# Patient Record
Sex: Female | Born: 1975 | Race: Black or African American | Hispanic: No | Marital: Married | Smoking: Current every day smoker
Health system: Southern US, Community
[De-identification: ages and names within clinical notes are randomized; demographics above are authoritative.]

## PROBLEM LIST (undated history)

## (undated) DIAGNOSIS — M549 Dorsalgia, unspecified: Secondary | ICD-10-CM

## (undated) DIAGNOSIS — N200 Calculus of kidney: Secondary | ICD-10-CM

## (undated) DIAGNOSIS — N189 Chronic kidney disease, unspecified: Secondary | ICD-10-CM

## (undated) DIAGNOSIS — D573 Sickle-cell trait: Secondary | ICD-10-CM

## (undated) DIAGNOSIS — I10 Essential (primary) hypertension: Secondary | ICD-10-CM

## (undated) DIAGNOSIS — F32A Depression, unspecified: Secondary | ICD-10-CM

## (undated) DIAGNOSIS — R7303 Prediabetes: Secondary | ICD-10-CM

## (undated) DIAGNOSIS — S73006A Unspecified dislocation of unspecified hip, initial encounter: Secondary | ICD-10-CM

## (undated) DIAGNOSIS — J45909 Unspecified asthma, uncomplicated: Secondary | ICD-10-CM

## (undated) DIAGNOSIS — K219 Gastro-esophageal reflux disease without esophagitis: Secondary | ICD-10-CM

## (undated) DIAGNOSIS — G43909 Migraine, unspecified, not intractable, without status migrainosus: Secondary | ICD-10-CM

## (undated) HISTORY — PX: OTHER SURGICAL HISTORY: SHX169

## (undated) HISTORY — DX: Calculus of kidney: N20.0

## (undated) HISTORY — PX: TUBAL LIGATION: SHX77

## (undated) HISTORY — DX: Gastro-esophageal reflux disease without esophagitis: K21.9

## (undated) HISTORY — DX: Migraine, unspecified, not intractable, without status migrainosus: G43.909

## (undated) HISTORY — DX: Sickle-cell trait: D57.3

---

## 2004-12-28 ENCOUNTER — Emergency Department: Payer: Self-pay | Admitting: Emergency Medicine

## 2005-01-25 ENCOUNTER — Emergency Department: Payer: Self-pay | Admitting: Emergency Medicine

## 2005-02-09 ENCOUNTER — Emergency Department: Payer: Self-pay | Admitting: Emergency Medicine

## 2005-03-09 ENCOUNTER — Emergency Department: Payer: Self-pay | Admitting: Emergency Medicine

## 2005-05-22 ENCOUNTER — Emergency Department: Payer: Self-pay | Admitting: Emergency Medicine

## 2005-07-27 ENCOUNTER — Emergency Department: Payer: Self-pay | Admitting: Emergency Medicine

## 2005-09-10 ENCOUNTER — Emergency Department: Payer: Self-pay | Admitting: Emergency Medicine

## 2005-09-25 ENCOUNTER — Emergency Department: Payer: Self-pay | Admitting: Emergency Medicine

## 2005-10-25 ENCOUNTER — Emergency Department: Payer: Self-pay | Admitting: Emergency Medicine

## 2005-12-05 ENCOUNTER — Other Ambulatory Visit: Payer: Self-pay

## 2005-12-05 ENCOUNTER — Emergency Department: Payer: Self-pay | Admitting: General Practice

## 2006-04-28 ENCOUNTER — Emergency Department: Payer: Self-pay | Admitting: Emergency Medicine

## 2006-08-16 ENCOUNTER — Emergency Department: Payer: Self-pay | Admitting: Emergency Medicine

## 2006-08-17 ENCOUNTER — Emergency Department: Payer: Self-pay | Admitting: Emergency Medicine

## 2007-05-03 ENCOUNTER — Emergency Department: Payer: Self-pay | Admitting: Emergency Medicine

## 2007-09-20 ENCOUNTER — Emergency Department: Payer: Self-pay | Admitting: Emergency Medicine

## 2008-01-28 ENCOUNTER — Emergency Department: Payer: Self-pay | Admitting: Emergency Medicine

## 2008-10-15 ENCOUNTER — Emergency Department: Payer: Self-pay | Admitting: Emergency Medicine

## 2008-10-28 ENCOUNTER — Observation Stay: Payer: Self-pay | Admitting: Internal Medicine

## 2009-07-29 ENCOUNTER — Ambulatory Visit: Payer: Self-pay | Admitting: Oncology

## 2009-12-01 ENCOUNTER — Emergency Department: Payer: Self-pay | Admitting: Unknown Physician Specialty

## 2010-07-24 ENCOUNTER — Emergency Department: Payer: Self-pay | Admitting: Internal Medicine

## 2010-09-11 ENCOUNTER — Emergency Department: Payer: Self-pay | Admitting: Emergency Medicine

## 2012-04-20 ENCOUNTER — Emergency Department: Payer: Self-pay | Admitting: Emergency Medicine

## 2012-04-20 LAB — URINALYSIS, COMPLETE
Bacteria: NONE SEEN
Blood: NEGATIVE
Glucose,UR: NEGATIVE mg/dL (ref 0–75)
Leukocyte Esterase: NEGATIVE
Nitrite: NEGATIVE
Ph: 5 (ref 4.5–8.0)
Specific Gravity: 1.02 (ref 1.003–1.030)
Squamous Epithelial: 5

## 2013-06-16 ENCOUNTER — Emergency Department: Payer: Self-pay | Admitting: Emergency Medicine

## 2014-06-27 ENCOUNTER — Emergency Department (HOSPITAL_COMMUNITY): Payer: Self-pay

## 2014-06-27 ENCOUNTER — Emergency Department (HOSPITAL_COMMUNITY)
Admission: EM | Admit: 2014-06-27 | Discharge: 2014-06-27 | Disposition: A | Discharge status: Home / Self Care | Payer: Self-pay | Attending: Emergency Medicine | Admitting: Emergency Medicine

## 2014-06-27 ENCOUNTER — Encounter (HOSPITAL_COMMUNITY): Payer: Self-pay | Admitting: *Deleted

## 2014-06-27 DIAGNOSIS — R0602 Shortness of breath: Secondary | ICD-10-CM

## 2014-06-27 DIAGNOSIS — I10 Essential (primary) hypertension: Secondary | ICD-10-CM | POA: Insufficient documentation

## 2014-06-27 DIAGNOSIS — M791 Myalgia: Secondary | ICD-10-CM | POA: Insufficient documentation

## 2014-06-27 DIAGNOSIS — Z720 Tobacco use: Secondary | ICD-10-CM | POA: Insufficient documentation

## 2014-06-27 DIAGNOSIS — Z7951 Long term (current) use of inhaled steroids: Secondary | ICD-10-CM | POA: Insufficient documentation

## 2014-06-27 DIAGNOSIS — Z3202 Encounter for pregnancy test, result negative: Secondary | ICD-10-CM | POA: Insufficient documentation

## 2014-06-27 DIAGNOSIS — M7918 Myalgia, other site: Secondary | ICD-10-CM

## 2014-06-27 DIAGNOSIS — Z91148 Patient's other noncompliance with medication regimen for other reason: Secondary | ICD-10-CM

## 2014-06-27 DIAGNOSIS — Z9114 Patient's other noncompliance with medication regimen: Secondary | ICD-10-CM | POA: Insufficient documentation

## 2014-06-27 DIAGNOSIS — J45901 Unspecified asthma with (acute) exacerbation: Secondary | ICD-10-CM | POA: Insufficient documentation

## 2014-06-27 DIAGNOSIS — Z79899 Other long term (current) drug therapy: Secondary | ICD-10-CM | POA: Insufficient documentation

## 2014-06-27 DIAGNOSIS — Z791 Long term (current) use of non-steroidal anti-inflammatories (NSAID): Secondary | ICD-10-CM | POA: Insufficient documentation

## 2014-06-27 HISTORY — DX: Unspecified asthma, uncomplicated: J45.909

## 2014-06-27 HISTORY — DX: Essential (primary) hypertension: I10

## 2014-06-27 LAB — BRAIN NATRIURETIC PEPTIDE: B Natriuretic Peptide: 40.3 pg/mL (ref 0.0–100.0)

## 2014-06-27 LAB — CBC
HEMATOCRIT: 29.9 % — AB (ref 36.0–46.0)
Hemoglobin: 8.3 g/dL — ABNORMAL LOW (ref 12.0–15.0)
MCH: 16.8 pg — ABNORMAL LOW (ref 26.0–34.0)
MCHC: 27.8 g/dL — ABNORMAL LOW (ref 30.0–36.0)
MCV: 60.6 fL — AB (ref 78.0–100.0)
PLATELETS: 487 10*3/uL — AB (ref 150–400)
RBC: 4.93 MIL/uL (ref 3.87–5.11)
RDW: 20.2 % — AB (ref 11.5–15.5)
WBC: 10.8 10*3/uL — AB (ref 4.0–10.5)

## 2014-06-27 LAB — BASIC METABOLIC PANEL
Anion gap: 7 (ref 5–15)
BUN: 13 mg/dL (ref 6–23)
CO2: 24 mmol/L (ref 19–32)
Calcium: 8.9 mg/dL (ref 8.4–10.5)
Chloride: 109 mmol/L (ref 96–112)
Creatinine, Ser: 0.8 mg/dL (ref 0.50–1.10)
GFR calc Af Amer: >90 mL/min (ref >90)
GFR calc non Af Amer: >90 mL/min (ref >90)
GLUCOSE: 112 mg/dL — AB (ref 70–99)
Potassium: 3.8 mmol/L (ref 3.5–5.1)
Sodium: 140 mmol/L (ref 135–145)

## 2014-06-27 LAB — POC URINE PREG, ED: PREG TEST UR: NEGATIVE

## 2014-06-27 LAB — I-STAT TROPONIN, ED: Troponin i, poc: 0 ng/mL (ref 0.00–0.08)

## 2014-06-27 MED ORDER — HYDROCODONE-ACETAMINOPHEN 5-325 MG PO TABS: 1.0000 | ORAL_TABLET | ORAL | Status: DC | PRN

## 2014-06-27 MED ORDER — ALBUTEROL SULFATE (2.5 MG/3ML) 0.083% IN NEBU: 2.5000 mg | INHALATION_SOLUTION | Freq: Four times a day (QID) | RESPIRATORY_TRACT | Status: DC | PRN

## 2014-06-27 MED ORDER — AMLODIPINE BESYLATE 10 MG PO TABS: 10.0000 mg | ORAL_TABLET | Freq: Every day | ORAL | Status: DC

## 2014-06-27 MED ORDER — MONTELUKAST SODIUM 10 MG PO TABS: 10.0000 mg | ORAL_TABLET | Freq: Every day | ORAL | Status: DC

## 2014-06-27 MED ORDER — HYDROCODONE-ACETAMINOPHEN 5-325 MG PO TABS
1.0000 | ORAL_TABLET | Freq: Once | ORAL | Status: AC
  Administered 2014-06-27: 1 via ORAL
  Filled 2014-06-27: qty 1

## 2014-06-27 MED ORDER — LISINOPRIL 10 MG PO TABS: 10.0000 mg | ORAL_TABLET | Freq: Every day | ORAL | Status: DC

## 2014-06-27 NOTE — Discharge Instructions (Signed)
There is no clear cause, for your pain. You may have a form of arthritis. This type of pain usually gets better with things like ibuprofen or Naprosyn. You may need further testing by a primary care doctor to determine the cause of your pain. You can also use our resource guide, to help you find a doctor.   Musculoskeletal Pain Musculoskeletal pain is muscle and boney aches and pains. These pains can occur in any part of the body. Your caregiver may treat you without knowing the cause of the pain. They may treat you if blood or urine tests, X-rays, and other tests were normal.  CAUSES There is often not a definite cause or reason for these pains. These pains may be caused by a type of germ (virus). The discomfort may also come from overuse. Overuse includes working out too hard when your body is not fit. Boney aches also come from weather changes. Bone is sensitive to atmospheric pressure changes. HOME CARE INSTRUCTIONS   Ask when your test results will be ready. Make sure you get your test results.  Only take over-the-counter or prescription medicines for pain, discomfort, or fever as directed by your caregiver. If you were given medications for your condition, do not drive, operate machinery or power tools, or sign legal documents for 24 hours. Do not drink alcohol. Do not take sleeping pills or other medications that may interfere with treatment.  Continue all activities unless the activities cause more pain. When the pain lessens, slowly resume normal activities. Gradually increase the intensity and duration of the activities or exercise.  During periods of severe pain, bed rest may be helpful. Lay or sit in any position that is comfortable.  Putting ice on the injured area.  Put ice in a bag.  Place a towel between your skin and the bag.  Leave the ice on for 15 to 20 minutes, 3 to 4 times a day.  Follow up with your caregiver for continued problems and no reason can be found for the  pain. If the pain becomes worse or does not go away, it may be necessary to repeat tests or do additional testing. Your caregiver may need to look further for a possible cause. SEEK IMMEDIATE MEDICAL CARE IF:  You have pain that is getting worse and is not relieved by medications.  You develop chest pain that is associated with shortness or breath, sweating, feeling sick to your stomach (nauseous), or throw up (vomit).  Your pain becomes localized to the abdomen.  You develop any new symptoms that seem different or that concern you. MAKE SURE YOU:   Understand these instructions.  Will watch your condition.  Will get help right away if you are not doing well or get worse. Document Released: 03/15/2005 Document Revised: 06/07/2011 Document Reviewed: 11/17/2012 [facility] Patient Information 2015 [place], [place]. This information is not intended to replace advice given to you by your health care provider. Make sure you discuss any questions you have with your health care provider.    Emergency Department Resource Guide 1) Find a Doctor and Pay Out of Pocket Although you won't have to find out who is covered by your insurance plan, it is a good idea to ask around and get recommendations. You will then need to call the office and see if the doctor you have chosen will accept you as a new patient and what types of options they offer for patients who are self-pay. Some doctors offer discounts or will set  up payment plans for their patients who do not have insurance, but you will need to ask so you aren't surprised when you get to your appointment.  2) Contact Your Local Health Department Not all health departments have doctors that can see patients for sick visits, but many do, so it is worth a call to see if yours does. If you don't know where your local health department is, you can check in your phone book. The CDC also has a tool to help you locate your state's health department, and many  state websites also have listings of all of their local health departments.  3) Find a [facility] If your illness is not likely to be very severe or complicated, you may want to try a walk in clinic. These are popping up all over the country in pharmacies, drugstores, and shopping centers. They're usually staffed by nurse practitioners or physician assistants that have been trained to treat common illnesses and complaints. They're usually fairly quick and inexpensive. However, if you have serious medical issues or chronic medical problems, these are probably not your best option.  No Primary Care Doctor: - Call Health Connect at  [phone] - they can help you locate a primary care doctor that  accepts your insurance, provides certain services, etc. - Physician Referral Service- [phone]  Chronic Pain Problems: Organization         Address  Phone   Notes  [facility]  [phone] Patients need to be referred by their primary care doctor.   Medication Assistance: Organization         Address  Phone   Notes  [facility] Medication [facility] [address] [place], [place] [address] [phone] --Must be a resident of [facility] -- Must have NO insurance coverage whatsoever (no Medicaid/ Medicare, etc.) -- The pt. MUST have a primary care doctor that directs their care regularly and follows them in the community   MedAssist  [phone]   [employer]  [phone]    Agencies that provide inexpensive medical care: Organization         Address  Phone   Notes  [place]  [phone]   [facility]    [phone]   [facility] [place], [place] [address] [phone]   [place] [address], [place] [phone]   Planned Parenthood    [phone]   [facility]    [phone]   [place] and  [address], [place] Phone:  [phone], Fax:  [phone] Hours of Operation:  9 am - 6 pm, M-F.  Also accepts Medicaid/Medicare and self-pay.  [facility] [place] for [place] [place], [address], [place] Phone: [phone], Fax: [phone]. Hours of Operation:  8:30 am - 5:30 pm, M-F.  Also accepts Medicaid and self-pay.  [facility] [address], [place] Phone: [phone]   [place], [place], [place] [phone], Ext. [phone] Mondays & Thursdays: 7-9 AM.  First 15 patients are seen on a first come, first serve basis.    [place] Providers:  Organization         Address  Phone   Notes  [facility] [address], [place] [phone] Also  accepts self-pay patients.  [facility] [address], [place]  [phone]   [place], [address], [place] [phone]   [facility] [address], [place] [phone]   Lucianne Lei [address], [place]   [phone] Only accepts [place] Access [place] patients after they have their name applied to their card.   Self-Pay (no insurance) in [facility]:  Organization         Address  Phone   Notes  Sickle Cell Patients, [facility] [place] [phone]   [facility] [place] [phone]   [facility] [place]  [place], [place], [place] [phone]   [facility]/Dr. Osei-Bonsu  [address] [place] or [address], [place] [phone] Phone number for both [place] and [place] locations is the same.  Urgent Medical and [facility] [address], [place] [phone]   [facility] [address] [place] or [address] [phone]   [facility] [address], [place] [phone], phone; [phone], fax Sees patients 1st and 3rd Saturday of every month.  Must not qualify for public or private insurance (i.e. Medicaid, Medicare, [place] Health Choice, Veterans' Benefits)  Household income should be no more than 200% of the poverty level The clinic cannot treat you if you are pregnant or think you are pregnant  Sexually transmitted diseases are not treated at the clinic.    Dental Care: Organization         Address  Phone  Notes  [facility] Department of [facility] [phone] Accepts children up to age 56 who are enrolled in [place] or [place]; pregnant women with a Medicaid card; and children who have applied for Medicaid or [place] Health Choice, but were declined, whose parents can pay a reduced fee at time of service.  [facility] Department of [facility]  [address], [place] [phone] Accepts children up to age 46 who are enrolled in [place] or [place]; pregnant women with a Medicaid card; and children who have applied for Medicaid or [place] Health Choice, but were declined, whose parents can pay a reduced fee at time of service.  [facility] Access PRO[facility]  [place] [phone] Patients are seen by appointment only. Walk-ins are not accepted. [place] will see patients 30 years of age and older. Monday - Tuesday (8am-5pm) Most Wednesdays (8:30-5pm) $30 per visit, cash only  [facility] Adult Dental Access PROGRAM  [address], [facility] [phone] Patients are seen by appointment only. Walk-ins are not accepted. [place] will see patients 46 years of age and older. One Wednesday Evening (Monthly: Volunteer Based).  $30 per visit, cash only  [place]  [phone] for adults; Children under age 37, call Graduate Pediatric Dentistry at [phone]. Children aged 59-14, please call [phone] to request a pediatric application.  Dental services are provided in all areas of dental care including fillings, crowns and bridges, complete and partial dentures, implants, gum treatment, root canals, and extractions. Preventive care is also provided. Treatment is  provided to both adults and children. Patients are selected via a lottery and there is often a waiting list.   [facility] [address], [place]  [phone] [email or web address]   [facility] [address] [employer], [place] [phone], Ext. [phone] Second and Fourth Thursday of each month, opens at 6:30 AM; Clinic ends at 9 AM.  Patients are seen on a first-come first-served basis, and a limited number are seen during each clinic.   [facility]  [address] Hillard Danker [place], [place] [phone]   Eligibility Requirements You must have lived in [place], [place], or [place] counties for at least the last three months.   You cannot be eligible for state or federal sponsored [employer], including [employer], [place], or [employer].   You generally cannot be eligible for healthcare insurance through your employer.    How to apply: Eligibility screenings are held every Tuesday and Wednesday afternoon from 1:00 pm until 4:00 pm. You do not need an appointment for the interview!  [facility] [address] [place], [place]   [place]  [place] Department  [address]  [phone]    Behavioral Health Resources in the Community: Intensive Outpatient Programs Organization         Address  Phone  Notes  [place]. [address], [place], [place]  [phone]   [facility] Outpatient [address] [place], [place]   ADS: Alcohol & Drug Svcs [address] [place], [place]   [place] [address]  [place], [place] or [phone]   Substance Abuse Resources Organization         Address  Phone  Notes  Alcohol and Drug Services  [phone]   [place]  [phone]   [facility] [place]   Chinita Pester  [phone]   Residential & Outpatient Substance Abuse Program  [phone]   Psychological Services Organization         Address  Phone  Notes  [facility]  [place]  [phone]   [place] [address] [place] or [phone]    Mobile Crisis Teams Organization         Address  Phone  Notes  [facility], Mobile Crisis Care Unit  [phone]   [facility]  [address] [place], [place]   [place] [address], [place] [phone]    Self-Help/Support Groups Organization         Address  Phone             Notes  [place]. of [place] - variety of support groups  [place] Call for more information  Narcotics Anonymous (NA), [facility] [address], [employer] [place]  2 meetings at this location   Special educational needs teacher         Address  Phone  Notes  ASAP Residential Treatment [place],    [place]  [phone]   [facility]  [address], [place] [phone], [place], [place]   [place], [place] [phone] Admissions: 8am-3pm M-F  Incentives Substance [place] [address],    [place], [place] [phone]  [facility] [address] Jadene Pierini [place], [place]   The [place].,    [place], [place]   [facility] - Intensive Outpatient [address], [place], [place]   [facility] ([place] [address]  [place], [place] [phone] or [phone]   [facility]idential Treatment Services (RTS) [address], [place], [place] Accepts Medicaid  [facility] [place] [address]  [place] [phone] Substance Abuse/Addiction Treatment   [facility] Organization         Address  Phone  Notes  [facility]  [phone]   Domenic Schwab, PhD [address] Arlis Porta [place], [place]   [phone] or [phone]   [place], [place] [phone]   [facility] [address] [place], [place] [phone] Insurance/Medicaid/sponsorship through [facility] and Families [address], [address] [place]                                    [place], [place] [phone] [place] [address], [place] [phone]    Dr. Adele Schilder  [phone]   [facility] [facility] Dept. 1) [address] [place] 2) [place] 3)  [place] [address], [place] [phone]  [phone]   [place] [phone] or [phone] (After Hours)

## 2014-06-27 NOTE — ED Notes (Signed)
Pt is aware a urine sample is needed. Pt will notify when she is able to void

## 2014-06-27 NOTE — ED Provider Notes (Addendum)
CSN: [id number]     Arrival date & time 06/27/14  1225 History   First MD Initiated Contact with Patient 06/27/14 1505     Chief Complaint  Patient presents with  . Chest Pain  . Shortness of Breath     (Consider location/radiation/quality/duration/timing/severity/associated sxs/prior Treatment) HPI   Kelly Byrd is a 39 y.o. female who is here for "several problems". She is out of multiple medications. She also has chronic ongoing pain in right knee, back, and headache. She denies recent fever, chills, nausea, vomiting, weakness or dizziness. She recently moved to [place], from [place], [place]. She does not have a primary care doctor currently. She is out of the following medications- albuterol nebulizer, amlodipine, Flonase, lisinopril, Singulair, naproxen.    Past Medical History  Diagnosis Date  . Asthma   . Hypertension    Past Surgical History  Procedure Laterality Date  . Cesarean section     No family history on file. History  Substance Use Topics  . Smoking status: Current Every Day Smoker    Types: Cigarettes  . Smokeless tobacco: Not on file  . Alcohol Use: No   OB History    No data available     Review of Systems  All other systems reviewed and are negative.     Allergies  Review of patient's allergies indicates no known allergies.  Home Medications   Prior to Admission medications   Medication Sig Start Date End Date Taking? Authorizing Provider  acetaminophen (TYLENOL) 650 MG CR tablet Take 650 mg by mouth every 8 (eight) hours as needed for pain (pain).   Yes Historical Provider, MD  albuterol (PROVENTIL HFA;VENTOLIN HFA) 108 (90 BASE) MCG/ACT inhaler Inhale 2 puffs into the lungs every 6 (six) hours as needed for wheezing or shortness of breath (wheezing).   Yes Historical Provider, MD  albuterol (PROVENTIL) (2.5 MG/3ML) 0.083% nebulizer solution Take 2.5 mg by nebulization every 6 (six) hours as needed for wheezing or  shortness of breath (wheezing).   Yes Historical Provider, MD  amLODipine (NORVASC) 10 MG tablet Take 10 mg by mouth daily.   Yes Historical Provider, MD  guaifenesin (ROBITUSSIN) 100 MG/5ML syrup Take 200 mg by mouth 3 (three) times daily as needed for cough (cough).   Yes Historical Provider, MD  ibuprofen (ADVIL,MOTRIN) 800 MG tablet Take 800 mg by mouth every 8 (eight) hours as needed for moderate pain (pain).   Yes Historical Provider, MD  lisinopril (PRINIVIL,ZESTRIL) 10 MG tablet Take 10 mg by mouth daily.   Yes Historical Provider, MD  mometasone-formoterol (DULERA) 200-5 MCG/ACT AERO Inhale 2 puffs into the lungs 2 (two) times daily.   Yes Historical Provider, MD  montelukast (SINGULAIR) 10 MG tablet Take 10 mg by mouth at bedtime.   Yes Historical Provider, MD  naproxen (NAPROSYN) 500 MG tablet Take 1,000 mg by mouth 4 (four) times daily as needed for moderate pain (pain).   Yes Historical Provider, MD   BP 128/81 mmHg  Pulse 93  Temp(Src) 98.2 F (36.8 C) (Oral)  Resp 20  Ht 5\' 4"  (1.626 m)  Wt 220 lb (99.791 kg)  BMI 37.74 kg/m2  SpO2 100%  LMP 06/24/2014 Physical Exam  Constitutional: She is oriented to person, place, and time. She appears well-developed and well-nourished.  HENT:  Head: Normocephalic and atraumatic.  Right Ear: External ear normal.  Left Ear: External ear normal.  Eyes: Conjunctivae and EOM are normal. Pupils are equal, round, and reactive to light.  Neck: Normal range of motion and phonation normal. Neck supple.  Cardiovascular: Normal rate, regular rhythm and normal heart sounds.   Pulmonary/Chest: Effort normal and breath sounds normal. She exhibits no bony tenderness.  Abdominal: Soft. There is no tenderness.  Musculoskeletal: Normal range of motion.  Neurological: She is alert and oriented to person, place, and time. No cranial nerve deficit or sensory deficit. She exhibits normal muscle tone. Coordination normal.  Skin: Skin is warm, dry and  intact.  Psychiatric: She has a normal mood and affect. Her behavior is normal. Judgment and thought content normal.  Nursing note and vitals reviewed.   ED Course  Procedures (including critical care time) Labs Review Labs Reviewed  BASIC METABOLIC PANEL - Abnormal; Notable for the following:    Glucose, Bld 112 (*)    All other components within normal limits  CBC - Abnormal; Notable for the following:    WBC 10.8 (*)    Hemoglobin 8.3 (*)    HCT 29.9 (*)    MCV 60.6 (*)    MCH 16.8 (*)    MCHC 27.8 (*)    RDW 20.2 (*)    Platelets 487 (*)    All other components within normal limits  BRAIN NATRIURETIC PEPTIDE  POC URINE PREG, ED  I-STAT TROPOININ, ED    Imaging Review Dg Chest 2 View  06/27/2014   CLINICAL DATA:  Left-sided chest, neck, and back pain for 3-4 days. Shortness of breath with activity. Productive cough.  EXAM: CHEST  2 VIEW  COMPARISON:  None.  FINDINGS: The heart size and mediastinal contours are within normal limits. Both lungs are clear. The visualized skeletal structures are unremarkable.  IMPRESSION: Normal chest.   Electronically Signed   By: Lorriane Shire M.D.   On: 06/27/2014 13:31     EKG Interpretation   Date/Time:  Thursday June 27 2014 12:41:33 EDT Ventricular Rate:  91 PR Interval:  157 QRS Duration: 79 QT Interval:  371 QTC Calculation: 456 R Axis:   52 Text Interpretation:  Sinus rhythm Consider left ventricular hypertrophy  ST elevation, consider inferior injury No old tracing to compare Confirmed  by [facility]  MD, Tyress Loden [phone]) on 06/27/2014 3:09:20 PM      MDM   Final diagnoses:  SOB (shortness of breath)    Nonspecific musculoskeletal pain. Doubt gout, fibromyalgia, serious bacterial infection, metabolic instability, or impending vascular collapse.  Nursing Notes Reviewed/ Care Coordinated Applicable Imaging Reviewed Interpretation of Laboratory Data incorporated into ED treatment  The patient appears reasonably screened  and/or stabilized for discharge and I doubt any other medical condition or other [facility] requiring further screening, evaluation, or treatment in the ED at this time prior to discharge.  Plan: Home Medications- see AVS; Home Treatments- rest; return here if the recommended treatment, does not improve the symptoms; Recommended follow up- PCP of choice asap      Daleen Bo, MD 06/27/14 [place], MD 07/04/14 2152

## 2014-06-27 NOTE — ED Notes (Signed)
Patient states that she has been having pain in the left side of her chest, neck, and back for the past 3-4 days. She states she has history of asthma and sometimes has pains in her chest but this pain is different in location. She states she has been having shortness of breath with activity and a productive cough this week. She denies radiation to the jaw or arms.

## 2014-09-28 ENCOUNTER — Emergency Department (HOSPITAL_COMMUNITY): Payer: Self-pay

## 2014-09-28 ENCOUNTER — Emergency Department (HOSPITAL_COMMUNITY)
Admission: EM | Admit: 2014-09-28 | Discharge: 2014-09-28 | Disposition: A | Discharge status: Home / Self Care | Payer: Self-pay | Attending: Emergency Medicine | Admitting: Emergency Medicine

## 2014-09-28 ENCOUNTER — Encounter (HOSPITAL_COMMUNITY): Payer: Self-pay | Admitting: Emergency Medicine

## 2014-09-28 DIAGNOSIS — Z720 Tobacco use: Secondary | ICD-10-CM | POA: Insufficient documentation

## 2014-09-28 DIAGNOSIS — I10 Essential (primary) hypertension: Secondary | ICD-10-CM | POA: Insufficient documentation

## 2014-09-28 DIAGNOSIS — R079 Chest pain, unspecified: Secondary | ICD-10-CM | POA: Insufficient documentation

## 2014-09-28 DIAGNOSIS — Z79899 Other long term (current) drug therapy: Secondary | ICD-10-CM | POA: Insufficient documentation

## 2014-09-28 DIAGNOSIS — J45909 Unspecified asthma, uncomplicated: Secondary | ICD-10-CM | POA: Insufficient documentation

## 2014-09-28 DIAGNOSIS — Z7951 Long term (current) use of inhaled steroids: Secondary | ICD-10-CM | POA: Insufficient documentation

## 2014-09-28 LAB — CBC WITH DIFFERENTIAL/PLATELET
Basophils Absolute: 0 10*3/uL (ref 0.0–0.1)
Basophils Relative: 0 % (ref 0–1)
EOS ABS: 0.6 10*3/uL (ref 0.0–0.7)
EOS PCT: 5 % (ref 0–5)
HCT: 28.8 % — ABNORMAL LOW (ref 36.0–46.0)
HEMOGLOBIN: 8 g/dL — AB (ref 12.0–15.0)
LYMPHS PCT: 23 % (ref 12–46)
Lymphs Abs: 2.6 10*3/uL (ref 0.7–4.0)
MCH: 16.8 pg — ABNORMAL LOW (ref 26.0–34.0)
MCHC: 27.8 g/dL — AB (ref 30.0–36.0)
MCV: 60.4 fL — ABNORMAL LOW (ref 78.0–100.0)
MONO ABS: 0.6 10*3/uL (ref 0.1–1.0)
MONOS PCT: 5 % (ref 3–12)
NEUTROS ABS: 7.5 10*3/uL (ref 1.7–7.7)
Neutrophils Relative %: 67 % (ref 43–77)
PLATELETS: 463 10*3/uL — AB (ref 150–400)
RBC: 4.77 MIL/uL (ref 3.87–5.11)
RDW: 19.3 % — AB (ref 11.5–15.5)
WBC: 11.3 10*3/uL — AB (ref 4.0–10.5)

## 2014-09-28 LAB — BASIC METABOLIC PANEL
ANION GAP: 7 (ref 5–15)
BUN: 8 mg/dL (ref 6–20)
CO2: 24 mmol/L (ref 22–32)
Calcium: 8.7 mg/dL — ABNORMAL LOW (ref 8.9–10.3)
Chloride: 106 mmol/L (ref 101–111)
Creatinine, Ser: 0.6 mg/dL (ref 0.44–1.00)
GFR calc non Af Amer: >60 mL/min (ref >60)
GLUCOSE: 87 mg/dL (ref 65–99)
POTASSIUM: 3.5 mmol/L (ref 3.5–5.1)
Sodium: 137 mmol/L (ref 135–145)

## 2014-09-28 LAB — TROPONIN I: Troponin I: <0.03 ng/mL (ref <0.031)

## 2014-09-28 MED ORDER — AZITHROMYCIN 250 MG PO TABS: ORAL_TABLET | ORAL | Status: DC

## 2014-09-28 MED ORDER — PREDNISONE 50 MG PO TABS: ORAL_TABLET | ORAL | Status: DC

## 2014-09-28 MED ORDER — IPRATROPIUM-ALBUTEROL 0.5-2.5 (3) MG/3ML IN SOLN
3.0000 mL | Freq: Once | RESPIRATORY_TRACT | Status: AC
  Administered 2014-09-28: 3 mL via RESPIRATORY_TRACT
  Filled 2014-09-28: qty 3

## 2014-09-28 MED ORDER — OXYCODONE-ACETAMINOPHEN 5-325 MG PO TABS: 1.0000 | ORAL_TABLET | Freq: Four times a day (QID) | ORAL | Status: DC | PRN

## 2014-09-28 MED ORDER — ALBUTEROL SULFATE (2.5 MG/3ML) 0.083% IN NEBU
2.5000 mg | INHALATION_SOLUTION | Freq: Once | RESPIRATORY_TRACT | Status: AC
  Administered 2014-09-28: 2.5 mg via RESPIRATORY_TRACT
  Filled 2014-09-28: qty 3

## 2014-09-28 MED ORDER — KETOROLAC TROMETHAMINE 30 MG/ML IJ SOLN
30.0000 mg | Freq: Once | INTRAMUSCULAR | Status: AC
  Administered 2014-09-28: 30 mg via INTRAVENOUS
  Filled 2014-09-28: qty 1

## 2014-09-28 MED ORDER — ONDANSETRON HCL 4 MG/2ML IJ SOLN
4.0000 mg | Freq: Once | INTRAMUSCULAR | Status: AC
  Administered 2014-09-28: 4 mg via INTRAVENOUS
  Filled 2014-09-28: qty 2

## 2014-09-28 MED ORDER — FENTANYL CITRATE (PF) 100 MCG/2ML IJ SOLN
100.0000 ug | Freq: Once | INTRAMUSCULAR | Status: AC
Start: 2014-09-28 — End: 2014-09-28
  Administered 2014-09-28: 100 ug via INTRAVENOUS
  Filled 2014-09-28: qty 2

## 2014-09-28 MED ORDER — SODIUM CHLORIDE 0.9 % IV BOLUS (SEPSIS)
500.0000 mL | Freq: Once | INTRAVENOUS | Status: AC
  Administered 2014-09-28: 500 mL via INTRAVENOUS

## 2014-09-28 NOTE — ED Provider Notes (Signed)
CSN: [id number]     Arrival date & time 09/28/14  1159 History   First MD Initiated Contact with Patient 09/28/14 1232     Chief Complaint  Patient presents with  . Chest Pain     (Consider location/radiation/quality/duration/timing/severity/associated sxs/prior Treatment) HPI..... Left-sided chest pain with radiation to the back for 3 days, worse with coughing. Patient has asthma and has tried a home nebulizer treatment  with minimal success. She has hypertension but no diabetes. She quit smoking 2 weeks ago. No family history of coronary disease. No dyspnea, diaphoresis, nausea.  Past Medical History  Diagnosis Date  . Asthma   . Hypertension    Past Surgical History  Procedure Laterality Date  . Cesarean section     History reviewed. No pertinent family history. History  Substance Use Topics  . Smoking status: Current Every Day Smoker    Types: Cigarettes  . Smokeless tobacco: Not on file  . Alcohol Use: No   OB History    No data available     Review of Systems  All other systems reviewed and are negative.     Allergies  Review of patient's allergies indicates no known allergies.  Home Medications   Prior to Admission medications   Medication Sig Start Date End Date Taking? Authorizing Provider  albuterol (PROVENTIL HFA;VENTOLIN HFA) 108 (90 BASE) MCG/ACT inhaler Inhale 2 puffs into the lungs every 6 (six) hours as needed for wheezing or shortness of breath (wheezing).   Yes Historical Provider, MD  albuterol (PROVENTIL) (2.5 MG/3ML) 0.083% nebulizer solution Take 3 mLs (2.5 mg total) by nebulization every 6 (six) hours as needed for wheezing or shortness of breath (wheezing). 06/27/14  Yes Daleen Bo, MD  amLODipine (NORVASC) 10 MG tablet Take 1 tablet (10 mg total) by mouth daily. 06/27/14  Yes Daleen Bo, MD  ibuprofen (ADVIL,MOTRIN) 800 MG tablet Take 800 mg by mouth every 8 (eight) hours as needed for moderate pain (pain).   Yes Historical Provider, MD   mometasone-formoterol (DULERA) 200-5 MCG/ACT AERO Inhale 2 puffs into the lungs 2 (two) times daily.   Yes Historical Provider, MD  montelukast (SINGULAIR) 10 MG tablet Take 1 tablet (10 mg total) by mouth at bedtime. 06/27/14  Yes Daleen Bo, MD  azithromycin (ZITHROMAX Z-PAK) 250 MG tablet 2 tablets day 1;    1 tablet day 2 through 5 09/28/14   Nat Christen, MD  HYDROcodone-acetaminophen [facility]) 5-325 MG per tablet Take 1 tablet by mouth every 4 (four) hours as needed. 06/27/14   Daleen Bo, MD  lisinopril (PRINIVIL,ZESTRIL) 10 MG tablet Take 1 tablet (10 mg total) by mouth daily. Patient not taking: Reported on 09/28/2014 06/27/14   Daleen Bo, MD  oxyCODONE-acetaminophen (PERCOCET/ROXICET) 5-325 MG per tablet Take 1-2 tablets by mouth every 6 (six) hours as needed. 09/28/14   Nat Christen, MD  predniSONE (DELTASONE) 50 MG tablet 1 tablet daily for 5 days, one half tablet daily for 5 days 09/28/14   Nat Christen, MD   BP 137/76 mmHg  Pulse 75  Temp(Src) 98.1 F (36.7 C) (Oral)  Resp 18  SpO2 99%  LMP 09/21/2014 (Exact Date) Physical Exam  Constitutional: She is oriented to person, place, and time. She appears well-developed and well-nourished.  HENT:  Head: Normocephalic and atraumatic.  Eyes: Conjunctivae and EOM are normal. Pupils are equal, round, and reactive to light.  Neck: Normal range of motion. Neck supple.  Cardiovascular: Normal rate and regular rhythm.   Pulmonary/Chest: Effort normal and breath  sounds normal.  Patient is tender under the left breast.  Abdominal: Soft. Bowel sounds are normal.  Musculoskeletal: Normal range of motion.  Neurological: She is alert and oriented to person, place, and time.  Skin: Skin is warm and dry.  Psychiatric: She has a normal mood and affect. Her behavior is normal.  Nursing note and vitals reviewed.   ED Course  Procedures (including critical care time) Labs Review Labs Reviewed  BASIC METABOLIC PANEL - Abnormal; Notable for the  following:    Calcium 8.7 (*)    All other components within normal limits  CBC WITH DIFFERENTIAL/PLATELET - Abnormal; Notable for the following:    WBC 11.3 (*)    Hemoglobin 8.0 (*)    HCT 28.8 (*)    MCV 60.4 (*)    MCH 16.8 (*)    MCHC 27.8 (*)    RDW 19.3 (*)    Platelets 463 (*)    All other components within normal limits  TROPONIN I    Imaging Review No results found.   EKG Interpretation   Date/Time:  Saturday September 28 2014 12:08:12 EDT Ventricular Rate:  93 PR Interval:  170 QRS Duration: 82 QT Interval:  391 QTC Calculation: 486 R Axis:   73 Text Interpretation:  Sinus rhythm Borderline prolonged QT interval  Confirmed by Lacinda Axon  MD, Aydenn Gervin ([id number]) on 09/28/2014 4:07:22 PM      MDM   Final diagnoses:  Chest pain, unspecified chest pain type    Patient is relatively low risk for ACS or PE. Screening Troponin, EKG, chest x-ray negative for acute findings. Patient is noted to be anemic with a hemoglobin of 8.0. She will get primary care follow-up or return if worse.   Nat Christen, MD 10/01/14 [phone]

## 2014-09-28 NOTE — ED Notes (Signed)
Pt escorted to discharge window. Verbalized understanding discharge instructions. In no acute distress.   

## 2014-09-28 NOTE — ED Notes (Signed)
Nurse currently starting IV 

## 2014-09-28 NOTE — Discharge Instructions (Signed)
Prescription for antibiotic, pain medicine, prednisone. Return if worse.

## 2014-09-28 NOTE — ED Notes (Addendum)
Pt reported lt chest pain (under breast) and back pain. Occ productive cough of greenish yellow sputum . Pt with SHOB and lightheadedness but no diaphoresis and n/v.Pt reported having an asthma attack Thursday and has been having pain since. Pt reported no relief from INH and neb tx.

## 2014-10-01 ENCOUNTER — Emergency Department: Payer: No Typology Code available for payment source

## 2014-10-01 ENCOUNTER — Emergency Department
Admission: EM | Admit: 2014-10-01 | Discharge: 2014-10-02 | Disposition: A | Discharge status: Home / Self Care | Payer: No Typology Code available for payment source | Attending: Emergency Medicine | Admitting: Emergency Medicine

## 2014-10-01 DIAGNOSIS — S39012A Strain of muscle, fascia and tendon of lower back, initial encounter: Secondary | ICD-10-CM | POA: Diagnosis not present

## 2014-10-01 DIAGNOSIS — Z720 Tobacco use: Secondary | ICD-10-CM | POA: Diagnosis not present

## 2014-10-01 DIAGNOSIS — Z3202 Encounter for pregnancy test, result negative: Secondary | ICD-10-CM | POA: Diagnosis not present

## 2014-10-01 DIAGNOSIS — S199XXA Unspecified injury of neck, initial encounter: Secondary | ICD-10-CM | POA: Diagnosis present

## 2014-10-01 DIAGNOSIS — S161XXA Strain of muscle, fascia and tendon at neck level, initial encounter: Secondary | ICD-10-CM | POA: Diagnosis not present

## 2014-10-01 DIAGNOSIS — Y998 Other external cause status: Secondary | ICD-10-CM | POA: Insufficient documentation

## 2014-10-01 DIAGNOSIS — Z79899 Other long term (current) drug therapy: Secondary | ICD-10-CM | POA: Insufficient documentation

## 2014-10-01 DIAGNOSIS — S29012A Strain of muscle and tendon of back wall of thorax, initial encounter: Secondary | ICD-10-CM | POA: Insufficient documentation

## 2014-10-01 DIAGNOSIS — Y9289 Other specified places as the place of occurrence of the external cause: Secondary | ICD-10-CM | POA: Diagnosis not present

## 2014-10-01 DIAGNOSIS — I10 Essential (primary) hypertension: Secondary | ICD-10-CM | POA: Insufficient documentation

## 2014-10-01 DIAGNOSIS — Y9389 Activity, other specified: Secondary | ICD-10-CM | POA: Diagnosis not present

## 2014-10-01 DIAGNOSIS — S8001XA Contusion of right knee, initial encounter: Secondary | ICD-10-CM | POA: Insufficient documentation

## 2014-10-01 DIAGNOSIS — S29019A Strain of muscle and tendon of unspecified wall of thorax, initial encounter: Secondary | ICD-10-CM
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
## 2014-10-02 ENCOUNTER — Emergency Department: Payer: No Typology Code available for payment source

## 2014-10-02 LAB — POCT PREGNANCY, URINE: Preg Test, Ur: NEGATIVE

## 2014-10-02 MED ORDER — CYCLOBENZAPRINE HCL 10 MG PO TABS
ORAL_TABLET | ORAL | Status: AC
  Filled 2014-10-02: qty 1

## 2014-10-02 MED ORDER — IBUPROFEN 800 MG PO TABS
800.0000 mg | ORAL_TABLET | Freq: Once | ORAL | Status: AC
  Administered 2014-10-02: 800 mg via ORAL

## 2014-10-02 MED ORDER — IBUPROFEN 800 MG PO TABS: 800.0000 mg | ORAL_TABLET | Freq: Three times a day (TID) | ORAL | Status: DC | PRN

## 2014-10-02 MED ORDER — IBUPROFEN 800 MG PO TABS
ORAL_TABLET | ORAL | Status: AC
  Administered 2014-10-02: 800 mg via ORAL
  Filled 2014-10-02: qty 1

## 2014-10-02 MED ORDER — CYCLOBENZAPRINE HCL 10 MG PO TABS: 10.0000 mg | ORAL_TABLET | Freq: Three times a day (TID) | ORAL | Status: DC | PRN

## 2014-10-02 MED ORDER — CYCLOBENZAPRINE HCL 10 MG PO TABS
10.0000 mg | ORAL_TABLET | Freq: Once | ORAL | Status: AC
  Administered 2014-10-02: 10 mg via ORAL
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
## 2014-10-02 NOTE — ED Notes (Signed)
pts BP is elevated at time of d/c.  Marylene Land, NP aware -  No additional orders.  Per Mendel Ryder pt was insructed to take her daily meds including BP meds when she gets home

## 2014-10-26 ENCOUNTER — Emergency Department: Payer: Self-pay

## 2014-10-26 ENCOUNTER — Encounter: Payer: Self-pay | Admitting: Emergency Medicine

## 2014-10-26 ENCOUNTER — Observation Stay
Admission: EM | Admit: 2014-10-26 | Discharge: 2014-10-29 | Disposition: A | Discharge status: Home / Self Care | Payer: Self-pay | Attending: Internal Medicine | Admitting: Internal Medicine

## 2014-10-26 DIAGNOSIS — R200 Anesthesia of skin: Secondary | ICD-10-CM | POA: Insufficient documentation

## 2014-10-26 DIAGNOSIS — W109XXA Fall (on) (from) unspecified stairs and steps, initial encounter: Secondary | ICD-10-CM | POA: Insufficient documentation

## 2014-10-26 DIAGNOSIS — R51 Headache: Principal | ICD-10-CM | POA: Insufficient documentation

## 2014-10-26 DIAGNOSIS — J45909 Unspecified asthma, uncomplicated: Secondary | ICD-10-CM | POA: Insufficient documentation

## 2014-10-26 DIAGNOSIS — M25559 Pain in unspecified hip: Secondary | ICD-10-CM

## 2014-10-26 DIAGNOSIS — N921 Excessive and frequent menstruation with irregular cycle: Secondary | ICD-10-CM | POA: Insufficient documentation

## 2014-10-26 DIAGNOSIS — F17210 Nicotine dependence, cigarettes, uncomplicated: Secondary | ICD-10-CM | POA: Insufficient documentation

## 2014-10-26 DIAGNOSIS — D259 Leiomyoma of uterus, unspecified: Secondary | ICD-10-CM | POA: Insufficient documentation

## 2014-10-26 DIAGNOSIS — R079 Chest pain, unspecified: Secondary | ICD-10-CM | POA: Insufficient documentation

## 2014-10-26 DIAGNOSIS — G43909 Migraine, unspecified, not intractable, without status migrainosus: Secondary | ICD-10-CM | POA: Diagnosis present

## 2014-10-26 DIAGNOSIS — Z91010 Allergy to peanuts: Secondary | ICD-10-CM | POA: Insufficient documentation

## 2014-10-26 DIAGNOSIS — Z832 Family history of diseases of the blood and blood-forming organs and certain disorders involving the immune mechanism: Secondary | ICD-10-CM | POA: Insufficient documentation

## 2014-10-26 DIAGNOSIS — I639 Cerebral infarction, unspecified: Secondary | ICD-10-CM

## 2014-10-26 DIAGNOSIS — R52 Pain, unspecified: Secondary | ICD-10-CM

## 2014-10-26 DIAGNOSIS — R112 Nausea with vomiting, unspecified: Secondary | ICD-10-CM | POA: Insufficient documentation

## 2014-10-26 DIAGNOSIS — Z7952 Long term (current) use of systemic steroids: Secondary | ICD-10-CM | POA: Insufficient documentation

## 2014-10-26 DIAGNOSIS — M1611 Unilateral primary osteoarthritis, right hip: Secondary | ICD-10-CM | POA: Insufficient documentation

## 2014-10-26 DIAGNOSIS — R202 Paresthesia of skin: Secondary | ICD-10-CM | POA: Insufficient documentation

## 2014-10-26 DIAGNOSIS — D509 Iron deficiency anemia, unspecified: Secondary | ICD-10-CM | POA: Insufficient documentation

## 2014-10-26 DIAGNOSIS — R531 Weakness: Secondary | ICD-10-CM | POA: Insufficient documentation

## 2014-10-26 DIAGNOSIS — Z791 Long term (current) use of non-steroidal anti-inflammatories (NSAID): Secondary | ICD-10-CM | POA: Insufficient documentation

## 2014-10-26 DIAGNOSIS — I10 Essential (primary) hypertension: Secondary | ICD-10-CM | POA: Insufficient documentation

## 2014-10-26 DIAGNOSIS — Z79899 Other long term (current) drug therapy: Secondary | ICD-10-CM | POA: Insufficient documentation

## 2014-10-26 DIAGNOSIS — J4540 Moderate persistent asthma, uncomplicated: Secondary | ICD-10-CM | POA: Insufficient documentation

## 2014-10-26 DIAGNOSIS — M93003 Unspecified slipped upper femoral epiphysis (nontraumatic), unspecified hip: Secondary | ICD-10-CM | POA: Diagnosis present

## 2014-10-26 DIAGNOSIS — Z8249 Family history of ischemic heart disease and other diseases of the circulatory system: Secondary | ICD-10-CM | POA: Insufficient documentation

## 2014-10-26 DIAGNOSIS — D473 Essential (hemorrhagic) thrombocythemia: Secondary | ICD-10-CM | POA: Insufficient documentation

## 2014-10-26 DIAGNOSIS — R519 Headache, unspecified: Secondary | ICD-10-CM

## 2014-10-26 DIAGNOSIS — M25551 Pain in right hip: Secondary | ICD-10-CM | POA: Insufficient documentation

## 2014-10-26 DIAGNOSIS — Z833 Family history of diabetes mellitus: Secondary | ICD-10-CM | POA: Insufficient documentation

## 2014-10-26 LAB — URINALYSIS COMPLETE WITH MICROSCOPIC (ARMC ONLY)
BILIRUBIN URINE: NEGATIVE
Bacteria, UA: NONE SEEN
Glucose, UA: NEGATIVE mg/dL
Hgb urine dipstick: NEGATIVE
KETONES UR: NEGATIVE mg/dL
Leukocytes, UA: NEGATIVE
Nitrite: NEGATIVE
Protein, ur: NEGATIVE mg/dL
RBC / HPF: NONE SEEN RBC/hpf (ref 0–5)
Specific Gravity, Urine: 1.015 (ref 1.005–1.030)
pH: 6 (ref 5.0–8.0)

## 2014-10-26 LAB — BASIC METABOLIC PANEL
Anion gap: 8 (ref 5–15)
BUN: 10 mg/dL (ref 6–20)
CALCIUM: 9.5 mg/dL (ref 8.9–10.3)
CO2: 25 mmol/L (ref 22–32)
Chloride: 105 mmol/L (ref 101–111)
Creatinine, Ser: 0.65 mg/dL (ref 0.44–1.00)
GFR calc Af Amer: >60 mL/min (ref >60)
GLUCOSE: 104 mg/dL — AB (ref 65–99)
Potassium: 3.4 mmol/L — ABNORMAL LOW (ref 3.5–5.1)
Sodium: 138 mmol/L (ref 135–145)

## 2014-10-26 LAB — CBC
HCT: 26.6 % — ABNORMAL LOW (ref 35.0–47.0)
Hemoglobin: 7.9 g/dL — ABNORMAL LOW (ref 12.0–16.0)
MCH: 16.8 pg — AB (ref 26.0–34.0)
MCHC: 29.7 g/dL — ABNORMAL LOW (ref 32.0–36.0)
MCV: 56.7 fL — ABNORMAL LOW (ref 80.0–100.0)
PLATELETS: 583 10*3/uL — AB (ref 150–440)
RBC: 4.69 MIL/uL (ref 3.80–5.20)
RDW: 19.5 % — ABNORMAL HIGH (ref 11.5–14.5)
WBC: 11.4 10*3/uL — ABNORMAL HIGH (ref 3.6–11.0)

## 2014-10-26 LAB — TROPONIN I: Troponin I: 0.04 ng/mL — ABNORMAL HIGH (ref <0.031)

## 2014-10-26 MED ORDER — ASPIRIN 81 MG PO CHEW
324.0000 mg | CHEWABLE_TABLET | Freq: Once | ORAL | Status: AC
  Administered 2014-10-27: 324 mg via ORAL
  Filled 2014-10-26: qty 4

## 2014-10-26 NOTE — ED Notes (Signed)
Dr Joni Fears notified of pt's elevated Troponin 0.04

## 2014-10-26 NOTE — ED Notes (Signed)
Pt presents to ER alert and in NAD. Pt reports she is having weakness and tingling on her left side. Pt states hx of HTN, some non compliance. Pt ambulatory. Pt states she fell yesterday and is having back pain. Clear speech. Moving all extremities normally.

## 2014-10-26 NOTE — ED Notes (Signed)
Patient reports that she had a syncopal episode yesterday. Patient also states that today she has had a headache and just not feeling well. Patient reports some left side arm pain that started around 18:00.

## 2014-10-26 NOTE — ED Notes (Signed)
Pt's primary nurse notified of pt's elevated Troponin at 0.04

## 2014-10-27 ENCOUNTER — Encounter: Payer: Self-pay | Admitting: *Deleted

## 2014-10-27 ENCOUNTER — Observation Stay: Payer: Self-pay

## 2014-10-27 DIAGNOSIS — G43909 Migraine, unspecified, not intractable, without status migrainosus: Secondary | ICD-10-CM | POA: Diagnosis present

## 2014-10-27 LAB — HEMOGLOBIN A1C: Hgb A1c MFr Bld: 6.5 % — ABNORMAL HIGH (ref 4.0–6.0)

## 2014-10-27 LAB — VITAMIN B12: Vitamin B-12: 242 pg/mL (ref 180–914)

## 2014-10-27 LAB — IRON AND TIBC
IRON: 10 ug/dL — AB (ref 28–170)
Saturation Ratios: 3 % — ABNORMAL LOW (ref 10.4–31.8)
TIBC: 391 ug/dL (ref 250–450)
UIBC: 381 ug/dL

## 2014-10-27 LAB — FERRITIN: Ferritin: 5 ng/mL — ABNORMAL LOW (ref 11–307)

## 2014-10-27 LAB — RETICULOCYTES
RBC.: 4.28 MIL/uL (ref 3.80–5.20)
Retic Count, Absolute: 70.1 10*3/uL (ref 19.0–183.0)
Retic Count, Absolute: 77 10*3/uL (ref 19.0–183.0)
Retic Ct Pct: 1.8 % (ref 0.4–3.1)

## 2014-10-27 LAB — SEDIMENTATION RATE: Sed Rate: 37 mm/hr — ABNORMAL HIGH (ref 0–20)

## 2014-10-27 LAB — FOLATE: FOLATE: 11.2 ng/mL (ref >5.9)

## 2014-10-27 LAB — PREGNANCY, URINE: PREG TEST UR: NEGATIVE

## 2014-10-27 LAB — TSH: TSH: 1.698 u[IU]/mL (ref 0.350–4.500)

## 2014-10-27 MED ORDER — MORPHINE SULFATE 2 MG/ML IJ SOLN
2.0000 mg | INTRAMUSCULAR | Status: DC | PRN
  Administered 2014-10-27 – 2014-10-28 (×7): 2 mg via INTRAVENOUS
  Filled 2014-10-27 (×7): qty 1

## 2014-10-27 MED ORDER — HEPARIN SODIUM (PORCINE) 5000 UNIT/ML IJ SOLN
5000.0000 [IU] | Freq: Three times a day (TID) | INTRAMUSCULAR | Status: DC
Start: 2014-10-27 — End: 2014-10-29
  Administered 2014-10-27 – 2014-10-29 (×6): 5000 [IU] via SUBCUTANEOUS
  Filled 2014-10-27 (×7): qty 1

## 2014-10-27 MED ORDER — AMLODIPINE BESYLATE 10 MG PO TABS
10.0000 mg | ORAL_TABLET | Freq: Every day | ORAL | Status: DC
  Administered 2014-10-27 – 2014-10-29 (×3): 10 mg via ORAL
  Filled 2014-10-27 (×3): qty 1

## 2014-10-27 MED ORDER — ACETAMINOPHEN 325 MG PO TABS
650.0000 mg | ORAL_TABLET | Freq: Once | ORAL | Status: AC
  Administered 2014-10-27: 650 mg via ORAL
  Filled 2014-10-27: qty 2

## 2014-10-27 MED ORDER — ACETAMINOPHEN 325 MG PO TABS
650.0000 mg | ORAL_TABLET | Freq: Four times a day (QID) | ORAL | Status: DC | PRN
  Administered 2014-10-27: 16:00:00 650 mg via ORAL
  Filled 2014-10-27: qty 2

## 2014-10-27 MED ORDER — TOPIRAMATE 25 MG PO TABS
25.0000 mg | ORAL_TABLET | Freq: Two times a day (BID) | ORAL | Status: DC
  Administered 2014-10-27 – 2014-10-28 (×3): 25 mg via ORAL
  Filled 2014-10-27 (×4): qty 1

## 2014-10-27 MED ORDER — POTASSIUM CHLORIDE IN NACL 40-0.9 MEQ/L-% IV SOLN
INTRAVENOUS | Status: DC
  Administered 2014-10-27 (×3): 100 mL/h via INTRAVENOUS
  Filled 2014-10-27 (×7): qty 1000

## 2014-10-27 MED ORDER — ACETAMINOPHEN 650 MG RE SUPP: 650.0000 mg | Freq: Four times a day (QID) | RECTAL | Status: DC | PRN

## 2014-10-27 MED ORDER — DOCUSATE SODIUM 100 MG PO CAPS
100.0000 mg | ORAL_CAPSULE | Freq: Two times a day (BID) | ORAL | Status: DC
Start: 2014-10-27 — End: 2014-10-29
  Administered 2014-10-27 – 2014-10-29 (×6): 100 mg via ORAL
  Filled 2014-10-27 (×6): qty 1

## 2014-10-27 MED ORDER — MONTELUKAST SODIUM 10 MG PO TABS
10.0000 mg | ORAL_TABLET | Freq: Every day | ORAL | Status: DC
  Administered 2014-10-27 – 2014-10-28 (×2): 10 mg via ORAL
  Filled 2014-10-27 (×2): qty 1

## 2014-10-27 MED ORDER — ALBUTEROL SULFATE (2.5 MG/3ML) 0.083% IN NEBU: 3.0000 mL | INHALATION_SOLUTION | Freq: Four times a day (QID) | RESPIRATORY_TRACT | Status: DC | PRN

## 2014-10-27 MED ORDER — BUTALBITAL-APAP-CAFFEINE 50-325-40 MG PO TABS
1.0000 | ORAL_TABLET | Freq: Four times a day (QID) | ORAL | Status: DC | PRN
  Administered 2014-10-27 – 2014-10-28 (×2): 1 via ORAL
  Filled 2014-10-27 (×2): qty 1

## 2014-10-27 MED ORDER — SODIUM CHLORIDE 0.9 % IJ SOLN
3.0000 mL | Freq: Two times a day (BID) | INTRAMUSCULAR | Status: DC
  Administered 2014-10-27 – 2014-10-29 (×4): 3 mL via INTRAVENOUS

## 2014-10-27 MED ORDER — CYCLOBENZAPRINE HCL 10 MG PO TABS
10.0000 mg | ORAL_TABLET | Freq: Three times a day (TID) | ORAL | Status: DC | PRN
  Administered 2014-10-27 – 2014-10-28 (×2): 10 mg via ORAL
  Filled 2014-10-27 (×2): qty 1

## 2014-10-27 MED ORDER — LISINOPRIL 10 MG PO TABS
10.0000 mg | ORAL_TABLET | Freq: Every day | ORAL | Status: DC
Start: 2014-10-27 — End: 2014-10-28
  Administered 2014-10-27 – 2014-10-28 (×2): 10 mg via ORAL
  Filled 2014-10-27 (×2): qty 1

## 2014-10-27 MED ORDER — MOMETASONE FURO-FORMOTEROL FUM 200-5 MCG/ACT IN AERO
2.0000 | INHALATION_SPRAY | Freq: Two times a day (BID) | RESPIRATORY_TRACT | Status: DC
  Administered 2014-10-27 – 2014-10-29 (×5): 2 via RESPIRATORY_TRACT
  Filled 2014-10-27: qty 8.8

## 2014-10-27 MED ORDER — ONDANSETRON HCL 4 MG PO TABS: 4.0000 mg | ORAL_TABLET | Freq: Four times a day (QID) | ORAL | Status: DC | PRN

## 2014-10-27 MED ORDER — ONDANSETRON HCL 4 MG/2ML IJ SOLN
4.0000 mg | Freq: Four times a day (QID) | INTRAMUSCULAR | Status: DC | PRN
  Administered 2014-10-28: 4 mg via INTRAVENOUS
  Filled 2014-10-27: qty 2

## 2014-10-27 MED ORDER — KETOROLAC TROMETHAMINE 30 MG/ML IJ SOLN
30.0000 mg | Freq: Once | INTRAMUSCULAR | Status: AC
  Administered 2014-10-27: 30 mg via INTRAVENOUS
  Filled 2014-10-27: qty 1

## 2014-10-27 NOTE — H&P (Signed)
Kelly Byrd is an 39 y.o. female.   Chief Complaint: Headache HPI: Patient presents to the emergency department complaining of persistent headache. She states that her head has been throbbing and pain intermittently 1 week. It hurts so frequently that she's had difficulty sleeping. She has no history of headaches or migraines. She states that her head began to hurt approximately one month ago when she developed sharp brief chest pains over her left breast that did not radiate and were not associated with nausea, vomiting or diaphoresis. The patient admits that the pain would sometimes take her breath away but that rubbing her chest would alleviate the pain sometimes. Symptom that was consistently associated with her chest pain however was left parietal and right occipital headache. Now she complains of left sided tingling in her upper and lower extremities. She has tried to take Tylenol PM, Aleve, Percocet, and ibuprofen with little relief. Due to her intractable headache and persistent symptoms emergency department called for admission.  Past Medical History  Diagnosis Date  . Asthma   . Hypertension     Past Surgical History  Procedure Laterality Date  . Cesarean section      Family History  Problem Relation Age of Onset  . Hypertension Mother   . Hypertension Father   . Diabetes Mellitus II Maternal Grandmother   . Lupus     Social History:  reports that she has been smoking Cigarettes.  She does not have any smokeless tobacco history on file. She reports that she does not drink alcohol or use illicit drugs.  Allergies:  Allergies  Allergen Reactions  . Peanuts [Peanut Oil]     Medications Prior to Admission  Medication Sig Dispense Refill  . albuterol (PROVENTIL HFA;VENTOLIN HFA) 108 (90 BASE) MCG/ACT inhaler Inhale 2 puffs into the lungs every 6 (six) hours as needed for wheezing or shortness of breath (wheezing).    Marland Kitchen albuterol (PROVENTIL) (2.5 MG/3ML) 0.083%  nebulizer solution Take 3 mLs (2.5 mg total) by nebulization every 6 (six) hours as needed for wheezing or shortness of breath (wheezing). 150 mL 1  . amLODipine (NORVASC) 10 MG tablet Take 1 tablet (10 mg total) by mouth daily. 30 tablet 1  . cyclobenzaprine (FLEXERIL) 10 MG tablet Take 1 tablet (10 mg total) by mouth every 8 (eight) hours as needed for muscle spasms (PRN pain. Do not drive or operate heavy machinery while taking as can cause drowsiness.). 12 tablet 0  . ibuprofen (ADVIL,MOTRIN) 800 MG tablet Take 1 tablet (800 mg total) by mouth every 8 (eight) hours as needed for mild pain or moderate pain. 15 tablet 0  . lisinopril (PRINIVIL,ZESTRIL) 10 MG tablet Take 1 tablet (10 mg total) by mouth daily. 30 tablet 1  . mometasone-formoterol (DULERA) 200-5 MCG/ACT AERO Inhale 2 puffs into the lungs 2 (two) times daily.    . montelukast (SINGULAIR) 10 MG tablet Take 1 tablet (10 mg total) by mouth at bedtime. 30 tablet 1  . azithromycin (ZITHROMAX Z-PAK) 250 MG tablet 2 tablets day 1;    1 tablet day 2 through 5 6 each 0  . HYDROcodone-acetaminophen (NORCO) 5-325 MG per tablet Take 1 tablet by mouth every 4 (four) hours as needed. 20 tablet 0  . oxyCODONE-acetaminophen (PERCOCET/ROXICET) 5-325 MG per tablet Take 1-2 tablets by mouth every 6 (six) hours as needed. 20 tablet 0  . predniSONE (DELTASONE) 50 MG tablet 1 tablet daily for 5 days, one half tablet daily for 5 days 8 tablet 0  Results for orders placed or performed during the hospital encounter of 10/26/14 (from the past 48 hour(s))  Urinalysis complete, with microscopic ([facility] only)     Status: Abnormal   Collection Time: 10/26/14 10:24 PM  Result Value Ref Range   Color, Urine YELLOW (A) YELLOW   APPearance CLEAR (A) CLEAR   Glucose, UA NEGATIVE NEGATIVE mg/dL   Bilirubin Urine NEGATIVE NEGATIVE   Ketones, ur NEGATIVE NEGATIVE mg/dL   Specific Gravity, Urine 1.015 1.005 - 1.030   Hgb urine dipstick NEGATIVE NEGATIVE   pH 6.0 5.0  - 8.0   Protein, ur NEGATIVE NEGATIVE mg/dL   Nitrite NEGATIVE NEGATIVE   Leukocytes, UA NEGATIVE NEGATIVE   RBC / HPF NONE SEEN 0 - 5 RBC/hpf   WBC, UA 0-5 0 - 5 WBC/hpf   Bacteria, UA NONE SEEN NONE SEEN   Squamous Epithelial / LPF 6-30 (A) NONE SEEN   Mucous PRESENT   Pregnancy, urine     Status: None   Collection Time: 10/26/14 10:24 PM  Result Value Ref Range   Preg Test, Ur NEGATIVE NEGATIVE  Basic metabolic panel     Status: Abnormal   Collection Time: 10/26/14 10:35 PM  Result Value Ref Range   Sodium 138 135 - 145 mmol/L   Potassium 3.4 (L) 3.5 - 5.1 mmol/L   Chloride 105 101 - 111 mmol/L   CO2 25 22 - 32 mmol/L   Glucose, Bld 104 (H) 65 - 99 mg/dL   BUN 10 6 - 20 mg/dL   Creatinine, Ser 0.65 0.44 - 1.00 mg/dL   Calcium 9.5 8.9 - 10.3 mg/dL   GFR calc non Af Amer >60 >60 mL/min   GFR calc Af Amer >60 >60 mL/min    Comment: (NOTE) The eGFR has been calculated using the CKD EPI equation. This calculation has not been validated in all clinical situations. eGFR's persistently <60 mL/min signify possible Chronic Kidney Disease.    Anion gap 8 5 - 15  CBC     Status: Abnormal   Collection Time: 10/26/14 10:35 PM  Result Value Ref Range   WBC 11.4 (H) 3.6 - 11.0 K/uL   RBC 4.69 3.80 - 5.20 MIL/uL   Hemoglobin 7.9 (L) 12.0 - 16.0 g/dL   HCT 26.6 (L) 35.0 - 47.0 %   MCV 56.7 (L) 80.0 - 100.0 fL   MCH 16.8 (L) 26.0 - 34.0 pg   MCHC 29.7 (L) 32.0 - 36.0 g/dL   RDW 19.5 (H) 11.5 - 14.5 %   Platelets 583 (H) 150 - 440 K/uL  Troponin I     Status: Abnormal   Collection Time: 10/26/14 10:35 PM  Result Value Ref Range   Troponin I 0.04 (H) <0.031 ng/mL    Comment: READ BACK AND VERIFIED WITH LAURIE ALLEN AT 2338 10/26/14.PMH        PERSISTENTLY INCREASED TROPONIN VALUES IN THE RANGE OF 0.04-0.49 ng/mL CAN BE SEEN IN:       -UNSTABLE ANGINA       -CONGESTIVE HEART FAILURE       -MYOCARDITIS       -CHEST TRAUMA       -ARRYHTHMIAS       -LATE PRESENTING MYOCARDIAL  INFARCTION       -COPD   CLINICAL FOLLOW-UP RECOMMENDED.   TSH     Status: None   Collection Time: 10/26/14 10:35 PM  Result Value Ref Range   TSH 1.698 0.350 - 4.500 uIU/mL   Ct Head Wo  Contrast  10/26/2014   CLINICAL DATA:  Weakness and tingling left side. Started this morning. Fall yesterday with loss of consciousness.  EXAM: CT HEAD WITHOUT CONTRAST  TECHNIQUE: Contiguous axial images were obtained from the base of the skull through the vertex without intravenous contrast.  COMPARISON:  Head CT 09/11/2010  FINDINGS: No intracranial hemorrhage, mass effect, or midline shift. No hydrocephalus. The basilar cisterns are patent. No evidence of territorial infarct. No intracranial fluid collection. Calvarium is intact. Included paranasal sinuses and mastoid air cells are well aerated.  IMPRESSION: No acute intracranial abnormality.   Electronically Signed   By: Jeb Levering M.D.   On: 10/26/2014 22:39    Review of Systems  Constitutional: Negative for fever and chills.  HENT: Negative for nosebleeds, sore throat and tinnitus.   Eyes: Negative for blurred vision, photophobia and redness.  Respiratory: Negative for cough and shortness of breath.   Cardiovascular: Positive for leg swelling. Negative for chest pain, palpitations, orthopnea and PND.  Gastrointestinal: Negative for nausea, vomiting, abdominal pain and diarrhea.  Genitourinary: Negative for dysuria, urgency and frequency.  Musculoskeletal: Negative for myalgias and joint pain.  Skin: Negative for rash.       No lesions  Neurological: Positive for headaches. Negative for speech change, focal weakness and weakness.  Endo/Heme/Allergies: Does not bruise/bleed easily.       No temperature intolerance  Psychiatric/Behavioral: Negative for depression and suicidal ideas.    Blood pressure 147/79, pulse 91, temperature 97.6 F (36.4 C), temperature source Oral, resp. rate 20, height '5\' 4"'  (1.626 m), weight 113.49 kg (250 lb 3.2  oz), last menstrual period 10/15/2014, SpO2 100 %. Physical Exam  Nursing note and vitals reviewed. Constitutional: She is oriented to person, place, and time. Vital signs are normal. She appears well-developed and well-nourished. No distress.  HENT:  Head: Normocephalic and atraumatic.  Mouth/Throat: Oropharynx is clear and moist.  Eyes: Conjunctivae and EOM are normal. Pupils are equal, round, and reactive to light. No scleral icterus.  Fundoscopic exam:      The right eye shows arteriolar narrowing. The right eye shows no papilledema.       The left eye shows arteriolar narrowing. The left eye shows no papilledema.  Neck: Normal range of motion. Neck supple. No JVD present. No tracheal deviation present. No thyromegaly present.  Cardiovascular: Normal rate, regular rhythm and normal heart sounds.  Exam reveals no gallop and no friction rub.   No murmur heard. Respiratory: Effort normal and breath sounds normal.  GI: Soft. Bowel sounds are normal. She exhibits no distension. There is no tenderness.  Genitourinary:  Deferred  Musculoskeletal: Normal range of motion. She exhibits edema (1+ lower extremities bilaterally).  Lymphadenopathy:    She has no cervical adenopathy.  Neurological: She is alert and oriented to person, place, and time. She has normal strength. A sensory deficit is present. No cranial nerve deficit. She exhibits normal muscle tone. She displays a negative Romberg sign.  Skin: Skin is warm and dry.  Psychiatric: She has a normal mood and affect. Her behavior is normal. Judgment and thought content normal.     Assessment/Plan This is a 39 year old African-American female admitted for cluster headaches. 1. Cluster headache: Episodic, non-intractable. No papilledema, photophobia or nuchal rigidity on physical exam.  Differential diagnosis includes pseudotumor cerebri, normal pressure hydrocephalus, lupus cerebritis (unlikely). I have started the patient on a trial of  Topamax. She also has morphine available for severe intractable pain. I've ordered a neurology consult  due to her left-sided paresthesias which will hopefully resolve with her headache symptoms. I do not believe that she needs a lumbar puncture at this time.  The complicating aspect of the patient's headache syndrome is the new onset of lower extremity edema. Kidney function at this time is normal. 2. Chest pain: Not present at this time. Likely musculoskeletal given its sharp and brief nature. Monitor telemetry. EKG is normal 3. Hypertension: Controlled. Continue amlodipine and lisinopril 4. Asthma: moderate persistent. Continue albuterol as needed. Due to the frequency of her symptoms the patient needs to be on an inhaled corticosteroid. Case management consult ordered for assistance with prescription medications. 5. DVT prophylaxis: Heparin 6. GI prophylaxis: None The patient is a full code. Time spent on admission orders and patient care approximately 35 minutes  Harrie Foreman 10/27/2014, 6:07 AM

## 2014-10-27 NOTE — Progress Notes (Signed)
Telemetry strip sent from CCU stating possible non-sustain VTach. On call MD made aware.  Pt asymptomatic. HR remains 80-84. Dr. Margaretmary Eddy stated to order EKG. Will continue to assess.

## 2014-10-27 NOTE — Progress Notes (Signed)
[facility] [place] NAME: Kelly Byrd    MR#:  [id number]  DATE OF BIRTH:  02/11/1976  SUBJECTIVE:  CHIEF COMPLAINT:   Chief Complaint  Patient presents with  . Weakness    Pt presents to ER alert and in NAD. Pt reports she is having weakness and tingling on her left side. Pt states hx of HTN, some non compliance. Pt ambulatory. Pt states she fell yesterday and is having back pain. Clear speech. Moving all extremities normally.  Marland Kitchen Headache   Continued headache- right side and back of head, photophobia, nausea Continued left chest/back pain- worse with movement of the arm Continued left sided numbness over left face, arm and leg   REVIEW OF SYSTEMS:   Review of Systems  Constitutional: Positive for malaise/fatigue. Negative for fever.  Eyes: Positive for photophobia.  Respiratory: Negative for shortness of breath.   Cardiovascular: Positive for chest pain and leg swelling. Negative for palpitations.  Gastrointestinal: Positive for nausea. Negative for vomiting and abdominal pain.  Genitourinary: Negative for dysuria.  Musculoskeletal: Positive for neck pain.  Neurological: Positive for sensory change, weakness and headaches.    DRUG ALLERGIES:   Allergies  Allergen Reactions  . Peanuts [Peanut Oil]     VITALS:  Blood pressure 147/79, pulse 91, temperature 97.6 F (36.4 C), temperature source Oral, resp. rate 20, height _0  (1.626 m), weight 113.49 kg (250 lb 3.2 oz), last menstrual period 10/15/2014, SpO2 100 %.  PHYSICAL EXAMINATION:  GENERAL:  39 y.o.-year-old patient lying in the bed, comfortable with eyes closed EYES: Pupils equal, round, reactive to light and accommodation. No scleral icterus. Right eye occasionally deviates to the right HEENT: Head atraumatic, normocephalic. Oropharynx and nasopharynx clear. Mucous membranes are moist NECK:  Supple, no jugular venous distention. No thyroid enlargement,  no tenderness.  LUNGS: Normal breath sounds bilaterally, no wheezing, rales,rhonchi or crepitation. No use of accessory muscles of respiration.  CARDIOVASCULAR: S1, S2 normal. No murmurs, rubs, or gallops.  ABDOMEN: Soft, nontender, nondistended. Bowel sounds present. No organomegaly or mass.  EXTREMITIES: +1 pedal edema, no cyanosis, or clubbing.  NEUROLOGIC: Right eye muscles seem weak, does not track with the left, otherwise cranial nerves are intact. Muscle strength 5/5 in all extremities. Sensation intact, though she has sensation of paresthesias over the left side of her body. Gait not checked.  PSYCHIATRIC: The patient is alert and oriented x 3. Anxious SKIN: No obvious rash, lesion, or ulcer.    LABORATORY PANEL:   CBC  Recent Labs Lab 10/26/14 2235  WBC 11.4*  HGB 7.9*  HCT 26.6*  PLT 583*   ------------------------------------------------------------------------------------------------------------------  Chemistries   Recent Labs Lab 10/26/14 2235  NA 138  K 3.4*  CL 105  CO2 25  GLUCOSE 104*  BUN 10  CREATININE 0.65  CALCIUM 9.5   ------------------------------------------------------------------------------------------------------------------  Cardiac Enzymes  Recent Labs Lab 10/26/14 2235  TROPONINI 0.04*   ------------------------------------------------------------------------------------------------------------------  RADIOLOGY:  Ct Head Wo Contrast  10/26/2014   CLINICAL DATA:  Weakness and tingling left side. Started this morning. Fall yesterday with loss of consciousness.  EXAM: CT HEAD WITHOUT CONTRAST  TECHNIQUE: Contiguous axial images were obtained from the base of the skull through the vertex without intravenous contrast.  COMPARISON:  Head CT 09/11/2010  FINDINGS: No intracranial hemorrhage, mass effect, or midline shift. No hydrocephalus. The basilar cisterns are patent. No evidence of territorial infarct. No intracranial fluid collection.  Calvarium is intact. Included paranasal sinuses  and mastoid air cells are well aerated.  IMPRESSION: No acute intracranial abnormality.   Electronically Signed   By: Jeb Levering M.D.   On: 10/26/2014 22:39   Dg Hip Unilat With Pelvis 2-3 Views Right  10/27/2014   CLINICAL DATA:  Fall down steps 2 days ago.  Right hip pain.  EXAM: DG HIP (WITH OR WITHOUT PELVIS) 2-3V RIGHT  COMPARISON:  CT abdomen and pelvis 07/24/2010  FINDINGS: There is flattening of the right femoral head and coxa magna. 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 This is unchanged since CT from 2012. No acute bony abnormality. No fracture, subluxation or dislocation. Left hip is unremarkable. SI joints are symmetric.  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 No acute findings.   Electronically Signed   By: Rolm Baptise M.D.   On: 10/27/2014 10:04    EKG:   Orders placed or performed during the hospital encounter of 10/26/14  . ED EKG  . ED EKG  . EKG 12-Lead  . EKG 12-Lead    ASSESSMENT AND PLAN:   #1 headache: She does have photophobia, nausea and paresthesias. Possibly atypical migraine. Agree with differential as outlined by Dr. Marcille Blanco as well. Agree with neurology consultation. Will also order an ESR and a NA with reflex serologies if positive.  #2 chest pain: Seems to be musculoskeletal given its sharp nature and worsening with movement of the left arm. There is also involvement of left upper back muscles. We'll ask for physical therapy evaluation  #3 hypertension: Continue amlodipine and lisinopril. Amlodipine could cause her lower extremity edema. Blood pressures currently controlled.  #4 anemia: Check anemia panel. Thrombocytosis is also noted, this can be associated with iron deficiency anemia  #5 right hip pain: We'll get x-rays of the right hip  #6 prophylaxis heparin for DVT prophylaxis, no GI prophylaxis  CODE STATUS: Full  TOTAL TIME TAKING  CARE OF THIS PATIENT: 35 minutes.  Greater than 50% of time spent in care coordination and counseling. Spoke with the patient and her fianc at the bedside. POSSIBLE D/C IN 1-2 DAYS, DEPENDING ON CLINICAL CONDITION.  Myrtis Ser M.D on 10/27/2014 at 12:40 PM  Between 7am to 6pm - Pager - [phone]  After 6pm go to [email or web address] - password [email or web address] [facility]  [facility]  Office  [phone]  CC: Primary care physician; No PCP Per Patient

## 2014-10-27 NOTE — Plan of Care (Signed)
Problem: Discharge Progression Outcomes Goal: Other Discharge Outcomes/Goals Outcome: Progressing Plan of care progress to goals: Discharge plan- pt will discharge to home when stable Hemodynamically stable- VSS, Hgb 7.9, continue to monitor Complications- Off unit cardiac monitor reports NSR, heart rate 82. Diet- no c/o nausea or vomiting. Activity- history of falls, high fall risk and exit alarm activated.

## 2014-10-27 NOTE — Plan of Care (Signed)
Problem: Discharge Progression Outcomes Goal: Other Discharge Outcomes/Goals Outcome: Progressing 1. Headache pain relieved by PRN Morphine & Tylenol throughout the day  2. Hemodynamically:             -VSS, BP high but new BP medications started this AM to get under control              -IVF w/ Potassium infusing as ordered             -Iron 10, Hgb 7.9, Platelets 583 today. MD Aware. 3. Tolerating diet well.  4. +1 assist to bathroom. Worked w/ PT today.

## 2014-10-27 NOTE — Progress Notes (Signed)
Physical Therapy Evaluation Patient Details Name: Kelly Byrd MRN: [id number] DOB: Jun 15, 1975 Today's Date: 10/27/2014   History of Present Illness  Patient is a 39 y.o. female admitted for intractable HA.   Clinical Impression  Patient is a previously independent woman who works at [employer] and lives on the second floor of an apartment building. Patient complains of month-long history of chest pain/HAs that she feels when she is bending forward or breathing heavy. Patient verbalizes that HAs are felt in occipital and temporal regions. Denies clenching teeth/bruxism. Admits to neck pain. Admits that she has been limited in working and walking by pain in R LE. Upon evaluation, patient demonstrated ability to perform visual tracking/saccades without difficulty. VOR deferred. Patient demonstrated significant limitations in ambulation, requiring standing rest breaks because of B hand tingling, increasing HA, and increasing R LE pain. Patient denies being assessed by OP neuromuscular PT for cervicogenic HAs/hypomobility in thoracic spine that may lead to referred tingling in hands. At this point, patient is not functioning at baseline level and will benefit from further rehabilitation to improve gait and activity tolerance. She may also benefit from f/u with OP PT to differentiate cause of HAs after discharge from SNF.    Follow Up Recommendations SNF    Equipment Recommendations  Rolling walker with 5" wheels    Recommendations for Other Services Rehab consult     Precautions / Restrictions Precautions Precautions: Fall Restrictions Weight Bearing Restrictions: No      Mobility  Bed Mobility Overal bed mobility: Independent                Transfers Overall transfer level: Modified independent Equipment used: Rolling walker (2 wheeled)             General transfer comment: Patient required verbal cues for proper use of AD.  Ambulation/Gait Ambulation/Gait  assistance: Min assist Ambulation Distance (Feet): 50 Feet Assistive device: Rolling walker (2 wheeled) Gait Pattern/deviations: Step-to pattern;Decreased weight shift to right;Shuffle     General Gait Details: Patient demonstrates decreased cadence during gait. Limiting weight shifting to R LE, complaining of increasing HA and tingling in B hands with ambulation. Also required standing rest break due to pain in R LE.  Stairs            Wheelchair Mobility    Modified Rankin (Stroke Patients Only)       Balance Overall balance assessment: Needs assistance Sitting-balance support: Feet supported Sitting balance-Leahy Scale: Good   Postural control: Other (comment) (FHP, rounded shoulders) Standing balance support: Bilateral upper extremity supported Standing balance-Leahy Scale: Good Standing balance comment: Patient demonstrates ability to weight shift in standing with UE support                             Pertinent Vitals/Pain Pain Assessment: 0-10 Pain Score: 8  Pain Location: R LE Pain Descriptors / Indicators: Aching Pain Intervention(s): Limited activity within patient's tolerance;Monitored during session    Home Living Family/patient expects to be discharged to:: Private residence Living Arrangements: Spouse/significant other (Fiance (supposed to be married next Saturday)) Available Help at Discharge: Family;Available PRN/intermittently Type of Home: Apartment Home Access: Stairs to enter Entrance Stairs-Rails: Right (Bilateral but can only reach one side) Entrance Stairs-Number of Steps: 12 Home Layout: One level Home Equipment: None      Prior Function Level of Independence: Independent with assistive device(s)         Comments: Utilizes power chairs  at stores for community shopping     Hand Dominance        Extremity/Trunk Assessment   Upper Extremity Assessment: Overall WFL for tasks assessed (Complained of tingling in hands  with ambulation, denies DM)           Lower Extremity Assessment: Generalized weakness;RLE deficits/detail RLE Deficits / Details: Pain R hip/knee       Communication   Communication: No difficulties  Cognition Arousal/Alertness: Lethargic Behavior During Therapy: Flat affect Overall Cognitive Status: Within Functional Limits for tasks assessed                      General Comments      Exercises        Assessment/Plan    PT Assessment Patient needs continued PT services  PT Diagnosis Difficulty walking;Acute pain   PT Problem List Decreased activity tolerance;Decreased mobility;Decreased knowledge of use of DME;Pain  PT Treatment Interventions DME instruction;Gait training;Stair training;Functional mobility training;Therapeutic activities;Therapeutic exercise;Patient/family education   PT Goals (Current goals can be found in the Care Plan section) Acute Rehab PT Goals Patient Stated Goal: "To decrease pain/HA" PT Goal Formulation: With patient Time For Goal Achievement: 11/10/14 Potential to Achieve Goals: Good    Frequency Min 2X/week   Barriers to discharge Inaccessible home environment;Decreased caregiver support      Co-evaluation               End of Session Equipment Utilized During Treatment: Gait belt Activity Tolerance: Patient limited by fatigue;Patient limited by pain Patient left: in bed;with call bell/phone within reach;with bed alarm set;with SCD's reapplied;with family/visitor present      Functional Limitation: Mobility: Walking and moving around Mobility: Walking and Moving Around Current Status (T7017): At least 20 percent but less than 40 percent impaired, limited or restricted Mobility: Walking and Moving Around Discharge Status [id number]): At least 1 percent but less than 20 percent impaired, limited or restricted    Time: 1210-1239 PT Time Calculation (min) (ACUTE ONLY): 29 min   Charges:   PT Evaluation $Initial PT  Evaluation Tier I: 1 Procedure     PT G Codes:   PT G-Codes **NOT FOR INPATIENT CLASS** Functional Limitation: Mobility: Walking and moving around Mobility: Walking and Moving Around Current Status (Z0092): At least 20 percent but less than 40 percent impaired, limited or restricted Mobility: Walking and Moving Around Discharge Status [phone]): At least 1 percent but less than 20 percent impaired, limited or restricted    Dorice Lamas, PT, DPT 10/27/2014, 12:55 PM

## 2014-10-27 NOTE — Progress Notes (Signed)
Pt very upset about being on heart healthy/carb modified diet stating she does not eat that way at home. Demanding to notify MD. Dr. Volanda Napoleon paged. Telephone order for regular diet.

## 2014-10-27 NOTE — ED Provider Notes (Signed)
[facility] Emergency Department Provider Note  ____________________________________________  Time seen: 11:15 PM  I have reviewed the triage vital signs and the nursing notes.   HISTORY  Chief Complaint Weakness and Headache    HPI Kelly Byrd is a 39 y.o. female who complains of bilateral headache and left-sided tingling that started about 4 PM today. The tingling is been constant since then. He denies weakness or falls today, but did have a fall yesterday resulting in some left flank pain. She also complains of some intermittent blurry vision and photopsia, no vomiting or diarrhea. No chest pain or shortness of breath     Past Medical History  Diagnosis Date  . Asthma   . Hypertension     There are no active problems to display for this patient.   Past Surgical History  Procedure Laterality Date  . Cesarean section      Current Outpatient Rx  Name  Route  Sig  Dispense  Refill  . albuterol (PROVENTIL HFA;VENTOLIN HFA) 108 (90 BASE) MCG/ACT inhaler   Inhalation   Inhale 2 puffs into the lungs every 6 (six) hours as needed for wheezing or shortness of breath (wheezing).         Marland Kitchen albuterol (PROVENTIL) (2.5 MG/3ML) 0.083% nebulizer solution   Nebulization   Take 3 mLs (2.5 mg total) by nebulization every 6 (six) hours as needed for wheezing or shortness of breath (wheezing).   150 mL   1   . amLODipine (NORVASC) 10 MG tablet   Oral   Take 1 tablet (10 mg total) by mouth daily.   30 tablet   1   . cyclobenzaprine (FLEXERIL) 10 MG tablet   Oral   Take 1 tablet (10 mg total) by mouth every 8 (eight) hours as needed for muscle spasms (PRN pain. Do not drive or operate heavy machinery while taking as can cause drowsiness.).   12 tablet   0   . ibuprofen (ADVIL,MOTRIN) 800 MG tablet   Oral   Take 1 tablet (800 mg total) by mouth every 8 (eight) hours as needed for mild pain or moderate pain.   15 tablet   0   . lisinopril  (PRINIVIL,ZESTRIL) 10 MG tablet   Oral   Take 1 tablet (10 mg total) by mouth daily.   30 tablet   1   . mometasone-formoterol (DULERA) 200-5 MCG/ACT AERO   Inhalation   Inhale 2 puffs into the lungs 2 (two) times daily.         . montelukast (SINGULAIR) 10 MG tablet   Oral   Take 1 tablet (10 mg total) by mouth at bedtime.   30 tablet   1   . azithromycin (ZITHROMAX Z-PAK) 250 MG tablet      2 tablets day 1;    1 tablet day 2 through 5   6 each   0   . HYDROcodone-acetaminophen (NORCO) 5-325 MG per tablet   Oral   Take 1 tablet by mouth every 4 (four) hours as needed.   20 tablet   0   . oxyCODONE-acetaminophen (PERCOCET/ROXICET) 5-325 MG per tablet   Oral   Take 1-2 tablets by mouth every 6 (six) hours as needed.   20 tablet   0   . predniSONE (DELTASONE) 50 MG tablet      1 tablet daily for 5 days, one half tablet daily for 5 days   8 tablet   0     Allergies  Peanuts  History reviewed. No pertinent family history.  Social History History  Substance Use Topics  . Smoking status: Current Every Day Smoker    Types: Cigarettes  . Smokeless tobacco: Not on file  . Alcohol Use: No    Review of Systems  Constitutional: No fever or chills. No weight changes Eyes:No blurry vision or double vision.  ENT: No sore throat. Cardiovascular: No chest pain. Respiratory: No dyspnea or cough. Gastrointestinal: Negative for abdominal pain, vomiting and diarrhea.  No BRBPR or melena. Genitourinary: Negative for dysuria, urinary retention, bloody urine, or difficulty urinating. Musculoskeletal: Positive left flank pain, chronic right knee pain, chronic right hip pain. Skin: Negative for rash. Neurological: Bilateral headache, paresthesia of left arm and left leg. Psychiatric:No anxiety or depression.   Endocrine:No hot/cold intolerance, changes in energy, or sleep difficulty.  10-point ROS otherwise  negative.  ____________________________________________   PHYSICAL EXAM:  VITAL SIGNS: ED Triage Vitals  Enc Vitals Group     BP 10/26/14 2213 168/95 mmHg     Pulse Rate 10/26/14 2213 90     Resp 10/26/14 2213 20     Temp 10/26/14 2213 98.2 F (36.8 C)     Temp Source 10/26/14 2213 Oral     SpO2 10/26/14 2213 100 %     Weight 10/26/14 2213 245 lb (111.131 kg)     Height 10/26/14 2213 5\' 4"  (1.626 m)     Head Cir --      Peak Flow --      Pain Score 10/26/14 2214 10     Pain Loc --      Pain Edu? --      Excl. in [place]? --      Constitutional: Alert and oriented. Well appearing and in no distress. Eyes: No scleral icterus. No conjunctival pallor. PERRL. EOMI. Funduscopy normal with sharp optic discs ENT   Head: Normocephalic and atraumatic.   Nose: No congestion/rhinnorhea. No septal hematoma   Mouth/Throat: MMM, no pharyngeal erythema. No peritonsillar mass. No uvula shift.   Neck: No stridor. No SubQ emphysema. No meningismus. Hematological/Lymphatic/Immunilogical: No cervical lymphadenopathy. Cardiovascular: RRR. Normal and symmetric distal pulses are present in all extremities. No murmurs, rubs, or gallops. Respiratory: Normal respiratory effort without tachypnea nor retractions. Breath sounds are clear and equal bilaterally. No wheezes/rales/rhonchi. Gastrointestinal: Soft and nontender. No distention. There is no CVA tenderness.  No rebound, rigidity, or guarding. Genitourinary: deferred Musculoskeletal: Nontender with normal range of motion in all extremities. No joint effusions.  No lower extremity tenderness.  No edema. Neurologic:   Normal speech and language.  CN 2-10 normal. Motor grossly intact. No pronator drift.  Normal gait. Diminished sensation to sharp stimulus in left arm and left leg diffusely compared to right NIH stroke scale equals 2 No gross focal neurologic deficits are appreciated.  Skin:  Skin is warm, dry and intact. No rash noted.   No petechiae, purpura, or bullae. Psychiatric: Mood and affect are normal. Speech and behavior are normal. Patient exhibits appropriate insight and judgment.  ____________________________________________    LABS (pertinent positives/negatives) (all labs ordered are listed, but only abnormal results are displayed) Labs Reviewed  BASIC METABOLIC PANEL - Abnormal; Notable for the following:    Potassium 3.4 (*)    Glucose, Bld 104 (*)    All other components within normal limits  CBC - Abnormal; Notable for the following:    WBC 11.4 (*)    Hemoglobin 7.9 (*)    HCT 26.6 (*)  MCV 56.7 (*)    MCH 16.8 (*)    MCHC 29.7 (*)    RDW 19.5 (*)    Platelets 583 (*)    All other components within normal limits  URINALYSIS COMPLETEWITH MICROSCOPIC ([facility] ONLY) - Abnormal; Notable for the following:    Color, Urine YELLOW (*)    APPearance CLEAR (*)    Squamous Epithelial / LPF 6-30 (*)    All other components within normal limits  TROPONIN I - Abnormal; Notable for the following:    Troponin I 0.04 (*)    All other components within normal limits  PREGNANCY, URINE   ____________________________________________   EKG  Interpreted by me Normal sinus rhythm rate of 87, normal axis, prolonged QT with a QTC of 510, poor R-wave progression in anterior precordial leads, normal ST segments and T waves.  ____________________________________________    RADIOLOGY  CT head unremarkable  ____________________________________________   PROCEDURES  ____________________________________________   INITIAL IMPRESSION / ASSESSMENT AND PLAN / ED COURSE  Pertinent labs & imaging results that were available during my care of the patient were reviewed by me and considered in my medical decision making (see chart for details).  Patient presents with signs and symptoms concerning for sensory cortex CVA. Stroke scale is very low, deficit is minor, not a candidate for TPA, especially out of a 3  hour window. Labs also reveal a troponin of slight elevation of 0.04 where it was previously negative. Aspirin given. Patient is her primary care doctor, will plan to hospitalize for further workup.  ____________________________________________   FINAL CLINICAL IMPRESSION(S) / ED DIAGNOSES  Final diagnoses:  Cerebral infarction due to unspecified mechanism      Carrie Mew, MD 10/27/14 [id number]

## 2014-10-28 ENCOUNTER — Observation Stay: Payer: Self-pay

## 2014-10-28 DIAGNOSIS — G4441 Drug-induced headache, not elsewhere classified, intractable: Secondary | ICD-10-CM

## 2014-10-28 LAB — BASIC METABOLIC PANEL
ANION GAP: 5 (ref 5–15)
BUN: 7 mg/dL (ref 6–20)
CO2: 24 mmol/L (ref 22–32)
CREATININE: 0.52 mg/dL (ref 0.44–1.00)
Calcium: 9.1 mg/dL (ref 8.9–10.3)
Chloride: 111 mmol/L (ref 101–111)
Glucose, Bld: 104 mg/dL — ABNORMAL HIGH (ref 65–99)
Potassium: 4 mmol/L (ref 3.5–5.1)
Sodium: 140 mmol/L (ref 135–145)

## 2014-10-28 LAB — CBC
HCT: 25.8 % — ABNORMAL LOW (ref 35.0–47.0)
HEMOGLOBIN: 7.5 g/dL — AB (ref 12.0–16.0)
MCH: 16.5 pg — AB (ref 26.0–34.0)
MCHC: 29.1 g/dL — AB (ref 32.0–36.0)
MCV: 56.8 fL — ABNORMAL LOW (ref 80.0–100.0)
Platelets: 501 10*3/uL — ABNORMAL HIGH (ref 150–440)
RBC: 4.55 MIL/uL (ref 3.80–5.20)
RDW: 19.5 % — AB (ref 11.5–14.5)
WBC: 9.2 10*3/uL (ref 3.6–11.0)

## 2014-10-28 LAB — TROPONIN I
Troponin I: <0.03 ng/mL (ref <0.031)
Troponin I: <0.03 ng/mL (ref <0.031)

## 2014-10-28 LAB — PREGNANCY, URINE: Preg Test, Ur: NEGATIVE

## 2014-10-28 LAB — ANA W/REFLEX IF POSITIVE: ANA: NEGATIVE

## 2014-10-28 MED ORDER — FERROUS SULFATE 325 (65 FE) MG PO TABS
325.0000 mg | ORAL_TABLET | Freq: Three times a day (TID) | ORAL | Status: DC
  Administered 2014-10-28 – 2014-10-29 (×3): 325 mg via ORAL
  Filled 2014-10-28 (×3): qty 1

## 2014-10-28 MED ORDER — VALPROATE SODIUM 500 MG/5ML IV SOLN
500.0000 mg | Freq: Two times a day (BID) | INTRAVENOUS | Status: AC
  Administered 2014-10-28 (×2): 500 mg via INTRAVENOUS
  Filled 2014-10-28 (×2): qty 5

## 2014-10-28 MED ORDER — AMITRIPTYLINE HCL 25 MG PO TABS
25.0000 mg | ORAL_TABLET | Freq: Every day | ORAL | Status: DC
  Administered 2014-10-28: 25 mg via ORAL
  Filled 2014-10-28: qty 1

## 2014-10-28 MED ORDER — DEXAMETHASONE SODIUM PHOSPHATE 10 MG/ML IJ SOLN
10.0000 mg | Freq: Once | INTRAMUSCULAR | Status: AC
  Administered 2014-10-28: 10 mg via INTRAVENOUS
  Filled 2014-10-28: qty 1

## 2014-10-28 MED ORDER — SUMATRIPTAN SUCCINATE 50 MG PO TABS
50.0000 mg | ORAL_TABLET | ORAL | Status: DC | PRN
  Administered 2014-10-29: 10:00:00 50 mg via ORAL
  Filled 2014-10-28 (×2): qty 1

## 2014-10-28 MED ORDER — GADOBENATE DIMEGLUMINE 529 MG/ML IV SOLN
20.0000 mL | Freq: Once | INTRAVENOUS | Status: AC | PRN
  Administered 2014-10-28: 20 mL via INTRAVENOUS

## 2014-10-28 MED ORDER — LISINOPRIL 20 MG PO TABS
20.0000 mg | ORAL_TABLET | Freq: Every day | ORAL | Status: DC
  Administered 2014-10-29: 09:00:00 20 mg via ORAL
  Filled 2014-10-28: qty 1

## 2014-10-28 MED ORDER — MAGNESIUM SULFATE 2 GM/50ML IV SOLN
2.0000 g | Freq: Once | INTRAVENOUS | Status: AC
  Administered 2014-10-28: 13:00:00 2 g via INTRAVENOUS
  Filled 2014-10-28 (×2): qty 50

## 2014-10-28 MED ORDER — IBUPROFEN 600 MG PO TABS: 600.0000 mg | ORAL_TABLET | Freq: Four times a day (QID) | ORAL | Status: DC | PRN

## 2014-10-28 NOTE — Progress Notes (Signed)
Patient is sleeping at present time, has been medicated for headache x one, iv fluids infusing without difficulty, tel intact and she is nsr. No s/s of distress, boyfriend at bedside, she did complain of bump on back of right leg, states that it hurts, no drainage or discoloration noted, nurse encouraged her to show to MD, she states that she would in am. Patient up to bathroom with minimal assist. V/s stable.

## 2014-10-28 NOTE — Consult Note (Addendum)
CC: headache  HPI: Kelly Byrd is an 39 y.o. female with history HTN and  headaches for the past few months. Headaches described as frontal mostly on L and radiated to L side. They can last multiple days. Associated with N/V. Pt takes daily over the counter tylenol and tylenol PM with little to no relief.     Past Medical History  Diagnosis Date  . Asthma   . Hypertension     Past Surgical History  Procedure Laterality Date  . Cesarean section      2    Family History  Problem Relation Age of Onset  . Hypertension Mother   . Hypertension Father   . Diabetes Mellitus II Maternal Grandmother   . Lupus      Social History:  reports that she has been smoking Cigarettes.  She does not have any smokeless tobacco history on file. She reports that she does not drink alcohol or use illicit drugs.  Allergies  Allergen Reactions  . Peanuts [Peanut Oil]     Medications: I have reviewed the patient's current medications.  ROS: History obtained from the patient  General ROS: negative for - chills, fatigue, fever, night sweats, weight gain or weight loss Psychological ROS: negative for - behavioral disorder, hallucinations, memory difficulties, mood swings or suicidal ideation Ophthalmic ROS: negative for - blurry vision, double vision, eye pain or loss of vision ENT ROS: negative for - epistaxis, nasal discharge, oral lesions, sore throat, tinnitus or vertigo Allergy and Immunology ROS: negative for - hives or itchy/watery eyes Hematological and Lymphatic ROS: negative for - bleeding problems, bruising or swollen lymph nodes Endocrine ROS: negative for - galactorrhea, hair pattern changes, polydipsia/polyuria or temperature intolerance Respiratory ROS: negative for - cough, hemoptysis, shortness of breath or wheezing Cardiovascular ROS: negative for - chest pain, dyspnea on exertion, edema or irregular heartbeat Gastrointestinal ROS: negative for - abdominal pain, diarrhea,  hematemesis, nausea/vomiting or stool incontinence Genito-Urinary ROS: negative for - dysuria, hematuria, incontinence or urinary frequency/urgency Musculoskeletal ROS: negative for - joint swelling or muscular weakness Neurological ROS: as noted in HPI Dermatological ROS: negative for rash and skin lesion changes  Physical Examination: Blood pressure 156/81, pulse 79, temperature 98.1 F (36.7 C), temperature source Oral, resp. rate 18, height 5\' 4"  (1.626 m), weight 115.894 kg (255 lb 8 oz), last menstrual period 10/15/2014, SpO2 99 %.    Neurological Examination Mental Status: Alert, oriented, thought content appropriate.  Speech fluent without evidence of aphasia.  Able to follow 3 step commands without difficulty. Cranial Nerves: II: Discs flat bilaterally; Visual fields grossly normal, pupils equal, round, reactive to light and accommodation III,IV, VI: ptosis not present, extra-ocular motions intact bilaterally V,VII: smile symmetric, facial light touch sensation normal bilaterally VIII: hearing normal bilaterally IX,X: gag reflex present XI: bilateral shoulder shrug XII: midline tongue extension Motor: Right : Upper extremity   5/5    Left:     Upper extremity   5/5  Lower extremity   5/5     Lower extremity   5/5 Tone and bulk:normal tone throughout; no atrophy noted Sensory: Pinprick and light touch intact throughout, bilaterally Deep Tendon Reflexes: 2+ and symmetric throughout Plantars: Right: downgoing   Left: downgoing Cerebellar: normal finger-to-nose, normal rapid alternating movements and normal heel-to-shin test Gait: not tested      Laboratory Studies:   Basic Metabolic Panel:  Recent Labs Lab 10/26/14 2235 10/28/14 0435  NA 138 140  K 3.4* 4.0  CL 105 111  CO2 25 24  GLUCOSE 104* 104*  BUN 10 7  CREATININE 0.65 0.52  CALCIUM 9.5 9.1    Liver Function Tests: No results for input(s): AST, ALT, ALKPHOS, BILITOT, PROT, ALBUMIN in the last 168  hours. No results for input(s): LIPASE, AMYLASE in the last 168 hours. No results for input(s): AMMONIA in the last 168 hours.  CBC:  Recent Labs Lab 10/26/14 2235 10/28/14 0435  WBC 11.4* 9.2  HGB 7.9* 7.5*  HCT 26.6* 25.8*  MCV 56.7* 56.8*  PLT 583* 501*    Cardiac Enzymes:  Recent Labs Lab 10/26/14 2235  TROPONINI 0.04*    BNP: Invalid input(s): POCBNP  CBG: No results for input(s): GLUCAP in the last 168 hours.  Microbiology: No results found for this or any previous visit.  Coagulation Studies: No results for input(s): LABPROT, INR in the last 72 hours.  Urinalysis:  Recent Labs Lab 10/26/14 2224  COLORURINE YELLOW*  LABSPEC 1.015  PHURINE 6.0  GLUCOSEU NEGATIVE  HGBUR NEGATIVE  BILIRUBINUR NEGATIVE  KETONESUR NEGATIVE  PROTEINUR NEGATIVE  NITRITE NEGATIVE  LEUKOCYTESUR NEGATIVE    Lipid Panel:  No results found for: CHOL, TRIG, HDL, CHOLHDL, VLDL, LDLCALC  HgbA1C:  Lab Results  Component Value Date   HGBA1C 6.5* 10/26/2014    Urine Drug Screen:  No results found for: LABOPIA, COCAINSCRNUR, LABBENZ, AMPHETMU, THCU, LABBARB  Alcohol Level: No results for input(s): ETH in the last 168 hours.  Other results: EKG: normal EKG, normal sinus rhythm, unchanged from previous tracings.  Imaging: Ct Head Wo Contrast  10/26/2014   CLINICAL DATA:  Weakness and tingling left side. Started this morning. Fall yesterday with loss of consciousness.  EXAM: CT HEAD WITHOUT CONTRAST  TECHNIQUE: Contiguous axial images were obtained from the base of the skull through the vertex without intravenous contrast.  COMPARISON:  Head CT 09/11/2010  FINDINGS: No intracranial hemorrhage, mass effect, or midline shift. No hydrocephalus. The basilar cisterns are patent. No evidence of territorial infarct. No intracranial fluid collection. Calvarium is intact. Included paranasal sinuses and mastoid air cells are well aerated.  IMPRESSION: No acute intracranial abnormality.    Electronically Signed   By: Jeb Levering M.D.   On: 10/26/2014 22:39   Dg Hip Unilat With Pelvis 2-3 Views Right  10/27/2014   CLINICAL DATA:  Fall down steps 2 days ago.  Right hip pain.  EXAM: DG HIP (WITH OR WITHOUT PELVIS) 2-3V RIGHT  COMPARISON:  CT abdomen and pelvis 07/24/2010  FINDINGS: There is flattening of the right femoral head and coxa magna. 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 This is unchanged since CT from 2012. No acute bony abnormality. No fracture, subluxation or dislocation. Left hip is unremarkable. SI joints are symmetric.  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 No acute findings.   Electronically Signed   By: Rolm Baptise M.D.   On: 10/27/2014 10:04     Assessment/Plan:  39 y.o. female with history HTN and  headaches for the past few months. Headaches described as frontal mostly on L and radiated to L side. They can last multiple days. Associated with N/V. Pt takes daily over the counter tylenol and tylenol PM with little to no relief.     This could be due to uncontrolled HTN, less likely pseudotumor cerebri as it usually presents at much younger age. Very high possibility for medication overuse/oversensatization headache as pt is on daily overcouner tylenol and tylenol PM.    -  MRI brain w/ and w/out contrast due to long hx of HA - 2gm Magnesium now - d/c fioricet as it is not good medication in someone with likely combination of medication overuse headaches - 10 g of decadron now - urine drug screen  - urine pregnancy test now. If negative will Start VPA of 500 BID x 3 days - don't think topamax daily is good prophylactic medication, will start elavil 25 nightly due to poor sleep.    10/28/2014, 10:11 AM    Addendum: pregnancy test negative Started VPA 500 BID x 2 doses MRI empty sella which is incidental finding.  Leotis Pain

## 2014-10-28 NOTE — Plan of Care (Signed)
Problem: Discharge Progression Outcomes Goal: Other Discharge Outcomes/Goals Outcome: Progressing Plan of Care Progress to Goal:  Pt didn't c/o of headache but did c/o of hip pain - had ortho consult and neuro consult.  Gave morphine 2x for pain w/out much relief according to scores. Had MRI of brain and hip.  Also c/o nausea.  She also has bump on R inner thigh she wants the dr to see.  Pt's Hgb 7.5 - may be caused by heavy menstrual periods - pt has fibroids. Dr wants her to have Gyn consult out pt.  Gave iron today.  Fluids d/ced.  For headache, gave valproate, mg.  Pt has imatrex for migraine - d/ced fioricet. Pregnancy test negative. Pt is supposed to get married this weekend.

## 2014-10-28 NOTE — Plan of Care (Signed)
Problem: Discharge Progression Outcomes Goal: Other Discharge Outcomes/Goals Outcome: Progressing Plan of care progress to goal : Patient remains with headache, new medication ordered per MD, fioricet 50-325-40mg  po , also was treated with flexeril 10mg  , she has had some relief.                                                   Labs being monitored, ferritin decreased 5, and sed rate elevated to 37.                                                    Iv fluids infusing with potassium                                                    Up to bathroom with minimal assist.

## 2014-10-28 NOTE — Clinical Social Work Note (Signed)
Clinical Social Worker consulted for PCP and Meds. CSW spoke with [facility], who is following for PCP and medications. Per MD, pt does not need SNF. CSW is signing off as no further needs identified. Please reconsult if a need arises prior to discharge.  Darden Dates, MSW, LCSW Clinical Social Worker [phone]

## 2014-10-28 NOTE — Progress Notes (Signed)
[facility] [place] NAME: Kelly Byrd    MR#:  [id number]  DATE OF BIRTH:  09-28-1975  SUBJECTIVE:  CHIEF COMPLAINT:   Chief Complaint  Patient presents with  . Weakness    Pt presents to ER alert and in NAD. Pt reports she is having weakness and tingling on her left side. Pt states hx of HTN, some non compliance. Pt ambulatory. Pt states she fell yesterday and is having back pain. Clear speech. Moving all extremities normally.  Marland Kitchen Headache   Headache and chest pain improving.  Area of headache is smaller, still with some photophobia.  Left sided chest pain only in sharp bursts.  Right hip pain stable.   REVIEW OF SYSTEMS:   Review of Systems  Constitutional: Positive for malaise/fatigue. Negative for fever.  Eyes: Positive for photophobia.  Respiratory: Negative for shortness of breath.   Cardiovascular: Positive for chest pain and leg swelling. Negative for palpitations.  Gastrointestinal: Positive for nausea. Negative for vomiting and abdominal pain.  Genitourinary: Negative for dysuria.  Musculoskeletal: Positive for neck pain.  Neurological: Positive for sensory change, weakness and headaches.    DRUG ALLERGIES:   Allergies  Allergen Reactions  . Peanuts [Peanut Oil]     VITALS:  Blood pressure 156/81, pulse 79, temperature 98.1 F (36.7 C), temperature source Oral, resp. rate 18, height 5' 4" (1.626 m), weight 115.894 kg (255 lb 8 oz), last menstrual period 10/15/2014, SpO2 99 %.  PHYSICAL EXAMINATION:  GENERAL:  39 y.o.-year-old patient lying in the bed, NAD EYES: Pupils equal, round, reactive to light and accommodation. No scleral icterus. Right eye occasionally deviates to the right HEENT: Head atraumatic, normocephalic. Oropharynx and nasopharynx clear. Mucous membranes are moist NECK:  Supple, no jugular venous distention. No thyroid enlargement, no tenderness.  LUNGS: Normal breath sounds bilaterally,  no wheezing, rales,rhonchi or crepitation. No use of accessory muscles of respiration.  CARDIOVASCULAR: S1, S2 normal. No murmurs, rubs, or gallops.  ABDOMEN: Soft, nontender, nondistended. Bowel sounds present. No organomegaly or mass.  EXTREMITIES: +1 pedal edema, no cyanosis, or clubbing.  NEUROLOGIC: Right eye muscles seem weak, does not track with the left, otherwise cranial nerves are intact. Muscle strength 5/5 in all extremities. Sensation intact, though she has sensation of paresthesias over the left side of her body. Gait not checked.  PSYCHIATRIC: The patient is alert and oriented x 3. Anxious SKIN: No obvious rash, lesion, or ulcer.    LABORATORY PANEL:   CBC  Recent Labs Lab 10/28/14 0435  WBC 9.2  HGB 7.5*  HCT 25.8*  PLT 501*   ------------------------------------------------------------------------------------------------------------------  Chemistries   Recent Labs Lab 10/28/14 0435  NA 140  K 4.0  CL 111  CO2 24  GLUCOSE 104*  BUN 7  CREATININE 0.52  CALCIUM 9.1   ------------------------------------------------------------------------------------------------------------------  Cardiac Enzymes  Recent Labs Lab 10/26/14 2235  TROPONINI 0.04*   ------------------------------------------------------------------------------------------------------------------  RADIOLOGY:  Ct Head Wo Contrast  10/26/2014   CLINICAL DATA:  Weakness and tingling left side. Started this morning. Fall yesterday with loss of consciousness.  EXAM: CT HEAD WITHOUT CONTRAST  TECHNIQUE: Contiguous axial images were obtained from the base of the skull through the vertex without intravenous contrast.  COMPARISON:  Head CT 09/11/2010  FINDINGS: No intracranial hemorrhage, mass effect, or midline shift. No hydrocephalus. The basilar cisterns are patent. No evidence of territorial infarct. No intracranial fluid collection. Calvarium is intact. Included paranasal sinuses and mastoid  air cells  are well aerated.  IMPRESSION: No acute intracranial abnormality.   Electronically Signed   By: Jeb Levering M.D.   On: 10/26/2014 22:39   Dg Hip Unilat With Pelvis 2-3 Views Right  10/27/2014   CLINICAL DATA:  Fall down steps 2 days ago.  Right hip pain.  EXAM: DG HIP (WITH OR WITHOUT PELVIS) 2-3V RIGHT  COMPARISON:  CT abdomen and pelvis 07/24/2010  FINDINGS: There is flattening of the right femoral head and coxa magna. 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 This is unchanged since CT from 2012. No acute bony abnormality. No fracture, subluxation or dislocation. Left hip is unremarkable. SI joints are symmetric.  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 No acute findings.   Electronically Signed   By: Rolm Baptise M.D.   On: 10/27/2014 10:04    EKG:   Orders placed or performed during the hospital encounter of 10/26/14  . ED EKG  . ED EKG  . EKG 12-Lead  . EKG 12-Lead  . EKG 12-Lead  . EKG 12-Lead    ASSESSMENT AND PLAN:   #1 headache: Possibly atypical migraine with left sided paresthesias. Agree with differential as outlined by Dr. Marcille [facility] as well. Neurology consultation pending. ESR slightly elevated at 37, ANA with reflex pending.  Will add imitrex with ibuprofen today.  #2 chest pain: Seems to be musculoskeletal given its sharp nature and worsening with movement of the left arm. There is also involvement of left upper back muscles. PT has not commented on this. Concern given her anemia for cardiac strain, will cycle CE's again. No change on EKG  #3 hypertension: BP elevated and possibly contributing to headache. Continue amlodipine and increase lisinopril. Amlodipine could cause her lower extremity edema.   #4 anemia: severely iron deficient with ferritin of 5.  Reports heavy menses.  Start iron. Needs GYN referral on dc  #5 right hip pain: right hip xray suggestive of AVN. Will consult ortho.   #6  prophylaxis heparin for DVT prophylaxis, no GI prophylaxis  CODE STATUS: Full  TOTAL TIME TAKING CARE OF THIS PATIENT: 35 minutes.  Greater than 50% of time spent in care coordination and counseling. Spoke with the patient at the bedside. POSSIBLE D/C IN 1-2 DAYS, DEPENDING ON CLINICAL CONDITION.  Myrtis Ser M.D on 10/28/2014 at 10:11 AM  Between 7am to 6pm - Pager - [phone]  After 6pm go to [email or web address] - password [email or web address] [facility]  [facility]  Office  [phone]  CC: Primary care physician; No PCP Per Patient

## 2014-10-28 NOTE — Consult Note (Signed)
ORTHOPAEDIC CONSULTATION  REQUESTING PHYSICIAN: Aldean Jewett, MD  Chief Complaint: Right hip pain  HPI: Kelly Byrd is a 39 y.o. female who complains of  right hip pain. Patient states this has been chronic. There has been no acute injury. She has been evaluated by Drs. and Baldo Ash. She has relocated here to [place]. Patient states that she has pain in the right hip which causes low back and right knee pain as well. Patient states that she feels her right leg shorter than the left. She did have an injury as a child to her right hip. She does not know details about this injury.  Past Medical History  Diagnosis Date  . Asthma   . Hypertension    Past Surgical History  Procedure Laterality Date  . Cesarean section      2   History   Social History  . Marital Status: Single    Spouse Name: N/A  . Number of Children: N/A  . Years of Education: N/A   Social History Main Topics  . Smoking status: Current Every Day Smoker    Types: Cigarettes  . Smokeless tobacco: Not on file  . Alcohol Use: No  . Drug Use: No  . Sexual Activity: Yes   Other Topics Concern  . None   Social History Narrative   Family History  Problem Relation Age of Onset  . Hypertension Mother   . Hypertension Father   . Diabetes Mellitus II Maternal Grandmother   . Lupus     Allergies  Allergen Reactions  . Peanuts [Peanut Oil]    Prior to Admission medications   Medication Sig Start Date End Date Taking? Authorizing Provider  albuterol (PROVENTIL HFA;VENTOLIN HFA) 108 (90 BASE) MCG/ACT inhaler Inhale 2 puffs into the lungs every 6 (six) hours as needed for wheezing or shortness of breath (wheezing).   Yes Historical Provider, MD  albuterol (PROVENTIL) (2.5 MG/3ML) 0.083% nebulizer solution Take 3 mLs (2.5 mg total) by nebulization every 6 (six) hours as needed for wheezing or shortness of breath (wheezing). 06/27/14  Yes Daleen Bo, MD  amLODipine (NORVASC) 10 MG tablet Take 1  tablet (10 mg total) by mouth daily. 06/27/14  Yes Daleen Bo, MD  cyclobenzaprine (FLEXERIL) 10 MG tablet Take 1 tablet (10 mg total) by mouth every 8 (eight) hours as needed for muscle spasms (PRN pain. Do not drive or operate heavy machinery while taking as can cause drowsiness.). 10/02/14  Yes Marylene Land, NP  ibuprofen (ADVIL,MOTRIN) 800 MG tablet Take 1 tablet (800 mg total) by mouth every 8 (eight) hours as needed for mild pain or moderate pain. 10/02/14  Yes Marylene Land, NP  lisinopril (PRINIVIL,ZESTRIL) 10 MG tablet Take 1 tablet (10 mg total) by mouth daily. 06/27/14  Yes Daleen Bo, MD  mometasone-formoterol [facility]) 200-5 MCG/ACT AERO Inhale 2 puffs into the lungs 2 (two) times daily.   Yes Historical Provider, MD  montelukast (SINGULAIR) 10 MG tablet Take 1 tablet (10 mg total) by mouth at bedtime. 06/27/14  Yes Daleen Bo, MD  azithromycin (ZITHROMAX Z-PAK) 250 MG tablet 2 tablets day 1;    1 tablet day 2 through 5 09/28/14   Nat Christen, MD  HYDROcodone-acetaminophen [facility]) 5-325 MG per tablet Take 1 tablet by mouth every 4 (four) hours as needed. 06/27/14   Daleen Bo, MD  oxyCODONE-acetaminophen (PERCOCET/ROXICET) 5-325 MG per tablet Take 1-2 tablets by mouth every 6 (six) hours as needed. 09/28/14   Nat Christen, MD  predniSONE ([place])  50 MG tablet 1 tablet daily for 5 days, one half tablet daily for 5 days 09/28/14   Nat Christen, MD   Ct Head Wo Contrast  10/26/2014   CLINICAL DATA:  Weakness and tingling left side. Started this morning. Fall yesterday with loss of consciousness.  EXAM: CT HEAD WITHOUT CONTRAST  TECHNIQUE: Contiguous axial images were obtained from the base of the skull through the vertex without intravenous contrast.  COMPARISON:  Head CT 09/11/2010  FINDINGS: No intracranial hemorrhage, mass effect, or midline shift. No hydrocephalus. The basilar cisterns are patent. No evidence of territorial infarct. No intracranial fluid collection. Calvarium is intact.  Included paranasal sinuses and mastoid air cells are well aerated.  IMPRESSION: No acute intracranial abnormality.   Electronically Signed   By: Kelly Byrd M.D.   On: 10/26/2014 22:39   Mr [name] NK Contrast  10/28/2014   CLINICAL DATA:  39 year old female with headaches for several months, mostly on the left side frontal region. Associated left side tingling. Hypertension. Initial encounter.  EXAM: MRI HEAD WITHOUT AND WITH CONTRAST  TECHNIQUE: Multiplanar, multiecho pulse sequences of the brain and surrounding structures were obtained without and with intravenous contrast.  CONTRAST:  47mL MULTIHANCE GADOBENATE DIMEGLUMINE 529 MG/ML IV SOLN  COMPARISON:  Head CT without contrast 10/26/2014 and earlier.  FINDINGS: Normal cerebral volume. Major intracranial vascular flow voids are within normal limits. No restricted diffusion to suggest acute infarction. No midline shift, mass effect, evidence of mass lesion, ventriculomegaly, extra-axial collection or acute intracranial hemorrhage. Cervicomedullary junction within normal limits. Partially empty sella configuration.  Pearline Cables and white matter signal is within normal limits throughout the brain. No abnormal enhancement identified. No dural thickening.  Visible internal auditory structures appear normal. Mastoids are clear. Small left maxillary sinus mucous retention cyst. Minimal left sphenoid sinus mucosal thickening. Orbits soft tissues appear normal. Negative scalp soft tissues. Negative visualized cervical spine.  IMPRESSION: 1. Partially empty sella which can be a normal anatomic variant or be associated with idiopathic intracranial hypertension (pseudotumor cerebri). 2. Otherwise normal MRI appearance of the brain.   Electronically Signed   By: Kelly Byrd M.D.   On: 10/28/2014 12:21   Dg Hip Unilat With Pelvis 2-3 Views Right  10/27/2014   CLINICAL DATA:  Fall down steps 2 days ago.  Right hip pain.  EXAM: DG HIP (WITH OR WITHOUT PELVIS) 2-3V RIGHT   COMPARISON:  CT abdomen and pelvis 07/24/2010  FINDINGS: There is flattening of the right femoral head and coxa magna. 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 This is unchanged since CT from 2012. No acute bony abnormality. No fracture, subluxation or dislocation. Left hip is unremarkable. SI joints are symmetric.  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 No acute findings.   Electronically Signed   By: Rolm Baptise M.D.   On: 10/27/2014 10:04    Positive ROS: All other systems have been reviewed and were otherwise negative with the exception of those mentioned in the HPI and as above.  Physical Exam: General: Alert, no acute distress  MUSCULOSKELETAL: Right hip: Patient is examined sitting beside of her hospital bed. She has mild to moderate pain with internal and external rotation of right hip. She however hasn't no significant rotational loss on exam. Patient can flex her right hip to approximately 110 versus 130 on the left side. She has intact sensation to light touch, palpable pedal pulses and intact motor function in both lower extremity.  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 The radiologist questioned avascular necrosis. The findings on these plain films are not significantly different than a previous CT scan. I would recommend an MRI of the right hip to further evaluate for AVN. Depending on the stage of AVM the patient may be a candidate for fibular grafting and at [facility] as an outpatient. Patient may proceed with physical therapy as her pain allows. She may require a shoe lift to help even out her leg lengths. This can be arranged as an outpatient. She may follow up my office in 2 weeks following discharge.    [place], MD    10/28/2014 1:35 PM

## 2014-10-28 NOTE — Progress Notes (Signed)
Physical Therapy Treatment Patient Details Name: Kelly Byrd MRN: [id number] DOB: Jul 26, 1975 Today's Date: 10/28/2014    History of Present Illness Patient is a 39 y.o. female admitted for intractable HA.     PT Comments    Pt making very good progress towards goals this date. She ambulates with supervision and RW 220 ft, transfers with modified indep, and performs bed mobility independently. She states hx of falls and will benefit from further skilled PT in order address any balance deficits as well as her hip pain which is causing an antalgic gait deviation. She agreed with the changed recommendation to go home with outpatient PT.  Follow Up Recommendations  Outpatient PT     Equipment Recommendations  Rolling walker with 5" wheels    Recommendations for Other Services       Precautions / Restrictions Precautions Precautions: Fall Restrictions Weight Bearing Restrictions: No    Mobility  Bed Mobility Overal bed mobility: Independent             General bed mobility comments: Needs no assistance   Transfers Overall transfer level: Modified independent Equipment used: Rolling walker (2 wheeled)             General transfer comment: Pt needs cues for hand placement prior to transfer, however she demonstrates good functional strength with stand and lowering, with no noted LOB  Ambulation/Gait Ambulation/Gait assistance: Supervision Ambulation Distance (Feet): 220 Feet Assistive device: Rolling walker (2 wheeled) Gait Pattern/deviations: Antalgic (R hip) Gait velocity: decreased   General Gait Details: Pt favors R hip secondary to pain. She notes mild chest pain during ambulation that she says she's had for the past year non stop. She said it did not increase with ambulation, just that it's always there   Stairs            Wheelchair Mobility    Modified Rankin (Stroke Patients Only)       Balance Overall balance assessment: History of  Falls                                  Cognition Arousal/Alertness: Awake/alert Behavior During Therapy: WFL for tasks assessed/performed Overall Cognitive Status: Within Functional Limits for tasks assessed                      Exercises Other Exercises Other Exercises: Pt performed standing balance activities with CGA for safety x 10 minutes. Standing with eyes closed (feet together), semi tandem (eyes open and closed), SLS (min assist for balance correction), and dynamic reaching outside BOS.     General Comments        Pertinent Vitals/Pain Pain Assessment: 0-10 Pain Score: 8  Pain Location: head (occipital region) Pain Intervention(s): Limited activity within patient's tolerance;Monitored during session;Premedicated before session    Home Living                      Prior Function            PT Goals (current goals can now be found in the care plan section) Acute Rehab PT Goals Patient Stated Goal: to go home PT Goal Formulation: With patient Time For Goal Achievement: 11/10/14 Potential to Achieve Goals: Good Progress towards PT goals: Progressing toward goals    Frequency  Min 2X/week    PT Plan Discharge plan needs to be updated    Co-evaluation  End of Session Equipment Utilized During Treatment: Gait belt Activity Tolerance: Patient tolerated treatment well Patient left: in bed;with call bell/phone within reach;with family/visitor present;with chair alarm set     Time: 1420-1445 PT Time Calculation (min) (ACUTE ONLY): 25 min  Charges:                       G CodesJanyth Contes November 21, 2014, 4:15 PM  Janyth Contes, SPT. [phone]

## 2014-10-28 NOTE — Care Management (Signed)
Admitted to [facility] with the diagnosis of cluster headache. Lives with friend, Montine Circle [phone]). Mother, Janyth Contes, [phone]). Takes care of all activities of daily living herself. Seen  Dannial Monarch, FNP in [place] January 2016. Never been to the [facility] or [facility]. Will give applications to both clinic today. Discussed that this case manager would let Anell Barr at the [facility] know that she is a patient at [facility]. States her friend  will transport.  Physical therapy evaluation completed.  Ambulated 50 feet. Recommends skilled nursing facility. Ms. [name] indicated that she could do better with physical therapy and would not need skilled nursing. Shelbie Ammons RN MSN Care Management [phone]

## 2014-10-28 NOTE — Progress Notes (Signed)
PT Cancellation Note  Patient Details Name: Kelly Byrd MRN: [id number] DOB: 1976/02/24   Cancelled Treatment:    Reason Eval/Treat Not Completed: Medical issues which prohibited therapy (see PT cancellation note for further details). Per chart review, pt now with pending ortho consult for possible hip AVN. Will hold at this time until consult complete. Will re-attempt in PM.   [name] 10/28/2014, 11:09 AM   Greggory Stallion, PT, DPT [phone]

## 2014-10-29 DIAGNOSIS — D509 Iron deficiency anemia, unspecified: Secondary | ICD-10-CM | POA: Diagnosis present

## 2014-10-29 DIAGNOSIS — M93003 Unspecified slipped upper femoral epiphysis (nontraumatic), unspecified hip: Secondary | ICD-10-CM | POA: Diagnosis present

## 2014-10-29 DIAGNOSIS — G44021 Chronic cluster headache, intractable: Secondary | ICD-10-CM

## 2014-10-29 DIAGNOSIS — N921 Excessive and frequent menstruation with irregular cycle: Secondary | ICD-10-CM | POA: Diagnosis present

## 2014-10-29 LAB — BASIC METABOLIC PANEL
ANION GAP: 9 (ref 5–15)
BUN: 8 mg/dL (ref 6–20)
CO2: 17 mmol/L — ABNORMAL LOW (ref 22–32)
Calcium: 9.7 mg/dL (ref 8.9–10.3)
Chloride: 106 mmol/L (ref 101–111)
Creatinine, Ser: 0.77 mg/dL (ref 0.44–1.00)
GFR calc non Af Amer: >60 mL/min (ref >60)
GLUCOSE: 315 mg/dL — AB (ref 65–99)
Potassium: 4.7 mmol/L (ref 3.5–5.1)
Sodium: 132 mmol/L — ABNORMAL LOW (ref 135–145)

## 2014-10-29 LAB — CBC
HEMATOCRIT: 28.9 % — AB (ref 35.0–47.0)
Hemoglobin: 8.3 g/dL — ABNORMAL LOW (ref 12.0–16.0)
MCH: 16.5 pg — ABNORMAL LOW (ref 26.0–34.0)
MCHC: 28.6 g/dL — ABNORMAL LOW (ref 32.0–36.0)
MCV: 57.7 fL — ABNORMAL LOW (ref 80.0–100.0)
PLATELETS: 561 10*3/uL — AB (ref 150–440)
RBC: 5.01 MIL/uL (ref 3.80–5.20)
RDW: 19.7 % — AB (ref 11.5–14.5)
WBC: 25.1 10*3/uL — ABNORMAL HIGH (ref 3.6–11.0)

## 2014-10-29 LAB — TROPONIN I: Troponin I: <0.03 ng/mL (ref <0.031)

## 2014-10-29 MED ORDER — SUMATRIPTAN SUCCINATE 50 MG PO TABS: 50.0000 mg | ORAL_TABLET | ORAL | Status: DC | PRN

## 2014-10-29 MED ORDER — MUPIROCIN 2 % EX OINT: TOPICAL_OINTMENT | Freq: Two times a day (BID) | CUTANEOUS | Status: DC

## 2014-10-29 MED ORDER — HYDROCODONE-ACETAMINOPHEN 5-325 MG PO TABS: 1.0000 | ORAL_TABLET | Freq: Four times a day (QID) | ORAL | Status: DC | PRN

## 2014-10-29 MED ORDER — MOMETASONE FURO-FORMOTEROL FUM 200-5 MCG/ACT IN AERO: 2.0000 | INHALATION_SPRAY | Freq: Two times a day (BID) | RESPIRATORY_TRACT | Status: DC

## 2014-10-29 MED ORDER — AMLODIPINE BESYLATE 10 MG PO TABS
10.0000 mg | ORAL_TABLET | Freq: Every day | ORAL | Status: DC
Start: 2014-10-29 — End: 2015-06-17

## 2014-10-29 MED ORDER — MONTELUKAST SODIUM 10 MG PO TABS: 10.0000 mg | ORAL_TABLET | Freq: Every day | ORAL | Status: DC

## 2014-10-29 MED ORDER — LISINOPRIL 20 MG PO TABS: 20.0000 mg | ORAL_TABLET | Freq: Every day | ORAL | Status: DC

## 2014-10-29 MED ORDER — AMITRIPTYLINE HCL 25 MG PO TABS: 25.0000 mg | ORAL_TABLET | Freq: Every day | ORAL | Status: DC

## 2014-10-29 MED ORDER — CYCLOBENZAPRINE HCL 10 MG PO TABS: 10.0000 mg | ORAL_TABLET | Freq: Three times a day (TID) | ORAL | Status: DC | PRN

## 2014-10-29 MED ORDER — ALBUTEROL SULFATE HFA 108 (90 BASE) MCG/ACT IN AERS: 2.0000 | INHALATION_SPRAY | Freq: Four times a day (QID) | RESPIRATORY_TRACT | Status: DC | PRN

## 2014-10-29 MED ORDER — MUPIROCIN 2 % EX OINT
TOPICAL_OINTMENT | Freq: Two times a day (BID) | CUTANEOUS | Status: DC
  Administered 2014-10-29: 11:00:00 via TOPICAL
  Filled 2014-10-29: qty 22

## 2014-10-29 MED ORDER — FERROUS SULFATE 325 (65 FE) MG PO TABS: 325.0000 mg | ORAL_TABLET | Freq: Three times a day (TID) | ORAL | Status: DC

## 2014-10-29 MED ORDER — POLYETHYLENE GLYCOL 3350 17 G PO PACK
17.0000 g | PACK | Freq: Two times a day (BID) | ORAL | Status: DC
  Administered 2014-10-29: 17 g via ORAL
  Filled 2014-10-29: qty 1

## 2014-10-29 NOTE — Progress Notes (Signed)
Pt being discharged home, discharge instructions and prescriptions reviewed with pt, states understanding, rolling walker delivered to pt, pt to schedule a follow up appt in 2 weeks with orthopedic MD, no noted complaints at discharge

## 2014-10-29 NOTE — Plan of Care (Signed)
Problem: Discharge Progression Outcomes Goal: Other Discharge Outcomes/Goals Outcome: Progressing Plan of Care Progress to Goal:  Pt's Hgb came up to 8.3 from 7.5 yesterday.  Troponins all WNL .03.  Tele d/ced.  Gave imatrex for headache.  Pt needs to f/u outpt w/primary care physician, orthopedic and GYN.  Was given info on [facility] and [facility].  Pt ambulating steadily w/rolling walker.  Pt rec'd scripts on almost all home meds and rec'd new walker.  IV removed.  Pt d/ced home.

## 2014-10-29 NOTE — Discharge Instructions (Signed)
[facility]. They will need to refer you to a gynecologist within their network. They can also help you with medication access. Follow-up with physical therapy.   DIET:  Regular diet  DISCHARGE CONDITION:  Stable  ACTIVITY:  Activity as tolerated  OXYGEN:  Home Oxygen: No.   Oxygen Delivery: room air  DISCHARGE LOCATION:  home   If you experience worsening of your admission symptoms, develop shortness of breath, life threatening emergency, suicidal or homicidal thoughts you must seek medical attention immediately by calling 911 or calling your MD immediately  if symptoms less severe.  You Must read complete instructions/literature along with all the possible adverse reactions/side effects for all the Medicines you take and that have been prescribed to you. Take any new Medicines after you have completely understood and accpet all the possible adverse reactions/side effects.   Please note  You were cared for by a hospitalist during your hospital stay. If you have any questions about your discharge medications or the care you received while you were in the hospital after you are discharged, you can call the unit and asked to speak with the hospitalist on call if the hospitalist that took care of you is not available. Once you are discharged, your primary care physician will handle any further medical issues. Please note that NO REFILLS for any discharge medications will be authorized once you are discharged, as it is imperative that you return to your primary care physician (or establish a relationship with a primary care physician if you do not have one) for your aftercare needs so that they can reassess your need for medications and monitor your lab values.      Headaches, Frequently Asked Questions MIGRAINE HEADACHES Q: What is migraine? What causes it? How can I treat it? A: Generally, migraine headaches begin as a dull ache. Then they  develop into a constant, throbbing, and pulsating pain. You may experience pain at the temples. You may experience pain at the front or back of one or both sides of the head. The pain is usually accompanied by a combination of:  Nausea.  Vomiting.  Sensitivity to light and noise. Some people (about 15%) experience an aura (see below) before an attack. The cause of migraine is believed to be chemical reactions in the brain. Treatment for migraine may include over-the-counter or prescription medications. It may also include self-help techniques. These include relaxation training and biofeedback.  Q: What is an aura? A: About 15% of people with migraine get an "aura". This is a sign of neurological symptoms that occur before a migraine headache. You may see wavy or jagged lines, dots, or flashing lights. You might experience tunnel vision or blind spots in one or both eyes. The aura can include visual or auditory hallucinations (something imagined). It may include disruptions in smell (such as strange odors), taste or touch. Other symptoms include:  Numbness.  A "pins and needles" sensation.  Difficulty in recalling or speaking the correct word. These neurological events may last as long as 60 minutes. These symptoms will fade as the headache begins. Q: What is a trigger? A: Certain physical or environmental factors can lead to or "trigger" a migraine. These include:  Foods.  Hormonal changes.  Weather.  Stress. It is important to remember that triggers are different for everyone. To help prevent migraine attacks, you need to figure out which triggers affect you. Keep a headache diary. This is a good way  to track triggers. The diary will help you talk to your healthcare professional about your condition. Q: Does weather affect migraines? A: Bright sunshine, hot, humid conditions, and drastic changes in barometric pressure may lead to, or "trigger," a migraine attack in some people. But  studies have shown that weather does not act as a trigger for everyone with migraines. Q: What is the link between migraine and hormones? A: Hormones start and regulate many of your body's functions. Hormones keep your body in balance within a constantly changing environment. The levels of hormones in your body are unbalanced at times. Examples are during menstruation, pregnancy, or menopause. That can lead to a migraine attack. In fact, about three quarters of all women with migraine report that their attacks are related to the menstrual cycle.  Q: Is there an increased risk of stroke for migraine sufferers? A: The likelihood of a migraine attack causing a stroke is very remote. That is not to say that migraine sufferers cannot have a stroke associated with their migraines. In persons under age 71, the most common associated factor for stroke is migraine headache. But over the course of a person's normal life span, the occurrence of migraine headache may actually be associated with a reduced risk of dying from cerebrovascular disease due to stroke.  Q: What are acute medications for migraine? A: Acute medications are used to treat the pain of the headache after it has started. Examples over-the-counter medications, NSAIDs, ergots, and triptans.  Q: What are the triptans? A: Triptans are the newest class of abortive medications. They are specifically targeted to treat migraine. Triptans are vasoconstrictors. They moderate some chemical reactions in the brain. The triptans work on receptors in your brain. Triptans help to restore the balance of a neurotransmitter called serotonin. Fluctuations in levels of serotonin are thought to be a main cause of migraine.  Q: Are over-the-counter medications for migraine effective? A: Over-the-counter, or "OTC," medications may be effective in relieving mild to moderate pain and associated symptoms of migraine. But you should see your caregiver before beginning any  treatment regimen for migraine.  Q: What are preventive medications for migraine? A: Preventive medications for migraine are sometimes referred to as "prophylactic" treatments. They are used to reduce the frequency, severity, and length of migraine attacks. Examples of preventive medications include antiepileptic medications, antidepressants, beta-blockers, calcium channel blockers, and NSAIDs (nonsteroidal anti-inflammatory drugs). Q: Why are anticonvulsants used to treat migraine? A: During the past few years, there has been an increased interest in antiepileptic drugs for the prevention of migraine. They are sometimes referred to as "anticonvulsants". Both epilepsy and migraine may be caused by similar reactions in the brain.  Q: Why are antidepressants used to treat migraine? A: Antidepressants are typically used to treat people with depression. They may reduce migraine frequency by regulating chemical levels, such as serotonin, in the brain.  Q: What alternative therapies are used to treat migraine? A: The term "alternative therapies" is often used to describe treatments considered outside the scope of conventional Western medicine. Examples of alternative therapy include acupuncture, acupressure, and yoga. Another common alternative treatment is herbal therapy. Some herbs are believed to relieve headache pain. Always discuss alternative therapies with your caregiver before proceeding. Some herbal products contain arsenic and other toxins. TENSION HEADACHES Q: What is a tension-type headache? What causes it? How can I treat it? A: Tension-type headaches occur randomly. They are often the result of temporary stress, anxiety, fatigue, or anger. Symptoms include soreness in  your temples, a tightening band-like sensation around your head (a "vice-like" ache). Symptoms can also include a pulling feeling, pressure sensations, and contracting head and neck muscles. The headache begins in your forehead,  temples, or the back of your head and neck. Treatment for tension-type headache may include over-the-counter or prescription medications. Treatment may also include self-help techniques such as relaxation training and biofeedback. CLUSTER HEADACHES Q: What is a cluster headache? What causes it? How can I treat it? A: Cluster headache gets its name because the attacks come in groups. The pain arrives with little, if any, warning. It is usually on one side of the head. A tearing or bloodshot eye and a runny nose on the same side of the headache may also accompany the pain. Cluster headaches are believed to be caused by chemical reactions in the brain. They have been described as the most severe and intense of any headache type. Treatment for cluster headache includes prescription medication and oxygen. SINUS HEADACHES Q: What is a sinus headache? What causes it? How can I treat it? A: When a cavity in the bones of the face and skull (a sinus) becomes inflamed, the inflammation will cause localized pain. This condition is usually the result of an allergic reaction, a tumor, or an infection. If your headache is caused by a sinus blockage, such as an infection, you will probably have a fever. An x-ray will confirm a sinus blockage. Your caregiver's treatment might include antibiotics for the infection, as well as antihistamines or decongestants.  REBOUND HEADACHES Q: What is a rebound headache? What causes it? How can I treat it? A: A pattern of taking acute headache medications too often can lead to a condition known as "rebound headache." A pattern of taking too much headache medication includes taking it more than 2 days per week or in excessive amounts. That means more than the label or a caregiver advises. With rebound headaches, your medications not only stop relieving pain, they actually begin to cause headaches. Doctors treat rebound headache by tapering the medication that is being overused. Sometimes  your caregiver will gradually substitute a different type of treatment or medication. Stopping may be a challenge. Regularly overusing a medication increases the potential for serious side effects. Consult a caregiver if you regularly use headache medications more than 2 days per week or more than the label advises. ADDITIONAL QUESTIONS AND ANSWERS Q: What is biofeedback? A: Biofeedback is a self-help treatment. Biofeedback uses special equipment to monitor your body's involuntary physical responses. Biofeedback monitors:  Breathing.  Pulse.  Heart rate.  Temperature.  Muscle tension.  Brain activity. Biofeedback helps you refine and perfect your relaxation exercises. You learn to control the physical responses that are related to stress. Once the technique has been mastered, you do not need the equipment any more. Q: Are headaches hereditary? A: Four out of five (80%) of people that suffer report a family history of migraine. Scientists are not sure if this is genetic or a family predisposition. Despite the uncertainty, a child has a 50% chance of having migraine if one parent suffers. The child has a 75% chance if both parents suffer.  Q: Can children get headaches? A: By the time they reach high school, most young people have experienced some type of headache. Many safe and effective approaches or medications can prevent a headache from occurring or stop it after it has begun.  Q: What type of doctor should I see to diagnose and treat my headache?  A: Start with your primary caregiver. Discuss his or her experience and approach to headaches. Discuss methods of classification, diagnosis, and treatment. Your caregiver may decide to recommend you to a headache specialist, depending upon your symptoms or other physical conditions. Having diabetes, allergies, etc., may require a more comprehensive and inclusive approach to your headache. The [employer] will provide, upon request,  a list of [facility] physician members in your state. Document Released: 06/05/2003 Document Revised: 06/07/2011 Document Reviewed: 11/13/2007 [facility] Patient Information 2015 [place], [place]. This information is not intended to replace advice given to you by your health care provider. Make sure you discuss any questions you have with your health care provider.  General Headache Without Cause A general headache is pain or discomfort felt around the head or neck area. The cause may not be found.  HOME CARE   Keep all doctor visits.  Only take medicines as told by your doctor.  Lie down in a dark, quiet room when you have a headache.  Keep a journal to find out if certain things bring on headaches. For example, write down:  What you eat and drink.  How much sleep you get.  Any change to your diet or medicines.  Relax by getting a massage or doing other relaxing activities.  Put ice or heat packs on the head and neck area as told by your doctor.  Lessen stress.  Sit up straight. Do not tighten (tense) your muscles.  Quit smoking if you smoke.  Lessen how much alcohol you drink.  Lessen how much caffeine you drink, or stop drinking caffeine.  Eat and sleep on a regular schedule.  Get 7 to 9 hours of sleep, or as told by your doctor.  Keep lights dim if bright lights bother you or make your headaches worse. GET HELP RIGHT AWAY IF:   Your headache becomes really bad.  You have a fever.  You have a stiff neck.  You have trouble seeing.  Your muscles are weak, or you lose muscle control.  You lose your balance or have trouble walking.  You feel like you will pass out (faint), or you pass out.  You have really bad symptoms that are different than your first symptoms.  You have problems with the medicines given to you by your doctor.  Your medicines do not work.  Your headache feels different than the other headaches.  You feel sick to your stomach (nauseous) or throw  up (vomit). MAKE SURE YOU:   Understand these instructions.  Will watch your condition.  Will get help right away if you are not doing well or get worse. Document Released: 12/23/2007 Document Revised: 06/07/2011 Document Reviewed: 03/05/2011 [facility] Patient Information 2015 [place], [place]. This information is not intended to replace advice given to you by your health care provider. Make sure you discuss any questions you have with your health care provider.  Migraine Headache A migraine headache is very bad, throbbing pain on one or both sides of your head. Talk to your doctor about what things may bring on (trigger) your migraine headaches. HOME CARE  Only take medicines as told by your doctor.  Lie down in a dark, quiet room when you have a migraine.  Keep a journal to find out if certain things bring on migraine headaches. For example, write down:  What you eat and drink.  How much sleep you get.  Any change to your diet or medicines.  Lessen how much alcohol you drink.  Quit smoking if you smoke.  Get enough sleep.  Lessen any stress in your life.  Keep lights dim if bright lights bother you or make your migraines worse. GET HELP RIGHT AWAY IF:   Your migraine becomes really bad.  You have a fever.  You have a stiff neck.  You have trouble seeing.  Your muscles are weak, or you lose muscle control.  You lose your balance or have trouble walking.  You feel like you will pass out (faint), or you pass out.  You have really bad symptoms that are different than your first symptoms. MAKE SURE YOU:   Understand these instructions.  Will watch your condition.  Will get help right away if you are not doing well or get worse. Document Released: 12/23/2007 Document Revised: 06/07/2011 Document Reviewed: 11/20/2012 [facility] Patient Information 2015 [place], [place]. This information is not intended to replace advice given to you by your health care provider.  Make sure you discuss any questions you have with your health care provider.

## 2014-10-29 NOTE — Consult Note (Signed)
CC: headache  HPI: Kelly Byrd is an 39 y.o. female with history HTN and  headaches for the past few months. Headaches described as frontal mostly on L and radiated to L side. They can last multiple days. Associated with N/V. Pt takes daily over the counter tylenol and tylenol PM with little to no relief.     Symptoms much improved.   Past Medical History  Diagnosis Date  . Asthma   . Hypertension     Past Surgical History  Procedure Laterality Date  . Cesarean section      2    Family History  Problem Relation Age of Onset  . Hypertension Mother   . Hypertension Father   . Diabetes Mellitus II Maternal Grandmother   . Lupus      Social History:  reports that she has been smoking Cigarettes.  She does not have any smokeless tobacco history on file. She reports that she does not drink alcohol or use illicit drugs.  Allergies  Allergen Reactions  . Peanuts [Peanut Oil]     Medications: I have reviewed the patient's current medications.  ROS: History obtained from the patient  General ROS: negative for - chills, fatigue, fever, night sweats, weight gain or weight loss Psychological ROS: negative for - behavioral disorder, hallucinations, memory difficulties, mood swings or suicidal ideation Ophthalmic ROS: negative for - blurry vision, double vision, eye pain or loss of vision ENT ROS: negative for - epistaxis, nasal discharge, oral lesions, sore throat, tinnitus or vertigo Allergy and Immunology ROS: negative for - hives or itchy/watery eyes Hematological and Lymphatic ROS: negative for - bleeding problems, bruising or swollen lymph nodes Endocrine ROS: negative for - galactorrhea, hair pattern changes, polydipsia/polyuria or temperature intolerance Respiratory ROS: negative for - cough, hemoptysis, shortness of breath or wheezing Cardiovascular ROS: negative for - chest pain, dyspnea on exertion, edema or irregular heartbeat Gastrointestinal ROS: negative for  - abdominal pain, diarrhea, hematemesis, nausea/vomiting or stool incontinence Genito-Urinary ROS: negative for - dysuria, hematuria, incontinence or urinary frequency/urgency Musculoskeletal ROS: negative for - joint swelling or muscular weakness Neurological ROS: as noted in HPI Dermatological ROS: negative for rash and skin lesion changes  Physical Examination: Blood pressure 162/76, pulse 99, temperature 97.8 F (36.6 C), temperature source Oral, resp. rate 18, height 5\' 4"  (1.626 m), weight 110.133 kg (242 lb 12.8 oz), last menstrual period 10/15/2014, SpO2 100 %.    Neurological Examination Mental Status: Alert, oriented, thought content appropriate.  Speech fluent without evidence of aphasia.  Able to follow 3 step commands without difficulty. Cranial Nerves: II: Discs flat bilaterally; Visual fields grossly normal, pupils equal, round, reactive to light and accommodation III,IV, VI: ptosis not present, extra-ocular motions intact bilaterally V,VII: smile symmetric, facial light touch sensation normal bilaterally VIII: hearing normal bilaterally IX,X: gag reflex present XI: bilateral shoulder shrug XII: midline tongue extension Motor: Right : Upper extremity   5/5    Left:     Upper extremity   5/5  Lower extremity   5/5     Lower extremity   5/5 Tone and bulk:normal tone throughout; no atrophy noted Sensory: Pinprick and light touch intact throughout, bilaterally Deep Tendon Reflexes: 2+ and symmetric throughout Plantars: Right: downgoing   Left: downgoing Cerebellar: normal finger-to-nose, normal rapid alternating movements and normal heel-to-shin test Gait: not tested      Laboratory Studies:   Basic Metabolic Panel:  Recent Labs Lab 10/26/14 2235 10/28/14 0435  NA 138 140  K 3.4*  4.0  CL 105 111  CO2 25 24  GLUCOSE 104* 104*  BUN 10 7  CREATININE 0.65 0.52  CALCIUM 9.5 9.1    Liver Function Tests: No results for input(s): AST, ALT, ALKPHOS, BILITOT,  PROT, ALBUMIN in the last 168 hours. No results for input(s): LIPASE, AMYLASE in the last 168 hours. No results for input(s): AMMONIA in the last 168 hours.  CBC:  Recent Labs Lab 10/26/14 2235 10/28/14 0435 10/29/14 0951  WBC 11.4* 9.2 25.1*  HGB 7.9* 7.5* 8.3*  HCT 26.6* 25.8* 28.9*  MCV 56.7* 56.8* 57.7*  PLT 583* 501* 561*    Cardiac Enzymes:  Recent Labs Lab 10/26/14 2235 10/28/14 1038 10/28/14 1556 10/28/14 2350  TROPONINI 0.04* <0.03 <0.03 <0.03    BNP: Invalid input(s): POCBNP  CBG: No results for input(s): GLUCAP in the last 168 hours.  Microbiology: No results found for this or any previous visit.  Coagulation Studies: No results for input(s): LABPROT, INR in the last 72 hours.  Urinalysis:   Recent Labs Lab 10/26/14 2224  COLORURINE YELLOW*  LABSPEC 1.015  PHURINE 6.0  GLUCOSEU NEGATIVE  HGBUR NEGATIVE  BILIRUBINUR NEGATIVE  KETONESUR NEGATIVE  PROTEINUR NEGATIVE  NITRITE NEGATIVE  LEUKOCYTESUR NEGATIVE    Lipid Panel:  No results found for: CHOL, TRIG, HDL, CHOLHDL, VLDL, LDLCALC  HgbA1C:  Lab Results  Component Value Date   HGBA1C 6.5* 10/26/2014    Urine Drug Screen:  No results found for: LABOPIA, COCAINSCRNUR, LABBENZ, AMPHETMU, THCU, LABBARB  Alcohol Level: No results for input(s): ETH in the last 168 hours.  Other results: EKG: normal EKG, normal sinus rhythm, unchanged from previous tracings.  Imaging: Mr [name] Contrast  10/28/2014   CLINICAL DATA:  39 year old female with headaches for several months, mostly on the left side frontal region. Associated left side tingling. Hypertension. Initial encounter.  EXAM: MRI HEAD WITHOUT AND WITH CONTRAST  TECHNIQUE: Multiplanar, multiecho pulse sequences of the brain and surrounding structures were obtained without and with intravenous contrast.  CONTRAST:  44mL MULTIHANCE GADOBENATE DIMEGLUMINE 529 MG/ML IV SOLN  COMPARISON:  Head CT without contrast 10/26/2014 and earlier.   FINDINGS: Normal cerebral volume. Major intracranial vascular flow voids are within normal limits. No restricted diffusion to suggest acute infarction. No midline shift, mass effect, evidence of mass lesion, ventriculomegaly, extra-axial collection or acute intracranial hemorrhage. Cervicomedullary junction within normal limits. Partially empty sella configuration.  Pearline Cables and white matter signal is within normal limits throughout the brain. No abnormal enhancement identified. No dural thickening.  Visible internal auditory structures appear normal. Mastoids are clear. Small left maxillary sinus mucous retention cyst. Minimal left sphenoid sinus mucosal thickening. Orbits soft tissues appear normal. Negative scalp soft tissues. Negative visualized cervical spine.  IMPRESSION: 1. Partially empty sella which can be a normal anatomic variant or be associated with idiopathic intracranial hypertension (pseudotumor cerebri). 2. Otherwise normal MRI appearance of the brain.   Electronically Signed   By: Genevie Ann M.D.   On: 10/28/2014 12:21   Mr Hip Right Wo Contrast  10/28/2014   CLINICAL DATA:  Status post fall 10/25/2014. Abnormal plain film of the right hip 10/27/2014. Question avascular necrosis.  EXAM: MR OF THE RIGHT HIP WITHOUT CONTRAST  TECHNIQUE: Multiplanar, multisequence MR imaging was performed. No intravenous contrast was administered.  COMPARISON:  None.  FINDINGS: Bones: There is no avascular necrosis of the right or left femoral head. The patient has a remote healed slipped capital femoral epiphysis accounting for the appearance  on plain films. No fracture or stress change is identified. The pelvis and visualized lower lumbar spine and femurs appear normal.  Articular cartilage and labrum  Articular cartilage:  Appears preserved.  Labrum: The anterior, superior labrum appears degenerated without focal tear identified.  Joint or bursal effusion  Joint effusion:  None.  Bursae:  Unremarkable.  Muscles and  tendons  Muscles and tendons:  Intact and unremarkable in appearance.  Other findings  Miscellaneous: Imaged intrapelvic contents demonstrate fibroids in the uterus.  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 Negative for avascular necrosis.  Degenerated appearance of the anterior, superior labrum without discrete tear identified.  Uterine fibroids.   Electronically Signed   By: Inge Rise M.D.   On: 10/28/2014 15:46     Assessment/Plan:  39 y.o. female with history HTN and  headaches for the past few months. Headaches described as frontal mostly on L and radiated to L side. They can last multiple days. Associated with N/V. Pt takes daily over the counter tylenol and tylenol PM with little to no relief.     HA improved and close to baseline.   - Agree elavil 25 nightly  - Mg and Vitamin B complex - d/c planning today.  Leotis Pain

## 2014-10-29 NOTE — Plan of Care (Signed)
Problem: Discharge Progression Outcomes Goal: Other Discharge Outcomes/Goals Outcome: Progressing Plan of care progress to goal: patient admitted with cluster headache/migraines, patient medicated x one for headache, patient head CT negative, had MRI done on 10/28/2014 Patient up ambulates without difficulty, aware to call for assist when needed.  Routin v/s, daily wt. Patient refused labs this am, will pass on to day shift.

## 2014-10-30 NOTE — Discharge Summary (Signed)
[facility] NAME: Kelly Byrd    MR#:  [id number]  DATE OF BIRTH:  04/09/1975  DATE OF ADMISSION:  10/26/2014 ADMITTING PHYSICIAN: Harrie Foreman, MD  DATE OF DISCHARGE: 10/30/2014  PRIMARY CARE PHYSICIAN: No PCP Per Patient    ADMISSION DIAGNOSIS:  Cerebral infarction due to unspecified mechanism [I63.9]  DISCHARGE DIAGNOSIS:  Active Problems:   Cluster headache   Right hip pain   Anemia, iron deficiency   Menometrorrhagia   SECONDARY DIAGNOSIS:   Past Medical History  Diagnosis Date  . Asthma   . Hypertension     HOSPITAL COURSE:    #1 headache: Possibly atypical migraine with left sided paresthesias. CT and MRI of the head were negative for acute or worrisome changes. She was followed by neurology during hospitalization. She is started on Elavil 25 mg nightly and also given a prescription for Imitrex to be taken with ibuprofen when she has headache. She is also advised to start taking magnesium and vitamin B complex. At the time of discharge she states that her headache is very mild, manageable at this time.  #2 chest pain: Seems to be musculoskeletal given its sharp nature and worsening with movement of the left arm. There is also involvement of left upper back muscles. She did undergo ACS ruled out with negative cardiac enzymes, no changes on EKG, no events on telemetry. She is advised to continue with gentle stretching of the right shoulder muscles. She she has been referred to outpatient physical therapy  #3 hypertension: Continue amlodipine and increased dose of lisinopril.  #4 anemia: severely iron deficient with ferritin of 5. Reports heavy menses. She has started iron supplementation. Hemoglobin has remained stable and is 8.5 on day of discharge. [facility] From there she will need a referral to GYN to discuss treatment of  uterine fibroids.  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 She has been seen by orthopedics inpatient and will follow-up in orthopedics clinic. She may need orthotics to level out her hips. She has been referred to outpatient physical therapy.  #6 disposition: Overall this patient needs primary care with multiple referrals including to GYN, orthopedics and physical therapy. She currently does not have insurance and states that she does not have money to follow through with any of these recommendations. She was given prescriptions for all of her medications. Care management worked with the patient to discuss medication access and primary care options.  DISCHARGE CONDITIONS:   Stable  CONSULTS OBTAINED:  Treatment Team:  Leotis Pain, MD  DRUG ALLERGIES:   Allergies  Allergen Reactions  . Peanuts [Peanut Oil]     DISCHARGE MEDICATIONS:   Discharge Medication List as of 10/29/2014 10:12 AM    CONTINUE these medications which have NOT CHANGED   Details  ibuprofen (ADVIL,MOTRIN) 800 MG tablet Take 1 tablet (800 mg total) by mouth every 8 (eight) hours as needed for mild pain or moderate pain., Starting 10/02/2014, Until Discontinued, Print    albuterol (PROVENTIL HFA;VENTOLIN HFA) 108 (90 BASE) MCG/ACT inhaler Inhale 2 puffs into the lungs every 6 (six) hours as needed for wheezing or shortness of breath (wheezing)., Until Discontinued, Historical Med    albuterol (PROVENTIL) (2.5 MG/3ML) 0.083% nebulizer solution Take 3 mLs (2.5 mg total) by nebulization every 6 (six) hours as needed for wheezing or shortness of breath (wheezing)., Starting 06/27/2014, Until Discontinued,  Print    amLODipine (NORVASC) 10 MG tablet Take 1 tablet (10 mg total) by mouth daily., Starting 06/27/2014, Until Discontinued, Print    cyclobenzaprine (FLEXERIL) 10 MG tablet Take 1 tablet (10 mg total) by mouth every 8 (eight) hours as needed for muscle spasms (PRN pain. Do not drive or operate heavy  machinery while taking as can cause drowsiness.)., Starting 10/02/2014, Until Discontinued, Print    lisinopril (PRINIVIL,ZESTRIL) 10 MG tablet Take 1 tablet (10 mg total) by mouth daily., Starting 06/27/2014, Until Discontinued, Print    mometasone-formoterol (DULERA) 200-5 MCG/ACT AERO Inhale 2 puffs into the lungs 2 (two) times daily., Until Discontinued, Historical Med    montelukast (SINGULAIR) 10 MG tablet Take 1 tablet (10 mg total) by mouth at bedtime., Starting 06/27/2014, Until Discontinued, Print    azithromycin (ZITHROMAX Z-PAK) 250 MG tablet 2 tablets day 1;    1 tablet day 2 through 5, Print    HYDROcodone-acetaminophen (NORCO) 5-325 MG per tablet Take 1 tablet by mouth every 4 (four) hours as needed., Starting 06/27/2014, Until Discontinued, Print    oxyCODONE-acetaminophen (PERCOCET/ROXICET) 5-325 MG per tablet Take 1-2 tablets by mouth every 6 (six) hours as needed., Starting 09/28/2014, Until Discontinued, Print    predniSONE (DELTASONE) 50 MG tablet 1 tablet daily for 5 days, one half tablet daily for 5 days, Print         DISCHARGE INSTRUCTIONS:   [facility] They will need to refer you to a gynecologist within their network. They can also help you with medication access. Follow-up with physical therapy.   DIET:  Regular diet  DISCHARGE CONDITION:  Stable  ACTIVITY:  Activity as tolerated  OXYGEN:  Home Oxygen: No.   Oxygen Delivery: room air  DISCHARGE LOCATION:  home   If you experience worsening of your admission symptoms, develop shortness of breath, life threatening emergency, suicidal or homicidal thoughts you must seek medical attention immediately by calling 911 or calling your MD immediately  if symptoms less severe.  You Must read complete instructions/literature along with all the possible adverse reactions/side effects for all the Medicines you take and that have been prescribed to you. Take any new  Medicines after you have completely understood and accpet all the possible adverse reactions/side effects.   Please note  You were cared for by a hospitalist during your hospital stay. If you have any questions about your discharge medications or the care you received while you were in the hospital after you are discharged, you can call the unit and asked to speak with the hospitalist on call if the hospitalist that took care of you is not available. Once you are discharged, your primary care physician will handle any further medical issues. Please note that NO REFILLS for any discharge medications will be authorized once you are discharged, as it is imperative that you return to your primary care physician (or establish a relationship with a primary care physician if you do not have one) for your aftercare needs so that they can reassess your need for medications and monitor your lab values.   Today   CHIEF COMPLAINT:   Chief Complaint  Patient presents with  . Weakness    Pt presents to ER alert and in NAD. Pt reports she is having weakness and tingling on her left side. Pt states hx of HTN, some non compliance. Pt ambulatory. Pt states she fell yesterday and is having back pain. Clear speech. Moving all  extremities normally.  Marland Kitchen Headache    HISTORY OF PRESENT ILLNESS:  HPI: Patient presents to the emergency department complaining of persistent headache. She states that her head has been throbbing and pain intermittently 1 week. It hurts so frequently that she's had difficulty sleeping. She has no history of headaches or migraines. She states that her head began to hurt approximately one month ago when she developed sharp brief chest pains over her left breast that did not radiate and were not associated with nausea, vomiting or diaphoresis. The patient admits that the pain would sometimes take her breath away but that rubbing her chest would alleviate the pain sometimes. Symptom that was  consistently associated with her chest pain however was left parietal and right occipital headache. Now she complains of left sided tingling in her upper and lower extremities. She has tried to take Tylenol PM, Aleve, Percocet, and ibuprofen with little relief. Due to her intractable headache and persistent symptoms emergency department called for admission.  VITAL SIGNS:  Blood pressure 162/76, pulse 99, temperature 97.8 F (36.6 C), temperature source Oral, resp. rate 18, height 5\' 4"  (1.626 m), weight 110.133 kg (242 lb 12.8 oz), last menstrual period 10/15/2014, SpO2 100 %.  I/O:  No intake or output data in the 24 hours ending 10/30/14 1528  PHYSICAL EXAMINATION:  GENERAL:  39 y.o.-year-old patient lying in the bed with no acute distress. Obese LUNGS: Normal breath sounds bilaterally, no wheezing, rales,rhonchi or crepitation. No use of accessory muscles of respiration.  CARDIOVASCULAR: S1, S2 normal. No murmurs, rubs, or gallops.  Chest wall: There is tenderness to palpation over the left chest wall and with movement of the left arm ABDOMEN: Soft, non-tender, non-distended. Bowel sounds present. No organomegaly or mass.  EXTREMITIES: No pedal edema, cyanosis, or clubbing.  NEUROLOGIC: Cranial nerves II through XII are intact. Muscle strength 5/5 in all extremities. Sensation intact. Gait not checked.  PSYCHIATRIC: The patient is alert and oriented x 3. Depressed SKIN: No obvious rash, lesion, or ulcer.   DATA REVIEW:   CBC  Recent Labs Lab 10/29/14 0951  WBC 25.1*  HGB 8.3*  HCT 28.9*  PLT 561*    Chemistries   Recent Labs Lab 10/29/14 0951  NA 132*  K 4.7  CL 106  CO2 17*  GLUCOSE 315*  BUN 8  CREATININE 0.77  CALCIUM 9.7    Cardiac Enzymes  Recent Labs Lab 10/28/14 2350  TROPONINI <0.03    Microbiology Results  No results found for this or any previous visit.  RADIOLOGY:  Mr Hip Right Wo Contrast  10/28/2014   CLINICAL DATA:  Status post fall  10/25/2014. Abnormal plain film of the right hip 10/27/2014. Question avascular necrosis.  EXAM: MR OF THE RIGHT HIP WITHOUT CONTRAST  TECHNIQUE: Multiplanar, multisequence MR imaging was performed. No intravenous contrast was administered.  COMPARISON:  None.  FINDINGS: Bones: There is no avascular necrosis of the right or left femoral head. The patient has a remote healed slipped capital femoral epiphysis accounting for the appearance on plain films. No fracture or stress change is identified. The pelvis and visualized lower lumbar spine and femurs appear normal.  Articular cartilage and labrum  Articular cartilage:  Appears preserved.  Labrum: The anterior, superior labrum appears degenerated without focal tear identified.  Joint or bursal effusion  Joint effusion:  None.  Bursae:  Unremarkable.  Muscles and tendons  Muscles and tendons:  Intact and unremarkable in appearance.  Other findings  Miscellaneous: Imaged intrapelvic  contents demonstrate fibroids in the uterus.  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 Negative for avascular necrosis.  Degenerated appearance of the anterior, superior labrum without discrete tear identified.  Uterine fibroids.   Electronically Signed   By: Inge Rise M.D.   On: 10/28/2014 15:46    EKG:   Orders placed or performed during the hospital encounter of 10/26/14  . ED EKG  . ED EKG  . EKG 12-Lead  . EKG 12-Lead  . EKG 12-Lead  . EKG 12-Lead      Management plans discussed with the patient, family and they are in agreement.  CODE STATUS: Full  TOTAL TIME TAKING CARE OF THIS PATIENT: 35 minutes.  Greater than 50% of time spent in care coordination and counseling.  Myrtis Ser M.D on 10/30/2014 at 3:28 PM  Between 7am to 6pm - Pager - [phone]  After 6pm go to [email or web address] - password [email or web address] [email or web address]  [facility]  Office  [phone]  CC: Primary care physician; No PCP Per  Patient

## 2014-10-30 NOTE — Progress Notes (Signed)
[place], [place].   10/30/2014  Patient: Kelly Byrd   Date of Birth:  04/23/1975  Date of admission:  10/26/2014  Date of Discharge  10/30/2014    To Whom it May Concern:   Kauri Garson  may return to work on 11/04/2014.  WORK-EMPLOYMENT:  No restrictions.  If you have any questions or concerns, please don't hesitate to call.  Sincerely,   Myrtis Ser M.D Pager [phone] Office : [phone]   .

## 2014-11-07 ENCOUNTER — Ambulatory Visit: Payer: Self-pay

## 2014-11-14 ENCOUNTER — Ambulatory Visit: Payer: Self-pay

## 2014-11-27 ENCOUNTER — Institutional Professional Consult (permissible substitution): Payer: Self-pay | Admitting: Specialist

## 2014-12-25 ENCOUNTER — Ambulatory Visit: Payer: Self-pay | Admitting: Specialist

## 2015-06-14 ENCOUNTER — Inpatient Hospital Stay
Admission: EM | Admit: 2015-06-14 | Discharge: 2015-06-16 | DRG: 916 | Disposition: A | Discharge status: Home / Self Care | Payer: No Typology Code available for payment source | Attending: Internal Medicine | Admitting: Internal Medicine

## 2015-06-14 DIAGNOSIS — I10 Essential (primary) hypertension: Secondary | ICD-10-CM | POA: Diagnosis present

## 2015-06-14 DIAGNOSIS — J45909 Unspecified asthma, uncomplicated: Secondary | ICD-10-CM | POA: Diagnosis present

## 2015-06-14 DIAGNOSIS — F17200 Nicotine dependence, unspecified, uncomplicated: Secondary | ICD-10-CM | POA: Diagnosis present

## 2015-06-14 DIAGNOSIS — Z23 Encounter for immunization: Secondary | ICD-10-CM

## 2015-06-14 DIAGNOSIS — Y929 Unspecified place or not applicable: Secondary | ICD-10-CM

## 2015-06-14 DIAGNOSIS — Z79899 Other long term (current) drug therapy: Secondary | ICD-10-CM

## 2015-06-14 DIAGNOSIS — Z91010 Allergy to peanuts: Secondary | ICD-10-CM

## 2015-06-14 DIAGNOSIS — R51 Headache: Secondary | ICD-10-CM | POA: Diagnosis present

## 2015-06-14 DIAGNOSIS — D72829 Elevated white blood cell count, unspecified: Secondary | ICD-10-CM | POA: Diagnosis present

## 2015-06-14 DIAGNOSIS — T783XXA Angioneurotic edema, initial encounter: Principal | ICD-10-CM | POA: Diagnosis present

## 2015-06-14 DIAGNOSIS — Z8249 Family history of ischemic heart disease and other diseases of the circulatory system: Secondary | ICD-10-CM

## 2015-06-14 DIAGNOSIS — E871 Hypo-osmolality and hyponatremia: Secondary | ICD-10-CM | POA: Diagnosis present

## 2015-06-14 DIAGNOSIS — T464X5A Adverse effect of angiotensin-converting-enzyme inhibitors, initial encounter: Secondary | ICD-10-CM | POA: Diagnosis present

## 2015-06-14 MED ORDER — EPINEPHRINE HCL 1 MG/ML IJ SOLN
0.1500 mg | Freq: Once | INTRAMUSCULAR | Status: AC
Start: 2015-06-14 — End: 2015-06-14
  Administered 2015-06-14: 0.15 mg via INTRAMUSCULAR

## 2015-06-14 MED ORDER — DIPHENHYDRAMINE HCL 50 MG/ML IJ SOLN
25.0000 mg | Freq: Once | INTRAMUSCULAR | Status: AC
  Administered 2015-06-14: 25 mg via INTRAVENOUS

## 2015-06-14 MED ORDER — FAMOTIDINE IN NACL 20-0.9 MG/50ML-% IV SOLN
20.0000 mg | Freq: Once | INTRAVENOUS | Status: AC
  Administered 2015-06-14: 20 mg via INTRAVENOUS
  Filled 2015-06-14: qty 50

## 2015-06-14 MED ORDER — METHYLPREDNISOLONE SODIUM SUCC 125 MG IJ SOLR
125.0000 mg | INTRAMUSCULAR | Status: AC
  Administered 2015-06-14: 125 mg via INTRAVENOUS

## 2015-06-14 NOTE — ED Notes (Signed)
Pt reports she feels like her face is still swelling. MD made aware and reassessed pt

## 2015-06-14 NOTE — ED Notes (Signed)
Pt presents with angioedema, takes lisinopril

## 2015-06-14 NOTE — Consult Note (Signed)
Kelly Byrd, Mess [id number] [birth year] Kelly Nearing, MD  Reason for Consult: Evaluate airway Requesting Physician: [name], MD Consulting Physician: Kelly Byrd  HPI: This 41 y.o. year old female was admitted on 06/14/2015 for allergic reaction. She came to the emergency room with swelling of the lips and face and a feeling of tightness in her throat. She's had milder degrees of these reactions before, but has never sought treatment for them, just taking Benadryl at home. She does have a history of peanut allergy. With the peanut allergy she has had itching and some puffiness around the eyes, but denies any exposure to peanuts today. She says she had [place] fried chicken and pizza this evening prior to the reaction occurring. She is notably on lisinopril. She does note some mild itching around her chest.  Allergies:  Allergies  Allergen Reactions  . Peanuts [Peanut Oil]     Medications:  (Not in a hospital admission). Current Facility-Administered Medications  Medication Dose Route Frequency Provider Last Rate Last Dose  . famotidine (PEPCID) IVPB 20 mg premix  20 mg Intravenous Once [name], MD 100 mL/hr at 06/14/15 2252 20 mg at 06/14/15 2252   Current Outpatient Prescriptions  Medication Sig Dispense Refill  . albuterol (PROVENTIL HFA;VENTOLIN HFA) 108 (90 BASE) MCG/ACT inhaler Inhale 2 puffs into the lungs every 6 (six) hours as needed for wheezing or shortness of breath (wheezing). 1 Inhaler 6  . amitriptyline (ELAVIL) 25 MG tablet Take 1 tablet (25 mg total) by mouth at bedtime. 30 tablet 1  . amLODipine (NORVASC) 10 MG tablet Take 1 tablet (10 mg total) by mouth daily. 30 tablet 1  . cyclobenzaprine (FLEXERIL) 10 MG tablet Take 1 tablet (10 mg total) by mouth every 8 (eight) hours as needed for muscle spasms (PRN pain. Do not drive or operate heavy machinery while taking as can cause drowsiness.). 12 tablet 0  . ferrous sulfate 325 (65 FE) MG tablet Take 1 tablet  (325 mg total) by mouth 3 (three) times daily with meals. 90 tablet 3  . HYDROcodone-acetaminophen (NORCO) 5-325 MG per tablet Take 1 tablet by mouth every 6 (six) hours as needed for moderate pain. 30 tablet 0  . ibuprofen (ADVIL,MOTRIN) 800 MG tablet Take 1 tablet (800 mg total) by mouth every 8 (eight) hours as needed for mild pain or moderate pain. 15 tablet 0  . lisinopril (PRINIVIL,ZESTRIL) 20 MG tablet Take 1 tablet (20 mg total) by mouth daily. 30 tablet 6  . mometasone-formoterol (DULERA) 200-5 MCG/ACT AERO Inhale 2 puffs into the lungs 2 (two) times daily. 1 Inhaler 6  . montelukast (SINGULAIR) 10 MG tablet Take 1 tablet (10 mg total) by mouth at bedtime. 30 tablet 1  . mupirocin ointment (BACTROBAN) 2 % Apply topically 2 (two) times daily. To bump on right thigh 15 g 0  . SUMAtriptan (IMITREX) 50 MG tablet Take 1 tablet (50 mg total) by mouth every 2 (two) hours as needed for migraine or headache. May repeat in 2 hours if headache persists or recurs. 10 tablet 0    PMH:  Past Medical History  Diagnosis Date  . Asthma   . Hypertension     Fam Hx:  Family History  Problem Relation Age of Onset  . Hypertension Mother   . Hypertension Father   . Diabetes Mellitus II Maternal Grandmother   . Lupus      Soc Hx:  Social History   Social History  . Marital Status: Single  Spouse Name: N/A  . Number of Children: N/A  . Years of Education: N/A   Occupational History  . Not on file.   Social History Main Topics  . Smoking status: Current Every Day Smoker    Types: Cigarettes  . Smokeless tobacco: Not on file  . Alcohol Use: No  . Drug Use: No  . Sexual Activity: Yes   Other Topics Concern  . Not on file   Social History Narrative    PSH:  Past Surgical History  Procedure Laterality Date  . Cesarean section      2  . Procedures since admission: No admission procedures for hospital encounter.  ROS: Review of systems normal other than 12 systems except per  HPI.  PHYSICAL EXAM Vitals:  Filed Vitals:   06/14/15 2253 06/14/15 2254  BP:  151/100  Pulse: 89   Temp:  98.5 F (36.9 C)  Resp: 19   . General: Well-developed, Well-nourished in mild distress but no stridor Mood: Mood and affect well adjusted, pleasant and cooperative. Orientation: Grossly alert and oriented. Vocal Quality: No hoarseness. Communicates verbally. head and Face: NCAT. She has puffiness around the midface and eyes. No significant facial scars. No tenderness with sinus percussion. Facial strength normal and symmetric. Ears: External ears with normal landmarks, no lesions. External auditory canals free of infection, cerumen impaction or lesions. Tympanic membranes intact with good landmarks and normal mobility on pneumatic otoscopy. No middle ear effusion. Hearing: Speech reception grossly normal. Nose: External nose normal with midline dorsum and no lesions or deformity. Nasal Cavity reveals essentially midline septum with normal inferior turbinates. No significant mucosal congestion or erythema. Nasal secretions are minimal and clear. No polyps seen on anterior rhinoscopy. Oral Cavity/ Oropharynx: Lips are edematous, particularly the lower lip. Teeth no frank dental caries. Gingiva healthy with no lesions or gingivitis. Oropharynx including tongue, buccal mucosa, floor of mouth, hard and soft palate, uvula and posterior pharynx free of exudates, erythema or lesions with normal symmetry and hydration. The tongue itself does not look significantly edematous and there is no visible edema of the palate or uvula, though exam is a little limited due to her gag reflex. She has a [place] 4 airway, so the uvula cannot be easily visualized Indirect Laryngoscopy/Nasopharyngoscopy: Visualization of the larynx, hypopharynx and nasopharynx is not possible in this setting with routine examination. Neck: Supple and symmetric with no palpable masses, tenderness or crepitance. The trachea is  midline. Thyroid gland is soft, nontender and symmetric with no masses or enlargement. Parotid and submandibular glands are soft, nontender and symmetric, without masses. Lymphatic: Cervical lymph nodes are without palpable lymphadenopathy or tenderness. Respiratory: Normal respiratory effort without labored breathing. Cardiovascular: Carotid pulse shows regular rate and rhythm Neurologic: Cranial Nerves II through XII are grossly intact. Eyes: Gaze and Ocular Motility are grossly normal. PERRLA. No visible nystagmus.  MEDICAL DECISION MAKING: Data Review: No results found for this or any previous visit (from the past 48 hour(s)).Marland Kitchen No results found.Marland Kitchen   PROCEDURE: Procedure: Diagnostic Fiberoptic Nasolaryngoscopy Diagnosis: Angioedema, possible throat swelling Indications: Evaluate the airway Findings: The nasopharynx was found to be clear. Hypopharynx larynx and tongue base reveals no edema except for some mild interarytenoid edema most consistent with ring of pharyngeal reflux related irritation. There is no edema of the vocal cords or epiglottis or tongue base. Description of Procedure: After discussing procedure and risks  (primarily nose bleed) with the patient, a flexible fiberoptic scope was passed through the right nasal cavity. The  nasal cavity was inspected and the scope passed through the Nasopharynx to the region of the hypopharynx and larynx. The patient was instructed to phonate to assess vocal cord mobility. The tongue was extended to evaluate the tongue base completely. Valsalva was performed to insufflate the hypopharynx for improved examination. Findings are as noted above. The scope was withdrawn. The patient tolerated the procedure well.  ASSESSMENT: Patient with angioedema versus allergic reaction. At this time her airway appears to have no significant compromise as there is no significant swelling of the tongue, and no edema of the larynx on exam.  PLAN: Obviously I cannot  predict progression so the patient should be monitored closely, preferably in an ICU setting until the visible edema is resolving. Given the concern that this is angioedema related to her lisinopril, it would be best that she be taken off of this medication and an alternative blood pressure medicine substituted. She does have a history of food allergy. Itching is less common in angioedema situations, so a food allergy panel could be sent to screen for any food allergies although this will take several days to get back. If she does not have one, she should be provided with an EpiPen prior to discharge.   Kelly Nearing, MD 06/14/2015 11:15 PM

## 2015-06-14 NOTE — ED Notes (Signed)
Pt asleep in room, vital signs stable, in NAD. ENT MD at bedside

## 2015-06-14 NOTE — ED Provider Notes (Addendum)
-----------------------------------------   11:53 PM on 06/14/2015 -----------------------------------------  Called to bedside, patient with angioedema mostly the face and lip, she states her throat feels "slightly tight" however, she states is been no progression and throat tightness since her arrival. She has already been scoped by ENT and they're following her. She has been admitted by prior ED physician. Patient has on exam no evidence of stridor or difficulty breathing, she does have clear facial swelling and lip swelling, no evidence of tongue swelling or uvular swelling, and her voice is normal.  D/w dr. Richardson Landry, who states at the time he scoped the patient approximately 20 minutes ago there was no angioedema of the lwer airway or laryngeal edema. At this time that does not seem to be any change in that. We will continue to watch the patient closely. He does not recommend at this time intubation.   Schuyler Amor, MD 06/14/15 2354  Schuyler Amor, MD 06/15/15 Dyann Kief

## 2015-06-14 NOTE — ED Provider Notes (Signed)
[facility] Emergency Department Provider Note  ____________________________________________  Time seen: Approximately 10:44 PM  I have reviewed the triage vital signs and the nursing notes.   HISTORY  Chief Complaint Angioedema    HPI Kelly Byrd is a 40 y.o. female reports she had sudden swelling of her lower lip some itching and a little swelling of her lower neck starting about 2 hours ago.  Denies pain. Reports swelling is been ongoing for about 2 hours and is associated with slight itching in the arms and across her face. She reports that she's had swelling like this in her lower mouth once before and it went away so she didn't think much of it.  She feels a slight abnormal sensation with swallowing but denies any trouble breathing. She does have a history of asthma denies any wheezing. She is also allergic to peanuts but no known exposure.  Denies pregnancy. Notably the patient is on an ACE inhibitor.  No trouble speaking. Denies hoarse voice.  Past Medical History  Diagnosis Date  . Asthma   . Hypertension     Patient Active Problem List   Diagnosis Date Noted  . Right hip pain 10/29/2014  . Anemia, iron deficiency 10/29/2014  . Menometrorrhagia 10/29/2014  . Cluster headache 10/27/2014    Past Surgical History  Procedure Laterality Date  . Cesarean section      2    Current Outpatient Rx  Name  Route  Sig  Dispense  Refill  . albuterol (PROVENTIL HFA;VENTOLIN HFA) 108 (90 BASE) MCG/ACT inhaler   Inhalation   Inhale 2 puffs into the lungs every 6 (six) hours as needed for wheezing or shortness of breath (wheezing).   1 Inhaler   6   . amitriptyline (ELAVIL) 25 MG tablet   Oral   Take 1 tablet (25 mg total) by mouth at bedtime.   30 tablet   1   . amLODipine (NORVASC) 10 MG tablet   Oral   Take 1 tablet (10 mg total) by mouth daily.   30 tablet   1   . cyclobenzaprine (FLEXERIL) 10 MG tablet   Oral   Take 1  tablet (10 mg total) by mouth every 8 (eight) hours as needed for muscle spasms (PRN pain. Do not drive or operate heavy machinery while taking as can cause drowsiness.).   12 tablet   0   . ferrous sulfate 325 (65 FE) MG tablet   Oral   Take 1 tablet (325 mg total) by mouth 3 (three) times daily with meals.   90 tablet   3   . HYDROcodone-acetaminophen (NORCO) 5-325 MG per tablet   Oral   Take 1 tablet by mouth every 6 (six) hours as needed for moderate pain.   30 tablet   0   . ibuprofen (ADVIL,MOTRIN) 800 MG tablet   Oral   Take 1 tablet (800 mg total) by mouth every 8 (eight) hours as needed for mild pain or moderate pain.   15 tablet   0   . lisinopril (PRINIVIL,ZESTRIL) 20 MG tablet   Oral   Take 1 tablet (20 mg total) by mouth daily.   30 tablet   6   . mometasone-formoterol (DULERA) 200-5 MCG/ACT AERO   Inhalation   Inhale 2 puffs into the lungs 2 (two) times daily.   1 Inhaler   6   . montelukast (SINGULAIR) 10 MG tablet   Oral   Take 1 tablet (10 mg total)  by mouth at bedtime.   30 tablet   1   . mupirocin ointment (BACTROBAN) 2 %   Topical   Apply topically 2 (two) times daily. To bump on right thigh   15 g   0   . SUMAtriptan (IMITREX) 50 MG tablet   Oral   Take 1 tablet (50 mg total) by mouth every 2 (two) hours as needed for migraine or headache. May repeat in 2 hours if headache persists or recurs.   10 tablet   0     Take with ibuprofen. Do not take more than 2 table ...     Allergies Peanuts  Family History  Problem Relation Age of Onset  . Hypertension Mother   . Hypertension Father   . Diabetes Mellitus II Maternal Grandmother   . Lupus      Social History Social History  Substance Use Topics  . Smoking status: Current Every Day Smoker    Types: Cigarettes  . Smokeless tobacco: None  . Alcohol Use: No    Review of Systems Constitutional: No fever/chills Eyes: No visual changes. ENT: See history of present  illness Cardiovascular: Denies chest pain. Respiratory: Denies shortness of breath. Gastrointestinal: No abdominal pain.  No nausea, no vomiting.  No diarrhea.  No constipation. Genitourinary: Negative for dysuria. Musculoskeletal: Negative for back pain. Skin: Negative for rash. Neurological: Negative for headaches, focal weakness or numbness.  10-point ROS otherwise negative.  ____________________________________________   PHYSICAL EXAM:  VITAL SIGNS: ED Triage Vitals  Enc Vitals Group     BP --      Pulse --      Resp --      Temp --      Temp src --      SpO2 --      Weight --      Height --      Head Cir --      Peak Flow --      Pain Score 06/14/15 2241 10     Pain Loc --      Pain Edu? --      Excl. in [place]? --    Constitutional: Alert and oriented. Well appearing and in no acute distress. Eyes: Conjunctivae are normal. PERRL. EOMI. Head: Atraumatic. Nose: No congestion/rhinnorhea. Mouth/Throat: Mucous membranes are moist.  Oropharynx non-erythematous. There is no noted oral pharyngeal edema in the posterior oropharynx is seen clearly. The patient does have notable moderate angioedema of the lower lip area no angioedema of the upper lip. There is mild angioedema across the lower mandible. Neck: No stridor.   Cardiovascular: Normal rate, regular rhythm. Grossly normal heart sounds.  Good peripheral circulation. Respiratory: Normal respiratory effort.  No retractions. Lungs CTAB. Gastrointestinal: Soft and nontender. No distention.  Musculoskeletal: No lower extremity tenderness nor edema.  Neurologic:  Normal speech and language. No gross focal neurologic deficits are appreciated.  Skin:  Skin is warm, dry and intact. No rash noted. Psychiatric: Mood and affect are normal. Speech and behavior are normal.  ____________________________________________   LABS (all labs ordered are listed, but only abnormal results are displayed)  Labs Reviewed - No data to  display ____________________________________________  EKG   ____________________________________________  RADIOLOGY   ____________________________________________   PROCEDURES  Procedure(s) performed: None  Critical Care performed: Yes, see critical care note(s)  CRITICAL CARE Performed by: [name]   Total critical care time: 35 minutes  Critical care time was exclusive of separately billable procedures and treating other  patients.  Critical care was necessary to treat or prevent imminent or life-threatening deterioration.  Critical care was time spent personally by me on the following activities: development of treatment plan with patient and/or surrogate as well as nursing, discussions with consultants, evaluation of patient's response to treatment, examination of patient, obtaining history from patient or surrogate, ordering and performing treatments and interventions, ordering and review of laboratory studies, ordering and review of radiographic studies, pulse oximetry and re-evaluation of patient's condition.  ____________________________________________   INITIAL IMPRESSION / ASSESSMENT AND PLAN / ED COURSE  Pertinent labs & imaging results that were available during my care of the patient were reviewed by me and considered in my medical decision making (see chart for details).  Patient gives of angioedema of the face. Notably the lower lips and some across the mandible. She also reports a slight feeling of abnormality was trying to swallow. She has no obvious immediate airway issue, but certainly is at risk for airway compromise. The case was discussed with Dr. Malon Kindle of ear nose and throat at 10:45 PM, and consult emergently requested for which she is coming to see.  There are no suspect ACE inhibitor induced, I will treat her with standard anaphylaxis treatments given she does have some itching in the arms and feels itching across the face.  Denies  infectious symptoms. No symptoms of abscess, Ludwig's, croup, epiglottitis, or other immediate etiology noted.   ----------------------------------------- 11:20 PM on 06/14/2015 -----------------------------------------  Patient seen by Dr. Richardson Landry of ear nose and throat. He advises that the airway appears to be widely patent at this time via pharyngoscopy. He recommends admission for angioedema observation and discontinuation of ACE inhibitor. I think is very agreeable to this time. Discussed with the hospitalist, admitting. Dr. Richardson Landry placing full recommendation soon in the chart. ____________________________________________   FINAL CLINICAL IMPRESSION(S) / ED DIAGNOSES  Final diagnoses:  Angioedema, initial encounter      [name], MD 06/14/15 2322

## 2015-06-15 ENCOUNTER — Other Ambulatory Visit: Payer: Self-pay

## 2015-06-15 ENCOUNTER — Encounter: Payer: Self-pay | Admitting: Internal Medicine

## 2015-06-15 DIAGNOSIS — T783XXA Angioneurotic edema, initial encounter: Secondary | ICD-10-CM | POA: Diagnosis present

## 2015-06-15 DIAGNOSIS — R079 Chest pain, unspecified: Secondary | ICD-10-CM

## 2015-06-15 LAB — TROPONIN I: Troponin I: <0.03 ng/mL (ref <0.031)

## 2015-06-15 LAB — BASIC METABOLIC PANEL
ANION GAP: 7 (ref 5–15)
BUN: 11 mg/dL (ref 6–20)
CALCIUM: 9.1 mg/dL (ref 8.9–10.3)
CHLORIDE: 108 mmol/L (ref 101–111)
CO2: 21 mmol/L — AB (ref 22–32)
Creatinine, Ser: 0.78 mg/dL (ref 0.44–1.00)
GFR calc non Af Amer: >60 mL/min (ref >60)
Glucose, Bld: 160 mg/dL — ABNORMAL HIGH (ref 65–99)
Potassium: 3.7 mmol/L (ref 3.5–5.1)
Sodium: 136 mmol/L (ref 135–145)

## 2015-06-15 LAB — CBC
HEMATOCRIT: 30.2 % — AB (ref 35.0–47.0)
HEMOGLOBIN: 8.8 g/dL — AB (ref 12.0–16.0)
MCH: 17.2 pg — ABNORMAL LOW (ref 26.0–34.0)
MCHC: 29 g/dL — ABNORMAL LOW (ref 32.0–36.0)
MCV: 59.3 fL — ABNORMAL LOW (ref 80.0–100.0)
Platelets: 475 10*3/uL — ABNORMAL HIGH (ref 150–440)
RBC: 5.09 MIL/uL (ref 3.80–5.20)
RDW: 20.5 % — ABNORMAL HIGH (ref 11.5–14.5)
WBC: 15.4 10*3/uL — AB (ref 3.6–11.0)

## 2015-06-15 LAB — MRSA PCR SCREENING: MRSA by PCR: POSITIVE — AB

## 2015-06-15 LAB — INFLUENZA PANEL BY PCR (TYPE A & B)
H1N1 flu by pcr: NOT DETECTED
INFLAPCR: NEGATIVE
Influenza B By PCR: NEGATIVE

## 2015-06-15 MED ORDER — MUPIROCIN 2 % EX OINT
1.0000 "application " | TOPICAL_OINTMENT | Freq: Two times a day (BID) | CUTANEOUS | Status: DC
  Administered 2015-06-15 – 2015-06-16 (×3): 1 via NASAL
  Filled 2015-06-15: qty 22

## 2015-06-15 MED ORDER — ACETAMINOPHEN 325 MG PO TABS
650.0000 mg | ORAL_TABLET | Freq: Four times a day (QID) | ORAL | Status: DC | PRN
  Administered 2015-06-15 (×2): 650 mg via ORAL
  Filled 2015-06-15 (×3): qty 2

## 2015-06-15 MED ORDER — CYCLOBENZAPRINE HCL 10 MG PO TABS: 10.0000 mg | ORAL_TABLET | Freq: Three times a day (TID) | ORAL | Status: DC | PRN

## 2015-06-15 MED ORDER — AMLODIPINE BESYLATE 10 MG PO TABS
10.0000 mg | ORAL_TABLET | Freq: Every day | ORAL | Status: DC
  Administered 2015-06-15 – 2015-06-16 (×2): 10 mg via ORAL
  Filled 2015-06-15 (×2): qty 1

## 2015-06-15 MED ORDER — IBUPROFEN 400 MG PO TABS
800.0000 mg | ORAL_TABLET | Freq: Once | ORAL | Status: AC
  Administered 2015-06-15: 800 mg via ORAL
  Filled 2015-06-15: qty 2

## 2015-06-15 MED ORDER — TRAMADOL HCL 50 MG PO TABS
50.0000 mg | ORAL_TABLET | Freq: Four times a day (QID) | ORAL | Status: DC | PRN
  Administered 2015-06-15 – 2015-06-16 (×3): 50 mg via ORAL
  Filled 2015-06-15 (×4): qty 1

## 2015-06-15 MED ORDER — AMITRIPTYLINE HCL 25 MG PO TABS
25.0000 mg | ORAL_TABLET | Freq: Every day | ORAL | Status: DC
  Administered 2015-06-15: 25 mg via ORAL
  Filled 2015-06-15 (×2): qty 1

## 2015-06-15 MED ORDER — SODIUM CHLORIDE 0.9% FLUSH
3.0000 mL | Freq: Two times a day (BID) | INTRAVENOUS | Status: DC
  Administered 2015-06-15 – 2015-06-16 (×4): 3 mL via INTRAVENOUS

## 2015-06-15 MED ORDER — MOMETASONE FURO-FORMOTEROL FUM 200-5 MCG/ACT IN AERO
2.0000 | INHALATION_SPRAY | Freq: Two times a day (BID) | RESPIRATORY_TRACT | Status: DC
  Administered 2015-06-15 – 2015-06-16 (×3): 2 via RESPIRATORY_TRACT
  Filled 2015-06-15: qty 8.8

## 2015-06-15 MED ORDER — METHYLPREDNISOLONE SODIUM SUCC 125 MG IJ SOLR: 60.0000 mg | Freq: Every day | INTRAMUSCULAR | Status: DC

## 2015-06-15 MED ORDER — ASPIRIN EC 325 MG PO TBEC
DELAYED_RELEASE_TABLET | ORAL | Status: AC
  Administered 2015-06-15: 325 mg
  Filled 2015-06-15: qty 1

## 2015-06-15 MED ORDER — MONTELUKAST SODIUM 10 MG PO TABS
10.0000 mg | ORAL_TABLET | Freq: Every day | ORAL | Status: DC
  Administered 2015-06-15: 10 mg via ORAL
  Filled 2015-06-15: qty 1

## 2015-06-15 MED ORDER — MORPHINE SULFATE (PF) 2 MG/ML IV SOLN
1.0000 mg | Freq: Once | INTRAVENOUS | Status: AC
  Administered 2015-06-15: 1 mg via INTRAVENOUS

## 2015-06-15 MED ORDER — ONDANSETRON HCL 4 MG/2ML IJ SOLN: 4.0000 mg | Freq: Four times a day (QID) | INTRAMUSCULAR | Status: DC | PRN

## 2015-06-15 MED ORDER — PREDNISONE 20 MG PO TABS
60.0000 mg | ORAL_TABLET | Freq: Every day | ORAL | Status: DC
  Administered 2015-06-15 – 2015-06-16 (×2): 60 mg via ORAL
  Filled 2015-06-15 (×2): qty 3

## 2015-06-15 MED ORDER — SODIUM CHLORIDE 0.9 % IV SOLN
INTRAVENOUS | Status: DC
  Administered 2015-06-15: 02:00:00 via INTRAVENOUS

## 2015-06-15 MED ORDER — FAMOTIDINE 20 MG PO TABS
20.0000 mg | ORAL_TABLET | Freq: Two times a day (BID) | ORAL | Status: DC
  Administered 2015-06-15 – 2015-06-16 (×3): 20 mg via ORAL
  Filled 2015-06-15 (×3): qty 1

## 2015-06-15 MED ORDER — DIPHENHYDRAMINE HCL 25 MG PO CAPS
25.0000 mg | ORAL_CAPSULE | Freq: Three times a day (TID) | ORAL | Status: DC
  Administered 2015-06-15 – 2015-06-16 (×4): 25 mg via ORAL
  Filled 2015-06-15 (×4): qty 1

## 2015-06-15 MED ORDER — CHLORHEXIDINE GLUCONATE CLOTH 2 % EX PADS
6.0000 | MEDICATED_PAD | Freq: Every day | CUTANEOUS | Status: DC
  Administered 2015-06-16: 6 via TOPICAL

## 2015-06-15 MED ORDER — ALBUTEROL SULFATE (2.5 MG/3ML) 0.083% IN NEBU: 2.5000 mg | INHALATION_SOLUTION | Freq: Four times a day (QID) | RESPIRATORY_TRACT | Status: DC | PRN

## 2015-06-15 MED ORDER — MORPHINE SULFATE (PF) 2 MG/ML IV SOLN
INTRAVENOUS | Status: AC
  Administered 2015-06-15: 1 mg via INTRAVENOUS
  Filled 2015-06-15: qty 1

## 2015-06-15 MED ORDER — ENOXAPARIN SODIUM 40 MG/0.4ML ~~LOC~~ SOLN
40.0000 mg | Freq: Two times a day (BID) | SUBCUTANEOUS | Status: DC
  Administered 2015-06-15 – 2015-06-16 (×3): 40 mg via SUBCUTANEOUS
  Filled 2015-06-15 (×3): qty 0.4

## 2015-06-15 MED ORDER — ONDANSETRON HCL 4 MG PO TABS: 4.0000 mg | ORAL_TABLET | Freq: Four times a day (QID) | ORAL | Status: DC | PRN

## 2015-06-15 MED ORDER — FERROUS SULFATE 325 (65 FE) MG PO TABS
325.0000 mg | ORAL_TABLET | Freq: Three times a day (TID) | ORAL | Status: DC
  Administered 2015-06-15 – 2015-06-16 (×5): 325 mg via ORAL
  Filled 2015-06-15 (×5): qty 1

## 2015-06-15 MED ORDER — HYDRALAZINE HCL 20 MG/ML IJ SOLN
10.0000 mg | INTRAMUSCULAR | Status: DC | PRN
  Administered 2015-06-15: 10 mg via INTRAVENOUS
  Filled 2015-06-15: qty 1

## 2015-06-15 MED ORDER — METHYLPREDNISOLONE SODIUM SUCC 125 MG IJ SOLR
125.0000 mg | Freq: Four times a day (QID) | INTRAMUSCULAR | Status: DC
Start: 2015-06-15 — End: 2015-06-15
  Administered 2015-06-15 (×2): 125 mg via INTRAVENOUS
  Filled 2015-06-15 (×2): qty 2

## 2015-06-15 MED ORDER — DIPHENHYDRAMINE HCL 25 MG PO CAPS: 25.0000 mg | ORAL_CAPSULE | Freq: Four times a day (QID) | ORAL | Status: DC | PRN

## 2015-06-15 MED ORDER — ASPIRIN 325 MG PO TABS
325.0000 mg | ORAL_TABLET | Freq: Every day | ORAL | Status: DC
  Administered 2015-06-15 – 2015-06-16 (×2): 325 mg via ORAL
  Filled 2015-06-15: qty 1

## 2015-06-15 NOTE — Progress Notes (Signed)
Dr. Doy Hutching notified of pt complaint of uncontrolled headache. Tramadol 50mg  q6 prn ordered.

## 2015-06-15 NOTE — Progress Notes (Signed)
Report given to [facility] RN on 1A. All questions answered.

## 2015-06-15 NOTE — Progress Notes (Signed)
Patient has remained alert and oriented throughout the shift, she has been resting well.  Some edema remains in the bottom lip and around her eyes and cheeks but no further edema is noted.  Vital signs are stable and she states her breathing is "easier than when she came in".  Will continue to monitor.

## 2015-06-15 NOTE — Progress Notes (Addendum)
Patient ID: Kelly Byrd, female   DOB: 08/04/1975, 40 y.o.   MRN: [id number] [facility] [place] NAME: Kelly Byrd    MR#:  [id number]  DATE OF BIRTH:  30-Jan-1976  SUBJECTIVE:  Headache. No sob. Able to swallow water  REVIEW OF SYSTEMS:   Review of Systems  Constitutional: Negative for fever, chills and weight loss.  HENT: Negative for ear discharge, ear pain and nosebleeds.   Eyes: Negative for blurred vision, pain and discharge.  Respiratory: Negative for sputum production, shortness of breath, wheezing and stridor.   Cardiovascular: Negative for chest pain, palpitations, orthopnea and PND.  Gastrointestinal: Negative for nausea, vomiting, abdominal pain and diarrhea.  Genitourinary: Negative for urgency and frequency.  Musculoskeletal: Positive for back pain and joint pain.  Neurological: Positive for weakness and headaches. Negative for sensory change, speech change and focal weakness.  Psychiatric/Behavioral: Negative for depression and hallucinations. The patient is not nervous/anxious.   All other systems reviewed and are negative.  Tolerating Diet:some Tolerating PT: not needed  DRUG ALLERGIES:   Allergies  Allergen Reactions  . Ace Inhibitors Swelling  . Peanuts [Peanut Oil]     VITALS:  Blood pressure 137/83, pulse 72, temperature 98.6 F (37 C), temperature source Oral, resp. rate 16, height 5\' 4"  (1.626 m), weight 107.1 kg (236 lb 1.8 oz), SpO2 99 %.  PHYSICAL EXAMINATION:   Physical Exam  GENERAL:  40 y.o.-year-old patient lying in the bed with no acute distress.  EYES: Pupils equal, round, reactive to light and accommodation. No scleral icterus. Extraocular muscles intact.  HEENT: Head atraumatic, normocephalic. Oropharynx and nasopharynx clear.  NECK:  Supple, no jugular venous distention. No thyroid enlargement, no tenderness.  LUNGS: Normal breath sounds bilaterally, no wheezing, rales,  rhonchi. No use of accessory muscles of respiration.  CARDIOVASCULAR: S1, S2 normal. No murmurs, rubs, or gallops.  ABDOMEN: Soft, nontender, nondistended. Bowel sounds present. No organomegaly or mass.  EXTREMITIES: No cyanosis, clubbing or edema b/l.    NEUROLOGIC: Cranial nerves II through XII are intact. No focal Motor or sensory deficits b/l.   PSYCHIATRIC:  patient is alert and oriented x 3.  SKIN: No obvious rash, lesion, or ulcer.   LABORATORY PANEL:  CBC  Recent Labs Lab 06/15/15 0406  WBC 15.4*  HGB 8.8*  HCT 30.2*  PLT 475*    Chemistries   Recent Labs Lab 06/15/15 0406  NA 136  K 3.7  CL 108  CO2 21*  GLUCOSE 160*  BUN 11  CREATININE 0.78  CALCIUM 9.1    ASSESSMENT AND PLAN:  Kelly Byrd is a 40 y.o. female with a known history of hypertension, bronchial asthma presented to the emergency room with swelling of the lower lip, face and neck. Swelling of the lower lip started around evening yesterday. It progressively increased in size and was associated later on with swelling of the eyelids, face and the neck area  1. Angioedema probably secondary to ACE Inhibitor. Lip swelling improved remarkably. Patient is able to swallow liquids. We'll start soft diet. Her oxygen sats are more than 92% on room air. She is hemodynamically stable Change to oral prednisone, famotidine, but Benadryl Seen by ENT. The endoscopy did not show swelling over the vocal cords area was patent and open. No signs of respiratory distress overnight -ACE inhibitor was placed and allergy list. Patient advised not to take any form of Ace inhibitors, she voiced understanding. - start her on by  mouth diet   2. Leukocytosis Appears reactive   3. Hypertension Continue amlodipine  Blood pressure is stable   4. Bronchial asthma stable  5. Headache will give her ibuprofen once  Case discussed with Care Management/Social Worker. Management plans discussed with the patient, family and  they are in agreement.  CODE STATUS:Full  DVT Prophylaxis: Lovenox  TOTAL TIME TAKING CARE OF THIS PATIENT:30s.  >50% time spent on counselling and coordination of care  POSSIBLE D/C TODAY  DEPENDING ON CLINICAL CONDITION.  Note: This dictation was prepared with Dragon dictation along with smaller phrase technology. Any transcriptional errors that result from this process are unintentional.  Nitasha Jewel M.D on 06/15/2015 at 8:10 AM  Between 7am to 6pm - Pager - [phone]  After 6pm go to [email or web address] - password [email or web address]  [facility]  Office  [phone]  CC: Primary care physician; No PCP Per Patient

## 2015-06-15 NOTE — H&P (Addendum)
[facility] [place] NAME: Kelly Byrd    MR#:  [id number]  DATE OF BIRTH:  1975-05-01  DATE OF ADMISSION:  06/14/2015  PRIMARY CARE PHYSICIAN: No PCP Per Patient   REQUESTING/REFERRING PHYSICIAN:   CHIEF COMPLAINT:   Chief Complaint  Patient presents with  . Angioedema    HISTORY OF PRESENT ILLNESS: Kelly Byrd  is a 40 y.o. female with a known history of hypertension, bronchial asthma presented to the emergency room with swelling of the lower lip, face and neck. Swelling of the lower lip started around evening yesterday. It progressively increased in size and was associated later on with swelling of the eyelids, face and the neck area. No no difficulty in breathing or swallowing food, patient takes lisinopril for hypertension but never had this reaction in the past. Patient was seen by ENT in the emergency room and had a laryngoscopy done no edema of the uvula or vocal cords noted. Airway is patent, recommendations include the admission of the patient in ICU for close monitoring. No complaints of any chest discomfort. No history of any fever or chills or cough. No history of any bee sting or any travel.  PAST MEDICAL HISTORY:   Past Medical History  Diagnosis Date  . Asthma   . Hypertension     PAST SURGICAL HISTORY:  Past Surgical History  Procedure Laterality Date  . Cesarean section      2    SOCIAL HISTORY:  Social History  Substance Use Topics  . Smoking status: Current Every Day Smoker    Types: Cigarettes  . Smokeless tobacco: Not on file  . Alcohol Use: No    FAMILY HISTORY:  Family History  Problem Relation Age of Onset  . Hypertension Mother   . Hypertension Father   . Diabetes Mellitus II Maternal Grandmother   . Lupus      DRUG ALLERGIES:  Allergies  Allergen Reactions  . Peanuts [Peanut Oil]     REVIEW OF SYSTEMS:   CONSTITUTIONAL: No fever, fatigue or weakness.  EYES: No  blurred or double vision. Swelling around eye lids EARS, NOSE, AND THROAT: No tinnitus or ear pain. Swelling of neck noted, swelling of lower lip noted. RESPIRATORY: No cough, shortness of breath, wheezing or hemoptysis.  CARDIOVASCULAR: No chest pain, orthopnea, edema.  GASTROINTESTINAL: No nausea, vomiting, diarrhea or abdominal pain.  GENITOURINARY: No dysuria, hematuria.  ENDOCRINE: No polyuria, nocturia,  HEMATOLOGY: No anemia, easy bruising or bleeding SKIN: No rash or lesion. MUSCULOSKELETAL: No joint pain or arthritis.   NEUROLOGIC: No tingling, numbness, weakness.  PSYCHIATRY: No anxiety or depression.   MEDICATIONS AT HOME:  Prior to Admission medications   Medication Sig Start Date End Date Taking? Authorizing Provider  albuterol (PROVENTIL HFA;VENTOLIN HFA) 108 (90 BASE) MCG/ACT inhaler Inhale 2 puffs into the lungs every 6 (six) hours as needed for wheezing or shortness of breath (wheezing). 10/29/14  Yes Aldean Jewett, MD  amitriptyline (ELAVIL) 25 MG tablet Take 1 tablet (25 mg total) by mouth at bedtime. 10/29/14  Yes Aldean Jewett, MD  amLODipine (NORVASC) 10 MG tablet Take 1 tablet (10 mg total) by mouth daily. 10/29/14  Yes Aldean Jewett, MD  cyclobenzaprine (FLEXERIL) 10 MG tablet Take 1 tablet (10 mg total) by mouth every 8 (eight) hours as needed for muscle spasms (PRN pain. Do not drive or operate heavy machinery while taking as can cause drowsiness.). 10/29/14  Yes Aldean Jewett,  MD  ferrous sulfate 325 (65 FE) MG tablet Take 1 tablet (325 mg total) by mouth 3 (three) times daily with meals. 10/29/14  Yes Aldean Jewett, MD  ibuprofen (ADVIL,MOTRIN) 800 MG tablet Take 1 tablet (800 mg total) by mouth every 8 (eight) hours as needed for mild pain or moderate pain. 10/02/14  Yes Marylene Land, NP  lisinopril (PRINIVIL,ZESTRIL) 20 MG tablet Take 1 tablet (20 mg total) by mouth daily. 10/29/14  Yes Aldean Jewett, MD  mometasone-formoterol [facility]) 200-5 MCG/ACT  AERO Inhale 2 puffs into the lungs 2 (two) times daily. 10/29/14  Yes Aldean Jewett, MD  montelukast (SINGULAIR) 10 MG tablet Take 1 tablet (10 mg total) by mouth at bedtime. 10/29/14  Yes Aldean Jewett, MD  mupirocin ointment (BACTROBAN) 2 % Apply topically 2 (two) times daily. To bump on right thigh 10/29/14  Yes Aldean Jewett, MD  HYDROcodone-acetaminophen (NORCO) 5-325 MG per tablet Take 1 tablet by mouth every 6 (six) hours as needed for moderate pain. Patient not taking: Reported on 06/14/2015 10/29/14   Aldean Jewett, MD  SUMAtriptan (IMITREX) 50 MG tablet Take 1 tablet (50 mg total) by mouth every 2 (two) hours as needed for migraine or headache. May repeat in 2 hours if headache persists or recurs. Patient not taking: Reported on 06/14/2015 10/29/14   Aldean Jewett, MD      PHYSICAL EXAMINATION:   VITAL SIGNS: Blood pressure 139/65, pulse 83, temperature 98.5 F (36.9 C), resp. rate 21, SpO2 100 %.  GENERAL:  40 y.o.-year-old patient lying in the bed in mild distress.  EYES: Pupils equal, round, reactive to light and accommodation. No scleral icterus. Extraocular muscles intact. Swelling around the eyes. HEENT: Head atraumatic, normocephalic. Oropharynx and nasopharynx clear.  Facial swelling noted.Lower lip swelling noted.Swelling of neck noted. NECK:  Supple, no jugular venous distention. Swelling of bilateral neck noted. LUNGS: Normal breath sounds bilaterally, no wheezing, rales,rhonchi or crepitation. No use of accessory muscles of respiration.  CARDIOVASCULAR: S1, S2 normal. No murmurs, rubs, or gallops.  ABDOMEN: Soft, nontender, nondistended. Bowel sounds present. No organomegaly or mass.  EXTREMITIES: No pedal edema, cyanosis, or clubbing.  NEUROLOGIC: Cranial nerves II through XII are intact. Muscle strength 5/5 in all extremities. Sensation intact. Gait normal. PSYCHIATRIC: The patient is alert and oriented x 3.  SKIN: No obvious rash, lesion, or ulcer.    LABORATORY PANEL:   CBC No results for input(s): WBC, HGB, HCT, PLT, MCV, MCH, MCHC, RDW, LYMPHSABS, MONOABS, EOSABS, BASOSABS, BANDABS in the last 168 hours.  Invalid input(s): NEUTRABS, BANDSABD ------------------------------------------------------------------------------------------------------------------  Chemistries  No results for input(s): NA, K, CL, CO2, GLUCOSE, BUN, CREATININE, CALCIUM, MG, AST, ALT, ALKPHOS, BILITOT in the last 168 hours.  Invalid input(s): GFRCGP ------------------------------------------------------------------------------------------------------------------ CrCl cannot be calculated (Unknown ideal weight.). ------------------------------------------------------------------------------------------------------------------ No results for input(s): TSH, T4TOTAL, T3FREE, THYROIDAB in the last 72 hours.  Invalid input(s): FREET3   Coagulation profile No results for input(s): INR, PROTIME in the last 168 hours. ------------------------------------------------------------------------------------------------------------------- No results for input(s): DDIMER in the last 72 hours. -------------------------------------------------------------------------------------------------------------------  Cardiac Enzymes No results for input(s): CKMB, TROPONINI, MYOGLOBIN in the last 168 hours.  Invalid input(s): CK ------------------------------------------------------------------------------------------------------------------ Invalid input(s): POCBNP  ---------------------------------------------------------------------------------------------------------------  Urinalysis    Component Value Date/Time   COLORURINE YELLOW* 10/26/2014 2224   COLORURINE Yellow 04/20/2012 1349   APPEARANCEUR CLEAR* 10/26/2014 2224   APPEARANCEUR Hazy 04/20/2012 1349   LABSPEC 1.015 10/26/2014 2224   LABSPEC 1.020 04/20/2012 1349   PHURINE 6.0  10/26/2014 [place] 5.0  04/20/2012 [place] 6 10/26/2014 2224   GLUCOSEU Negative 04/20/2012 1349   HGBUR NEGATIVE 10/26/2014 2224   HGBUR Negative 04/20/2012 [place] 10/26/2014 2224   BILIRUBINUR Negative 04/20/2012 1349   KETONESUR NEGATIVE 10/26/2014 2224   KETONESUR Negative 04/20/2012 1349   PROTEINUR NEGATIVE 10/26/2014 2224   PROTEINUR Negative 04/20/2012 1349   NITRITE NEGATIVE 10/26/2014 2224   NITRITE Negative 04/20/2012 1349   LEUKOCYTESUR NEGATIVE 10/26/2014 2224   LEUKOCYTESUR Negative 04/20/2012 1349     RADIOLOGY: No results found.  EKG: Orders placed or performed during the hospital encounter of 10/26/14  . ED EKG  . ED EKG  . EKG 12-Lead  . EKG 12-Lead  . EKG 12-Lead  . EKG 12-Lead    IMPRESSION AND PLAN: 40 year old female patient with history of hypertension, bronchial last month presented with the lower lip swelling, neck swelling and facial swelling. Admitting diagnosis 1. Angioedema probably secondary to ACE Inhibitor. 2. Leukocytosis 3. Hypertension 4. Bronchial asthma stable 5. Hyponatremia Treatment plan Mid patient to stepdown unit for close monitoring IV Solu-Medrol 125 mg every 6 hourly Neb treatments   follow-up WBC count When necessary IV hydralazine for blood pressure control Stop ACE inhibitor Supportive care.  All the records are reviewed and case discussed with ED provider. Management plans discussed with the patient, family and they are in agreement.  CODE STATUS:FULL Code Status History    Date Active Date Inactive Code Status Order ID Comments User Context   10/27/2014  2:47 AM 10/29/2014  4:40 PM Full Code [id number]  Harrie Foreman, MD Inpatient       TOTAL TIME TAKING CARE OF THIS PATIENT: 52 minutes.    Saundra Shelling M.D on 06/15/2015 at 12:13 AM  Between 7am to 6pm - Pager - [phone]  After 6pm go to [email or web address] - password [email or web address]  [place] [facility]  Office   [phone]  CC: Primary care physician; No PCP Per Patient

## 2015-06-15 NOTE — Progress Notes (Signed)
Anticoagulation monitoring(Lovenox):  40 yo  ordered Lovenox 40 mg Q24h  Filed Weights   06/15/15 0200  Weight: 236 lb 1.8 oz (107.1 kg)   BMI 40.6  Lab Results  Component Value Date   CREATININE 0.78 06/15/2015   CREATININE 0.77 10/29/2014   CREATININE 0.52 10/28/2014   Estimated Creatinine Clearance: 113.9 mL/min (by C-G formula based on Cr of 0.78). Hemoglobin & Hematocrit     Component Value Date/Time   HGB 8.8* 06/15/2015 0406   HCT 30.2* 06/15/2015 0406     Per Protocol for Patient with estCrcl< 30 ml/min and BMI < 40, will transition to Lovenox 40 mg Q12h.

## 2015-06-16 LAB — TROPONIN I: Troponin I: <0.03 ng/mL (ref <0.031)

## 2015-06-16 LAB — GLUCOSE, CAPILLARY: Glucose-Capillary: 119 mg/dL — ABNORMAL HIGH (ref 65–99)

## 2015-06-16 MED ORDER — PNEUMOCOCCAL VAC POLYVALENT 25 MCG/0.5ML IJ INJ
0.5000 mL | INJECTION | INTRAMUSCULAR | Status: AC
  Administered 2015-06-16: 0.5 mL via INTRAMUSCULAR
  Filled 2015-06-16: qty 0.5

## 2015-06-16 MED ORDER — INFLUENZA VAC SPLIT QUAD 0.5 ML IM SUSY
0.5000 mL | PREFILLED_SYRINGE | INTRAMUSCULAR | Status: AC
  Administered 2015-06-16: 0.5 mL via INTRAMUSCULAR
  Filled 2015-06-16: qty 0.5

## 2015-06-16 MED ORDER — FAMOTIDINE 20 MG PO TABS: 20.0000 mg | ORAL_TABLET | Freq: Two times a day (BID) | ORAL | Status: DC

## 2015-06-16 MED ORDER — PREDNISONE 10 MG PO TABS: 50.0000 mg | ORAL_TABLET | Freq: Every day | ORAL | Status: DC

## 2015-06-16 MED ORDER — DIPHENHYDRAMINE HCL 25 MG PO CAPS: 25.0000 mg | ORAL_CAPSULE | Freq: Three times a day (TID) | ORAL | Status: DC

## 2015-06-16 NOTE — Discharge Summary (Signed)
[facility] [place] NAME: Kelly Byrd    MR#:  [id number]  DATE OF BIRTH:  [birth year]  DATE OF ADMISSION:  06/14/2015 ADMITTING PHYSICIAN: Saundra Shelling, MD  DATE OF DISCHARGE: 06/16/2015  PRIMARY CARE PHYSICIAN: No PCP Per Patient    ADMISSION DIAGNOSIS:  Angioedema, initial encounter [T78.3XXA]  DISCHARGE DIAGNOSIS:  Angioedema secondary to lisinopril Hypertension  SECONDARY DIAGNOSIS:   Past Medical History  Diagnosis Date  . Asthma   . Hypertension     HOSPITAL COURSE:   Kelly Byrd is a 40 y.o. female with a known history of hypertension, bronchial asthma presented to the emergency room with swelling of the lower lip, face and neck. Swelling of the lower lip started around evening yesterday. It progressively increased in size and was associated later on with swelling of the eyelids, face and the neck area  1. Angioedema probably secondary to ACE Inhibitor. Lip swelling improved remarkably. Patient is able to swallow liquids. Tolerating soft diet. Her oxygen sats are more than 92% on room air. She is hemodynamically stable Change to oral prednisone, famotidine, Benadryl Seen by ENT. Flexible laryngoscope did not show swelling over the vocal cords area was patent and open. No signs of respiratory distress overnight -ACE inhibitor was placed and allergy list. Patient advised not to take any form of Ace inhibitors, she voiced understanding.  2. Leukocytosis Appears reactive   3. Hypertension Continue amlodipine  Blood pressure is stable   4. Bronchial asthma stable  5. Headache when necessary Tylenol  This appears to be chronic. Patient has had CT scan of the head in July 2016 was essentially negative.   Will discharge patient to home.  CONSULTS OBTAINED:     DRUG ALLERGIES:   Allergies  Allergen Reactions  . Ace Inhibitors Swelling  . Peanuts [Peanut Oil]     DISCHARGE MEDICATIONS:    Current Discharge Medication List    START taking these medications   Details  diphenhydrAMINE (BENADRYL) 25 mg capsule Take 1 capsule (25 mg total) by mouth every 8 (eight) hours. Qty: 10 capsule, Refills: 0    famotidine (PEPCID) 20 MG tablet Take 1 tablet (20 mg total) by mouth 2 (two) times daily. Qty: 6 tablet, Refills: 0    predniSONE (DELTASONE) 10 MG tablet Take 5 tablets (50 mg total) by mouth daily with breakfast. Qty: 15 tablet, Refills: 0      CONTINUE these medications which have NOT CHANGED   Details  albuterol (PROVENTIL HFA;VENTOLIN HFA) 108 (90 BASE) MCG/ACT inhaler Inhale 2 puffs into the lungs every 6 (six) hours as needed for wheezing or shortness of breath (wheezing). Qty: 1 Inhaler, Refills: 6    amitriptyline (ELAVIL) 25 MG tablet Take 1 tablet (25 mg total) by mouth at bedtime. Qty: 30 tablet, Refills: 1    amLODipine (NORVASC) 10 MG tablet Take 1 tablet (10 mg total) by mouth daily. Qty: 30 tablet, Refills: 1    cyclobenzaprine (FLEXERIL) 10 MG tablet Take 1 tablet (10 mg total) by mouth every 8 (eight) hours as needed for muscle spasms (PRN pain. Do not drive or operate heavy machinery while taking as can cause drowsiness.). Qty: 12 tablet, Refills: 0    ferrous sulfate 325 (65 FE) MG tablet Take 1 tablet (325 mg total) by mouth 3 (three) times daily with meals. Qty: 90 tablet, Refills: 3    ibuprofen (ADVIL,MOTRIN) 800 MG tablet Take 1 tablet (800 mg total) by mouth every 8 (  eight) hours as needed for mild pain or moderate pain. Qty: 15 tablet, Refills: 0    mometasone-formoterol (DULERA) 200-5 MCG/ACT AERO Inhale 2 puffs into the lungs 2 (two) times daily. Qty: 1 Inhaler, Refills: 6    montelukast (SINGULAIR) 10 MG tablet Take 1 tablet (10 mg total) by mouth at bedtime. Qty: 30 tablet, Refills: 1    mupirocin ointment (BACTROBAN) 2 % Apply topically 2 (two) times daily. To bump on right thigh Qty: 15 g, Refills: 0        If you experience  worsening of your admission symptoms, develop shortness of breath, life threatening emergency, suicidal or homicidal thoughts you must seek medical attention immediately by calling 911 or calling your MD immediately  if symptoms less severe.  You Must read complete instructions/literature along with all the possible adverse reactions/side effects for all the Medicines you take and that have been prescribed to you. Take any new Medicines after you have completely understood and accept all the possible adverse reactions/side effects.   Please note  You were cared for by a hospitalist during your hospital stay. If you have any questions about your discharge medications or the care you received while you were in the hospital after you are discharged, you can call the unit and asked to speak with the hospitalist on call if the hospitalist that took care of you is not available. Once you are discharged, your primary care physician will handle any further medical issues. Please note that NO REFILLS for any discharge medications will be authorized once you are discharged, as it is imperative that you return to your primary care physician (or establish a relationship with a primary care physician if you do not have one) for your aftercare needs so that they can reassess your need for medications and monitor your lab values. Today   SUBJECTIVE   Swelling much improved. Tolerating diet. Headache  VITAL SIGNS:  Blood pressure 127/58, pulse 71, temperature 99.1 F (37.3 C), temperature source Oral, resp. rate 18, height 5\' 4"  (1.626 m), weight 107.1 kg (236 lb 1.8 oz), SpO2 100 %.  I/O:   Intake/Output Summary (Last 24 hours) at 06/16/15 0754 Last data filed at 06/15/15 1054  Gross per 24 hour  Intake      3 ml  Output      0 ml  Net      3 ml    PHYSICAL EXAMINATION:  GENERAL:  40 y.o.-year-old patient lying in the bed with no acute distress.  EYES: Pupils equal, round, reactive to light and  accommodation. No scleral icterus. Extraocular muscles intact.  HEENT: Head atraumatic, normocephalic. Oropharynx and nasopharynx clear.  NECK:  Supple, no jugular venous distention. No thyroid enlargement, no tenderness.  LUNGS: Normal breath sounds bilaterally, no wheezing, rales,rhonchi or crepitation. No use of accessory muscles of respiration.  CARDIOVASCULAR: S1, S2 normal. No murmurs, rubs, or gallops.  ABDOMEN: Soft, non-tender, non-distended. Bowel sounds present. No organomegaly or mass.  EXTREMITIES: No pedal edema, cyanosis, or clubbing.  NEUROLOGIC: Cranial nerves II through XII are intact. Muscle strength 5/5 in all extremities. Sensation intact. Gait not checked.  PSYCHIATRIC: The patient is alert and oriented x 3.  SKIN: No obvious rash, lesion, or ulcer.   DATA REVIEW:   CBC   Recent Labs Lab 06/15/15 0406  WBC 15.4*  HGB 8.8*  HCT 30.2*  PLT 475*    Chemistries   Recent Labs Lab 06/15/15 0406  NA 136  K 3.7  CL 108  CO2 21*  GLUCOSE 160*  BUN 11  CREATININE 0.78  CALCIUM 9.1    Microbiology Results   Recent Results (from the past 240 hour(s))  MRSA PCR Screening     Status: Abnormal   Collection Time: 06/15/15  2:27 AM  Result Value Ref Range Status   MRSA by PCR POSITIVE (A) NEGATIVE Final    Comment:        The GeneXpert MRSA Assay (FDA approved for NASAL specimens only), is one component of a comprehensive MRSA colonization surveillance program. It is not intended to diagnose MRSA infection nor to guide or monitor treatment for MRSA infections. CRITICAL RESULT CALLED TO, READ BACK BY AND VERIFIED WITH: Tia Masker AT [id number] 06/15/15.PMH     RADIOLOGY:  No results found.   Management plans discussed with the patient, family and they are in agreement.  CODE STATUS:     Code Status Orders        Start     Ordered   06/15/15 0224  Full code   Continuous     06/15/15 0223    Code Status History    Date Active Date  Inactive Code Status Order ID Comments User Context   10/27/2014  2:47 AM 10/29/2014  4:40 PM Full Code [id number]  Harrie Foreman, MD Inpatient      TOTAL TIME TAKING CARE OF THIS PATIENT: 40 minutes.    Renlee Floor M.D on 06/16/2015 at 7:54 AM  Between 7am to 6pm - Pager - [phone] After 6pm go to [email or web address] - password [email or web address]  [place] [facility]  Office  [phone]  CC: Primary care physician; No PCP Per Patient

## 2015-06-16 NOTE — Progress Notes (Signed)
Patient Dc'd this date. VS stable upon discharge. Discharge plan reviewed with pt, with verbal understanding. [facility] Rx sent to [facility]. Volunteer services contacted for transport.

## 2015-06-17 ENCOUNTER — Other Ambulatory Visit: Payer: Self-pay

## 2015-06-17 NOTE — Telephone Encounter (Signed)
[facility] to refill amlodipine 10 mg tab

## 2015-06-19 ENCOUNTER — Other Ambulatory Visit: Payer: Self-pay | Admitting: Urology

## 2015-06-19 MED ORDER — AMLODIPINE BESYLATE 10 MG PO TABS: 10.0000 mg | ORAL_TABLET | Freq: Every day | ORAL | Status: DC

## 2015-06-23 LAB — ALLERGENS(10) FOODS
Allergen Corn, IgE: 1.67 kU/L — AB
F045-IGE YEAST: 0.27 kU/L — AB
F081-IGE CHEESE, CHEDDAR TYPE: UNDETERMINED kU/L
F082-IGE CHEESE, MOLD TYPE: UNDETERMINED kU/L
F245-Ige Egg, Whole: 0.13 kU/L — AB
Malt: UNDETERMINED kU/L
Milk IgE: 0.23 kU/L — AB
PEANUT IGE: 1.83 kU/L — AB
SOYBEAN IGE: 1.53 kU/L — AB
Wheat IgE: 1.64 kU/L — AB

## 2015-08-14 ENCOUNTER — Emergency Department (HOSPITAL_COMMUNITY)
Admission: EM | Admit: 2015-08-14 | Discharge: 2015-08-14 | Disposition: A | Discharge status: Home / Self Care | Payer: No Typology Code available for payment source | Attending: Emergency Medicine | Admitting: Emergency Medicine

## 2015-08-14 ENCOUNTER — Encounter (HOSPITAL_COMMUNITY): Payer: Self-pay | Admitting: *Deleted

## 2015-08-14 ENCOUNTER — Emergency Department (HOSPITAL_COMMUNITY): Payer: No Typology Code available for payment source

## 2015-08-14 DIAGNOSIS — F17210 Nicotine dependence, cigarettes, uncomplicated: Secondary | ICD-10-CM | POA: Insufficient documentation

## 2015-08-14 DIAGNOSIS — Z792 Long term (current) use of antibiotics: Secondary | ICD-10-CM | POA: Insufficient documentation

## 2015-08-14 DIAGNOSIS — I10 Essential (primary) hypertension: Secondary | ICD-10-CM | POA: Insufficient documentation

## 2015-08-14 DIAGNOSIS — Z79899 Other long term (current) drug therapy: Secondary | ICD-10-CM | POA: Insufficient documentation

## 2015-08-14 DIAGNOSIS — R079 Chest pain, unspecified: Secondary | ICD-10-CM | POA: Insufficient documentation

## 2015-08-14 DIAGNOSIS — Z7951 Long term (current) use of inhaled steroids: Secondary | ICD-10-CM | POA: Insufficient documentation

## 2015-08-14 DIAGNOSIS — R51 Headache: Secondary | ICD-10-CM | POA: Insufficient documentation

## 2015-08-14 DIAGNOSIS — Z7952 Long term (current) use of systemic steroids: Secondary | ICD-10-CM | POA: Insufficient documentation

## 2015-08-14 DIAGNOSIS — R202 Paresthesia of skin: Secondary | ICD-10-CM | POA: Insufficient documentation

## 2015-08-14 DIAGNOSIS — R200 Anesthesia of skin: Secondary | ICD-10-CM | POA: Insufficient documentation

## 2015-08-14 DIAGNOSIS — J45909 Unspecified asthma, uncomplicated: Secondary | ICD-10-CM | POA: Insufficient documentation

## 2015-08-14 DIAGNOSIS — R519 Headache, unspecified: Secondary | ICD-10-CM

## 2015-08-14 HISTORY — DX: Dorsalgia, unspecified: M54.9

## 2015-08-14 LAB — BASIC METABOLIC PANEL
Anion gap: 9 (ref 5–15)
BUN: 8 mg/dL (ref 6–20)
CHLORIDE: 105 mmol/L (ref 101–111)
CO2: 26 mmol/L (ref 22–32)
CREATININE: 0.66 mg/dL (ref 0.44–1.00)
Calcium: 9.1 mg/dL (ref 8.9–10.3)
GFR calc Af Amer: >60 mL/min (ref >60)
GFR calc non Af Amer: >60 mL/min (ref >60)
Glucose, Bld: 91 mg/dL (ref 65–99)
Potassium: 3.4 mmol/L — ABNORMAL LOW (ref 3.5–5.1)
Sodium: 140 mmol/L (ref 135–145)

## 2015-08-14 LAB — I-STAT TROPONIN, ED: Troponin i, poc: 0 ng/mL (ref 0.00–0.08)

## 2015-08-14 LAB — CBC
HCT: 26.8 % — ABNORMAL LOW (ref 36.0–46.0)
Hemoglobin: 7.5 g/dL — ABNORMAL LOW (ref 12.0–15.0)
MCH: 17.1 pg — AB (ref 26.0–34.0)
MCHC: 28 g/dL — AB (ref 30.0–36.0)
MCV: 61 fL — AB (ref 78.0–100.0)
PLATELETS: 521 10*3/uL — AB (ref 150–400)
RBC: 4.39 MIL/uL (ref 3.87–5.11)
RDW: 19.7 % — AB (ref 11.5–15.5)
WBC: 12.7 10*3/uL — ABNORMAL HIGH (ref 4.0–10.5)

## 2015-08-14 MED ORDER — DEXAMETHASONE SODIUM PHOSPHATE 10 MG/ML IJ SOLN
10.0000 mg | Freq: Once | INTRAMUSCULAR | Status: AC
  Administered 2015-08-14: 10 mg via INTRAVENOUS
  Filled 2015-08-14: qty 1

## 2015-08-14 MED ORDER — AMLODIPINE BESYLATE 10 MG PO TABS: 10.0000 mg | ORAL_TABLET | Freq: Every day | ORAL | Status: DC

## 2015-08-14 MED ORDER — DIPHENHYDRAMINE HCL 50 MG/ML IJ SOLN
25.0000 mg | Freq: Once | INTRAMUSCULAR | Status: AC
  Administered 2015-08-14: 25 mg via INTRAVENOUS
  Filled 2015-08-14: qty 1

## 2015-08-14 MED ORDER — METOCLOPRAMIDE HCL 5 MG/ML IJ SOLN
10.0000 mg | Freq: Once | INTRAMUSCULAR | Status: AC
  Administered 2015-08-14: 10 mg via INTRAVENOUS
  Filled 2015-08-14: qty 2

## 2015-08-14 NOTE — ED Notes (Signed)
Patient verbalized understanding of discharge instructions and denies any further needs or questions at this time. VS stable. Patient ambulatory with steady gait.  

## 2015-08-14 NOTE — ED Provider Notes (Signed)
CSN: [id number]     Arrival date & time 08/14/15  1454 History   First MD Initiated Contact with Patient 08/14/15 1647     Chief Complaint  Patient presents with  . Headache  . Chest Pain     (Consider location/radiation/quality/duration/timing/severity/associated sxs/prior Treatment) HPI Comments: Patient with a history of Migraines presents today with two separate complaints.  She is complaining of a headache and also chest pain.  She states onset of headache this morning.  Headache is diffuse.  Headache gradual in onset and has been constant since this morning.  She states the headache is associated with numbness and tingling of the left upper and lower extremities.  She denies any vision changes.  She denies head injury or trauma.  Denies nausea or vomiting.  Denies fever, chills, neck pain/stiffness.  She does report associated photophobia.  She has not taken anything for symptoms prior to arrival.  She states that she was admitted at [facility] in July of last year for similar symptoms.  She states that she was told that symptoms may be secondary to migraines.  Review of the chart, shows an admission for HA and CP in July 2016 at [facility].  At that time she had a MRI, which was negative. Headaches were thought to be medication overuse/oversensatization.  She was started on daily Elavil at that time.    Patient also been complaining of chest pain.  She reports that the chest pain began at the same time as the headache earlier this morning and has been constant since that time..  CP has been constant.  Nothing makes the pain better or worse.  No cough, fever, chills, SOB, hemoptysis, dizziness, syncope, or LE edema.    The history is provided by the patient.    Past Medical History  Diagnosis Date  . Asthma   . Hypertension   . Back pain    Past Surgical History  Procedure Laterality Date  . Cesarean section      2   Family History  Problem Relation Age of Onset  .  Hypertension Mother   . Hypertension Father   . Diabetes Mellitus II Maternal Grandmother   . Lupus     Social History  Substance Use Topics  . Smoking status: Current Every Day Smoker    Types: Cigarettes  . Smokeless tobacco: None  . Alcohol Use: No   OB History    No data available     Review of Systems  All other systems reviewed and are negative.     Allergies  Ace inhibitors; Lisinopril; and Peanuts  Home Medications   Prior to Admission medications   Medication Sig Start Date End Date Taking? Authorizing Provider  albuterol (PROVENTIL HFA;VENTOLIN HFA) 108 (90 BASE) MCG/ACT inhaler Inhale 2 puffs into the lungs every 6 (six) hours as needed for wheezing or shortness of breath (wheezing). 10/29/14  Yes Aldean Jewett, MD  amitriptyline (ELAVIL) 25 MG tablet Take 1 tablet (25 mg total) by mouth at bedtime. 10/29/14  Yes Aldean Jewett, MD  amLODipine (NORVASC) 10 MG tablet Take 1 tablet (10 mg total) by mouth daily. 06/19/15  Yes Shannon A McGowan, PA-C  cyclobenzaprine (FLEXERIL) 10 MG tablet Take 1 tablet (10 mg total) by mouth every 8 (eight) hours as needed for muscle spasms (PRN pain. Do not drive or operate heavy machinery while taking as can cause drowsiness.). 10/29/14  Yes Aldean Jewett, MD  diphenhydrAMINE (BENADRYL) 25 mg capsule Take  1 capsule (25 mg total) by mouth every 8 (eight) hours. 06/16/15  Yes Fritzi Mandes, MD  famotidine (PEPCID) 20 MG tablet Take 1 tablet (20 mg total) by mouth 2 (two) times daily. 06/16/15  Yes Fritzi Mandes, MD  ferrous sulfate 325 (65 FE) MG tablet Take 1 tablet (325 mg total) by mouth 3 (three) times daily with meals. 10/29/14  Yes Aldean Jewett, MD  ibuprofen (ADVIL,MOTRIN) 800 MG tablet Take 1 tablet (800 mg total) by mouth every 8 (eight) hours as needed for mild pain or moderate pain. 10/02/14  Yes Marylene Land, NP  mometasone-formoterol (DULERA) 200-5 MCG/ACT AERO Inhale 2 puffs into the lungs 2 (two) times daily. 10/29/14  Yes  Aldean Jewett, MD  montelukast (SINGULAIR) 10 MG tablet Take 1 tablet (10 mg total) by mouth at bedtime. 10/29/14  Yes Aldean Jewett, MD  mupirocin ointment (BACTROBAN) 2 % Apply topically 2 (two) times daily. To bump on right thigh 10/29/14  Yes Aldean Jewett, MD  predniSONE (DELTASONE) 10 MG tablet Take 5 tablets (50 mg total) by mouth daily with breakfast. 06/16/15  Yes Fritzi Mandes, MD   BP 155/84 mmHg  Pulse 85  Temp(Src) 98.2 F (36.8 C) (Oral)  Resp 18  SpO2 100%  LMP 08/10/2015 Physical Exam  Constitutional: She appears well-developed and well-nourished. No distress.  HENT:  Head: Normocephalic and atraumatic.  Mouth/Throat: Oropharynx is clear and moist.  Eyes: EOM are normal. Pupils are equal, round, and reactive to light.  Neck: Normal range of motion. Neck supple.  Cardiovascular: Normal rate, regular rhythm and normal heart sounds.   Pulmonary/Chest: Effort normal and breath sounds normal.  Musculoskeletal: Normal range of motion.  Neurological: She is alert. She has normal strength. No cranial nerve deficit or sensory deficit. Coordination and gait normal.  Distal sensation of all extremities intact and equal bilaterally Normal gait, no ataxia  Skin: Skin is warm and dry. No rash noted. She is not diaphoretic.  Psychiatric: She has a normal mood and affect.  Nursing note and vitals reviewed.   ED Course  Procedures (including critical care time) Labs Review Labs Reviewed  BASIC METABOLIC PANEL - Abnormal; Notable for the following:    Potassium 3.4 (*)    All other components within normal limits  CBC - Abnormal; Notable for the following:    WBC 12.7 (*)    Hemoglobin 7.5 (*)    HCT 26.8 (*)    MCV 61.0 (*)    MCH 17.1 (*)    MCHC 28.0 (*)    RDW 19.7 (*)    Platelets 521 (*)    All other components within normal limits  I-STAT TROPOININ, ED    Imaging Review Dg Chest 2 View  08/14/2015  CLINICAL DATA:  Severe headache, left chest pain and left  arm tingling beginning 1 hour ago. No known injury. Initial encounter. EXAM: CHEST  2 VIEW COMPARISON:  PA and lateral chest 09/28/2014. FINDINGS: There is mild cardiomegaly without edema. The lungs are clear. No pneumothorax or pleural effusion. No focal bony abnormality. IMPRESSION: Mild cardiomegaly without acute disease. Electronically Signed   By: Inge Rise M.D.   On: 08/14/2015 15:30   I have personally reviewed and evaluated these images and lab results as part of my medical decision-making.   EKG Interpretation   Date/Time:  Thursday Aug 14 2015 15:07:21 EDT Ventricular Rate:  85 PR Interval:  166 QRS Duration: 90 QT Interval:  414 QTC Calculation: 492 R  Axis:   47 Text Interpretation:  Normal sinus rhythm Prolonged QT similar to previous  from 06/15/15 Confirmed by Wilson Singer  MD, [place] ([id number]) on 08/14/2015 5:36:12  PM     7:20 PM Reassessed patient.  She reports significant improvement in symptoms at this time. MDM   Final diagnoses:  None   Patient presents today with HA and numbness/tinglilng of the left side of her body.  She has a history of the same and had a MRI brain in July 2016 for similar symptoms, which was negative.  She has a normal neurological exam.  She is afebrile with no nuchal rigidity on exam.  Pain significantly improved in the ED.  Therefore, do not feel that imaging is indicated at this time.  Patient also reports with CP that has been constant this morning.  No ischemic changes on EKG.  Troponin negative.  CXR is also negative.  Feel that the patient is stable for discharge.  Return precautions given.     Hyman Bible, PA-C 08/17/15 1631  Virgel Manifold, MD 08/21/15 [phone]

## 2015-08-14 NOTE — Progress Notes (Signed)
CM met with patient at bedside to discuss Primary Care, patient reports not having insurance or PCP. Discussed the importance and benefits of having a PCP, patient is verbalized understanding and is agreeable. Provided patient with information on the [facility], patient is agreeable with establishing care at the clinic. Appt scheduled for 6/2 at [place], clinic contact information given and placed on AVS. No further ED CM needs identified.

## 2015-08-14 NOTE — ED Notes (Signed)
Pt reports headache, left chest pain, lightheadedness, and left arm tingling that started today.

## 2015-08-14 NOTE — ED Notes (Signed)
Gave pt sandwich and water, per [employer]

## 2015-08-29 ENCOUNTER — Encounter: Payer: Self-pay | Admitting: Family Medicine

## 2015-08-29 ENCOUNTER — Ambulatory Visit: Payer: Self-pay | Attending: Family Medicine | Admitting: Family Medicine

## 2015-08-29 VITALS — BP 138/85 | HR 88 | Temp 98.0°F | Ht 64.0 in | Wt 244.0 lb

## 2015-08-29 DIAGNOSIS — I10 Essential (primary) hypertension: Secondary | ICD-10-CM | POA: Insufficient documentation

## 2015-08-29 DIAGNOSIS — J45909 Unspecified asthma, uncomplicated: Secondary | ICD-10-CM | POA: Insufficient documentation

## 2015-08-29 DIAGNOSIS — M546 Pain in thoracic spine: Secondary | ICD-10-CM

## 2015-08-29 DIAGNOSIS — N921 Excessive and frequent menstruation with irregular cycle: Secondary | ICD-10-CM

## 2015-08-29 DIAGNOSIS — D509 Iron deficiency anemia, unspecified: Secondary | ICD-10-CM

## 2015-08-29 DIAGNOSIS — G43809 Other migraine, not intractable, without status migrainosus: Secondary | ICD-10-CM

## 2015-08-29 DIAGNOSIS — M549 Dorsalgia, unspecified: Secondary | ICD-10-CM | POA: Insufficient documentation

## 2015-08-29 DIAGNOSIS — J4530 Mild persistent asthma, uncomplicated: Secondary | ICD-10-CM

## 2015-08-29 MED ORDER — AMLODIPINE BESYLATE 10 MG PO TABS: 10.0000 mg | ORAL_TABLET | Freq: Every day | ORAL | Status: DC

## 2015-08-29 MED ORDER — KETOROLAC TROMETHAMINE 60 MG/2ML IM SOLN: 60.0000 mg | Freq: Once | INTRAMUSCULAR | Status: AC

## 2015-08-29 MED ORDER — IBUPROFEN 600 MG PO TABS: 600.0000 mg | ORAL_TABLET | Freq: Two times a day (BID) | ORAL | Status: DC | PRN

## 2015-08-29 MED ORDER — ALBUTEROL SULFATE HFA 108 (90 BASE) MCG/ACT IN AERS: 2.0000 | INHALATION_SPRAY | Freq: Four times a day (QID) | RESPIRATORY_TRACT | Status: DC | PRN

## 2015-08-29 MED ORDER — MONTELUKAST SODIUM 10 MG PO TABS: 10.0000 mg | ORAL_TABLET | Freq: Every day | ORAL | Status: DC

## 2015-08-29 MED ORDER — AMITRIPTYLINE HCL 25 MG PO TABS: 25.0000 mg | ORAL_TABLET | Freq: Every day | ORAL | Status: DC

## 2015-08-29 MED ORDER — CYCLOBENZAPRINE HCL 10 MG PO TABS: 10.0000 mg | ORAL_TABLET | Freq: Three times a day (TID) | ORAL | Status: DC | PRN

## 2015-08-29 MED ORDER — MOMETASONE FURO-FORMOTEROL FUM 200-5 MCG/ACT IN AERO: 2.0000 | INHALATION_SPRAY | Freq: Two times a day (BID) | RESPIRATORY_TRACT | Status: DC

## 2015-08-29 MED ORDER — KETOROLAC TROMETHAMINE 60 MG/2ML IM SOLN
60.0000 mg | Freq: Once | INTRAMUSCULAR | Status: AC
  Administered 2015-08-29: 60 mg via INTRAMUSCULAR

## 2015-08-29 NOTE — Progress Notes (Signed)
Subjective:  Patient ID: Kelly Byrd, female    DOB: 25-Aug-1975  Age: 40 y.o. MRN: [id number]  CC: hosp f/u htn   HPI Kelly Byrd is a 40 year old female with a history of hypertension, asthma, migraine, anemia who comes into the clinic for a follow-up from the ED after presenting with headaches and left-sided tingling. Neuro exam was normal and so were her labs and EKG; she did not have any brain imaging.  She informs me today she still has headaches which occur every day with associated photophobia, nausea and vomiting; her chart reveals she should be on Elavil which she informs me she has not been taking. She denies any numbness or tingling of any side of her body and has no weakness or speech difficulty.  She does have right-sided thoracic back pain which hurts when she moves her back and was previously on Flexeril. Currently on asthma medications and is requesting refill them; reports no recent exacerbation but has had ICU admissions in the past.  She has chronic anemia which she says is secondary to menorrhagia from fibroids and is on ferrous sulfate.   Outpatient Prescriptions Prior to Visit  Medication Sig Dispense Refill  . amLODipine (NORVASC) 10 MG tablet Take 1 tablet (10 mg total) by mouth daily. 30 tablet 0  . diphenhydrAMINE (BENADRYL) 25 mg capsule Take 1 capsule (25 mg total) by mouth every 8 (eight) hours. (Patient not taking: Reported on 08/29/2015) 10 capsule 0  . famotidine (PEPCID) 20 MG tablet Take 1 tablet (20 mg total) by mouth 2 (two) times daily. (Patient not taking: Reported on 08/29/2015) 6 tablet 0  . ferrous sulfate 325 (65 FE) MG tablet Take 1 tablet (325 mg total) by mouth 3 (three) times daily with meals. (Patient not taking: Reported on 08/29/2015) 90 tablet 3  . albuterol (PROVENTIL HFA;VENTOLIN HFA) 108 (90 BASE) MCG/ACT inhaler Inhale 2 puffs into the lungs every 6 (six) hours as needed for wheezing or shortness of breath (wheezing).  (Patient not taking: Reported on 08/29/2015) 1 Inhaler 6  . amitriptyline (ELAVIL) 25 MG tablet Take 1 tablet (25 mg total) by mouth at bedtime. (Patient not taking: Reported on 08/29/2015) 30 tablet 1  . cyclobenzaprine (FLEXERIL) 10 MG tablet Take 1 tablet (10 mg total) by mouth every 8 (eight) hours as needed for muscle spasms (PRN pain. Do not drive or operate heavy machinery while taking as can cause drowsiness.). (Patient not taking: Reported on 08/29/2015) 12 tablet 0  . ibuprofen (ADVIL,MOTRIN) 800 MG tablet Take 1 tablet (800 mg total) by mouth every 8 (eight) hours as needed for mild pain or moderate pain. (Patient not taking: Reported on 08/29/2015) 15 tablet 0  . mometasone-formoterol (DULERA) 200-5 MCG/ACT AERO Inhale 2 puffs into the lungs 2 (two) times daily. (Patient not taking: Reported on 08/29/2015) 1 Inhaler 6  . montelukast (SINGULAIR) 10 MG tablet Take 1 tablet (10 mg total) by mouth at bedtime. (Patient not taking: Reported on 08/29/2015) 30 tablet 1  . mupirocin ointment (BACTROBAN) 2 % Apply topically 2 (two) times daily. To bump on right thigh (Patient not taking: Reported on 08/29/2015) 15 g 0  . predniSONE (DELTASONE) 10 MG tablet Take 5 tablets (50 mg total) by mouth daily with breakfast. (Patient not taking: Reported on 08/29/2015) 15 tablet 0   No facility-administered medications prior to visit.    ROS Review of Systems  Constitutional: Negative for activity change, appetite change and fatigue.  HENT: Negative for congestion, sinus  pressure and sore throat.   Eyes: Negative for visual disturbance.  Respiratory: Negative for cough, chest tightness, shortness of breath and wheezing.   Cardiovascular: Negative for chest pain and palpitations.  Gastrointestinal: Negative for abdominal pain, constipation and abdominal distention.  Endocrine: Negative for polydipsia.  Genitourinary: Negative for dysuria and frequency.  Musculoskeletal: Positive for back pain. Negative for arthralgias.   Skin: Negative for rash.  Neurological: Positive for headaches. Negative for tremors, light-headedness and numbness.  Hematological: Does not bruise/bleed easily.  Psychiatric/Behavioral: Negative for behavioral problems and agitation.    Objective:  BP 138/85 mmHg  Pulse 88  Temp(Src) 98 F (36.7 C)  Ht 5\' 4"  (1.626 m)  Wt 244 lb (110.678 kg)  BMI 41.86 kg/m2  LMP 08/10/2015  BP/Weight 08/29/2015 08/14/2015 [id number]  Systolic BP 0000000 [id number]  Diastolic BP 85 75 82  Wt. (Lbs) 244 - -  BMI 41.86 - -      Physical Exam  Constitutional: She is oriented to person, place, and time.  Obese, acutely ill looking  Cardiovascular: Normal rate, normal heart sounds and intact distal pulses.   No murmur heard. Pulmonary/Chest: Effort normal and breath sounds normal. She has no wheezes. She has no rales. She exhibits no tenderness.  Abdominal: Soft. Bowel sounds are normal. She exhibits no distension and no mass. There is no tenderness.  Musculoskeletal: She exhibits tenderness (tenderness on palpation of right thoracic back muscle).  Neurological: She is alert and oriented to person, place, and time.     CBC Latest Ref Rng 08/14/2015 06/15/2015 10/29/2014  WBC 4.0 - 10.5 K/uL 12.7(H) 15.4(H) 25.1(H)  Hemoglobin 12.0 - 15.0 g/dL 7.5(L) 8.8(L) 8.3(L)  Hematocrit 36.0 - 46.0 % 26.8(L) 30.2(L) 28.9(L)  Platelets 150 - 400 K/uL 521(H) 475(H) 561(H)      Assessment & Plan:   1. Essential hypertension Controlled - amLODipine (NORVASC) 10 MG tablet; Take 1 tablet (10 mg total) by mouth daily.  Dispense: 30 tablet; Refill: 3  2. Anemia, iron deficiency Likely secondary to menorrhagia Continue ferrous sulfate  3. Menometrorrhagia Secondary to fibroids Once she obtains the [facility] discount she will be referred to GYN-advised to stop at the front desk to obtain application  4. Other type of nonintractable migraine Uncontrolled she has not been planned with Elavil We'll reassess for  improvement at next visit - amitriptyline (ELAVIL) 25 MG tablet; Take 1 tablet (25 mg total) by mouth at bedtime.  Dispense: 30 tablet; Refill: 3  5. Asthma, mild persistent, uncomplicated Uncontrolled - montelukast (SINGULAIR) 10 MG tablet; Take 1 tablet (10 mg total) by mouth at bedtime.  Dispense: 30 tablet; Refill: 3 - mometasone-formoterol (DULERA) 200-5 MCG/ACT AERO; Inhale 2 puffs into the lungs 2 (two) times daily.  Dispense: 1 Inhaler; Refill: 3 - albuterol (PROVENTIL HFA;VENTOLIN HFA) 108 (90 Base) MCG/ACT inhaler; Inhale 2 puffs into the lungs every 6 (six) hours as needed for wheezing or shortness of breath (wheezing).  Dispense: 1 Inhaler; Refill: 3  6. Right-sided thoracic back pain The muscle spasm - ketorolac (TORADOL) injection 60 mg; Inject 2 mLs (60 mg total) into the muscle once. - cyclobenzaprine (FLEXERIL) 10 MG tablet; Take 1 tablet (10 mg total) by mouth every 8 (eight) hours as needed for muscle spasms.  Dispense: 30 tablet; Refill: 0 - ibuprofen (ADVIL,MOTRIN) 600 MG tablet; Take 1 tablet (600 mg total) by mouth every 12 (twelve) hours as needed for mild pain or moderate pain.  Dispense: 60 tablet; Refill: 2 - ketorolac (TORADOL)  injection 60 mg; Inject 2 mLs (60 mg total) into the muscle once.   Meds ordered this encounter  Medications  . DISCONTD: amLODipine (NORVASC) 10 MG tablet    Sig: Take 1 tablet (10 mg total) by mouth daily.    Dispense:  30 tablet    Refill:  3  . DISCONTD: amitriptyline (ELAVIL) 25 MG tablet    Sig: Take 1 tablet (25 mg total) by mouth at bedtime.    Dispense:  30 tablet    Refill:  3  . DISCONTD: mometasone-formoterol (DULERA) 200-5 MCG/ACT AERO    Sig: Inhale 2 puffs into the lungs 2 (two) times daily.    Dispense:  1 Inhaler    Refill:  3  . DISCONTD: montelukast (SINGULAIR) 10 MG tablet    Sig: Take 1 tablet (10 mg total) by mouth at bedtime.    Dispense:  30 tablet    Refill:  3  . DISCONTD: albuterol (PROVENTIL  HFA;VENTOLIN HFA) 108 (90 Base) MCG/ACT inhaler    Sig: Inhale 2 puffs into the lungs every 6 (six) hours as needed for wheezing or shortness of breath (wheezing).    Dispense:  1 Inhaler    Refill:  3  . DISCONTD: cyclobenzaprine (FLEXERIL) 10 MG tablet    Sig: Take 1 tablet (10 mg total) by mouth every 8 (eight) hours as needed for muscle spasms.    Dispense:  30 tablet    Refill:  0  . ketorolac (TORADOL) injection 60 mg    Sig:   . DISCONTD: ibuprofen (ADVIL,MOTRIN) 600 MG tablet    Sig: Take 1 tablet (600 mg total) by mouth every 12 (twelve) hours as needed for mild pain or moderate pain.    Dispense:  60 tablet    Refill:  2  . montelukast (SINGULAIR) 10 MG tablet    Sig: Take 1 tablet (10 mg total) by mouth at bedtime.    Dispense:  30 tablet    Refill:  3  . amLODipine (NORVASC) 10 MG tablet    Sig: Take 1 tablet (10 mg total) by mouth daily.    Dispense:  30 tablet    Refill:  3  . cyclobenzaprine (FLEXERIL) 10 MG tablet    Sig: Take 1 tablet (10 mg total) by mouth every 8 (eight) hours as needed for muscle spasms.    Dispense:  30 tablet    Refill:  0  . mometasone-formoterol (DULERA) 200-5 MCG/ACT AERO    Sig: Inhale 2 puffs into the lungs 2 (two) times daily.    Dispense:  1 Inhaler    Refill:  3  . albuterol (PROVENTIL HFA;VENTOLIN HFA) 108 (90 Base) MCG/ACT inhaler    Sig: Inhale 2 puffs into the lungs every 6 (six) hours as needed for wheezing or shortness of breath (wheezing).    Dispense:  1 Inhaler    Refill:  3  . ibuprofen (ADVIL,MOTRIN) 600 MG tablet    Sig: Take 1 tablet (600 mg total) by mouth every 12 (twelve) hours as needed for mild pain or moderate pain.    Dispense:  60 tablet    Refill:  2  . amitriptyline (ELAVIL) 25 MG tablet    Sig: Take 1 tablet (25 mg total) by mouth at bedtime.    Dispense:  30 tablet    Refill:  3  . ketorolac (TORADOL) injection 60 mg    Sig:     Follow-up: Return in about 3 weeks (around 09/19/2015) for Follow-up  on  migraines.   Arnoldo Morale MD

## 2015-08-29 NOTE — Patient Instructions (Signed)

## 2015-09-19 ENCOUNTER — Encounter: Payer: Self-pay | Admitting: Family Medicine

## 2015-09-19 ENCOUNTER — Ambulatory Visit: Payer: Medicaid Other | Attending: Family Medicine | Admitting: Family Medicine

## 2015-09-19 VITALS — BP 120/86 | HR 91 | Temp 98.3°F | Resp 20 | Ht 65.0 in | Wt 239.8 lb

## 2015-09-19 DIAGNOSIS — M93001 Unspecified slipped upper femoral epiphysis (nontraumatic), right hip: Secondary | ICD-10-CM

## 2015-09-19 DIAGNOSIS — Z9889 Other specified postprocedural states: Secondary | ICD-10-CM | POA: Insufficient documentation

## 2015-09-19 DIAGNOSIS — D509 Iron deficiency anemia, unspecified: Secondary | ICD-10-CM | POA: Diagnosis not present

## 2015-09-19 DIAGNOSIS — N921 Excessive and frequent menstruation with irregular cycle: Secondary | ICD-10-CM | POA: Insufficient documentation

## 2015-09-19 DIAGNOSIS — G43809 Other migraine, not intractable, without status migrainosus: Secondary | ICD-10-CM | POA: Diagnosis not present

## 2015-09-19 DIAGNOSIS — J4530 Mild persistent asthma, uncomplicated: Secondary | ICD-10-CM | POA: Insufficient documentation

## 2015-09-19 DIAGNOSIS — D251 Intramural leiomyoma of uterus: Secondary | ICD-10-CM

## 2015-09-19 DIAGNOSIS — G43909 Migraine, unspecified, not intractable, without status migrainosus: Secondary | ICD-10-CM | POA: Diagnosis not present

## 2015-09-19 DIAGNOSIS — I10 Essential (primary) hypertension: Secondary | ICD-10-CM | POA: Diagnosis not present

## 2015-09-19 DIAGNOSIS — D219 Benign neoplasm of connective and other soft tissue, unspecified: Secondary | ICD-10-CM | POA: Insufficient documentation

## 2015-09-19 DIAGNOSIS — F17210 Nicotine dependence, cigarettes, uncomplicated: Secondary | ICD-10-CM | POA: Insufficient documentation

## 2015-09-19 DIAGNOSIS — M546 Pain in thoracic spine: Secondary | ICD-10-CM

## 2015-09-19 DIAGNOSIS — M93851 Other specified osteochondropathies, right thigh: Secondary | ICD-10-CM | POA: Diagnosis not present

## 2015-09-19 LAB — CBC WITH DIFFERENTIAL/PLATELET
BASOS ABS: 0 {cells}/uL (ref 0–200)
Basophils Relative: 0 %
EOS ABS: 444 {cells}/uL (ref 15–500)
Eosinophils Relative: 4 %
HEMATOCRIT: 33.4 % — AB (ref 35.0–45.0)
HEMOGLOBIN: 9.8 g/dL — AB (ref 11.7–15.5)
LYMPHS ABS: 3108 {cells}/uL (ref 850–3900)
Lymphocytes Relative: 28 %
MCH: 19.2 pg — AB (ref 27.0–33.0)
MCHC: 29.3 g/dL — ABNORMAL LOW (ref 32.0–36.0)
MCV: 65.4 fL — AB (ref 80.0–100.0)
MPV: 9.4 fL (ref 7.5–12.5)
Monocytes Absolute: 555 cells/uL (ref 200–950)
Monocytes Relative: 5 %
NEUTROS ABS: 6993 {cells}/uL (ref 1500–7800)
NEUTROS PCT: 63 %
Platelets: 458 10*3/uL — ABNORMAL HIGH (ref 140–400)
RBC: 5.11 MIL/uL — ABNORMAL HIGH (ref 3.80–5.10)
RDW: 22.5 % — ABNORMAL HIGH (ref 11.0–15.0)
WBC: 11.1 10*3/uL — ABNORMAL HIGH (ref 3.8–10.8)

## 2015-09-19 LAB — GLUCOSE, POCT (MANUAL RESULT ENTRY): POC GLUCOSE: 79 mg/dL (ref 70–99)

## 2015-09-19 MED ORDER — TOPIRAMATE 50 MG PO TABS
50.0000 mg | ORAL_TABLET | Freq: Two times a day (BID) | ORAL | Status: DC
Start: 2015-09-19 — End: 2016-08-05

## 2015-09-19 NOTE — Progress Notes (Signed)
Headaches right hip dislocated Pain in lower to mid back

## 2015-09-19 NOTE — Progress Notes (Signed)
Subjective:    Patient ID: Kelly Byrd, female    DOB: [birth year], 40 y.o.   MRN: [id number]  HPI  She is a 40 year old female with a history of hypertension, asthma, migraine, anemia (secondary to menorrhagia from fibroids) who comes into the clinic for follow-up visit.  Commenced on Elavil, last office visit and reports no improvement in migraines which continue to occur daily with associated photophobia. 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 She is unable to bear much weight on her right leg on pain is disabling; does not really respond to tramadol and so she takes ibuprofen. She also has pain in her mid to lower back which does not radiate down her legs and she denies numbness.  She was recently approved for Medicaid and is ready to be referred to GYN for her menorrhagia; her periods are heavy and she has been taking her ferrous sulfate but denies dizziness or syncope.  Past Medical History  Diagnosis Date  . Asthma   . Hypertension   . Back pain     Past Surgical History  Procedure Laterality Date  . Cesarean section      2    Allergies  Allergen Reactions  . Ace Inhibitors Swelling  . Lisinopril   . Peanuts [Peanut Oil]     Social History   Social History  . Marital Status: Married    Spouse Name: N/A  . Number of Children: N/A  . Years of Education: N/A   Occupational History  . disabled    Social History Main Topics  . Smoking status: Current Every Day Smoker -- 0.25 packs/day    Types: Cigarettes  . Smokeless tobacco: Not on file  . Alcohol Use: 0.6 oz/week    1 Cans of beer per week  . Drug Use: No  . Sexual Activity: Yes   Other Topics Concern  . Not on file   Social History Narrative     Review of Systems  Constitutional: Negative for activity change, appetite change and fatigue.  HENT: Negative for congestion, sinus pressure and  sore throat.   Eyes: Negative for visual disturbance.  Respiratory: Negative for cough, chest tightness, shortness of breath and wheezing.   Cardiovascular: Negative for chest pain and palpitations.  Gastrointestinal: Negative for abdominal pain, constipation and abdominal distention.  Endocrine: Negative for polydipsia.  Genitourinary: Positive for menstrual problem. Negative for dysuria and frequency.  Musculoskeletal: Positive for back pain. Negative for arthralgias.       Right hip pain  Skin: Negative for rash.  Neurological: Negative for tremors, light-headedness and numbness.  Hematological: Does not bruise/bleed easily.  Psychiatric/Behavioral: Negative for behavioral problems and agitation.       Objective: Filed Vitals:   09/19/15 1517 09/19/15 1520  BP:  120/86  Pulse:  91  Temp:  98.3 F (36.8 C)  TempSrc:  Oral  Resp:  20  Height: 5\' 5"  (1.651 m)   Weight: 239 lb 12.8 oz (108.773 kg)   SpO2:  95%      Physical Exam Constitutional: She is oriented to person, place, and time.  Obese, acutely ill looking  Cardiovascular: Normal rate, normal heart sounds and intact distal pulses.   No murmur heard. Pulmonary/Chest: Effort normal and breath sounds normal. She has no wheezes. She has no rales. She exhibits no tenderness.  Abdominal: Soft. Bowel sounds are normal. She exhibits no  distension and no mass. There is no tenderness.  Musculoskeletal: She exhibits tenderness (tenderness on palpation of right thoracic back muscle)And on palpation of right hip and her range of motion.  Neurological: She is alert and oriented to person, place, and time.     CLINICAL DATA: Status post fall 10/25/2014. Abnormal plain film of the right hip 10/27/2014. Question avascular necrosis.  EXAM: MR OF THE RIGHT HIP WITHOUT CONTRAST  TECHNIQUE: Multiplanar, multisequence MR imaging was performed. No intravenous contrast was administered.  COMPARISON:  None.  FINDINGS: Bones: There is no avascular necrosis of the right or left femoral head. The patient has a remote healed slipped capital femoral epiphysis accounting for the appearance on plain films. No fracture or stress change is identified. The pelvis and visualized lower lumbar spine and femurs appear normal.  Articular cartilage and labrum  Articular cartilage: Appears preserved.  Labrum: The anterior, superior labrum appears degenerated without focal tear identified.  Joint or bursal effusion  Joint effusion: None.  Bursae: Unremarkable.  Muscles and tendons  Muscles and tendons: Intact and unremarkable in appearance.  Other findings  Miscellaneous: Imaged intrapelvic contents demonstrate fibroids in the uterus.  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 Negative for avascular necrosis.  Degenerated appearance of the anterior, superior labrum without discrete tear identified.  Uterine fibroids.   Electronically Signed  By: Inge Rise M.D.  On: 10/28/2014 15:46    Assessment & Plan:  1. Essential hypertension Controlled - amLODipine (NORVASC) 10 MG tablet; Take 1 tablet (10 mg total) by mouth daily.  Dispense: 30 tablet; Refill: 3  2. Anemia, iron deficiency Likely secondary to menorrhagia Continue ferrous sulfate CBC today   3. Menometrorrhagia Secondary to fibroids Refer to GYN  4. Other type of nonintractable migraine Uncontrolled on Elavil Placed on Topamax and she has been advised to increase from 50 mg to 100 mg twice daily if still symptomatic. We'll consider breakthrough medication at next visit if persisting.   5. Asthma, mild persistent, uncomplicated Controlled - montelukast (SINGULAIR) 10 MG tablet; Take 1 tablet (10 mg total) by mouth at bedtime.  Dispense: 30 tablet; Refill: 3 - mometasone-formoterol (DULERA) 200-5 MCG/ACT AERO; Inhale 2 puffs into the lungs 2  (two) times daily.  Dispense: 1 Inhaler; Refill: 3 - albuterol (PROVENTIL HFA;VENTOLIN HFA) 108 (90 Base) MCG/ACT inhaler; Inhale 2 puffs into the lungs every 6 (six) hours as needed for wheezing or shortness of breath (wheezing).  Dispense: 1 Inhaler; Refill: 3  6. Slipped Capital Femoral epiphysis Refer to Ortho - ketorolac (TORADOL) injection 60 mg; Inject 2 mLs (60 mg total) into the muscle once. - cyclobenzaprine (FLEXERIL) 10 MG tablet; Take 1 tablet (10 mg total) by mouth every 8 (eight) hours as needed for muscle spasms.  Dispense: 30 tablet; Refill: 0 - ibuprofen (ADVIL,MOTRIN) 600 MG tablet; Take 1 tablet (600 mg total) by mouth every 12 (twelve) hours as needed for mild pain or moderate pain.  Dispense: 60 tablet; Refill: 2   This note has been created with Surveyor, quantity. Any transcriptional errors are unintentional.

## 2015-09-19 NOTE — Patient Instructions (Signed)

## 2015-09-23 ENCOUNTER — Telehealth: Payer: Self-pay

## 2015-09-23 NOTE — Telephone Encounter (Signed)
-----   Message from Arnoldo Morale, MD sent at 09/22/2015  5:10 PM EDT ----- She is still anemic but this has improved compared to last set of blood work.

## 2015-09-23 NOTE — Telephone Encounter (Signed)
Writer spoke with patient regarding her lab results.  Patient states an understanding regarding the anemia.

## 2015-09-25 ENCOUNTER — Encounter: Payer: Self-pay | Admitting: Obstetrics & Gynecology

## 2015-10-03 ENCOUNTER — Encounter (INDEPENDENT_AMBULATORY_CARE_PROVIDER_SITE_OTHER): Payer: Self-pay

## 2015-10-03 ENCOUNTER — Encounter: Payer: Self-pay | Admitting: Family Medicine

## 2015-10-03 ENCOUNTER — Ambulatory Visit (INDEPENDENT_AMBULATORY_CARE_PROVIDER_SITE_OTHER): Payer: Medicaid Other | Admitting: Family Medicine

## 2015-10-03 VITALS — BP 145/79 | HR 84 | Ht 65.0 in | Wt 239.0 lb

## 2015-10-03 DIAGNOSIS — M93001 Unspecified slipped upper femoral epiphysis (nontraumatic), right hip: Secondary | ICD-10-CM | POA: Diagnosis not present

## 2015-10-07 ENCOUNTER — Encounter: Payer: Self-pay | Admitting: Family Medicine

## 2015-10-07 NOTE — Progress Notes (Signed)
Patient ID: Kelly Byrd, female   DOB: 23-Nov-1975, 40 y.o.   MRN: [id number]  Kelly Byrd - 40 y.o. female MRN [id number]  Date of birth: Aug 17, 1975    SUBJECTIVE:     Chief Complaint: Right hip pain  HPI: Several years of gradually worsening right hip pain. Was diagnosed with SCFE as a teenager. Was told she could not get it done about until she was an adult. Has been doing fairly well until last year or so when she has increasing pain with long bouts of ambulation. She also has some pain with sitting if she sits for long period of time. Pain is mostly in her right hip. Does not radiate past the buttock or thigh area. She's had no giving way of her leg. No lower extremity numbness or tingling. ROS:     Right hip pain. No unusual weight change, fever, sweats, chills. No numbness or tingling in her right lower extremity. No giving way of her right lower extremity.  PERTINENT  PMH / PSH FH / / SH:  Past Medical, Surgical, Social, and Family History Reviewed & Updated in the EMR.  Pertinent findings include:  SCFE. Asthma Hypertension Fibroids with menorrhagia and history of iron deficiency anemia Sickle cell trait ( per chart review) Current smoker one quarter pack per day History C-section  History of angioedema episode requiring overnight hospitalization in ICU, thought to be related to ACE inhibitor  IMAGING: MRI right hip August 2016, remote healed slipped capital femoral epiphysis right hip. Left hip appears normal. No apparent avascular necrosis of either femoral head.   OBJECTIVE: BP 145/79 mmHg  Pulse 84  Ht 5\' 5"  (1.651 m)  Wt 239 lb (108.41 kg)  BMI 39.77 kg/m2  Physical Exam:  Vital signs are reviewed. GEN.: Well-developed female no acute distress HIPS: Right hip has surprisingly good internal and external rotation. She has a leg length discrepancy of about 4 cm. Her gait is abnormal. Her flexion is limited to about 90 with her extension to 30.  ASSESSMENT &  PLAN:  See problem based charting & AVS for pt instructions.

## 2015-10-07 NOTE — Assessment & Plan Note (Signed)
I will refer her to orthopedics for further evaluation and management of herSCFE

## 2015-10-23 ENCOUNTER — Telehealth: Payer: Self-pay | Admitting: Family Medicine

## 2015-10-23 NOTE — Telephone Encounter (Signed)
Pt called stating she is having extreme back pain Pt states the medication prescribed, cyclobenzaprine (FLEXERIL) 10 MG tablet [id number] and ibuprofen (ADVIL,MOTRIN) 600 MG tablet [id number] is not working  Pt would like to know if a different medication can be prescribed and to send to the [facility] in [place], [place] states she is on the verge of going to the ED due to the pain  Please assist, thank you

## 2015-10-23 NOTE — Telephone Encounter (Signed)
Writer called patient back who is requesting a stronger "pain medication".  Writer stated that MD would need to see her to reassess the situation and also patient has not had any scans completed since last year-09/2014 Patient requesting an appt for Monday 10/27/15.  Writer will check with MD to see what she would like to do.    Waiting for instructions from Dr. Jarold Song.  Patient to call back tomorrow.

## 2015-10-24 NOTE — Telephone Encounter (Signed)
I had referred her to orthopedics at her last office visit and she needs to follow-up with them. She needs an office visit if current regimen is not working.

## 2015-10-27 ENCOUNTER — Encounter: Payer: Medicaid Other | Admitting: Obstetrics and Gynecology

## 2015-10-28 NOTE — Telephone Encounter (Signed)
Writer called patient regarding either seeing orthopedics or setting up an appt with Dr. Jarold Song, patient states she will call back with her decision- she feels that ortho did not treat her "right".

## 2015-11-05 ENCOUNTER — Telehealth: Payer: Self-pay | Admitting: Family Medicine

## 2015-11-05 DIAGNOSIS — M93001 Unspecified slipped upper femoral epiphysis (nontraumatic), right hip: Secondary | ICD-10-CM

## 2015-11-05 NOTE — Telephone Encounter (Signed)
Pt. Called stating that she was referred to orthopedic surgery and she went to an appointment. Pt. Is now stating that she needs a new referral to the same place so that she can be able to get injections.  Please f/u

## 2015-11-06 NOTE — Telephone Encounter (Signed)
Good Afternoon  Dr Jarold Song  I spoke to Kelly Byrd she need a new referral to [facility] for injection  Right Hip  Thank you

## 2015-11-06 NOTE — Telephone Encounter (Signed)
Could you please look into this and let me know what exactly needs to be done and the name of the practice? Thank you.

## 2015-11-07 NOTE — Telephone Encounter (Signed)
Done

## 2015-11-26 ENCOUNTER — Other Ambulatory Visit (HOSPITAL_COMMUNITY)
Admission: RE | Admit: 2015-11-26 | Discharge: 2015-11-26 | Disposition: A | Discharge status: Home / Self Care | Payer: Medicaid Other | Source: Ambulatory Visit | Attending: Obstetrics & Gynecology | Admitting: Obstetrics & Gynecology

## 2015-11-26 ENCOUNTER — Encounter: Payer: Self-pay | Admitting: *Deleted

## 2015-11-26 ENCOUNTER — Ambulatory Visit (INDEPENDENT_AMBULATORY_CARE_PROVIDER_SITE_OTHER): Payer: Medicaid Other | Admitting: Obstetrics & Gynecology

## 2015-11-26 ENCOUNTER — Encounter: Payer: Self-pay | Admitting: Obstetrics & Gynecology

## 2015-11-26 VITALS — BP 142/82 | HR 80 | Ht 64.0 in | Wt 237.0 lb

## 2015-11-26 DIAGNOSIS — Z803 Family history of malignant neoplasm of breast: Secondary | ICD-10-CM | POA: Diagnosis not present

## 2015-11-26 DIAGNOSIS — N920 Excessive and frequent menstruation with regular cycle: Secondary | ICD-10-CM

## 2015-11-26 DIAGNOSIS — Z1151 Encounter for screening for human papillomavirus (HPV): Secondary | ICD-10-CM | POA: Insufficient documentation

## 2015-11-26 DIAGNOSIS — Z01419 Encounter for gynecological examination (general) (routine) without abnormal findings: Secondary | ICD-10-CM | POA: Insufficient documentation

## 2015-11-26 DIAGNOSIS — Z124 Encounter for screening for malignant neoplasm of cervix: Secondary | ICD-10-CM | POA: Diagnosis not present

## 2015-11-26 NOTE — Progress Notes (Signed)
Patient ID: Kelly Byrd, female   DOB: 1975/04/28, 40 y.o.   MRN: [id number]  Chief Complaint  Patient presents with  . Gynecologic Exam  heavy painful menses  HPI Kelly Byrd is a 40 y.o. female.  [id number] Patient's last menstrual period was 10/21/2015 (approximate). Several years with heavy regular menses and dysmenorrhea which is disabling.  HPI  Past Medical History:  Diagnosis Date  . Asthma   . Back pain   . GERD (gastroesophageal reflux disease)   . Hypertension   . Kidney stone   . Migraines   . Sickle cell trait [facility])   h/o ICU care for status asthmaticus and transfusion for anemia, [facility] 11/2013 Probable fibroid uterus seen on pelvic CT Past Surgical History:  Procedure Laterality Date  . CESAREAN SECTION     2    Family History  Problem Relation Age of Onset  . Hypertension Mother   . Hypertension Father   . Diabetes Mellitus II Maternal Grandmother   . Lupus      Social History Social History  Substance Use Topics  . Smoking status: Current Every Day Smoker    Packs/day: 0.25    Types: Cigarettes  . Smokeless tobacco: Never Used  . Alcohol use 0.6 oz/week    1 Cans of beer per week    Allergies  Allergen Reactions  . Ace Inhibitors Swelling  . Lisinopril   . Peanuts [Peanut Oil]     Current Outpatient Prescriptions  Medication Sig Dispense Refill  . albuterol (PROVENTIL HFA;VENTOLIN HFA) 108 (90 Base) MCG/ACT inhaler Inhale 2 puffs into the lungs every 6 (six) hours as needed for wheezing or shortness of breath (wheezing). 1 Inhaler 3  . amLODipine (NORVASC) 10 MG tablet Take 1 tablet (10 mg total) by mouth daily. 30 tablet 3  . diphenhydrAMINE (BENADRYL) 25 mg capsule Take 1 capsule (25 mg total) by mouth every 8 (eight) hours. 10 capsule 0  . famotidine (PEPCID) 20 MG tablet Take 1 tablet (20 mg total) by mouth 2 (two) times daily. 6 tablet 0  . ferrous sulfate 325 (65 FE) MG tablet Take 1 tablet (325 mg  total) by mouth 3 (three) times daily with meals. 90 tablet 3  . ibuprofen (ADVIL,MOTRIN) 600 MG tablet Take 1 tablet (600 mg total) by mouth every 12 (twelve) hours as needed for mild pain or moderate pain. 60 tablet 2  . meloxicam (MOBIC) 15 MG tablet Take 15 mg by mouth daily.    . mometasone-formoterol (DULERA) 200-5 MCG/ACT AERO Inhale 2 puffs into the lungs 2 (two) times daily. 1 Inhaler 3  . montelukast (SINGULAIR) 10 MG tablet Take 1 tablet (10 mg total) by mouth at bedtime. 30 tablet 3  . topiramate (TOPAMAX) 50 MG tablet Take 1 tablet (50 mg total) by mouth 2 (two) times daily. 60 tablet 3   No current facility-administered medications for this visit.     Review of Systems Review of Systems  Constitutional: Negative.   Respiratory: Negative.   Gastrointestinal: Negative.   Genitourinary: Positive for menstrual problem and pelvic pain. Negative for vaginal bleeding and vaginal discharge.  Musculoskeletal: Positive for arthralgias (hip pain).    Blood pressure (!) 142/82, pulse 80, height 5\' 4"  (1.626 m), weight 237 lb (107.5 kg), last menstrual period 10/21/2015.  Physical Exam Physical Exam  Constitutional: She is oriented to person, place, and time. She appears well-developed. No distress.  Neck: Normal range of motion.  Cardiovascular: Normal rate.   Pulmonary/Chest: Effort  normal. No respiratory distress.  Abdominal: Soft. She exhibits no distension and no mass. There is no tenderness.  Low transverse and vertical midline scars  Genitourinary: Vagina normal. No vaginal discharge found.  Genitourinary Comments: Uterus is posterior, difficult to palpate fundus but fullness posterior c/w possible enlargement  Neurological: She is alert and oriented to person, place, and time.  Skin: Skin is warm and dry.  Psychiatric: She has a normal mood and affect. Her behavior is normal.    Data Reviewed CT results Records from 9/15 hospitalization [facility]  Assessment     Menorrhagia and dysmenorrhea Fibroid uterus H/O anemia Patient Active Problem List   Diagnosis Date Noted  . Fibroids 09/19/2015  . Hypertension 08/29/2015  . Asthma 08/29/2015  . Back pain 08/29/2015  . 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 Anemia, iron deficiency 10/29/2014  . Menometrorrhagia 10/29/2014  . Migraine 10/27/2014       Plan    Pelvic US Continue ibuprofen for pain RTC to assess management, possibly hysterectomy   Pap sent today    Kelly Byrd 11/26/2015, 9:32 AM

## 2015-11-27 LAB — CYTOLOGY - PAP

## 2015-12-04 ENCOUNTER — Ambulatory Visit
Admission: RE | Admit: 2015-12-04 | Discharge: 2015-12-04 | Disposition: A | Discharge status: Home / Self Care | Payer: Medicaid Other | Source: Ambulatory Visit | Attending: Obstetrics & Gynecology | Admitting: Obstetrics & Gynecology

## 2015-12-04 DIAGNOSIS — D250 Submucous leiomyoma of uterus: Secondary | ICD-10-CM | POA: Diagnosis not present

## 2015-12-04 DIAGNOSIS — N920 Excessive and frequent menstruation with regular cycle: Secondary | ICD-10-CM | POA: Diagnosis present

## 2015-12-15 ENCOUNTER — Ambulatory Visit: Payer: Medicaid Other | Admitting: Obstetrics and Gynecology

## 2015-12-15 ENCOUNTER — Encounter: Payer: Self-pay | Admitting: Obstetrics and Gynecology

## 2015-12-15 NOTE — Progress Notes (Signed)
Patient did not show for 12/15/2015 GYN visit  Durene Romans MD Attending [facility] for [place] [employer])

## 2016-01-05 ENCOUNTER — Ambulatory Visit: Payer: Medicaid Other | Admitting: Family Medicine

## 2016-01-19 ENCOUNTER — Encounter: Payer: Self-pay | Admitting: *Deleted

## 2016-02-09 ENCOUNTER — Emergency Department
Admission: EM | Admit: 2016-02-09 | Discharge: 2016-02-09 | Disposition: A | Discharge status: Home / Self Care | Payer: Medicaid Other

## 2016-03-26 ENCOUNTER — Encounter (HOSPITAL_COMMUNITY): Payer: Self-pay | Admitting: Emergency Medicine

## 2016-03-26 ENCOUNTER — Emergency Department (HOSPITAL_COMMUNITY): Payer: Medicaid Other

## 2016-03-26 ENCOUNTER — Emergency Department (HOSPITAL_COMMUNITY)
Admission: EM | Admit: 2016-03-26 | Discharge: 2016-03-26 | Disposition: A | Discharge status: Home / Self Care | Payer: Medicaid Other | Attending: Emergency Medicine | Admitting: Emergency Medicine

## 2016-03-26 ENCOUNTER — Emergency Department (HOSPITAL_COMMUNITY)
Admission: EM | Admit: 2016-03-26 | Discharge: 2016-03-26 | Disposition: A | Discharge status: Home / Self Care | Payer: Medicaid Other | Source: Home / Self Care

## 2016-03-26 DIAGNOSIS — M791 Myalgia, unspecified site: Secondary | ICD-10-CM

## 2016-03-26 DIAGNOSIS — Z79899 Other long term (current) drug therapy: Secondary | ICD-10-CM | POA: Diagnosis not present

## 2016-03-26 DIAGNOSIS — Z91010 Allergy to peanuts: Secondary | ICD-10-CM | POA: Insufficient documentation

## 2016-03-26 DIAGNOSIS — N938 Other specified abnormal uterine and vaginal bleeding: Secondary | ICD-10-CM | POA: Insufficient documentation

## 2016-03-26 DIAGNOSIS — F17210 Nicotine dependence, cigarettes, uncomplicated: Secondary | ICD-10-CM | POA: Diagnosis not present

## 2016-03-26 DIAGNOSIS — I10 Essential (primary) hypertension: Secondary | ICD-10-CM | POA: Diagnosis not present

## 2016-03-26 DIAGNOSIS — R102 Pelvic and perineal pain: Secondary | ICD-10-CM | POA: Diagnosis not present

## 2016-03-26 DIAGNOSIS — M545 Low back pain: Secondary | ICD-10-CM | POA: Diagnosis present

## 2016-03-26 DIAGNOSIS — J45909 Unspecified asthma, uncomplicated: Secondary | ICD-10-CM | POA: Insufficient documentation

## 2016-03-26 DIAGNOSIS — Z5321 Procedure and treatment not carried out due to patient leaving prior to being seen by health care provider: Secondary | ICD-10-CM | POA: Insufficient documentation

## 2016-03-26 LAB — COMPREHENSIVE METABOLIC PANEL
ALBUMIN: 3.8 g/dL (ref 3.5–5.0)
ALK PHOS: 59 U/L (ref 38–126)
ALT: 14 U/L (ref 14–54)
ALT: 14 U/L (ref 14–54)
ANION GAP: 9 (ref 5–15)
ANION GAP: 9 (ref 5–15)
AST: 20 U/L (ref 15–41)
AST: 19 U/L (ref 15–41)
Albumin: 3.8 g/dL (ref 3.5–5.0)
Alkaline Phosphatase: 61 U/L (ref 38–126)
BILIRUBIN TOTAL: 0.5 mg/dL (ref 0.3–1.2)
BILIRUBIN TOTAL: 0.4 mg/dL (ref 0.3–1.2)
BUN: 10 mg/dL (ref 6–20)
BUN: 13 mg/dL (ref 6–20)
CALCIUM: 9 mg/dL (ref 8.9–10.3)
CHLORIDE: 102 mmol/L (ref 101–111)
CO2: 24 mmol/L (ref 22–32)
CO2: 23 mmol/L (ref 22–32)
CREATININE: 0.8 mg/dL (ref 0.44–1.00)
Calcium: 9.3 mg/dL (ref 8.9–10.3)
Chloride: 105 mmol/L (ref 101–111)
Creatinine, Ser: 0.7 mg/dL (ref 0.44–1.00)
GFR calc Af Amer: >60 mL/min (ref >60)
GFR calc non Af Amer: >60 mL/min (ref >60)
GLUCOSE: 117 mg/dL — AB (ref 65–99)
Glucose, Bld: 113 mg/dL — ABNORMAL HIGH (ref 65–99)
POTASSIUM: 3.9 mmol/L (ref 3.5–5.1)
Potassium: 3.6 mmol/L (ref 3.5–5.1)
SODIUM: 137 mmol/L (ref 135–145)
Sodium: 135 mmol/L (ref 135–145)
TOTAL PROTEIN: 7.5 g/dL (ref 6.5–8.1)
TOTAL PROTEIN: 7.6 g/dL (ref 6.5–8.1)

## 2016-03-26 LAB — GC/CHLAMYDIA PROBE AMP (~~LOC~~) NOT AT ARMC
Chlamydia: NEGATIVE
NEISSERIA GONORRHEA: NEGATIVE

## 2016-03-26 LAB — CBC WITH DIFFERENTIAL/PLATELET
BASOS ABS: 0 10*3/uL (ref 0.0–0.1)
BASOS PCT: 0 %
BASOS PCT: 0 %
Basophils Absolute: 0 10*3/uL (ref 0.0–0.1)
EOS ABS: 0.6 10*3/uL (ref 0.0–0.7)
EOS PCT: 5 %
Eosinophils Absolute: 0.5 10*3/uL (ref 0.0–0.7)
Eosinophils Relative: 6 %
HEMATOCRIT: 36.8 % (ref 36.0–46.0)
HEMATOCRIT: 35.9 % — AB (ref 36.0–46.0)
HEMOGLOBIN: 11.1 g/dL — AB (ref 12.0–15.0)
Hemoglobin: 11.7 g/dL — ABNORMAL LOW (ref 12.0–15.0)
LYMPHS ABS: 2.5 10*3/uL (ref 0.7–4.0)
LYMPHS PCT: 22 %
Lymphocytes Relative: 26 %
Lymphs Abs: 2.2 10*3/uL (ref 0.7–4.0)
MCH: 20.4 pg — AB (ref 26.0–34.0)
MCH: 19.9 pg — ABNORMAL LOW (ref 26.0–34.0)
MCHC: 31.8 g/dL (ref 30.0–36.0)
MCHC: 30.9 g/dL (ref 30.0–36.0)
MCV: 64.2 fL — AB (ref 78.0–100.0)
MCV: 64.2 fL — ABNORMAL LOW (ref 78.0–100.0)
MONO ABS: 0.5 10*3/uL (ref 0.1–1.0)
MONOS PCT: 5 %
Monocytes Absolute: 0.5 10*3/uL (ref 0.1–1.0)
Monocytes Relative: 5 %
NEUTROS ABS: 6.1 10*3/uL (ref 1.7–7.7)
NEUTROS ABS: 6.7 10*3/uL (ref 1.7–7.7)
NEUTROS PCT: 67 %
Neutrophils Relative %: 64 %
PLATELETS: 376 10*3/uL (ref 150–400)
Platelets: 347 10*3/uL (ref 150–400)
RBC: 5.73 MIL/uL — ABNORMAL HIGH (ref 3.87–5.11)
RBC: 5.59 MIL/uL — ABNORMAL HIGH (ref 3.87–5.11)
RDW: 17.7 % — AB (ref 11.5–15.5)
RDW: 17.8 % — ABNORMAL HIGH (ref 11.5–15.5)
WBC: 9.6 10*3/uL (ref 4.0–10.5)
WBC: 10 10*3/uL (ref 4.0–10.5)

## 2016-03-26 LAB — URINALYSIS, ROUTINE W REFLEX MICROSCOPIC
BILIRUBIN URINE: NEGATIVE
Glucose, UA: NEGATIVE mg/dL
Ketones, ur: NEGATIVE mg/dL
Leukocytes, UA: NEGATIVE
Nitrite: NEGATIVE
PH: 5 (ref 5.0–8.0)
Protein, ur: 100 mg/dL — AB
SPECIFIC GRAVITY, URINE: 1.017 (ref 1.005–1.030)

## 2016-03-26 LAB — I-STAT BETA HCG BLOOD, ED (MC, WL, AP ONLY)

## 2016-03-26 LAB — WET PREP, GENITAL
Sperm: NONE SEEN
Trich, Wet Prep: NONE SEEN
YEAST WET PREP: NONE SEEN

## 2016-03-26 LAB — CK: CK TOTAL: 93 U/L (ref 38–234)

## 2016-03-26 LAB — POC URINE PREG, ED: PREG TEST UR: NEGATIVE

## 2016-03-26 MED ORDER — MORPHINE SULFATE (PF) 4 MG/ML IV SOLN
4.0000 mg | Freq: Once | INTRAVENOUS | Status: AC
  Administered 2016-03-26: 4 mg via INTRAVENOUS
  Filled 2016-03-26: qty 1

## 2016-03-26 MED ORDER — MORPHINE SULFATE (PF) 4 MG/ML IV SOLN
8.0000 mg | Freq: Once | INTRAVENOUS | Status: AC
  Administered 2016-03-26: 8 mg via INTRAVENOUS
  Filled 2016-03-26: qty 2

## 2016-03-26 MED ORDER — HYDROCODONE-ACETAMINOPHEN 5-325 MG PO TABS: 1.0000 | ORAL_TABLET | Freq: Four times a day (QID) | ORAL | 0 refills | Status: DC | PRN

## 2016-03-26 MED ORDER — HYDROCODONE-ACETAMINOPHEN 5-325 MG PO TABS
1.0000 | ORAL_TABLET | Freq: Once | ORAL | Status: AC
  Administered 2016-03-26: 1 via ORAL
  Filled 2016-03-26: qty 1

## 2016-03-26 MED ORDER — MORPHINE SULFATE (PF) 4 MG/ML IV SOLN
8.0000 mg | Freq: Once | INTRAVENOUS | Status: DC
Start: 2016-03-26 — End: 2016-03-26
  Filled 2016-03-26: qty 2

## 2016-03-26 NOTE — ED Notes (Signed)
Patient was educated not to drive, operate heavy machinery, or drink alcohol while taking narcotic medication.  

## 2016-03-26 NOTE — ED Notes (Signed)
Pelvic cart at bedside. 

## 2016-03-26 NOTE — ED Triage Notes (Signed)
Pt is c/o lower abd pain, lower back pain, leg pain , and headache  Pt states her sxs started 3 days ago  Pt states her legs have been swollen along with her feet and she has been using muscle rub, ibuprofen 600mg  and aleve without relief  Pt denies fever  Pt also c/o shooting pain in her left side  Pt states she also had a bump in her perineal area that has popped and now it is sore and painful  Pt states she started spotting today but her regular cycle ended December 15th

## 2016-03-26 NOTE — ED Notes (Signed)
Pt unable to void at this time. 

## 2016-03-26 NOTE — ED Triage Notes (Signed)
Patient reports generalized body aches /lower back pain onset 2 days ago , denies injury , no urinary discomfort , no fever or chills .

## 2016-03-26 NOTE — ED Provider Notes (Addendum)
[place] DEPT Provider Note   CSN: [id number] Arrival date & time: 03/26/16  [id number]  By signing my name below, I, Judithe Modest, attest that this documentation has been prepared under the direction and in the presence of Varney Biles, MD. Electronically Signed: Judithe Modest, ER Scribe. 11/08/2015. 3:56 AM.  History   Chief Complaint Chief Complaint  Patient presents with  . multiple complaints   The history is provided by the patient. No language interpreter was used.   HPI Comments: Kelly Byrd is a 40 y.o. female who presents to the Emergency Department complaining of severe aching pain in her lower back, lower abdomen, legs as well as a HA. Her pain started in her legs two days ago and progressed into her hips and abdomen. She is also complaining of several days of bilateral leg swelling and mild intermittent vaginal bleeding that started today. Her periods are usually very heavy and her current spotting is atypical for her. She has been taking 600mg  ibuprofen and has been using muscle rubs on her legs with minimal relief. She has a PMHx of HTN but no hx of HLD. She denies dysuria, urinary frequency or urgency. She endorses bladder pressure when she urinates. Her LNMP was December 15th. She has a PMHx of fibroids. She denies rhinorrhea, sore throat, cough or other cold sx. She has a past surgical hx of c-section.  Pt has no midline back pain, and has no associated numbness, weakness, urinary incontinence, urinary retention, bowel incontinence, saddle anesthsia.    Past Medical History:  Diagnosis Date  . Asthma   . Back pain   . GERD (gastroesophageal reflux disease)   . Hypertension   . Kidney stone   . Migraines   . Sickle cell trait [facility])     Patient Active Problem List   Diagnosis Date Noted  . Fibroids 09/19/2015  . Hypertension 08/29/2015  . Asthma 08/29/2015  . Back pain 08/29/2015  . 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 Anemia, iron  deficiency 10/29/2014  . Menometrorrhagia 10/29/2014  . Migraine 10/27/2014    Past Surgical History:  Procedure Laterality Date  . CESAREAN SECTION     2    OB History    Gravida Para Term Preterm AB Living   3 3 3     3    SAB TAB Ectopic Multiple Live Births           3       Home Medications    Prior to Admission medications   Medication Sig Start Date End Date Taking? Authorizing Provider  albuterol (PROVENTIL HFA;VENTOLIN HFA) 108 (90 Base) MCG/ACT inhaler Inhale 2 puffs into the lungs every 6 (six) hours as needed for wheezing or shortness of breath (wheezing). 08/29/15  Yes Arnoldo Morale, MD  amLODipine (NORVASC) 10 MG tablet Take 1 tablet (10 mg total) by mouth daily. 08/29/15  Yes Arnoldo Morale, MD  famotidine (PEPCID) 20 MG tablet Take 1 tablet (20 mg total) by mouth 2 (two) times daily. 06/16/15  Yes Fritzi Mandes, MD  ferrous sulfate 325 (65 FE) MG tablet Take 1 tablet (325 mg total) by mouth 3 (three) times daily with meals. 10/29/14  Yes Aldean Jewett, MD  ibuprofen (ADVIL,MOTRIN) 600 MG tablet Take 1 tablet (600 mg total) by mouth every 12 (twelve) hours as needed for mild pain or moderate pain. 08/29/15  Yes Arnoldo Morale, MD  meloxicam (MOBIC) 15 MG tablet Take 15 mg by mouth daily.   Yes Historical  Provider, MD  mometasone-formoterol (DULERA) 200-5 MCG/ACT AERO Inhale 2 puffs into the lungs 2 (two) times daily. 08/29/15  Yes Arnoldo Morale, MD  montelukast (SINGULAIR) 10 MG tablet Take 1 tablet (10 mg total) by mouth at bedtime. 08/29/15  Yes Arnoldo Morale, MD  topiramate (TOPAMAX) 50 MG tablet Take 1 tablet (50 mg total) by mouth 2 (two) times daily. 09/19/15  Yes Arnoldo Morale, MD  diphenhydrAMINE (BENADRYL) 25 mg capsule Take 1 capsule (25 mg total) by mouth every 8 (eight) hours. Patient not taking: Reported on 03/26/2016 06/16/15   Fritzi Mandes, MD    Family History Family History  Problem Relation Age of Onset  . Hypertension Mother   . Hypertension Father   . Diabetes  Mellitus II Maternal Grandmother   . Lupus      Social History Social History  Substance Use Topics  . Smoking status: Current Every Day Smoker    Packs/day: 0.25    Types: Cigarettes  . Smokeless tobacco: Never Used  . Alcohol use 0.6 oz/week    1 Cans of beer per week     Allergies   Ace inhibitors; Lisinopril; and Peanuts [peanut oil]   Review of Systems Review of Systems A complete 10 system review of systems was obtained and all systems are negative except as noted in the HPI and PMH.   Physical Exam Updated Vital Signs BP 147/95 (BP Location: Left Arm)   Pulse 83   Temp 98.1 F (36.7 C) (Oral)   Resp 18   LMP 03/07/2016 (Approximate)   SpO2 97%   Physical Exam  Constitutional: She is oriented to person, place, and time. She appears well-developed and well-nourished. No distress.  HENT:  Head: Normocephalic and atraumatic.  Eyes: Conjunctivae and EOM are normal. Pupils are equal, round, and reactive to light.  Neck: Normal range of motion. Neck supple.  Cardiovascular: Normal rate, regular rhythm, normal heart sounds and intact distal pulses.   No murmur heard. Pulmonary/Chest: Effort normal. No respiratory distress. She has no wheezes.  Abdominal: Soft. Bowel sounds are normal. She exhibits no distension. There is tenderness. There is no rebound and no guarding.  TTP over lower abdominal quadrants bilaterally.   Genitourinary: Vagina normal and uterus normal.  Genitourinary Comments: External exam - normal, no lesions Speculum exam: Pt has some white discharge, + moderate amount of blood Bimanual exam: Patient has no CMT, + adnexal tenderness and fullness and cervical os is closed  Musculoskeletal: Normal range of motion.  Bilateral flank pain and proximal diffuse lower extremity TTP.   Neurological: She is alert and oriented to person, place, and time. Coordination normal.  Skin: Skin is warm and dry. She is not diaphoretic.  Psychiatric: She has a normal  mood and affect. Her behavior is normal.  Nursing note and vitals reviewed.   ED Treatments / Results  DIAGNOSTIC STUDIES: Oxygen Saturation is 100% on RA, normal by my interpretation.    COORDINATION OF CARE: 4:04 AM Discussed treatment plan with pt at bedside and pt agreed to plan.  Labs (all labs ordered are listed, but only abnormal results are displayed) Labs Reviewed  WET PREP, GENITAL - Abnormal; Notable for the following:       Result Value   Clue Cells Wet Prep HPF POC PRESENT (*)    WBC, Wet Prep HPF POC FEW (*)    All other components within normal limits  CBC WITH DIFFERENTIAL/PLATELET - Abnormal; Notable for the following:    RBC 5.59 (*)  Hemoglobin 11.1 (*)    HCT 35.9 (*)    MCV 64.2 (*)    MCH 19.9 (*)    RDW 17.8 (*)    All other components within normal limits  COMPREHENSIVE METABOLIC PANEL - Abnormal; Notable for the following:    Glucose, Bld 117 (*)    All other components within normal limits  URINALYSIS, ROUTINE W REFLEX MICROSCOPIC - Abnormal; Notable for the following:    APPearance HAZY (*)    Hgb urine dipstick LARGE (*)    Protein, ur 100 (*)    Bacteria, UA RARE (*)    Squamous Epithelial / LPF 0-5 (*)    All other components within normal limits  CK  POC URINE PREG, ED  GC/CHLAMYDIA PROBE AMP ([place]) NOT AT [facility]    EKG  EKG Interpretation None       Radiology US Transvaginal Non-ob  Result Date: 03/26/2016 CLINICAL DATA:  Vaginal bleeding and lower abdominal pain. EXAM: TRANSABDOMINAL AND TRANSVAGINAL ULTRASOUND OF PELVIS TECHNIQUE: Both transabdominal and transvaginal ultrasound examinations of the pelvis were performed. Transabdominal technique was performed for global imaging of the pelvis including uterus, ovaries, adnexal regions, and pelvic cul-de-sac. It was necessary to proceed with endovaginal exam following the transabdominal exam to visualize the uterus, ovaries and adnexa. COMPARISON:  Pelvic ultrasound 12/04/2015  FINDINGS: Uterus Measurements: 11.7 x 7.9 x 8.4 cm. Myometrial echotexture is heterogeneous Anterior fibroid measures 7.8 x 7.6 x 6.4 cm, previously 4.7 x 4.5 x 4.5 cm. Endometrium Thickness: 10.5 mm.  No focal abnormality visualized. Right ovary Not visualized.  No evidence of adnexal mass. Left ovary Measurements: 4.9 x 4.3 x 3.2 cm. The ovary was only seen transabdominally. Probable cyst measuring 2.8 cm. Ovarian blood flow is noted. Other findings No abnormal free fluid.  Small amount of free fluid is physiologic. IMPRESSION: 1. Uterine fibroid has increased in size from prior exam current maximal dimension 7.8 cm, previously 4.7 cm. Normal endometrial thickness. 2. Probable 2.8 cm cyst in the left ovary. Right ovary was not visualized due to patient discomfort. Electronically Signed   By: Jeb Levering M.D.   On: 03/26/2016 06:27   US Pelvis Complete  Result Date: 03/26/2016 CLINICAL DATA:  Vaginal bleeding and lower abdominal pain. EXAM: TRANSABDOMINAL AND TRANSVAGINAL ULTRASOUND OF PELVIS TECHNIQUE: Both transabdominal and transvaginal ultrasound examinations of the pelvis were performed. Transabdominal technique was performed for global imaging of the pelvis including uterus, ovaries, adnexal regions, and pelvic cul-de-sac. It was necessary to proceed with endovaginal exam following the transabdominal exam to visualize the uterus, ovaries and adnexa. COMPARISON:  Pelvic ultrasound 12/04/2015 FINDINGS: Uterus Measurements: 11.7 x 7.9 x 8.4 cm. Myometrial echotexture is heterogeneous Anterior fibroid measures 7.8 x 7.6 x 6.4 cm, previously 4.7 x 4.5 x 4.5 cm. Endometrium Thickness: 10.5 mm.  No focal abnormality visualized. Right ovary Not visualized.  No evidence of adnexal mass. Left ovary Measurements: 4.9 x 4.3 x 3.2 cm. The ovary was only seen transabdominally. Probable cyst measuring 2.8 cm. Ovarian blood flow is noted. Other findings No abnormal free fluid.  Small amount of free fluid is  physiologic. IMPRESSION: 1. Uterine fibroid has increased in size from prior exam current maximal dimension 7.8 cm, previously 4.7 cm. Normal endometrial thickness. 2. Probable 2.8 cm cyst in the left ovary. Right ovary was not visualized due to patient discomfort. Electronically Signed   By: Jeb Levering M.D.   On: 03/26/2016 06:27    Procedures Procedures (including critical care time)  Medications Ordered in ED Medications  HYDROcodone-acetaminophen (NORCO/VICODIN) 5-325 MG per tablet 1 tablet (not administered)  morphine 4 MG/ML injection 4 mg (not administered)  morphine 4 MG/ML injection 8 mg (8 mg Intravenous Given 03/26/16 0457)     Initial Impression / Assessment and Plan / ED Course  I have reviewed the triage vital signs and the nursing notes.  Pertinent labs & imaging results that were available during my care of the patient were reviewed by me and considered in my medical decision making (see chart for details).  Clinical Course as of Mar 26 706  Fri Mar 26, 2016  0703 [place] results reviewed. Results from the ER workup discussed with the patient face to face and all questions answered to the best of my ability. Pt's pain is bilateral lower quadrants, bilateral flank region. Pain is not acute. R side ovary was not visualized due to patient discomfort, but we I dont think she has torsion clinically. Pt has a gynecologist - so we have asked her to see them. I dont think CT is needed - as the etiology appears to be GU and not GI at this time (based on abd exam and pelvic exam). Her pain is moderately severe - so we will give her 6-9 norco. Strict ER return precautions have been discussed, and patient is agreeing with the plan and is comfortable with the workup done and the recommendations from the ER.  US Transvaginal Non-OB [AN]    Clinical Course User Index [AN] Varney Biles, MD    Pt comes in with cc of pelvic pain, back pain, bilateral thigh pain, malaise and  headaches. Symptoms are somewhat vague. She does have some urinary discomfort - we will screen for UTI. Pt's pelvic exam reveals active vaginal bleeding and adnexal fullness. We will get [place] to r/o large ovarian cyst//tumor. Hemorrhagic cyst is quite possible. If the [place] is neg - we will consider CT to look into diverticulitis / renal stones etc.  No clinical concerns for cord compression.   Final Clinical Impressions(s) / ED Diagnoses   Final diagnoses:  DUB (dysfunctional uterine bleeding)  Myalgia    New Prescriptions New Prescriptions   No medications on file     I personally performed the services described in this documentation, which was scribed in my presence. The recorded information has been reviewed and is accurate.        Varney Biles, MD 03/26/16 [id number]    Varney Biles, MD 03/26/16 [phone]

## 2016-03-26 NOTE — Discharge Instructions (Signed)
°  All the results in the ER are normal, labs and imaging. We are not sure what is causing your symptoms - but it seems to be a pelvic cause - and we think a gynecologist is the best doctor to follow up. The workup in the ER is not complete, and is limited to screening for life threatening and emergent conditions only, so please see a primary care doctor and gynecologist for further evaluation.

## 2016-04-15 ENCOUNTER — Institutional Professional Consult (permissible substitution): Payer: Medicaid Other | Admitting: Obstetrics and Gynecology

## 2016-06-21 ENCOUNTER — Other Ambulatory Visit: Payer: Self-pay | Admitting: Family Medicine

## 2016-06-21 DIAGNOSIS — I10 Essential (primary) hypertension: Secondary | ICD-10-CM

## 2016-08-02 ENCOUNTER — Other Ambulatory Visit: Payer: Self-pay | Admitting: Family Medicine

## 2016-08-02 DIAGNOSIS — I10 Essential (primary) hypertension: Secondary | ICD-10-CM

## 2016-08-04 ENCOUNTER — Telehealth: Payer: Self-pay | Admitting: Family Medicine

## 2016-08-04 NOTE — Telephone Encounter (Signed)
PT called to request a refill for her BP med amLODipine (NORVASC) 10 MG tablet  Can you please sent it to the pharmacy on the system Please follow up

## 2016-08-05 ENCOUNTER — Ambulatory Visit: Payer: Medicaid Other | Attending: Family Medicine | Admitting: Family Medicine

## 2016-08-05 ENCOUNTER — Encounter: Payer: Self-pay | Admitting: Family Medicine

## 2016-08-05 VITALS — BP 154/99 | HR 82 | Temp 98.2°F | Resp 16 | Ht 64.0 in | Wt 233.8 lb

## 2016-08-05 DIAGNOSIS — G43909 Migraine, unspecified, not intractable, without status migrainosus: Secondary | ICD-10-CM | POA: Diagnosis present

## 2016-08-05 DIAGNOSIS — I10 Essential (primary) hypertension: Secondary | ICD-10-CM

## 2016-08-05 DIAGNOSIS — M546 Pain in thoracic spine: Secondary | ICD-10-CM

## 2016-08-05 DIAGNOSIS — K219 Gastro-esophageal reflux disease without esophagitis: Secondary | ICD-10-CM

## 2016-08-05 DIAGNOSIS — J4520 Mild intermittent asthma, uncomplicated: Secondary | ICD-10-CM | POA: Diagnosis not present

## 2016-08-05 DIAGNOSIS — M93001 Unspecified slipped upper femoral epiphysis (nontraumatic), right hip: Secondary | ICD-10-CM

## 2016-08-05 DIAGNOSIS — G43809 Other migraine, not intractable, without status migrainosus: Secondary | ICD-10-CM

## 2016-08-05 DIAGNOSIS — G8929 Other chronic pain: Secondary | ICD-10-CM

## 2016-08-05 MED ORDER — KETOROLAC TROMETHAMINE 60 MG/2ML IM SOLN
60.0000 mg | Freq: Once | INTRAMUSCULAR | Status: AC
  Administered 2016-08-05: 60 mg via INTRAMUSCULAR

## 2016-08-05 MED ORDER — FAMOTIDINE 20 MG PO TABS: 20.0000 mg | ORAL_TABLET | Freq: Two times a day (BID) | ORAL | 3 refills | Status: DC

## 2016-08-05 MED ORDER — ACETAMINOPHEN-CODEINE #3 300-30 MG PO TABS: 1.0000 | ORAL_TABLET | Freq: Two times a day (BID) | ORAL | 1 refills | Status: DC | PRN

## 2016-08-05 MED ORDER — CYCLOBENZAPRINE HCL 10 MG PO TABS: 10.0000 mg | ORAL_TABLET | Freq: Three times a day (TID) | ORAL | 3 refills | Status: DC | PRN

## 2016-08-05 MED ORDER — IBUPROFEN 600 MG PO TABS: 600.0000 mg | ORAL_TABLET | Freq: Two times a day (BID) | ORAL | 3 refills | Status: DC | PRN

## 2016-08-05 MED ORDER — AMLODIPINE BESYLATE 10 MG PO TABS: 10.0000 mg | ORAL_TABLET | Freq: Every day | ORAL | 3 refills | Status: DC

## 2016-08-05 MED ORDER — ALBUTEROL SULFATE HFA 108 (90 BASE) MCG/ACT IN AERS: 2.0000 | INHALATION_SPRAY | Freq: Four times a day (QID) | RESPIRATORY_TRACT | 3 refills | Status: DC | PRN

## 2016-08-05 MED ORDER — MOMETASONE FURO-FORMOTEROL FUM 200-5 MCG/ACT IN AERO: 2.0000 | INHALATION_SPRAY | Freq: Two times a day (BID) | RESPIRATORY_TRACT | 3 refills | Status: DC

## 2016-08-05 MED ORDER — MONTELUKAST SODIUM 10 MG PO TABS: 10.0000 mg | ORAL_TABLET | Freq: Every day | ORAL | 3 refills | Status: DC

## 2016-08-05 MED ORDER — TOPIRAMATE 50 MG PO TABS: 50.0000 mg | ORAL_TABLET | Freq: Two times a day (BID) | ORAL | 3 refills | Status: DC

## 2016-08-05 NOTE — Progress Notes (Signed)
Subjective:  Patient ID: Kelly Byrd, female    DOB: 06-Mar-1976  Age: 41 y.o. MRN: [id number]  CC: Medication Refill (HTN;Asthma)   HPI Kelly Byrd is a 41 year old female with a history of hypertension, asthma, migraine, anemia (secondary to menorrhagia from fibroids) who comes into the clinic for follow-up visit.  She complains of a migraine headache and has been out of Topamax for the last couple of months; using ibuprofen with mild relief in symptoms. Has associated nausea but no vomiting, has photophobia.   Endorses noncompliance with antihypertensives due to running out of medications and lack of finances to obtain meds.  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 She is unable to bear much weight on her right leg and pain is disabling; she takes ibuprofen with minimal relief. She also has pain in her mid to lower back which does radiate down her legs and she denies numbness.  She is currently sad; lost her son in November of last year. Scheduled for counseling at [facility].   Past Medical History:  Diagnosis Date  . Asthma   . Back pain   . GERD (gastroesophageal reflux disease)   . Hypertension   . Kidney stone   . Migraines   . Sickle cell trait [facility])     Past Surgical History:  Procedure Laterality Date  . CESAREAN SECTION     2    Allergies  Allergen Reactions  . Ace Inhibitors Swelling  . Lisinopril   . Peanuts [Peanut Oil]     Outpatient Medications Prior to Visit  Medication Sig Dispense Refill  . amLODipine (NORVASC) 10 MG tablet TAKE 1 TABLET BY MOUTH EVERY DAY. 30 tablet 0  . ferrous sulfate 325 (65 FE) MG tablet Take 1 tablet (325 mg total) by mouth 3 (three) times daily with meals. (Patient not taking: Reported  on 08/05/2016) 90 tablet 3  . albuterol (PROVENTIL HFA;VENTOLIN HFA) 108 (90 Base) MCG/ACT inhaler Inhale 2 puffs into the lungs every 6 (six) hours as needed for wheezing or shortness of breath (wheezing). (Patient not taking: Reported on 08/05/2016) 1 Inhaler 3  . diphenhydrAMINE (BENADRYL) 25 mg capsule Take 1 capsule (25 mg total) by mouth every 8 (eight) hours. (Patient not taking: Reported on 03/26/2016) 10 capsule 0  . famotidine (PEPCID) 20 MG tablet Take 1 tablet (20 mg total) by mouth 2 (two) times daily. (Patient not taking: Reported on 08/05/2016) 6 tablet 0  . HYDROcodone-acetaminophen (NORCO/VICODIN) 5-325 MG tablet Take 1 tablet by mouth every 6 (six) hours as needed. (Patient not taking: Reported on 08/05/2016) 8 tablet 0  . ibuprofen (ADVIL,MOTRIN) 600 MG tablet Take 1 tablet (600 mg total) by mouth every 12 (twelve) hours as needed for mild pain or moderate pain. (Patient not taking: Reported on 08/05/2016) 60 tablet 2  . meloxicam (MOBIC) 15 MG tablet Take 15 mg by mouth daily.    . mometasone-formoterol (DULERA) 200-5 MCG/ACT AERO Inhale 2 puffs into the lungs 2 (two) times daily. (Patient not taking: Reported on 08/05/2016) 1 Inhaler 3  . montelukast (SINGULAIR) 10 MG tablet Take 1 tablet (10 mg total) by mouth at bedtime. (Patient not taking: Reported on 08/05/2016) 30 tablet 3  . topiramate (TOPAMAX) 50 MG tablet Take 1 tablet (50 mg total) by mouth 2 (two)  times daily. (Patient not taking: Reported on 08/05/2016) 60 tablet 3   No facility-administered medications prior to visit.     ROS Review of Systems Constitutional: Negative for activity change, appetite change and fatigue.  HENT: Negative for congestion, sinus pressure and sore throat.   Eyes: Positive for photophobia  Respiratory: Negative for cough, chest tightness, shortness of breath and wheezing.   Cardiovascular: Negative for chest pain and palpitations.  Gastrointestinal: Negative for abdominal pain, constipation and  abdominal distention, positive for nausea, negative for vomiting  Endocrine: Negative for polydipsia.  Genitourinary: Positive for menstrual problem. Negative for dysuria and frequency.  Musculoskeletal: Positive for back pain. Negative for arthralgias.       Right hip pain  Skin: Negative for rash.  Neurological: Negative for tremors, light-headedness and numbness, positive for headache  Hematological: Does not bruise/bleed easily.  Psychiatric/Behavioral: Negative for behavioral problems and agitation.  negative for suicidal ideation  Objective:  BP (!) 154/99 (BP Location: Right Arm, Patient Position: Sitting, Cuff Size: Large) Comment: Pt states she has been w/o BP x 1 wk  Pulse 82   Temp 98.2 F (36.8 C) (Oral)   Resp 16   Ht '5\' 4"'  (1.626 m)   Wt 233 lb 12.8 oz (106.1 kg)   LMP 08/04/2016 (Exact Date)   SpO2 99%   BMI 40.13 kg/m   BP/Weight 08/05/2016 03/26/2016 62/37/6283  Systolic BP 151 761 607  Diastolic BP 99 95 371  Wt. (Lbs) 233.8 - 210  BMI 40.13 - 36.05      Physical Exam Constitutional: She is oriented to person, place, and time.  Obese, acutely ill looking  Cardiovascular: Normal rate, normal heart sounds and intact distal pulses.   No murmur heard. Pulmonary/Chest: Effort normal and breath sounds normal. She has no wheezes. She has no rales. She exhibits no tenderness.  Abdominal: Soft. Bowel sounds are normal. She exhibits no distension and no mass. There is no tenderness.  Musculoskeletal: She exhibits tenderness (tenderness on palpation of right thoracic back muscle)And on palpation of right hip and her range of motion.  Neurological: She is alert and oriented to person, place, and time.  Skin: Normal Psych: Dysphoric mood.  Assessment & Plan:   1. Essential hypertension Uncontrolled due to running out of medications Low-sodium diet, lifestyle modifications including weight loss - amLODipine (NORVASC) 10 MG tablet; Take 1 tablet (10 mg total) by  mouth daily.  Dispense: 30 tablet; Refill: 3 - CMP14+EGFR; Future - Lipid panel; Future  2. Chronic right-sided thoracic back pain Uncontrolled We'll add a muscle relaxant to regimen Apply heat Demonstrated stretching exercises - ibuprofen (ADVIL,MOTRIN) 600 MG tablet; Take 1 tablet (600 mg total) by mouth every 12 (twelve) hours as needed for mild pain or moderate pain.  Dispense: 60 tablet; Refill: 3 - cyclobenzaprine (FLEXERIL) 10 MG tablet; Take 1 tablet (10 mg total) by mouth 3 (three) times daily as needed for muscle spasms.  Dispense: 60 tablet; Refill: 3  3. 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 3 - acetaminophen-codeine (TYLENOL #3) 300-30 MG tablet; Take 1 tablet by mouth every 12 (twelve) hours as needed for moderate pain.  Dispense: 60 tablet; Refill: 1  4. Other migraine without status migrainosus, not intractable Uncontrolled due to running out of Topamax Intramuscular Toradol in the clinic - topiramate (TOPAMAX) 50 MG tablet; Take 1 tablet (50 mg total) by mouth 2 (two) times daily.  Dispense: 60 tablet; Refill:  3 - ketorolac (TORADOL) injection 60 mg; Inject 2 mLs (60 mg total) into the muscle once.  5. Mild intermittent asthma without complication Stable No acute flare - albuterol (PROVENTIL HFA;VENTOLIN HFA) 108 (90 Base) MCG/ACT inhaler; Inhale 2 puffs into the lungs every 6 (six) hours as needed for wheezing or shortness of breath (wheezing).  Dispense: 1 Inhaler; Refill: 3 - montelukast (SINGULAIR) 10 MG tablet; Take 1 tablet (10 mg total) by mouth at bedtime.  Dispense: 30 tablet; Refill: 3 - mometasone-formoterol (DULERA) 200-5 MCG/ACT AERO; Inhale 2 puffs into the lungs 2 (two) times daily.  Dispense: 1 Inhaler; Refill: 3  6. Gastroesophageal reflux disease without esophagitis Controlled - famotidine (PEPCID) 20 MG tablet; Take 1 tablet (20 mg total) by mouth 2 (two) times  daily.  Dispense: 60 tablet; Refill: 3   Meds ordered this encounter  Medications  . albuterol (PROVENTIL HFA;VENTOLIN HFA) 108 (90 Base) MCG/ACT inhaler    Sig: Inhale 2 puffs into the lungs every 6 (six) hours as needed for wheezing or shortness of breath (wheezing).    Dispense:  1 Inhaler    Refill:  3  . amLODipine (NORVASC) 10 MG tablet    Sig: Take 1 tablet (10 mg total) by mouth daily.    Dispense:  30 tablet    Refill:  3    Must have office visit for refills  . famotidine (PEPCID) 20 MG tablet    Sig: Take 1 tablet (20 mg total) by mouth 2 (two) times daily.    Dispense:  60 tablet    Refill:  3  . montelukast (SINGULAIR) 10 MG tablet    Sig: Take 1 tablet (10 mg total) by mouth at bedtime.    Dispense:  30 tablet    Refill:  3  . mometasone-formoterol (DULERA) 200-5 MCG/ACT AERO    Sig: Inhale 2 puffs into the lungs 2 (two) times daily.    Dispense:  1 Inhaler    Refill:  3  . topiramate (TOPAMAX) 50 MG tablet    Sig: Take 1 tablet (50 mg total) by mouth 2 (two) times daily.    Dispense:  60 tablet    Refill:  3  . ibuprofen (ADVIL,MOTRIN) 600 MG tablet    Sig: Take 1 tablet (600 mg total) by mouth every 12 (twelve) hours as needed for mild pain or moderate pain.    Dispense:  60 tablet    Refill:  3  . cyclobenzaprine (FLEXERIL) 10 MG tablet    Sig: Take 1 tablet (10 mg total) by mouth 3 (three) times daily as needed for muscle spasms.    Dispense:  60 tablet    Refill:  3  . acetaminophen-codeine (TYLENOL #3) 300-30 MG tablet    Sig: Take 1 tablet by mouth every 12 (twelve) hours as needed for moderate pain.    Dispense:  60 tablet    Refill:  1  . ketorolac (TORADOL) injection 60 mg    Follow-up: Return in about 3 months (around 11/05/2016) for follow Up of chronic medical conditions.   Arnoldo Morale MD

## 2016-08-05 NOTE — Patient Instructions (Signed)

## 2016-08-06 ENCOUNTER — Ambulatory Visit: Payer: Medicaid Other | Attending: Family Medicine

## 2016-08-06 DIAGNOSIS — I10 Essential (primary) hypertension: Secondary | ICD-10-CM | POA: Diagnosis not present

## 2016-08-06 NOTE — Progress Notes (Signed)
Patient here for lab visit only 

## 2016-08-07 LAB — CMP14+EGFR
ALK PHOS: 49 IU/L (ref 39–117)
ALT: 26 IU/L (ref 0–32)
AST: 16 IU/L (ref 0–40)
Albumin/Globulin Ratio: 1.5 (ref 1.2–2.2)
Albumin: 3.7 g/dL (ref 3.5–5.5)
BUN/Creatinine Ratio: 17 (ref 9–23)
BUN: 11 mg/dL (ref 6–20)
Bilirubin Total: <0.2 mg/dL (ref 0.0–1.2)
CO2: 21 mmol/L (ref 18–29)
CREATININE: 0.66 mg/dL (ref 0.57–1.00)
Calcium: 8.9 mg/dL (ref 8.7–10.2)
Chloride: 100 mmol/L (ref 96–106)
GFR calc Af Amer: 129 mL/min/{1.73_m2} (ref >59)
GFR calc non Af Amer: 112 mL/min/{1.73_m2} (ref >59)
GLUCOSE: 87 mg/dL (ref 65–99)
Globulin, Total: 2.5 g/dL (ref 1.5–4.5)
Potassium: 3.9 mmol/L (ref 3.5–5.2)
Sodium: 138 mmol/L (ref 134–144)
Total Protein: 6.2 g/dL (ref 6.0–8.5)

## 2016-08-07 LAB — LIPID PANEL
CHOL/HDL RATIO: 4.4 ratio (ref 0.0–4.4)
Cholesterol, Total: 193 mg/dL (ref 100–199)
HDL: 44 mg/dL (ref >39)
LDL Calculated: 132 mg/dL — ABNORMAL HIGH (ref 0–99)
Triglycerides: 87 mg/dL (ref 0–149)
VLDL Cholesterol Cal: 17 mg/dL (ref 5–40)

## 2016-08-09 ENCOUNTER — Other Ambulatory Visit: Payer: Self-pay | Admitting: Family Medicine

## 2016-08-09 ENCOUNTER — Telehealth: Payer: Self-pay | Admitting: *Deleted

## 2016-08-09 ENCOUNTER — Telehealth: Payer: Self-pay | Admitting: Family Medicine

## 2016-08-09 MED ORDER — ATORVASTATIN CALCIUM 20 MG PO TABS: 20.0000 mg | ORAL_TABLET | Freq: Every day | ORAL | 3 refills | Status: DC

## 2016-08-09 NOTE — Telephone Encounter (Signed)
-----   Message from Arnoldo Morale, MD sent at 08/09/2016  9:38 AM EDT ----- Electrolytes, liver function and kidney function are normal. Cholesterol is elevated, I have sent a prescription for atorvastatin to her pharmacy.

## 2016-08-09 NOTE — Telephone Encounter (Signed)
Medical Assistant left message on patient's home and cell voicemail. Voicemail states to give a call back to Bader Stubblefield with [facility] at [phone].  

## 2016-08-09 NOTE — Telephone Encounter (Signed)
Patient called in returning call to [place]. Requests a call back. Thank you.

## 2016-08-10 NOTE — Telephone Encounter (Signed)
Medical Assistant left message on patient's home and cell voicemail. Voicemail states to give a call back to [place] with [facility] at [phone].  !!!Please inform patient of labs being normal except for cholesterol level being elevated and atorvastatin being sent to the [facility] in [place]!!!

## 2016-08-16 NOTE — Telephone Encounter (Signed)
Pt. Called and was informed of her lab results. Pt. Also requested to speak with her nurse regarding her head. Pt.also stated she is have back pain. Please f/u with pt.

## 2016-08-16 NOTE — Telephone Encounter (Signed)
Patient needs to be scheduled an appointment for assessment of concerns.

## 2016-09-28 ENCOUNTER — Emergency Department
Admission: EM | Admit: 2016-09-28 | Discharge: 2016-09-28 | Disposition: A | Discharge status: Home / Self Care | Payer: Medicaid Other | Attending: Emergency Medicine | Admitting: Emergency Medicine

## 2016-09-28 ENCOUNTER — Emergency Department: Payer: Medicaid Other

## 2016-09-28 ENCOUNTER — Encounter: Payer: Self-pay | Admitting: Emergency Medicine

## 2016-09-28 DIAGNOSIS — J45909 Unspecified asthma, uncomplicated: Secondary | ICD-10-CM | POA: Insufficient documentation

## 2016-09-28 DIAGNOSIS — F17210 Nicotine dependence, cigarettes, uncomplicated: Secondary | ICD-10-CM | POA: Insufficient documentation

## 2016-09-28 DIAGNOSIS — D573 Sickle-cell trait: Secondary | ICD-10-CM | POA: Insufficient documentation

## 2016-09-28 DIAGNOSIS — D500 Iron deficiency anemia secondary to blood loss (chronic): Secondary | ICD-10-CM | POA: Insufficient documentation

## 2016-09-28 DIAGNOSIS — R0789 Other chest pain: Secondary | ICD-10-CM | POA: Insufficient documentation

## 2016-09-28 DIAGNOSIS — R079 Chest pain, unspecified: Secondary | ICD-10-CM

## 2016-09-28 DIAGNOSIS — I10 Essential (primary) hypertension: Secondary | ICD-10-CM | POA: Insufficient documentation

## 2016-09-28 DIAGNOSIS — R0602 Shortness of breath: Secondary | ICD-10-CM | POA: Insufficient documentation

## 2016-09-28 DIAGNOSIS — G44209 Tension-type headache, unspecified, not intractable: Secondary | ICD-10-CM | POA: Insufficient documentation

## 2016-09-28 DIAGNOSIS — F419 Anxiety disorder, unspecified: Secondary | ICD-10-CM | POA: Insufficient documentation

## 2016-09-28 DIAGNOSIS — Z91010 Allergy to peanuts: Secondary | ICD-10-CM | POA: Insufficient documentation

## 2016-09-28 LAB — BASIC METABOLIC PANEL
Anion gap: 10 (ref 5–15)
BUN: 8 mg/dL (ref 6–20)
CHLORIDE: 106 mmol/L (ref 101–111)
CO2: 24 mmol/L (ref 22–32)
Calcium: 9.3 mg/dL (ref 8.9–10.3)
Creatinine, Ser: 0.61 mg/dL (ref 0.44–1.00)
GFR calc Af Amer: >60 mL/min (ref >60)
GFR calc non Af Amer: >60 mL/min (ref >60)
Glucose, Bld: 113 mg/dL — ABNORMAL HIGH (ref 65–99)
POTASSIUM: 3.1 mmol/L — AB (ref 3.5–5.1)
SODIUM: 140 mmol/L (ref 135–145)

## 2016-09-28 LAB — CBC
HEMATOCRIT: 29.6 % — AB (ref 35.0–47.0)
HEMOGLOBIN: 9.2 g/dL — AB (ref 12.0–16.0)
MCH: 18.8 pg — ABNORMAL LOW (ref 26.0–34.0)
MCHC: 31 g/dL — ABNORMAL LOW (ref 32.0–36.0)
MCV: 60.8 fL — AB (ref 80.0–100.0)
Platelets: 486 10*3/uL — ABNORMAL HIGH (ref 150–440)
RBC: 4.87 MIL/uL (ref 3.80–5.20)
RDW: 18.2 % — AB (ref 11.5–14.5)
WBC: 12.3 10*3/uL — AB (ref 3.6–11.0)

## 2016-09-28 LAB — TROPONIN I: Troponin I: <0.03 ng/mL (ref <0.03)

## 2016-09-28 MED ORDER — FERROUS SULFATE DRIED ER 160 (50 FE) MG PO TBCR: 160.0000 mg | EXTENDED_RELEASE_TABLET | Freq: Every day | ORAL | 6 refills | Status: DC

## 2016-09-28 MED ORDER — BUTALBITAL-APAP-CAFFEINE 50-325-40 MG PO TABS
ORAL_TABLET | ORAL | Status: AC
  Filled 2016-09-28: qty 2

## 2016-09-28 MED ORDER — IBUPROFEN 800 MG PO TABS
ORAL_TABLET | ORAL | Status: AC
  Administered 2016-09-28: 800 mg via ORAL
  Filled 2016-09-28: qty 1

## 2016-09-28 MED ORDER — NORGESTREL-ETHINYL ESTRADIOL 0.3-30 MG-MCG PO TABS: 1.0000 | ORAL_TABLET | Freq: Every day | ORAL | 11 refills | Status: DC

## 2016-09-28 MED ORDER — DIAZEPAM 5 MG PO TABS
10.0000 mg | ORAL_TABLET | Freq: Once | ORAL | Status: AC
  Administered 2016-09-28: 10 mg via ORAL

## 2016-09-28 MED ORDER — DIAZEPAM 5 MG PO TABS
ORAL_TABLET | ORAL | Status: AC
  Administered 2016-09-28: 10 mg via ORAL
  Filled 2016-09-28: qty 2

## 2016-09-28 MED ORDER — BUTALBITAL-APAP-CAFFEINE 50-325-40 MG PO TABS: 1.0000 | ORAL_TABLET | Freq: Four times a day (QID) | ORAL | 0 refills | Status: DC | PRN

## 2016-09-28 MED ORDER — IBUPROFEN 800 MG PO TABS
800.0000 mg | ORAL_TABLET | Freq: Once | ORAL | Status: AC
  Administered 2016-09-28: 800 mg via ORAL

## 2016-09-28 MED ORDER — BUTALBITAL-APAP-CAFFEINE 50-325-40 MG PO TABS
2.0000 | ORAL_TABLET | Freq: Once | ORAL | Status: AC
Start: 2016-09-28 — End: 2016-09-28
  Administered 2016-09-28: 2 via ORAL

## 2016-09-28 MED ORDER — DIAZEPAM 5 MG PO TABS: 5.0000 mg | ORAL_TABLET | Freq: Three times a day (TID) | ORAL | 0 refills | Status: DC | PRN

## 2016-09-28 NOTE — ED Notes (Signed)
Pt report shaving had SOB and cough with nasal congestion for the past couple days. Pt is tearful upon arrival to ED treatment room from increased WOB. Pt has to take pauses when speaking in order to catch breath before continuing.

## 2016-09-28 NOTE — ED Provider Notes (Signed)
[facility] Emergency Department Provider Note       Time seen: ----------------------------------------- 9:07 PM on 09/28/2016 -----------------------------------------     I have reviewed the triage vital signs and the nursing notes.   HISTORY   Chief Complaint Chest Pain and Headache    HPI Kelly Byrd is a 41 y.o. female who presents to the ED for chest pain that began approximately 2 days ago. Patient states it feels like something sitting on her chest, describes as a throbbing sensation. She has a history of asthma and states this feels different than asthma. Patient does not describe a history of anxiety. She denies fevers, chills, has had recent cough which has improved. She denies vomiting or diarrhea.   Past Medical History:  Diagnosis Date  . Asthma   . Back pain   . GERD (gastroesophageal reflux disease)   . Hypertension   . Kidney stone   . Migraines   . Sickle cell trait [facility])     Patient Active Problem List   Diagnosis Date Noted  . GERD (gastroesophageal reflux disease) 08/05/2016  . Fibroids 09/19/2015  . Hypertension 08/29/2015  . Asthma 08/29/2015  . Back pain 08/29/2015  . 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 Anemia, iron deficiency 10/29/2014  . Menometrorrhagia 10/29/2014  . Migraine 10/27/2014    Past Surgical History:  Procedure Laterality Date  . CESAREAN SECTION     2    Allergies Ace inhibitors; Lisinopril; and Peanuts [peanut oil]  Social History Social History  Substance Use Topics  . Smoking status: Current Every Day Smoker    Packs/day: 0.25    Types: Cigarettes  . Smokeless tobacco: Never Used  . Alcohol use 0.6 oz/week    1 Cans of beer per week    Review of Systems Constitutional: Negative for fever. Eyes: Negative for vision changes ENT:  Negative for congestion, sore throat Cardiovascular: Positive for chest pain Respiratory: Positive for shortness of  breath Gastrointestinal: Negative for abdominal pain, vomiting and diarrhea. Genitourinary: Negative for dysuria. Musculoskeletal: Negative for back pain. Skin: Negative for rash. Neurological: Negative for headaches, focal weakness or numbness.  All systems negative/normal/unremarkable except as stated in the HPI  ____________________________________________   PHYSICAL EXAM:  VITAL SIGNS: ED Triage Vitals  Enc Vitals Group     BP 09/28/16 2048 (!) 151/83     Pulse Rate 09/28/16 2048 83     Resp 09/28/16 2048 (!) 22     Temp 09/28/16 2048 98.6 F (37 C)     Temp Source 09/28/16 2048 Oral     SpO2 09/28/16 2048 97 %     Weight 09/28/16 2049 236 lb (107 kg)     Height 09/28/16 2049 5\' 4"  (1.626 m)     Head Circumference --      Peak Flow --      Pain Score 09/28/16 2054 10     Pain Loc --      Pain Edu? --      Excl. in [place]? --     Constitutional: Alert and oriented. Anxious and tearful, no distress Eyes: Conjunctivae are normal. Normal extraocular movements. ENT   Head: Normocephalic and atraumatic.   Nose: No congestion/rhinnorhea.   Mouth/Throat: Mucous membranes are moist.   Neck: No stridor. Cardiovascular: Normal rate, regular rhythm. No murmurs, rubs, or gallops. Respiratory: Tachypnea with clear breath sounds Gastrointestinal: Soft and nontender. Normal bowel sounds Musculoskeletal: Nontender with normal range of motion in extremities. No lower extremity tenderness  nor edema. Neurologic:  Normal speech and language. No gross focal neurologic deficits are appreciated.  Skin:  Skin is warm, dry and intact. No rash noted. Psychiatric: Mood and affect are normal. Speech and behavior are normal.  ____________________________________________  EKG: Interpreted by me. Sinus rhythm rate 83 bpm, normal PR interval, normal QRS, normal QT.  ____________________________________________  ED COURSE:  Pertinent labs & imaging results that were available during  my care of the patient were reviewed by me and considered in my medical decision making (see chart for details). Patient presents for multiple complaints including chest pain and headache, we will assess with labs and imaging as indicated.   Procedures ____________________________________________   LABS (pertinent positives/negatives)  Labs Reviewed  BASIC METABOLIC PANEL - Abnormal; Notable for the following:       Result Value   Potassium 3.1 (*)    Glucose, Bld 113 (*)    All other components within normal limits  CBC - Abnormal; Notable for the following:    WBC 12.3 (*)    Hemoglobin 9.2 (*)    HCT 29.6 (*)    MCV 60.8 (*)    MCH 18.8 (*)    MCHC 31.0 (*)    RDW 18.2 (*)    Platelets 486 (*)    All other components within normal limits  TROPONIN I    RADIOLOGY  Chest x-ray IMPRESSION: No evidence of acute cardiopulmonary disease.  Cardiomegaly and mild chronic peribronchial thickening.  ____________________________________________  FINAL ASSESSMENT AND PLAN  Chest pain,Anxiety, headache, iron deficiency anemia  Plan: Patient's labs and imaging were dictated above. Patient had presented for headaches which are acute on chronic for her and chest pain. Unclear etiology for the chest pain but there did appear to be an anxiety component. I will prescribe Valium for muscle relaxation as well as for anxiety. She was given Fioricet here for headache and I will try her on birth control for menstrual cycle regulation. She is encouraged to continue iron as previous prescribed.   Earleen Newport, MD   Note: This note was generated in part or whole with voice recognition software. Voice recognition is usually quite accurate but there are transcription errors that can and very often do occur. I apologize for any typographical errors that were not detected and corrected.     Earleen Newport, MD 09/28/16 2221

## 2016-09-28 NOTE — ED Triage Notes (Signed)
Patient to ER for chest pain to mid chest and under left breast that began approx 2 days ago. Patient states she feels like something is sitting on her chest, but also describes pain as throbbing. Reports h/o asthma, but states feels different than typical asthma feelings.

## 2016-10-22 ENCOUNTER — Inpatient Hospital Stay: Payer: Self-pay | Admitting: Family Medicine

## 2017-04-11 ENCOUNTER — Other Ambulatory Visit: Payer: Self-pay

## 2017-04-11 ENCOUNTER — Encounter (HOSPITAL_COMMUNITY): Payer: Self-pay

## 2017-04-11 ENCOUNTER — Emergency Department (HOSPITAL_COMMUNITY)
Admission: EM | Admit: 2017-04-11 | Discharge: 2017-04-11 | Disposition: A | Discharge status: Home / Self Care | Payer: Self-pay | Attending: Emergency Medicine | Admitting: Emergency Medicine

## 2017-04-11 ENCOUNTER — Emergency Department (HOSPITAL_COMMUNITY): Payer: Self-pay

## 2017-04-11 DIAGNOSIS — F17210 Nicotine dependence, cigarettes, uncomplicated: Secondary | ICD-10-CM | POA: Insufficient documentation

## 2017-04-11 DIAGNOSIS — M545 Low back pain: Secondary | ICD-10-CM | POA: Insufficient documentation

## 2017-04-11 DIAGNOSIS — I10 Essential (primary) hypertension: Secondary | ICD-10-CM | POA: Insufficient documentation

## 2017-04-11 DIAGNOSIS — N390 Urinary tract infection, site not specified: Secondary | ICD-10-CM | POA: Insufficient documentation

## 2017-04-11 DIAGNOSIS — Z79899 Other long term (current) drug therapy: Secondary | ICD-10-CM | POA: Insufficient documentation

## 2017-04-11 DIAGNOSIS — R0789 Other chest pain: Secondary | ICD-10-CM | POA: Insufficient documentation

## 2017-04-11 DIAGNOSIS — J45909 Unspecified asthma, uncomplicated: Secondary | ICD-10-CM | POA: Insufficient documentation

## 2017-04-11 LAB — URINALYSIS, ROUTINE W REFLEX MICROSCOPIC
BILIRUBIN URINE: NEGATIVE
GLUCOSE, UA: NEGATIVE mg/dL
HGB URINE DIPSTICK: NEGATIVE
KETONES UR: NEGATIVE mg/dL
Nitrite: POSITIVE — AB
Protein, ur: 30 mg/dL — AB
Specific Gravity, Urine: 1.014 (ref 1.005–1.030)
pH: 6 (ref 5.0–8.0)

## 2017-04-11 LAB — BASIC METABOLIC PANEL
ANION GAP: 5 (ref 5–15)
BUN: 12 mg/dL (ref 6–20)
CALCIUM: 9.2 mg/dL (ref 8.9–10.3)
CO2: 27 mmol/L (ref 22–32)
Chloride: 106 mmol/L (ref 101–111)
Creatinine, Ser: 0.68 mg/dL (ref 0.44–1.00)
GFR calc Af Amer: >60 mL/min (ref >60)
GFR calc non Af Amer: >60 mL/min (ref >60)
GLUCOSE: 123 mg/dL — AB (ref 65–99)
Potassium: 3.5 mmol/L (ref 3.5–5.1)
Sodium: 138 mmol/L (ref 135–145)

## 2017-04-11 LAB — CBC
HCT: 33.6 % — ABNORMAL LOW (ref 36.0–46.0)
HEMOGLOBIN: 9.9 g/dL — AB (ref 12.0–15.0)
MCH: 19.4 pg — AB (ref 26.0–34.0)
MCHC: 29.5 g/dL — AB (ref 30.0–36.0)
MCV: 65.8 fL — ABNORMAL LOW (ref 78.0–100.0)
Platelets: 479 10*3/uL — ABNORMAL HIGH (ref 150–400)
RBC: 5.11 MIL/uL (ref 3.87–5.11)
RDW: 22.4 % — AB (ref 11.5–15.5)
WBC: 11 10*3/uL — ABNORMAL HIGH (ref 4.0–10.5)

## 2017-04-11 LAB — TROPONIN I: Troponin I: <0.03 ng/mL (ref <0.03)

## 2017-04-11 LAB — I-STAT BETA HCG BLOOD, ED (MC, WL, AP ONLY): I-stat hCG, quantitative: <5 m[IU]/mL (ref <5)

## 2017-04-11 LAB — I-STAT TROPONIN, ED: Troponin i, poc: 0 ng/mL (ref 0.00–0.08)

## 2017-04-11 MED ORDER — CEPHALEXIN 500 MG PO CAPS: 500.0000 mg | ORAL_CAPSULE | Freq: Four times a day (QID) | ORAL | 0 refills | Status: DC

## 2017-04-11 MED ORDER — DICLOFENAC SODIUM 75 MG PO TBEC: 75.0000 mg | DELAYED_RELEASE_TABLET | Freq: Two times a day (BID) | ORAL | 0 refills | Status: DC

## 2017-04-11 NOTE — Discharge Instructions (Signed)
Return if any problems.

## 2017-04-11 NOTE — ED Notes (Signed)
Bed: [facility] Expected date:  Expected time:  Means of arrival:  Comments: 

## 2017-04-11 NOTE — ED Notes (Signed)
Patient transported to X-ray 

## 2017-04-11 NOTE — ED Provider Notes (Signed)
[place] DEPT Provider Note   CSN: [id number] Arrival date & time: 04/11/17  1233     History   Chief Complaint Chief Complaint  Patient presents with  . Chest Pain  . Back Pain    HPI Ilean Spradlin is a 42 y.o. female.  The history is provided by the patient. No language interpreter was used.  Chest Pain   This is a new problem. The problem occurs constantly. The problem has been gradually worsening. The pain is associated with exertion. The pain is moderate. The quality of the pain is described as dull. The pain radiates to the lower back. The symptoms are aggravated by certain positions. Associated symptoms include back pain.  Back Pain   Associated symptoms include chest pain.  Pt reports she has pain in her low back and thinks she has a uti.  Pt reports she has also  had chest soreness.   Past Medical History:  Diagnosis Date  . Asthma   . Back pain   . GERD (gastroesophageal reflux disease)   . Hypertension   . Kidney stone   . Migraines   . Sickle cell trait [facility])     Patient Active Problem List   Diagnosis Date Noted  . GERD (gastroesophageal reflux disease) 08/05/2016  . Fibroids 09/19/2015  . Hypertension 08/29/2015  . Asthma 08/29/2015  . Back pain 08/29/2015  . 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 Anemia, iron deficiency 10/29/2014  . Menometrorrhagia 10/29/2014  . Migraine 10/27/2014    Past Surgical History:  Procedure Laterality Date  . CESAREAN SECTION     2    OB History    Gravida Para Term Preterm AB Living   3 3 3     3    SAB TAB Ectopic Multiple Live Births           3       Home Medications    Prior to Admission medications   Medication Sig Start Date End Date Taking? Authorizing Provider  acetaminophen-codeine (TYLENOL #3) 300-30 MG tablet Take 1 tablet by mouth every 12 (twelve) hours as needed for moderate pain. 08/05/16   Arnoldo Morale, MD  albuterol (PROVENTIL HFA;VENTOLIN  HFA) 108 (90 Base) MCG/ACT inhaler Inhale 2 puffs into the lungs every 6 (six) hours as needed for wheezing or shortness of breath (wheezing). 08/05/16   Arnoldo Morale, MD  amLODipine (NORVASC) 10 MG tablet Take 1 tablet (10 mg total) by mouth daily. 08/05/16   Arnoldo Morale, MD  atorvastatin (LIPITOR) 20 MG tablet Take 1 tablet (20 mg total) by mouth daily. 08/09/16   Arnoldo Morale, MD  butalbital-acetaminophen-caffeine (FIORICET, ESGIC) 50-325-40 MG tablet Take 1-2 tablets by mouth every 6 (six) hours as needed for headache. 09/28/16 09/28/17  Earleen Newport, MD  cephALEXin (KEFLEX) 500 MG capsule Take 1 capsule (500 mg total) by mouth 4 (four) times daily. 04/11/17   Fransico Meadow, PA-C  cyclobenzaprine (FLEXERIL) 10 MG tablet Take 1 tablet (10 mg total) by mouth 3 (three) times daily as needed for muscle spasms. 08/05/16   Arnoldo Morale, MD  diazepam (VALIUM) 5 MG tablet Take 1 tablet (5 mg total) by mouth every 8 (eight) hours as needed for muscle spasms. 09/28/16   Earleen Newport, MD  diclofenac (VOLTAREN) 75 MG EC tablet Take 1 tablet (75 mg total) by mouth 2 (two) times daily. 04/11/17   Fransico Meadow, PA-C  famotidine (PEPCID) 20 MG tablet Take 1 tablet (20  mg total) by mouth 2 (two) times daily. 08/05/16   Arnoldo Morale, MD  ferrous sulfate (EQL SLOW RELEASE IRON) 160 (50 Fe) MG TBCR SR tablet Take 1 tablet (160 mg total) by mouth daily. 09/28/16   Earleen Newport, MD  ferrous sulfate 325 (65 FE) MG tablet Take 1 tablet (325 mg total) by mouth 3 (three) times daily with meals. Patient not taking: Reported on 08/05/2016 10/29/14   Aldean Jewett, MD  ibuprofen (ADVIL,MOTRIN) 600 MG tablet Take 1 tablet (600 mg total) by mouth every 12 (twelve) hours as needed for mild pain or moderate pain. 08/05/16   Arnoldo Morale, MD  mometasone-formoterol (DULERA) 200-5 MCG/ACT AERO Inhale 2 puffs into the lungs 2 (two) times daily. 08/05/16   Arnoldo Morale, MD  montelukast (SINGULAIR) 10 MG tablet  Take 1 tablet (10 mg total) by mouth at bedtime. 08/05/16   Arnoldo Morale, MD  norgestrel-ethinyl estradiol (LO/OVRAL,CRYSELLE) 0.3-30 MG-MCG tablet Take 1 tablet by mouth daily. 09/28/16   Earleen Newport, MD  topiramate (TOPAMAX) 50 MG tablet Take 1 tablet (50 mg total) by mouth 2 (two) times daily. 08/05/16   Arnoldo Morale, MD    Family History Family History  Problem Relation Age of Onset  . Hypertension Mother   . Hypertension Father   . Diabetes Mellitus II Maternal Grandmother   . Lupus Unknown     Social History Social History   Tobacco Use  . Smoking status: Current Every Day Smoker    Packs/day: 0.25    Types: Cigarettes  . Smokeless tobacco: Never Used  Substance Use Topics  . Alcohol use: Yes    Alcohol/week: 0.6 oz    Types: 1 Cans of beer per week  . Drug use: No     Allergies   Ace inhibitors; Lisinopril; and Peanuts [peanut oil]   Review of Systems Review of Systems  Cardiovascular: Positive for chest pain.  Musculoskeletal: Positive for back pain.  All other systems reviewed and are negative.    Physical Exam Updated Vital Signs BP 139/89 (BP Location: Left Arm)   Pulse 73   Temp 98.4 F (36.9 C)   Resp 18   Ht 5\' 4"  (1.626 m)   Wt 95.3 kg (210 lb)   LMP 03/14/2017   SpO2 98%   BMI 36.05 kg/m   Physical Exam  Constitutional: She is oriented to person, place, and time. She appears well-developed and well-nourished.  HENT:  Head: Normocephalic.  Eyes: EOM are normal.  Neck: Normal range of motion.  Pulmonary/Chest: Effort normal. She has no decreased breath sounds.  Tender chest wall, pain with palpation    Abdominal: She exhibits no distension.  Musculoskeletal: Normal range of motion.  Neurological: She is alert and oriented to person, place, and time.  Psychiatric: She has a normal mood and affect.  Nursing note and vitals reviewed.    ED Treatments / Results  Labs (all labs ordered are listed, but only abnormal results are  displayed) Labs Reviewed  BASIC METABOLIC PANEL - Abnormal; Notable for the following components:      Result Value   Glucose, Bld 123 (*)    All other components within normal limits  CBC - Abnormal; Notable for the following components:   WBC 11.0 (*)    Hemoglobin 9.9 (*)    HCT 33.6 (*)    MCV 65.8 (*)    MCH 19.4 (*)    MCHC 29.5 (*)    RDW 22.4 (*)  Platelets 479 (*)    All other components within normal limits  URINALYSIS, ROUTINE W REFLEX MICROSCOPIC - Abnormal; Notable for the following components:   APPearance HAZY (*)    Protein, ur 30 (*)    Nitrite POSITIVE (*)    Leukocytes, UA TRACE (*)    Bacteria, UA MANY (*)    Squamous Epithelial / LPF 6-30 (*)    All other components within normal limits  TROPONIN I  I-STAT TROPONIN, ED  I-STAT BETA HCG BLOOD, ED ([facility], [facility], [facility] ONLY)    EKG  EKG Interpretation  Date/Time:  Monday April 11 2017 12:47:29 EST Ventricular Rate:  71 PR Interval:    QRS Duration: 94 QT Interval:  440 QTC Calculation: 479 R Axis:   49 Text Interpretation:  Sinus rhythm Consider left ventricular hypertrophy no significant change since July 2018 Confirmed by Sherwood Gambler [phone]) on 04/11/2017 3:17:55 PM       Radiology Dg Chest 2 View  Result Date: 04/11/2017 CLINICAL DATA:  Left-sided chest pain, beneath the breast EXAM: CHEST  2 VIEW COMPARISON:  09/28/2016 radiograph FINDINGS: The heart size and mediastinal contours are within normal limits. Both lungs are clear. The visualized skeletal structures are unremarkable. IMPRESSION: No active cardiopulmonary disease. Electronically Signed   By: Donavan Foil M.D.   On: 04/11/2017 13:55    Procedures Procedures (including critical care time)  Medications Ordered in ED Medications - No data to display   Initial Impression / Assessment and Plan / ED Course  I have reviewed the triage vital signs and the nursing notes.  Pertinent labs & imaging results that were available during my  care of the patient were reviewed by me and considered in my medical decision making (see chart for details).     Ekg and chest xray reassuring.  Ua shows UTi.   Pt counseled on results.   Final Clinical Impressions(s) / ED Diagnoses   Final diagnoses:  Chest wall pain  Urinary tract infection without hematuria, site unspecified    ED Discharge Orders        Ordered    cephALEXin (KEFLEX) 500 MG capsule  4 times daily     04/11/17 1636    diclofenac (VOLTAREN) 75 MG EC tablet  2 times daily     04/11/17 1636    An After Visit Summary was printed and given to the patient.   Fransico Meadow, PA-C 04/11/17 1701    Sherwood Gambler, MD 04/11/17 [phone]

## 2017-04-11 NOTE — ED Triage Notes (Addendum)
Patient presents with left sided chest pain "beneath my breast" starting Saturday. Patient reports feeling "the pain gets worse when I move." Patient also developed nasal congestion, non-productive cough, and generalized lethargy starting Saturday. Patient also reports right sided back/flank pain, starting 04/07/17. Patient denies dysuria, but states "I feel pressure when I pee, which isnt normal for me." EKG completed in triage.

## 2017-04-23 ENCOUNTER — Emergency Department
Admission: EM | Admit: 2017-04-23 | Discharge: 2017-04-24 | Disposition: A | Discharge status: Home / Self Care | Payer: Self-pay | Attending: Emergency Medicine | Admitting: Emergency Medicine

## 2017-04-23 ENCOUNTER — Other Ambulatory Visit: Payer: Self-pay

## 2017-04-23 ENCOUNTER — Encounter: Payer: Self-pay | Admitting: Emergency Medicine

## 2017-04-23 DIAGNOSIS — F341 Dysthymic disorder: Secondary | ICD-10-CM | POA: Insufficient documentation

## 2017-04-23 DIAGNOSIS — T1491XA Suicide attempt, initial encounter: Secondary | ICD-10-CM | POA: Insufficient documentation

## 2017-04-23 DIAGNOSIS — Z91010 Allergy to peanuts: Secondary | ICD-10-CM | POA: Insufficient documentation

## 2017-04-23 DIAGNOSIS — F17210 Nicotine dependence, cigarettes, uncomplicated: Secondary | ICD-10-CM | POA: Insufficient documentation

## 2017-04-23 DIAGNOSIS — X838XXA Intentional self-harm by other specified means, initial encounter: Secondary | ICD-10-CM | POA: Insufficient documentation

## 2017-04-23 DIAGNOSIS — Y998 Other external cause status: Secondary | ICD-10-CM | POA: Insufficient documentation

## 2017-04-23 DIAGNOSIS — Y9389 Activity, other specified: Secondary | ICD-10-CM | POA: Insufficient documentation

## 2017-04-23 DIAGNOSIS — F329 Major depressive disorder, single episode, unspecified: Secondary | ICD-10-CM | POA: Insufficient documentation

## 2017-04-23 DIAGNOSIS — Y929 Unspecified place or not applicable: Secondary | ICD-10-CM | POA: Insufficient documentation

## 2017-04-23 DIAGNOSIS — D573 Sickle-cell trait: Secondary | ICD-10-CM | POA: Insufficient documentation

## 2017-04-23 DIAGNOSIS — J45909 Unspecified asthma, uncomplicated: Secondary | ICD-10-CM | POA: Insufficient documentation

## 2017-04-23 DIAGNOSIS — Z79899 Other long term (current) drug therapy: Secondary | ICD-10-CM | POA: Insufficient documentation

## 2017-04-23 DIAGNOSIS — I10 Essential (primary) hypertension: Secondary | ICD-10-CM | POA: Insufficient documentation

## 2017-04-23 LAB — CBC
HEMATOCRIT: 30.4 % — AB (ref 35.0–47.0)
Hemoglobin: 9.3 g/dL — ABNORMAL LOW (ref 12.0–16.0)
MCH: 19.1 pg — ABNORMAL LOW (ref 26.0–34.0)
MCHC: 30.5 g/dL — AB (ref 32.0–36.0)
MCV: 62.6 fL — AB (ref 80.0–100.0)
Platelets: 542 10*3/uL — ABNORMAL HIGH (ref 150–440)
RBC: 4.85 MIL/uL (ref 3.80–5.20)
RDW: 22.1 % — AB (ref 11.5–14.5)
WBC: 9.5 10*3/uL (ref 3.6–11.0)

## 2017-04-23 LAB — COMPREHENSIVE METABOLIC PANEL
ALT: 17 U/L (ref 14–54)
ANION GAP: 8 (ref 5–15)
AST: 18 U/L (ref 15–41)
Albumin: 4 g/dL (ref 3.5–5.0)
Alkaline Phosphatase: 51 U/L (ref 38–126)
BILIRUBIN TOTAL: 0.5 mg/dL (ref 0.3–1.2)
BUN: 8 mg/dL (ref 6–20)
CO2: 26 mmol/L (ref 22–32)
Calcium: 9.2 mg/dL (ref 8.9–10.3)
Chloride: 109 mmol/L (ref 101–111)
Creatinine, Ser: 0.66 mg/dL (ref 0.44–1.00)
GFR calc Af Amer: >60 mL/min (ref >60)
Glucose, Bld: 96 mg/dL (ref 65–99)
POTASSIUM: 3.3 mmol/L — AB (ref 3.5–5.1)
Sodium: 143 mmol/L (ref 135–145)
TOTAL PROTEIN: 7.3 g/dL (ref 6.5–8.1)

## 2017-04-23 LAB — URINE DRUG SCREEN, QUALITATIVE (ARMC ONLY)
AMPHETAMINES, UR SCREEN: NOT DETECTED
BARBITURATES, UR SCREEN: NOT DETECTED
Benzodiazepine, Ur Scrn: NOT DETECTED
Cannabinoid 50 Ng, Ur ~~LOC~~: NOT DETECTED
Cocaine Metabolite,Ur ~~LOC~~: POSITIVE — AB
MDMA (Ecstasy)Ur Screen: NOT DETECTED
METHADONE SCREEN, URINE: NOT DETECTED
Opiate, Ur Screen: NOT DETECTED
PHENCYCLIDINE (PCP) UR S: NOT DETECTED
Tricyclic, Ur Screen: NOT DETECTED

## 2017-04-23 LAB — ACETAMINOPHEN LEVEL: Acetaminophen (Tylenol), Serum: <10 ug/mL — ABNORMAL LOW (ref 10–30)

## 2017-04-23 LAB — SALICYLATE LEVEL: Salicylate Lvl: <7 mg/dL (ref 2.8–30.0)

## 2017-04-23 LAB — POCT PREGNANCY, URINE: Preg Test, Ur: NEGATIVE

## 2017-04-23 LAB — ETHANOL: ALCOHOL ETHYL (B): 171 mg/dL — AB (ref <10)

## 2017-04-23 MED ORDER — ZIPRASIDONE MESYLATE 20 MG IM SOLR
20.0000 mg | Freq: Once | INTRAMUSCULAR | Status: AC
  Administered 2017-04-23: 20 mg via INTRAMUSCULAR

## 2017-04-23 MED ORDER — ZIPRASIDONE MESYLATE 20 MG IM SOLR
INTRAMUSCULAR | Status: AC
  Administered 2017-04-23: 20 mg via INTRAMUSCULAR
  Filled 2017-04-23: qty 20

## 2017-04-23 NOTE — ED Notes (Addendum)
Patient verbally abusive and agitated upon arrival to ED.  Patient not answering questions, yelling at staff.  Officer present at room.  MD at bedside explaining to patient process of being IVC and reasoning why.  Pt asked multiple times to sit on the bed, pt not cooperating.  Pt assisted to bed by officers.  VORB for 20mg  Geodon ordered and given by Ena Dawley, Therapist, sports.  Charge RN at bedside speaking with patient.

## 2017-04-23 NOTE — ED Triage Notes (Signed)
Patient presents to Emergency Department via [facility] with complaints of intentional overdose with suicide intent. EMS called by family.    Per EMS pt at 1 year anniversary of her son's death. History of depression. [place] called by EMS.  Suspected overdose with Topamax, trazodone, and 2 x 40oz Bud Ice.  Pt currently denies SI.  Per EMS family initiating IVC.    Pt posted on Face book "don't cry for me when the casket drops"

## 2017-04-23 NOTE — ED Notes (Signed)
Pt extremely surly, "yall pissing me off", requesting transfer to [place] for that reason,   Pt walked to toilet then dumped urine sample in to toilet

## 2017-04-23 NOTE — ED Notes (Signed)
Pt removed IV, unable to reason with pt, agitation increasing, Dr Corky Downs notified, orders received

## 2017-04-23 NOTE — ED Provider Notes (Signed)
[facility] Emergency Department Provider Note   ____________________________________________    I have reviewed the triage vital signs and the nursing notes.   HISTORY  Chief Complaint Ingestion and Suicide Attempt     HPI Kelly Byrd is a 42 y.o. female brought in for concern about depression and suicidal ideation.  Apparently the patient was locked in a bathroom and admitted to taking extra dose of her Topamax and trazodone.  She is also been drinking alcohol.  The patient does admit to being depressed because of the loss of her son 1 year ago.  She also states she put a post on social media that apparently concerned her family and caused him to call for help.  Symptoms started today.   Past Medical History:  Diagnosis Date  . Asthma   . Back pain   . GERD (gastroesophageal reflux disease)   . Hypertension   . Kidney stone   . Migraines   . Sickle cell trait [facility])     Patient Active Problem List   Diagnosis Date Noted  . GERD (gastroesophageal reflux disease) 08/05/2016  . Fibroids 09/19/2015  . Hypertension 08/29/2015  . Asthma 08/29/2015  . Back pain 08/29/2015  . 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 Anemia, iron deficiency 10/29/2014  . Menometrorrhagia 10/29/2014  . Migraine 10/27/2014    Past Surgical History:  Procedure Laterality Date  . CESAREAN SECTION     2    Prior to Admission medications   Medication Sig Start Date End Date Taking? Authorizing Provider  acetaminophen-codeine (TYLENOL #3) 300-30 MG tablet Take 1 tablet by mouth every 12 (twelve) hours as needed for moderate pain. 08/05/16   Charlott Rakes, MD  albuterol (PROVENTIL HFA;VENTOLIN HFA) 108 (90 Base) MCG/ACT inhaler Inhale 2 puffs into the lungs every 6 (six) hours as needed for wheezing or shortness of breath (wheezing). 08/05/16   Charlott Rakes, MD  amLODipine (NORVASC) 10 MG tablet Take 1 tablet (10 mg total) by mouth daily. 08/05/16    Charlott Rakes, MD  atorvastatin (LIPITOR) 20 MG tablet Take 1 tablet (20 mg total) by mouth daily. 08/09/16   Charlott Rakes, MD  butalbital-acetaminophen-caffeine (FIORICET, ESGIC) 50-325-40 MG tablet Take 1-2 tablets by mouth every 6 (six) hours as needed for headache. 09/28/16 09/28/17  Earleen Newport, MD  cephALEXin (KEFLEX) 500 MG capsule Take 1 capsule (500 mg total) by mouth 4 (four) times daily. 04/11/17   Fransico Meadow, PA-C  cyclobenzaprine (FLEXERIL) 10 MG tablet Take 1 tablet (10 mg total) by mouth 3 (three) times daily as needed for muscle spasms. 08/05/16   Charlott Rakes, MD  diazepam (VALIUM) 5 MG tablet Take 1 tablet (5 mg total) by mouth every 8 (eight) hours as needed for muscle spasms. 09/28/16   Earleen Newport, MD  diclofenac (VOLTAREN) 75 MG EC tablet Take 1 tablet (75 mg total) by mouth 2 (two) times daily. 04/11/17   Fransico Meadow, PA-C  famotidine (PEPCID) 20 MG tablet Take 1 tablet (20 mg total) by mouth 2 (two) times daily. 08/05/16   Charlott Rakes, MD  ferrous sulfate (EQL SLOW RELEASE IRON) 160 (50 Fe) MG TBCR SR tablet Take 1 tablet (160 mg total) by mouth daily. 09/28/16   Earleen Newport, MD  ferrous sulfate 325 (65 FE) MG tablet Take 1 tablet (325 mg total) by mouth 3 (three) times daily with meals. Patient not taking: Reported on 08/05/2016 10/29/14   Aldean Jewett, MD  ibuprofen (ADVIL,MOTRIN) 600 MG tablet Take 1 tablet (600 mg total) by mouth every 12 (twelve) hours as needed for mild pain or moderate pain. 08/05/16   Charlott Rakes, MD  mometasone-formoterol (DULERA) 200-5 MCG/ACT AERO Inhale 2 puffs into the lungs 2 (two) times daily. 08/05/16   Charlott Rakes, MD  montelukast (SINGULAIR) 10 MG tablet Take 1 tablet (10 mg total) by mouth at bedtime. 08/05/16   Charlott Rakes, MD  norgestrel-ethinyl estradiol (LO/OVRAL,CRYSELLE) 0.3-30 MG-MCG tablet Take 1 tablet by mouth daily. 09/28/16   Earleen Newport, MD  topiramate (TOPAMAX) 50 MG tablet  Take 1 tablet (50 mg total) by mouth 2 (two) times daily. 08/05/16   Charlott Rakes, MD     Allergies Ace inhibitors; Lisinopril; and Peanuts [peanut oil]  Family History  Problem Relation Age of Onset  . Hypertension Mother   . Hypertension Father   . Diabetes Mellitus II Maternal Grandmother   . Lupus Unknown     Social History Social History   Tobacco Use  . Smoking status: Current Every Day Smoker    Packs/day: 0.25    Types: Cigarettes  . Smokeless tobacco: Never Used  Substance Use Topics  . Alcohol use: Yes    Alcohol/week: 0.6 oz    Types: 1 Cans of beer per week    Comment: 2 x 40oz bud ice  . Drug use: No    Review of Systems  Constitutional: No dizziness Eyes: No visual changes.  ENT: No sore throat. Cardiovascular: Denies chest pain. Respiratory: Denies shortness of breath. Gastrointestinal: No abdominal pain.   Genitourinary: Negative for dysuria. Musculoskeletal: Negative for back pain. Skin: Negative for rash. Neurological: Negative for headaches    ____________________________________________   PHYSICAL EXAM:  VITAL SIGNS: ED Triage Vitals  Enc Vitals Group     BP 04/23/17 2054 (!) 141/86     Pulse Rate 04/23/17 2054 99     Resp 04/23/17 2054 16     Temp 04/23/17 2054 98 F (36.7 C)     Temp Source 04/23/17 2054 Oral     SpO2 04/23/17 2046 96 %     Weight 04/23/17 2055 104.3 kg (230 lb)     Height 04/23/17 2055 1.626 m (5\' 4" )     Head Circumference --      Peak Flow --      Pain Score 04/23/17 2054 0     Pain Loc --      Pain Edu? --      Excl. in [place]? --     Constitutional: Alert and oriented. No acute distress.  Eyes: Conjunctivae are normal.   Nose: No congestion/rhinnorhea. Mouth/Throat: Mucous membranes are moist.    Cardiovascular: Normal rate, regular rhythm. Grossly normal heart sounds.  Good peripheral circulation. Respiratory: Normal respiratory effort.  No retractions. Lungs CTAB. Gastrointestinal: Soft and  nontender. No distention.  No CVA tenderness. Genitourinary: deferred Musculoskeletal:   Warm and well perfused Neurologic:  Normal speech and language. No gross focal neurologic deficits are appreciated.  Skin:  Skin is warm, dry and intact.  No lacerations Psychiatric: Mood and affect are normal. Speech and behavior are normal.  ____________________________________________   LABS (all labs ordered are listed, but only abnormal results are displayed)  Labs Reviewed  CBC - Abnormal; Notable for the following components:      Result Value   Hemoglobin 9.3 (*)    HCT 30.4 (*)    MCV 62.6 (*)    MCH 19.1 (*)  MCHC 30.5 (*)    RDW 22.1 (*)    Platelets 542 (*)    All other components within normal limits  COMPREHENSIVE METABOLIC PANEL - Abnormal; Notable for the following components:   Potassium 3.3 (*)    All other components within normal limits  ETHANOL - Abnormal; Notable for the following components:   Alcohol, Ethyl (B) 171 (*)    All other components within normal limits  URINE DRUG SCREEN, QUALITATIVE ([facility] ONLY) - Abnormal; Notable for the following components:   Cocaine Metabolite,Ur [place] POSITIVE (*)    All other components within normal limits  ACETAMINOPHEN LEVEL - Abnormal; Notable for the following components:   Acetaminophen (Tylenol), Serum <10 (*)    All other components within normal limits  SALICYLATE LEVEL  POC URINE PREG, ED  POC URINE PREG, ED  POCT PREGNANCY, URINE   ____________________________________________  EKG  ED ECG REPORT I, Lavonia Drafts, the attending physician, personally viewed and interpreted this ECG.  Date: 04/23/2017   Rhythm: Sinus tachycardia QRS Axis: normal Intervals: normal ST/T Wave abnormalities: normal Narrative Interpretation: no evidence of acute ischemia  ____________________________________________  RADIOLOGY  None ____________________________________________   PROCEDURES  Procedure(s) performed:  No  Procedures   Critical Care performed:yes  CRITICAL CARE Performed by: Lavonia Drafts   Total critical care time: 30 minutes  Critical care time was exclusive of separately billable procedures and treating other patients.  Critical care was necessary to treat or prevent imminent or life-threatening deterioration.  Critical care was time spent personally by me on the following activities: development of treatment plan with patient and/or surrogate as well as nursing, discussions with consultants, evaluation of patient's response to treatment, examination of patient, obtaining history from patient or surrogate, ordering and performing treatments and interventions, ordering and review of laboratory studies, ordering and review of radiographic studies, pulse oximetry and re-evaluation of patient's condition.  ____________________________________________   INITIAL IMPRESSION / ASSESSMENT AND PLAN / ED COURSE  Pertinent labs & imaging results that were available during my care of the patient were reviewed by me and considered in my medical decision making (see chart for details).  Patient presents with depression, possible intentional drug overdose, with concerning message on social media and EtOH abuse.  I decided to involuntarily commit the patient to ensure her safety pending psychiatry evaluation.  Patient became very agitated and angry when we told her she was not allowed to leave.  Verbal de-escalation attempted repeatedly however the patient became increasingly threatening to staff and so the decision was made to give 20 mg of IM Geodon.  Pending psychiatry and TTS evaluation.  Patient is medically cleared    ____________________________________________   FINAL CLINICAL IMPRESSION(S) / ED DIAGNOSES  Final diagnoses:  Suicidal behavior with attempted self-injury [facility])        Note:  This document was prepared using Dragon voice recognition software and may include  unintentional dictation errors.    Lavonia Drafts, MD 04/23/17 2245

## 2017-04-23 NOTE — ED Notes (Signed)
[place] called: Kelly Byrd, 72min call duration, updated meds, VS, DOB and pt history  reccemends: benzodiazepines if siezures, cardiac monitoring, and IV fluids, regular VS  Possible CNS depression with ETOH and trazodone  Pt admits to 100mg  trazodone and 100mg  Topamax

## 2017-04-24 ENCOUNTER — Encounter: Payer: Self-pay | Admitting: Behavioral Health

## 2017-04-24 NOTE — ED Notes (Signed)
Patient resting quietly in room. No noted distress or abnormal behaviors noted. Will continue 15 minute checks and observation by security camera for safety. 

## 2017-04-24 NOTE — ED Notes (Signed)
Per Network engineer pt will be discharged today. IVC will be rescinded.

## 2017-04-24 NOTE — Discharge Instructions (Signed)
psychiatrist feels you're okay to return home. Please continue your medicines. Please follow-up with your primary care doctor. Please follow-up with [facility] ,  you can walk in there. They can help you with your depression as well as your primary care doctor. Please return here for any further thoughts of hurting your self

## 2017-04-24 NOTE — ED Notes (Signed)
Pt calm and cooperative with staff. Pt denies SI/HI and AVH. Pt wanting to rest after setting into unit. Maintained on 15 minute checks and observation by security camera for safety.

## 2017-04-24 NOTE — ED Notes (Signed)

## 2017-04-24 NOTE — ED Notes (Signed)
Pt dressing for discharge. Pt's husband is waiting in the lobby to give her a ride. All belongings returned to patient. Pt has signed for discharge paperwork. Prescriptions return to patient from pharmacy. Maintained on 15 minute checks and observation by security camera for safety.

## 2017-04-24 NOTE — ED Provider Notes (Addendum)
patient is seen by psychiatry. They have discharged her and feel she can survive outpatient with outpatient resources. I will refer her to her primary care doctor and [place]. He will tell her she can come back at any time.   Nena Polio, MD 04/24/17 1354    Nena Polio, MD 04/24/17 [phone]

## 2017-04-24 NOTE — ED Notes (Signed)
Pt IVC rescinded.Pt discharged to lobby with husband. All belongings returned to patient. Medications returned from pharmacy. Pt denies SI/HI and AVH.

## 2017-04-24 NOTE — ED Notes (Signed)
Pt visiting with husband in [place]. Pt is calm. Maintained on 15 minute checks and observation by security camera for safety.

## 2017-04-24 NOTE — ED Provider Notes (Addendum)
-----------------------------------------   3:15 AM on 04/24/2017 -----------------------------------------  Patient is still sleeping.  We will continue to monitor in the emergency department.  Remains under IVC.  Once the patient is awake and alert we will likely transfer to the [facility] until psychiatry evaluation could be performed.   Harvest Dark, MD 04/24/17 0315  ----------------------------------------- 6:36 AM on 04/24/2017 -----------------------------------------  Patient has been awake, talking, ambulated to the restroom.  I believe the patient is safe to go to the [facility] at this time to await psychiatric evaluation/disposition.    Harvest Dark, MD 04/24/17 [phone]

## 2017-04-24 NOTE — BH Assessment (Signed)
TTS came to do assessment pt's nurse reports that pt was given 20mg  Geodon and is currently sleeping. Will try again when pt is alert and able to participate in assessment.

## 2017-04-24 NOTE — BH Assessment (Signed)
Assessment Note  Kelly Byrd is an 42 y.o. female who presents to the ER due to an intentional ingesting of medications. Per the report of the patient, her husband called 911 and she doesn't know why. Per report from ER staff, upon arrival she was belligerent and verbally aggressive. She was giving medications to help with agitation.  Patient states, she was upset due to the anniversary of the "murder" of her son. Patient reports of having a suicide attempt when she was a teenager. The cause was due to "family problems." It was an intentional overdose.   During the interview, the patient was calm, cooperative and pleasant. She was able to provide appropriate answers to the questions. Throughout the interview denied SI/HI and AV/H. When asked the intentions of her taking the medications, patient replied, "I don't know."   Diagnosis: Depression  Past Medical History:  Past Medical History:  Diagnosis Date  . Asthma   . Back pain   . GERD (gastroesophageal reflux disease)   . Hypertension   . Kidney stone   . Migraines   . Sickle cell trait [facility])     Past Surgical History:  Procedure Laterality Date  . CESAREAN SECTION     2    Family History:  Family History  Problem Relation Age of Onset  . Hypertension Mother   . Hypertension Father   . Diabetes Mellitus II Maternal Grandmother   . Lupus Unknown     Social History:  reports that she has been smoking cigarettes.  She has been smoking about 0.25 packs per day. she has never used smokeless tobacco. She reports that she drinks about 0.6 oz of alcohol per week. She reports that she does not use drugs.  Additional Social History:  Alcohol / Drug Use Pain Medications: See PTA Prescriptions: See PTA Over the Counter: See PTA History of alcohol / drug use?: No history of alcohol / drug abuse Longest period of sobriety (when/how long): Reports of no past or current use Negative Consequences of Use: (n/a) Withdrawal Symptoms:  (n/a)  CIWA: CIWA-Ar BP: (!) 141/87 Pulse Rate: 86 COWS:    Allergies:  Allergies  Allergen Reactions  . Ace Inhibitors Swelling  . Lisinopril   . Peanuts [Peanut Oil]     Home Medications:  (Not in a hospital admission)  OB/GYN Status:  Patient's last menstrual period was 04/11/2017.  General Assessment Data Location of Assessment: [facility] ED TTS Assessment: In system Is this a Tele or Face-to-Face Assessment?: Face-to-Face Is this an Initial Assessment or a Re-assessment for this encounter?: Initial Assessment Marital status: Married Is patient pregnant?: No Pregnancy Status: No Living Arrangements: Spouse/significant other Can pt return to current living arrangement?: Yes Admission Status: Involuntary Is patient capable of signing voluntary admission?: No(Under IVC) Referral Source: Other Insurance type: Reports of none  Medical Screening Exam ([place]) Medical Exam completed: Yes  Crisis Care Plan Living Arrangements: Spouse/significant other Legal Guardian: Other:(Reports of none) Name of Psychiatrist: Reports of none Name of Therapist: Reports of none  Education Status Is patient currently in school?: No Current Grade: n/a Highest grade of school patient has completed: n/a Name of school: n/a Contact person: n/a  Risk to self with the past 6 months Suicidal Ideation: No-Not Currently/Within Last 6 Months Has patient been a risk to self within the past 6 months prior to admission? : No Suicidal Intent: No-Not Currently/Within Last 6 Months Has patient had any suicidal intent within the past 6 months prior to  admission? : Yes Is patient at risk for suicide?: Yes Suicidal Plan?: No-Not Currently/Within Last 6 Months Has patient had any suicidal plan within the past 6 months prior to admission? : Yes Access to Means: Yes Specify Access to Suicidal Means: Overdose on medication What has been your use of drugs/alcohol within the last 12 months?:  Reports of none Previous Attempts/Gestures: Yes How many times?: 2 Other Self Harm Risks: Reports of none Triggers for Past Attempts: Family contact, Anniversary Intentional Self Injurious Behavior: None Family Suicide History: No Recent stressful life event(s): Loss (Comment), Conflict (Comment), Other (Comment) Persecutory voices/beliefs?: No Depression: Yes Depression Symptoms: Feeling angry/irritable, Isolating Substance abuse history and/or treatment for substance abuse?: No Suicide prevention information given to non-admitted patients: Not applicable  Risk to Others within the past 6 months Homicidal Ideation: No Does patient have any lifetime risk of violence toward others beyond the six months prior to admission? : No Thoughts of Harm to Others: No Current Homicidal Intent: No Current Homicidal Plan: No Access to Homicidal Means: No Identified Victim: Reports of none History of harm to others?: No Assessment of Violence: None Noted Violent Behavior Description: Reports of none Does patient have access to weapons?: No Criminal Charges Pending?: No Does patient have a court date: No Is patient on probation?: No  Psychosis Hallucinations: None noted Delusions: None noted  Mental Status Report Appearance/Hygiene: Unremarkable, In scrubs Eye Contact: Good Motor Activity: Freedom of movement, Unremarkable Speech: Logical/coherent, Unremarkable Level of Consciousness: Alert Mood: Irritable, Anxious, Depressed Affect: Appropriate to circumstance, Depressed, Anxious Anxiety Level: Minimal Thought Processes: Coherent, Relevant Judgement: Unimpaired Orientation: Person, Place, Time, Situation, Appropriate for developmental age Obsessive Compulsive Thoughts/Behaviors: Minimal  Cognitive Functioning Concentration: Normal Memory: Recent Intact, Remote Intact IQ: Average Insight: Fair Impulse Control: Fair Appetite: Good Weight Loss: 0 Weight Gain: 0 Sleep:  Decreased Total Hours of Sleep: 6 Vegetative Symptoms: None  ADLScreening [facility] Assessment Services) Patient's cognitive ability adequate to safely complete daily activities?: Yes Patient able to express need for assistance with ADLs?: Yes Independently performs ADLs?: Yes (appropriate for developmental age)  Prior Inpatient Therapy Prior Inpatient Therapy: Yes Prior Therapy Dates: "When I was a teenager" Prior Therapy Facilty/Provider(s): [facility] "When they had an adolescent unit." Reason for Treatment: Depression  Prior Outpatient Therapy Prior Outpatient Therapy: Yes Prior Therapy Dates: Current Prior Therapy Facilty/Provider(s): [facility] Reason for Treatment: Depression Does patient have an ACCT team?: No Does patient have Intensive In-House Services?  : No Does patient have [facility] services? : No Does patient have P4CC services?: No  ADL Screening (condition at time of admission) Patient's cognitive ability adequate to safely complete daily activities?: Yes Is the patient deaf or have difficulty hearing?: No Does the patient have difficulty seeing, even when wearing glasses/contacts?: No Does the patient have difficulty concentrating, remembering, or making decisions?: No Patient able to express need for assistance with ADLs?: Yes Does the patient have difficulty dressing or bathing?: No Independently performs ADLs?: Yes (appropriate for developmental age) Does the patient have difficulty walking or climbing stairs?: No Weakness of Legs: None Weakness of Arms/Hands: None  Home Assistive Devices/Equipment Home Assistive Devices/Equipment: None  Therapy Consults (therapy consults require a physician order) PT Evaluation Needed: No OT Evalulation Needed: No SLP Evaluation Needed: No Abuse/Neglect Assessment (Assessment to be complete while patient is alone) Abuse/Neglect Assessment Can Be Completed: Yes Physical Abuse: Denies Verbal Abuse: Denies Sexual Abuse:  Denies Exploitation of patient/patient's resources: Denies  Additional Information 1:1 In Past 12 Months?: No CIRT Risk: No Elopement Risk: No Does patient have medical clearance?: Yes  Child/Adolescent Assessment Running Away Risk: Denies(Patient is an adult)  Disposition:  Disposition Initial Assessment Completed for this Encounter: Yes Disposition of Patient: Outpatient treatment(Per [facility])  On Site Evaluation by:   Reviewed with Physician:    Gunnar Fusi MS, LCAS, LPC, [place], CCSI Therapeutic Triage Specialist 04/24/2017 7:21 PM

## 2017-04-24 NOTE — ED Notes (Signed)
[employer] called to get update on patient, they also closed the case.

## 2017-04-24 NOTE — ED Notes (Signed)
Patient is awake and alert at this time, given gingerale and orange juice. Informed her that she would be moving to another area of the ED soon. Denies any needs at this time.  Respirations equal and unlabored.

## 2017-05-17 ENCOUNTER — Encounter: Payer: Self-pay | Admitting: Family Medicine

## 2017-05-18 ENCOUNTER — Emergency Department: Payer: Self-pay

## 2017-05-18 ENCOUNTER — Emergency Department
Admission: EM | Admit: 2017-05-18 | Discharge: 2017-05-18 | Disposition: A | Discharge status: Home / Self Care | Payer: Self-pay | Attending: Student in an Organized Health Care Education/Training Program | Admitting: Student in an Organized Health Care Education/Training Program

## 2017-05-18 ENCOUNTER — Encounter: Payer: Self-pay | Admitting: Emergency Medicine

## 2017-05-18 ENCOUNTER — Other Ambulatory Visit: Payer: Self-pay

## 2017-05-18 DIAGNOSIS — M5416 Radiculopathy, lumbar region: Secondary | ICD-10-CM | POA: Insufficient documentation

## 2017-05-18 DIAGNOSIS — M5137 Other intervertebral disc degeneration, lumbosacral region: Secondary | ICD-10-CM

## 2017-05-18 DIAGNOSIS — F17210 Nicotine dependence, cigarettes, uncomplicated: Secondary | ICD-10-CM | POA: Insufficient documentation

## 2017-05-18 DIAGNOSIS — D573 Sickle-cell trait: Secondary | ICD-10-CM | POA: Insufficient documentation

## 2017-05-18 DIAGNOSIS — M5136 Other intervertebral disc degeneration, lumbar region: Secondary | ICD-10-CM | POA: Insufficient documentation

## 2017-05-18 DIAGNOSIS — I10 Essential (primary) hypertension: Secondary | ICD-10-CM

## 2017-05-18 DIAGNOSIS — J45909 Unspecified asthma, uncomplicated: Secondary | ICD-10-CM | POA: Insufficient documentation

## 2017-05-18 DIAGNOSIS — Z79899 Other long term (current) drug therapy: Secondary | ICD-10-CM | POA: Insufficient documentation

## 2017-05-18 MED ORDER — DIAZEPAM 5 MG PO TABS
5.0000 mg | ORAL_TABLET | Freq: Once | ORAL | Status: AC
  Administered 2017-05-18: 5 mg via ORAL
  Filled 2017-05-18: qty 1

## 2017-05-18 MED ORDER — CYCLOBENZAPRINE HCL 10 MG PO TABS: 10.0000 mg | ORAL_TABLET | Freq: Three times a day (TID) | ORAL | 0 refills | Status: DC | PRN

## 2017-05-18 MED ORDER — AMLODIPINE BESYLATE 10 MG PO TABS: 10.0000 mg | ORAL_TABLET | Freq: Every day | ORAL | 0 refills | Status: DC

## 2017-05-18 MED ORDER — OXYCODONE HCL 5 MG PO TABS
10.0000 mg | ORAL_TABLET | Freq: Once | ORAL | Status: AC
  Administered 2017-05-18: 10 mg via ORAL
  Filled 2017-05-18: qty 2

## 2017-05-18 MED ORDER — KETOROLAC TROMETHAMINE 30 MG/ML IJ SOLN
30.0000 mg | Freq: Once | INTRAMUSCULAR | Status: AC
  Administered 2017-05-18: 30 mg via INTRAMUSCULAR
  Filled 2017-05-18: qty 1

## 2017-05-18 MED ORDER — OXYCODONE-ACETAMINOPHEN 5-325 MG PO TABS: 1.0000 | ORAL_TABLET | Freq: Three times a day (TID) | ORAL | 0 refills | Status: DC | PRN

## 2017-05-18 MED ORDER — PREDNISONE 10 MG (21) PO TBPK: ORAL_TABLET | ORAL | 0 refills | Status: DC

## 2017-05-18 NOTE — ED Notes (Signed)
Left hip and left leg pain since last Thursday, was seen at another facility for same and states the meds they gave are not helping.  Steroids, muscle relaxer and vicodin.

## 2017-05-18 NOTE — ED Triage Notes (Signed)
Pt states she was seen at another hospital recently for c/o left hip and leg pain, states the medicine they gave her is not working, pt tearful in triage.

## 2017-05-18 NOTE — ED Provider Notes (Signed)
[facility] Emergency Department Provider Note ____________________________________________  Time seen: Approximately 10:33 AM  I have reviewed the triage vital signs and the nursing notes.  HISTORY  Chief Complaint Leg Pain and Back Pain   HPI Kelly Byrd is a 42 y.o. female who presents to the emergency department for treatment and evaluation of back pain. She denies injury. She states symptoms started on 05/10/17 while she was out of town. She went to the emergency room and was given prednisone, vicodin, and muscle relaxers which have not helped at all. Pain is worse today. She has not taken any of the medications this morning.  Past Medical History:  Diagnosis Date  . Asthma   . Back pain   . GERD (gastroesophageal reflux disease)   . Hypertension   . Kidney stone   . Migraines   . Sickle cell trait [facility])     Patient Active Problem List   Diagnosis Date Noted  . GERD (gastroesophageal reflux disease) 08/05/2016  . Fibroids 09/19/2015  . Hypertension 08/29/2015  . Asthma 08/29/2015  . Back pain 08/29/2015  . 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 Anemia, iron deficiency 10/29/2014  . Menometrorrhagia 10/29/2014  . Migraine 10/27/2014    Past Surgical History:  Procedure Laterality Date  . CESAREAN SECTION     2    Prior to Admission medications   Medication Sig Start Date End Date Taking? Authorizing Provider  albuterol (PROVENTIL HFA;VENTOLIN HFA) 108 (90 Base) MCG/ACT inhaler Inhale 2 puffs into the lungs every 6 (six) hours as needed for wheezing or shortness of breath (wheezing). 08/05/16   Charlott Rakes, MD  amLODipine (NORVASC) 10 MG tablet Take 1 tablet (10 mg total) by mouth daily. 05/18/17   Jayley Hustead, Johnette Abraham B, FNP  atorvastatin (LIPITOR) 20 MG tablet Take 1 tablet (20 mg total) by mouth daily. Patient not taking: Reported on 04/24/2017 08/09/16   Charlott Rakes, MD  butalbital-acetaminophen-caffeine (FIORICET, ESGIC)  50-325-40 MG tablet Take 1-2 tablets by mouth every 6 (six) hours as needed for headache. Patient not taking: Reported on 04/24/2017 09/28/16 09/28/17  Earleen Newport, MD  cephALEXin (KEFLEX) 500 MG capsule Take 1 capsule (500 mg total) by mouth 4 (four) times daily. Patient not taking: Reported on 04/24/2017 04/11/17   Fransico Meadow, PA-C  cyclobenzaprine (FLEXERIL) 10 MG tablet Take 1 tablet (10 mg total) by mouth 3 (three) times daily as needed for muscle spasms. 05/18/17   Hae Ahlers B, FNP  diazepam (VALIUM) 5 MG tablet Take 1 tablet (5 mg total) by mouth every 8 (eight) hours as needed for muscle spasms. Patient not taking: Reported on 04/24/2017 09/28/16   Earleen Newport, MD  diclofenac (VOLTAREN) 75 MG EC tablet Take 1 tablet (75 mg total) by mouth 2 (two) times daily. 04/11/17   Fransico Meadow, PA-C  famotidine (PEPCID) 20 MG tablet Take 1 tablet (20 mg total) by mouth 2 (two) times daily. 08/05/16   Charlott Rakes, MD  ferrous sulfate (EQL SLOW RELEASE IRON) 160 (50 Fe) MG TBCR SR tablet Take 1 tablet (160 mg total) by mouth daily. 09/28/16   Earleen Newport, MD  ferrous sulfate 325 (65 FE) MG tablet Take 1 tablet (325 mg total) by mouth 3 (three) times daily with meals. 10/29/14   Aldean Jewett, MD  ibuprofen (ADVIL,MOTRIN) 600 MG tablet Take 1 tablet (600 mg total) by mouth every 12 (twelve) hours as needed for mild pain or moderate pain. Patient not taking: Reported  on 04/24/2017 08/05/16   Charlott Rakes, MD  mometasone-formoterol (DULERA) 200-5 MCG/ACT AERO Inhale 2 puffs into the lungs 2 (two) times daily. Patient not taking: Reported on 04/24/2017 08/05/16   Charlott Rakes, MD  montelukast (SINGULAIR) 10 MG tablet Take 1 tablet (10 mg total) by mouth at bedtime. 08/05/16   Charlott Rakes, MD  norgestrel-ethinyl estradiol (LO/OVRAL,CRYSELLE) 0.3-30 MG-MCG tablet Take 1 tablet by mouth daily. Patient not taking: Reported on 04/24/2017 09/28/16   Earleen Newport, MD   oxyCODONE-acetaminophen (PERCOCET) 5-325 MG tablet Take 1 tablet by mouth every 8 (eight) hours as needed for severe pain. 05/18/17   Lecia Esperanza B, FNP  predniSONE (STERAPRED UNI-PAK 21 TAB) 10 MG (21) TBPK tablet Take 6 tablets on day 1 Take 5 tablets on day 2 Take 4 tablets on day 3 Take 3 tablets on day 4 Take 2 tablets on day 5 Take 1 tablet on day 6 05/18/17   Trenace Coughlin B, FNP  topiramate (TOPAMAX) 50 MG tablet Take 1 tablet (50 mg total) by mouth 2 (two) times daily. 08/05/16   Charlott Rakes, MD  traZODone (DESYREL) 50 MG tablet Take 50 mg by mouth at bedtime.    [provider]    Allergies Ace inhibitors; Lisinopril; and Peanuts [peanut oil]  Family History  Problem Relation Age of Onset  . Hypertension Mother   . Hypertension Father   . Diabetes Mellitus II Maternal Grandmother   . Lupus Unknown     Social History Social History   Tobacco Use  . Smoking status: Current Every Day Smoker    Packs/day: 0.25    Types: Cigarettes  . Smokeless tobacco: Never Used  Substance Use Topics  . Alcohol use: Yes    Alcohol/week: 0.6 oz    Types: 1 Cans of beer per week    Comment: 2 x 40oz bud ice  . Drug use: No    Review of Systems Constitutional: Well appearing. Respiratory: Negative for dyspnea. Cardiovascular: Negative for change in skin temperature or color. Musculoskeletal:   Negative for chronic steroid use   Negative for trauma in the presence of osteoporosis  Negative for age over 67 and trauma.  Negative for constitutional symptoms, or history of cancer   Negative for pain worse at night. Skin: Negative for rash, lesion, or wound.  Genitourinary: Negative for urinary retention. Rectal: Negative for fecal incontinence or new onset constipation/bowel habit changes. Hematological/Immunilogical: Negative for immunosuppression, IV drug use, or fever Neurological: Positive for burning, tingling, numb, electric, radiating pain in the left lower  extremity.                        Negative for saddle anesthesia.                        Negative for focal neurologic deficit, progressive or disabling symptoms             Negative for saddle anesthesia. ____________________________________________   PHYSICAL EXAM:  VITAL SIGNS: ED Triage Vitals  Enc Vitals Group     BP 05/18/17 1019 (!) 177/101     Pulse Rate 05/18/17 1019 79     Resp 05/18/17 1019 20     Temp 05/18/17 1019 98.1 F (36.7 C)     Temp Source 05/18/17 1019 Oral     SpO2 05/18/17 1019 99 %     Weight 05/18/17 1001 230 lb (104.3 kg)     Height  05/18/17 1001 5\' 4"  (1.626 m)     Head Circumference --      Peak Flow --      Pain Score 05/18/17 1001 10     Pain Loc --      Pain Edu? --      Excl. in [place]? --     Constitutional: Alert and oriented. Well appearing and in no acute distress. Eyes: Conjunctivae are clear without discharge or drainage.  Head: Atraumatic. Neck: Full, active range of motion. Respiratory: Respirations even and unlabored. Musculoskeletal: Limited ROM of the back and left lower extremity, Strength 5/5 of the lower extremities as tested. Neurologic: Reflexes of the lower extremities are 2+. Negative straight leg raise on the left side. Skin: Atraumatic.  Psychiatric: Behavior and affect are normal.  ____________________________________________   LABS (all labs ordered are listed, but only abnormal results are displayed)  Labs Reviewed - No data to display ____________________________________________  RADIOLOGY  CT lumbar spine:  IMPRESSION:  1. Mild retrolisthesis and lumbar disc degeneration at L5-S1 with  broad-based partially calcified posterior disc protrusion with mild  to moderate stenosis that appears to most affect the left lateral  recess. Query left S1 radiculitis.  2. Comparatively mild lumbar spine degeneration elsewhere with no  other convincing neural impingement.  3. Moderately advanced lower thoracic facet  degeneration.  4. No acute osseous abnormality.    ____________________________________________   PROCEDURES  Procedure(s) performed:  Procedures ____________________________________________   INITIAL IMPRESSION / ASSESSMENT AND PLAN / ED COURSE  Kelly Byrd is a 42 y.o. female who presents to the emergency department for treatment and evaluation of lower back pain that radiates into the left lower extremity. She states that she went to an ER when out of town and took prednisone, muscle relaxer, and norco which have not helped.  See ED Course notes.  CT image of the lumbar spine indicates radiculitis at S1 and degenerative disc disease at L5-S1.  Lumbar spine images negative for acute bony abnormality.   Medications  ketorolac (TORADOL) 30 MG/ML injection 30 mg (30 mg Intramuscular Given 05/18/17 1045)  diazepam (VALIUM) tablet 5 mg (5 mg Oral Given 05/18/17 1044)  oxyCODONE (Oxy IR/ROXICODONE) immediate release tablet 10 mg (10 mg Oral Given 05/18/17 1310)    ED Discharge Orders        Ordered    predniSONE (STERAPRED UNI-PAK 21 TAB) 10 MG (21) TBPK tablet     05/18/17 1312    cyclobenzaprine (FLEXERIL) 10 MG tablet  3 times daily PRN     05/18/17 1312    oxyCODONE-acetaminophen (PERCOCET) 5-325 MG tablet  Every 8 hours PRN     05/18/17 1312    amLODipine (NORVASC) 10 MG tablet  Daily    Comments:  Must have office visit for refills   05/18/17 1312       Pertinent labs & imaging results that were available during my care of the patient were reviewed by me and considered in my medical decision making (see chart for details).  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    Victorino Dike, FNP 05/18/17 1326    Merlyn Lot, MD 05/18/17 1421

## 2017-05-31 ENCOUNTER — Encounter: Payer: Self-pay | Admitting: Family Medicine

## 2017-05-31 ENCOUNTER — Ambulatory Visit: Payer: Self-pay | Attending: Family Medicine | Admitting: Family Medicine

## 2017-05-31 VITALS — BP 119/81 | HR 95 | Temp 97.9°F | Ht 64.0 in | Wt 239.8 lb

## 2017-05-31 DIAGNOSIS — K219 Gastro-esophageal reflux disease without esophagitis: Secondary | ICD-10-CM | POA: Insufficient documentation

## 2017-05-31 DIAGNOSIS — G43809 Other migraine, not intractable, without status migrainosus: Secondary | ICD-10-CM | POA: Insufficient documentation

## 2017-05-31 DIAGNOSIS — D259 Leiomyoma of uterus, unspecified: Secondary | ICD-10-CM | POA: Insufficient documentation

## 2017-05-31 DIAGNOSIS — Z79899 Other long term (current) drug therapy: Secondary | ICD-10-CM | POA: Insufficient documentation

## 2017-05-31 DIAGNOSIS — D573 Sickle-cell trait: Secondary | ICD-10-CM | POA: Insufficient documentation

## 2017-05-31 DIAGNOSIS — N920 Excessive and frequent menstruation with regular cycle: Secondary | ICD-10-CM | POA: Insufficient documentation

## 2017-05-31 DIAGNOSIS — Z87442 Personal history of urinary calculi: Secondary | ICD-10-CM | POA: Insufficient documentation

## 2017-05-31 DIAGNOSIS — J4520 Mild intermittent asthma, uncomplicated: Secondary | ICD-10-CM | POA: Insufficient documentation

## 2017-05-31 DIAGNOSIS — M5416 Radiculopathy, lumbar region: Secondary | ICD-10-CM

## 2017-05-31 DIAGNOSIS — I10 Essential (primary) hypertension: Secondary | ICD-10-CM | POA: Insufficient documentation

## 2017-05-31 DIAGNOSIS — E785 Hyperlipidemia, unspecified: Secondary | ICD-10-CM | POA: Insufficient documentation

## 2017-05-31 DIAGNOSIS — M79605 Pain in left leg: Secondary | ICD-10-CM | POA: Insufficient documentation

## 2017-05-31 DIAGNOSIS — M545 Low back pain: Secondary | ICD-10-CM | POA: Insufficient documentation

## 2017-05-31 DIAGNOSIS — M5117 Intervertebral disc disorders with radiculopathy, lumbosacral region: Secondary | ICD-10-CM | POA: Insufficient documentation

## 2017-05-31 DIAGNOSIS — M5116 Intervertebral disc disorders with radiculopathy, lumbar region: Secondary | ICD-10-CM | POA: Insufficient documentation

## 2017-05-31 DIAGNOSIS — E7800 Pure hypercholesterolemia, unspecified: Secondary | ICD-10-CM | POA: Insufficient documentation

## 2017-05-31 DIAGNOSIS — Z888 Allergy status to other drugs, medicaments and biological substances status: Secondary | ICD-10-CM | POA: Insufficient documentation

## 2017-05-31 DIAGNOSIS — M93001 Unspecified slipped upper femoral epiphysis (nontraumatic), right hip: Secondary | ICD-10-CM

## 2017-05-31 DIAGNOSIS — Z9119 Patient's noncompliance with other medical treatment and regimen: Secondary | ICD-10-CM | POA: Insufficient documentation

## 2017-05-31 MED ORDER — TOPIRAMATE 50 MG PO TABS: 50.0000 mg | ORAL_TABLET | Freq: Two times a day (BID) | ORAL | 3 refills | Status: DC

## 2017-05-31 MED ORDER — ALBUTEROL SULFATE HFA 108 (90 BASE) MCG/ACT IN AERS: 2.0000 | INHALATION_SPRAY | Freq: Four times a day (QID) | RESPIRATORY_TRACT | 3 refills | Status: DC | PRN

## 2017-05-31 MED ORDER — MELOXICAM 7.5 MG PO TABS: 7.5000 mg | ORAL_TABLET | Freq: Every day | ORAL | 1 refills | Status: DC

## 2017-05-31 MED ORDER — GABAPENTIN 300 MG PO CAPS: 300.0000 mg | ORAL_CAPSULE | Freq: Two times a day (BID) | ORAL | 3 refills | Status: DC

## 2017-05-31 MED ORDER — AMLODIPINE BESYLATE 10 MG PO TABS: 10.0000 mg | ORAL_TABLET | Freq: Every day | ORAL | 3 refills | Status: DC

## 2017-05-31 MED ORDER — MOMETASONE FURO-FORMOTEROL FUM 200-5 MCG/ACT IN AERO: 2.0000 | INHALATION_SPRAY | Freq: Two times a day (BID) | RESPIRATORY_TRACT | 3 refills | Status: DC

## 2017-05-31 MED ORDER — MONTELUKAST SODIUM 10 MG PO TABS: 10.0000 mg | ORAL_TABLET | Freq: Every day | ORAL | 3 refills | Status: DC

## 2017-05-31 MED ORDER — ATORVASTATIN CALCIUM 20 MG PO TABS: 20.0000 mg | ORAL_TABLET | Freq: Every day | ORAL | 3 refills | Status: DC

## 2017-05-31 MED ORDER — TRAMADOL HCL 50 MG PO TABS: 50.0000 mg | ORAL_TABLET | Freq: Two times a day (BID) | ORAL | 1 refills | Status: DC | PRN

## 2017-05-31 MED ORDER — KETOROLAC TROMETHAMINE 60 MG/2ML IM SOLN
60.0000 mg | Freq: Once | INTRAMUSCULAR | Status: AC
  Administered 2017-05-31: 60 mg via INTRAMUSCULAR

## 2017-05-31 MED ORDER — CYCLOBENZAPRINE HCL 10 MG PO TABS: 10.0000 mg | ORAL_TABLET | Freq: Three times a day (TID) | ORAL | 1 refills | Status: DC | PRN

## 2017-05-31 MED ORDER — FERROUS SULFATE 325 (65 FE) MG PO TABS: 325.0000 mg | ORAL_TABLET | Freq: Three times a day (TID) | ORAL | 3 refills | Status: DC

## 2017-05-31 MED FILL — MELOXICAM 7.5 MG TABLET: 7.5 | 30 days supply | Qty: 30 | Fill #0

## 2017-05-31 MED FILL — ATORVASTATIN 20 MG TABLET: 20 | 30 days supply | Qty: 30 | Fill #0

## 2017-05-31 MED FILL — GABAPENTIN 300 MG CAPSULE: 300 | 30 days supply | Qty: 60 | Fill #0

## 2017-05-31 MED FILL — CYCLOBENZAPRINE 10 MG TAB: 10 | 20 days supply | Qty: 60 | Fill #0

## 2017-05-31 MED FILL — ALBUTEROL SULFATE HFA 108 (: 108 (90 BAS | 25 days supply | Qty: 18 | Fill #0

## 2017-05-31 MED FILL — DULERA 200 MCG/5 MCG INH: 200-5 | 30 days supply | Qty: 13 | Fill #0

## 2017-05-31 MED FILL — TOPIRAMATE 50 MG TABLET: 50 | 30 days supply | Qty: 60 | Fill #0

## 2017-05-31 MED FILL — FERROUS SULFATE 325 MG TAB: 325 (65 FE) | 30 days supply | Qty: 90 | Fill #0

## 2017-05-31 MED FILL — MONTELUKAST SOD 10 MG TAB: 10 | 30 days supply | Qty: 30 | Fill #0

## 2017-05-31 MED FILL — AMLODIPINE BESYLATE 10 MG T: 10 | 30 days supply | Qty: 30 | Fill #0

## 2017-05-31 NOTE — Patient Instructions (Signed)

## 2017-05-31 NOTE — Progress Notes (Signed)
Subjective:  Patient ID: Kelly Byrd, female    DOB: 1975-07-25  Age: 42 y.o. MRN: [id number]  CC: Hospitalization Follow-up   HPI Kelly Byrd is a 42 year old female with a history of hypertension, asthma, migraine, anemia (secondary to menorrhagia from fibroids) who comes into the clinic for follow-up visit from the ED where she was seen at [facility] on 05/18/17 for low back pain and left leg pain and she was prescribed Flexeril, prednisone and Percocet.  Imaging of the lumbar spine which was performed revealed:  CT of the lumbar spine without contrast: IMPRESSION: 1. Mild retrolisthesis and lumbar disc degeneration at L5-S1 with broad-based partially calcified posterior disc protrusion with mild to moderate stenosis that appears to most affect the left lateral recess. Query left S1 radiculitis. 2. Comparatively mild lumbar spine degeneration elsewhere with no other convincing neural impingement. 3. Moderately advanced lower thoracic facet degeneration. 4.  No acute osseous abnormality.  She informs me her back pain is at a 5/10 at this time but she has left leg throbbing 9/10, numbness of her left foot except her left big toe and has run out of her Percocet.  She was unable to obtain the prednisone prescription due to cost.  Pain increases with prolonged sitting. She denies loss of sphincteric function.  Doing well on her antihypertensive and her migraine medications with no recent migraine flares. Asthma is controlled on her current regimen and she is requesting refills of her chronic medications.  Past Medical History:  Diagnosis Date  . Asthma   . Back pain   . GERD (gastroesophageal reflux disease)   . Hypertension   . Kidney stone   . Migraines   . Sickle cell trait [facility])     Past Surgical History:  Procedure Laterality Date  . CESAREAN SECTION     2    Allergies  Allergen Reactions  . Ace Inhibitors Swelling  . Lisinopril   .  Peanuts [Peanut Oil]      Outpatient Medications Prior to Visit  Medication Sig Dispense Refill  . albuterol (PROVENTIL HFA;VENTOLIN HFA) 108 (90 Base) MCG/ACT inhaler Inhale 2 puffs into the lungs every 6 (six) hours as needed for wheezing or shortness of breath (wheezing). 1 Inhaler 3  . amLODipine (NORVASC) 10 MG tablet Take 1 tablet (10 mg total) by mouth daily. 14 tablet 0  . cyclobenzaprine (FLEXERIL) 10 MG tablet Take 1 tablet (10 mg total) by mouth 3 (three) times daily as needed for muscle spasms. 30 tablet 0  . butalbital-acetaminophen-caffeine (FIORICET, ESGIC) 50-325-40 MG tablet Take 1-2 tablets by mouth every 6 (six) hours as needed for headache. (Patient not taking: Reported on 04/24/2017) 20 tablet 0  . diazepam (VALIUM) 5 MG tablet Take 1 tablet (5 mg total) by mouth every 8 (eight) hours as needed for muscle spasms. (Patient not taking: Reported on 04/24/2017) 20 tablet 0  . ferrous sulfate (EQL SLOW RELEASE IRON) 160 (50 Fe) MG TBCR SR tablet Take 1 tablet (160 mg total) by mouth daily. (Patient not taking: Reported on 05/31/2017) 30 each 6  . norgestrel-ethinyl estradiol (LO/OVRAL,CRYSELLE) 0.3-30 MG-MCG tablet Take 1 tablet by mouth daily. (Patient not taking: Reported on 04/24/2017) 1 Package 11  . predniSONE (STERAPRED UNI-PAK 21 TAB) 10 MG (21) TBPK tablet Take 6 tablets on day 1 Take 5 tablets on day 2 Take 4 tablets on day 3 Take 3 tablets on day 4 Take 2 tablets on day 5 Take 1 tablet on day 6 (  Patient not taking: Reported on 05/31/2017) 21 tablet 0  . traZODone (DESYREL) 50 MG tablet Take 50 mg by mouth at bedtime.    Marland Kitchen atorvastatin (LIPITOR) 20 MG tablet Take 1 tablet (20 mg total) by mouth daily. (Patient not taking: Reported on 04/24/2017) 30 tablet 3  . cephALEXin (KEFLEX) 500 MG capsule Take 1 capsule (500 mg total) by mouth 4 (four) times daily. (Patient not taking: Reported on 04/24/2017) 40 capsule 0  . diclofenac (VOLTAREN) 75 MG EC tablet Take 1 tablet (75 mg total)  by mouth 2 (two) times daily. (Patient not taking: Reported on 05/31/2017) 20 tablet 0  . famotidine (PEPCID) 20 MG tablet Take 1 tablet (20 mg total) by mouth 2 (two) times daily. (Patient not taking: Reported on 05/31/2017) 60 tablet 3  . ferrous sulfate 325 (65 FE) MG tablet Take 1 tablet (325 mg total) by mouth 3 (three) times daily with meals. (Patient not taking: Reported on 05/31/2017) 90 tablet 3  . ibuprofen (ADVIL,MOTRIN) 600 MG tablet Take 1 tablet (600 mg total) by mouth every 12 (twelve) hours as needed for mild pain or moderate pain. (Patient not taking: Reported on 04/24/2017) 60 tablet 3  . mometasone-formoterol (DULERA) 200-5 MCG/ACT AERO Inhale 2 puffs into the lungs 2 (two) times daily. (Patient not taking: Reported on 04/24/2017) 1 Inhaler 3  . montelukast (SINGULAIR) 10 MG tablet Take 1 tablet (10 mg total) by mouth at bedtime. (Patient not taking: Reported on 05/31/2017) 30 tablet 3  . oxyCODONE-acetaminophen (PERCOCET) 5-325 MG tablet Take 1 tablet by mouth every 8 (eight) hours as needed for severe pain. (Patient not taking: Reported on 05/31/2017) 12 tablet 0  . topiramate (TOPAMAX) 50 MG tablet Take 1 tablet (50 mg total) by mouth 2 (two) times daily. (Patient not taking: Reported on 05/31/2017) 60 tablet 3   No facility-administered medications prior to visit.     ROS Review of Systems  Constitutional: Negative for activity change, appetite change and fatigue.  HENT: Negative for congestion, sinus pressure and sore throat.   Eyes: Negative for visual disturbance.  Respiratory: Negative for cough, chest tightness, shortness of breath and wheezing.   Cardiovascular: Negative for chest pain and palpitations.  Gastrointestinal: Negative for abdominal distention, abdominal pain and constipation.  Endocrine: Negative for polydipsia.  Genitourinary: Negative for dysuria and frequency.  Musculoskeletal: Negative for arthralgias and back pain.  Skin: Negative for rash.  Neurological:  Negative for tremors, light-headedness and numbness.  Hematological: Does not bruise/bleed easily.  Psychiatric/Behavioral: Negative for agitation and behavioral problems.    Objective:  BP 119/81   Pulse 95   Temp 97.9 F (36.6 C) (Oral)   Ht 5\' 4"  (1.626 m)   Wt 239 lb 12.8 oz (108.8 kg)   LMP 05/10/2017 (Exact Date) Comment: tubal ligation  SpO2 100%   BMI 41.16 kg/m   BP/Weight 05/31/2017 05/18/2017 07/06/7351  Systolic BP 299 242 683  Diastolic BP 81 99 87  Wt. (Lbs) 239.8 230 -  BMI 41.16 39.48 -      Physical Exam  Constitutional: She is oriented to person, place, and time. She appears well-developed and well-nourished.  Cardiovascular: Normal rate, normal heart sounds and intact distal pulses.  No murmur heard. Pulmonary/Chest: Effort normal and breath sounds normal. She has no wheezes. She has no rales. She exhibits no tenderness.  Abdominal: Soft. Bowel sounds are normal. She exhibits no distension and no mass. There is no tenderness.  Musculoskeletal: She exhibits tenderness (TTP of lumbar spine, +  straight leg raise on the left).  Neurological: She is alert and oriented to person, place, and time.  Skin: Skin is warm and dry.  Psychiatric: She has a normal mood and affect.     Assessment & Plan:   1. Other migraine without status migrainosus, not intractable Stable No acute flares - topiramate (TOPAMAX) 50 MG tablet; Take 1 tablet (50 mg total) by mouth 2 (two) times daily.  Dispense: 60 tablet; Refill: 3  2. Essential hypertension Controlled Counseled on blood pressure goal of less than 130/80, low-sodium, DASH diet, medication compliance, 150 minutes of moderate intensity exercise per week. Discussed medication compliance, adverse effects. - amLODipine (NORVASC) 10 MG tablet; Take 1 tablet (10 mg total) by mouth daily.  Dispense: 30 tablet; Refill: 3  3. Mild intermittent asthma without complication No acute exacerbations - cyclobenzaprine (FLEXERIL) 10  MG tablet; Take 1 tablet (10 mg total) by mouth 3 (three) times daily as needed for muscle spasms.  Dispense: 60 tablet; Refill: 1 - montelukast (SINGULAIR) 10 MG tablet; Take 1 tablet (10 mg total) by mouth at bedtime.  Dispense: 30 tablet; Refill: 3 - mometasone-formoterol (DULERA) 200-5 MCG/ACT AERO; Inhale 2 puffs into the lungs 2 (two) times daily.  Dispense: 1 Inhaler; Refill: 3 - albuterol (PROVENTIL HFA;VENTOLIN HFA) 108 (90 Base) MCG/ACT inhaler; Inhale 2 puffs into the lungs every 6 (six) hours as needed for wheezing or shortness of breath (wheezing).  Dispense: 1 Inhaler; Refill: 3  4. Lumbar radiculopathy Uncontrolled She might ultimately require referral to spine specialist however her lack of medical coverage will pose a challenge.  Advised to apply for the [employer] financial discount previously followed by orthopedics - traMADol (ULTRAM) 50 MG tablet; Take 1 tablet (50 mg total) by mouth every 12 (twelve) hours as needed.  Dispense: 50 tablet; Refill: 1 - gabapentin (NEURONTIN) 300 MG capsule; Take 1 capsule (300 mg total) by mouth 2 (two) times daily.  Dispense: 60 capsule; Refill: 3 - meloxicam (MOBIC) 7.5 MG tablet; Take 1 tablet (7.5 mg total) by mouth daily.  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 Pure hypercholesterolemia Uncontrolled Has not been compliant with atorvastatin which I have refilled today. - atorvastatin (LIPITOR) 20 MG tablet; Take 1 tablet (20 mg total) by mouth daily.  Dispense: 30 tablet; Refill: 3   Meds ordered this encounter  Medications  . traMADol (ULTRAM) 50 MG tablet    Sig: Take 1 tablet (50 mg total) by mouth every 12 (twelve) hours as needed.    Dispense:  50 tablet    Refill:  1  . topiramate (TOPAMAX) 50 MG tablet    Sig: Take 1 tablet (50 mg total) by mouth 2 (two) times daily.     Dispense:  60 tablet    Refill:  3  . ferrous sulfate 325 (65 FE) MG tablet    Sig: Take 1 tablet (325 mg total) by mouth 3 (three) times daily with meals.    Dispense:  90 tablet    Refill:  3  . cyclobenzaprine (FLEXERIL) 10 MG tablet    Sig: Take 1 tablet (10 mg total) by mouth 3 (three) times daily as needed for muscle spasms.    Dispense:  60 tablet    Refill:  1  . amLODipine (NORVASC) 10 MG tablet    Sig: Take 1 tablet (10  mg total) by mouth daily.    Dispense:  30 tablet    Refill:  3  . montelukast (SINGULAIR) 10 MG tablet    Sig: Take 1 tablet (10 mg total) by mouth at bedtime.    Dispense:  30 tablet    Refill:  3  . mometasone-formoterol (DULERA) 200-5 MCG/ACT AERO    Sig: Inhale 2 puffs into the lungs 2 (two) times daily.    Dispense:  1 Inhaler    Refill:  3  . albuterol (PROVENTIL HFA;VENTOLIN HFA) 108 (90 Base) MCG/ACT inhaler    Sig: Inhale 2 puffs into the lungs every 6 (six) hours as needed for wheezing or shortness of breath (wheezing).    Dispense:  1 Inhaler    Refill:  3  . atorvastatin (LIPITOR) 20 MG tablet    Sig: Take 1 tablet (20 mg total) by mouth daily.    Dispense:  30 tablet    Refill:  3  . gabapentin (NEURONTIN) 300 MG capsule    Sig: Take 1 capsule (300 mg total) by mouth 2 (two) times daily.    Dispense:  60 capsule    Refill:  3  . meloxicam (MOBIC) 7.5 MG tablet    Sig: Take 1 tablet (7.5 mg total) by mouth daily.    Dispense:  30 tablet    Refill:  1  . ketorolac (TORADOL) injection 60 mg    Follow-up: Return in about 6 weeks (around 07/12/2017) for Follow up of chronic medical conditions.   Charlott Rakes MD

## 2017-06-08 ENCOUNTER — Emergency Department (HOSPITAL_COMMUNITY): Admission: EM | Admit: 2017-06-08 | Discharge: 2017-06-08 | Discharge status: Against Medical Advice | Payer: Self-pay

## 2017-06-08 ENCOUNTER — Emergency Department: Admission: EM | Admit: 2017-06-08 | Discharge: 2017-06-08 | Discharge status: Against Medical Advice | Payer: Self-pay

## 2017-06-08 NOTE — ED Notes (Signed)
No response

## 2017-06-08 NOTE — ED Notes (Signed)
No response when called from lobby. 

## 2017-06-08 NOTE — ED Notes (Signed)
Pt called in the WR with no response 

## 2017-06-08 NOTE — ED Notes (Signed)
No response when called from lobby to triage.

## 2017-06-12 ENCOUNTER — Encounter: Payer: Self-pay | Admitting: Emergency Medicine

## 2017-06-12 ENCOUNTER — Other Ambulatory Visit: Payer: Self-pay

## 2017-06-12 ENCOUNTER — Emergency Department
Admission: EM | Admit: 2017-06-12 | Discharge: 2017-06-12 | Disposition: A | Discharge status: Home / Self Care | Payer: Self-pay | Attending: Emergency Medicine | Admitting: Emergency Medicine

## 2017-06-12 DIAGNOSIS — I10 Essential (primary) hypertension: Secondary | ICD-10-CM | POA: Insufficient documentation

## 2017-06-12 DIAGNOSIS — Z79899 Other long term (current) drug therapy: Secondary | ICD-10-CM | POA: Insufficient documentation

## 2017-06-12 DIAGNOSIS — M5442 Lumbago with sciatica, left side: Secondary | ICD-10-CM | POA: Insufficient documentation

## 2017-06-12 DIAGNOSIS — J45909 Unspecified asthma, uncomplicated: Secondary | ICD-10-CM | POA: Insufficient documentation

## 2017-06-12 DIAGNOSIS — Z91010 Allergy to peanuts: Secondary | ICD-10-CM | POA: Insufficient documentation

## 2017-06-12 DIAGNOSIS — F17210 Nicotine dependence, cigarettes, uncomplicated: Secondary | ICD-10-CM | POA: Insufficient documentation

## 2017-06-12 DIAGNOSIS — D573 Sickle-cell trait: Secondary | ICD-10-CM | POA: Insufficient documentation

## 2017-06-12 MED ORDER — HYDROMORPHONE HCL 1 MG/ML IJ SOLN
0.5000 mg | Freq: Once | INTRAMUSCULAR | Status: AC
  Administered 2017-06-12: 0.5 mg via INTRAVENOUS
  Filled 2017-06-12: qty 1

## 2017-06-12 MED ORDER — METHOCARBAMOL 750 MG PO TABS: 750.0000 mg | ORAL_TABLET | Freq: Four times a day (QID) | ORAL | 0 refills | Status: DC

## 2017-06-12 MED ORDER — KETOROLAC TROMETHAMINE 30 MG/ML IJ SOLN: 30.0000 mg | Freq: Once | INTRAMUSCULAR | Status: DC

## 2017-06-12 MED ORDER — KETOROLAC TROMETHAMINE 10 MG PO TABS: 10.0000 mg | ORAL_TABLET | Freq: Four times a day (QID) | ORAL | 0 refills | Status: DC | PRN

## 2017-06-12 MED ORDER — KETOROLAC TROMETHAMINE 30 MG/ML IJ SOLN
30.0000 mg | Freq: Once | INTRAMUSCULAR | Status: AC
  Administered 2017-06-12: 30 mg via INTRAVENOUS
  Filled 2017-06-12: qty 1

## 2017-06-12 MED ORDER — OXYCODONE-ACETAMINOPHEN 5-325 MG PO TABS
2.0000 | ORAL_TABLET | Freq: Once | ORAL | Status: AC
  Administered 2017-06-12: 2 via ORAL
  Filled 2017-06-12: qty 2

## 2017-06-12 MED ORDER — OXYCODONE-ACETAMINOPHEN 10-325 MG PO TABS: 1.0000 | ORAL_TABLET | Freq: Four times a day (QID) | ORAL | 0 refills | Status: DC | PRN

## 2017-06-12 NOTE — ED Triage Notes (Signed)
Pt c/o lower back pain that radiates down left leg. Pt has hx of sciatica. Pt states she is out of medication and the pain has returned.  Pt states she was taking prednisone, muscle relaxer and percocet 5mg /325 pt states she was taking 2 percocets at a time because it has not helped with her pain.  Pt states the pain has been present for the past 3 weeks. Pt states she has tried gabapentin but does not work.

## 2017-06-12 NOTE — ED Notes (Signed)
Pt discharged to home.  Family member driving.  Discharge instructions reviewed.  Verbalized understanding.  No questions or concerns at this time.  Teach back verified.  Pt in NAD.  No items left in ED.   

## 2017-06-12 NOTE — ED Provider Notes (Signed)
[facility] Emergency Department Provider Note  ____________________________________________  Time seen: Approximately 8:41 AM  I have reviewed the triage vital signs and the nursing notes.   HISTORY  Chief Complaint Back Pain    HPI Kelly Byrd is a 42 y.o. female that presents to the emergency department for evaluation of low back pain that radiates down left leg worsening for 5 days.  Changing positions creates a shooting pain that goes down the back of left leg into foot.  Early in the week she had some numbness in her toes.  Was seen in the ED 1 month ago for low back pain and was given pain medication, steroids, muscle relaxers, which helped but pain has returned after discontinuation.  No bowel or bladder dysfunction or saddle paresthesias.  She does not have insurance so she is having difficulty getting the necessary appointments.  She is trying to apply for cones financial help.  No fever, chills, abdominal pain, dysuria, urgency, frequency.  Past Medical History:  Diagnosis Date  . Asthma   . Back pain   . GERD (gastroesophageal reflux disease)   . Hypertension   . Kidney stone   . Migraines   . Sickle cell trait [facility])     Patient Active Problem List   Diagnosis Date Noted  . Lumbar radiculopathy 05/31/2017  . Hyperlipidemia 05/31/2017  . GERD (gastroesophageal reflux disease) 08/05/2016  . Fibroids 09/19/2015  . Hypertension 08/29/2015  . Asthma 08/29/2015  . Back pain 08/29/2015  . 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 Anemia, iron deficiency 10/29/2014  . Menometrorrhagia 10/29/2014  . Migraine 10/27/2014    Past Surgical History:  Procedure Laterality Date  . CESAREAN SECTION     2    Prior to Admission medications   Medication Sig Start Date End Date Taking? Authorizing Provider  albuterol (PROVENTIL HFA;VENTOLIN HFA) 108 (90 Base) MCG/ACT inhaler Inhale 2 puffs into the lungs every 6 (six) hours as needed for  wheezing or shortness of breath (wheezing). 05/31/17   Charlott Rakes, MD  amLODipine (NORVASC) 10 MG tablet Take 1 tablet (10 mg total) by mouth daily. 05/31/17   Charlott Rakes, MD  atorvastatin (LIPITOR) 20 MG tablet Take 1 tablet (20 mg total) by mouth daily. 05/31/17   Charlott Rakes, MD  butalbital-acetaminophen-caffeine (FIORICET, ESGIC) 50-325-40 MG tablet Take 1-2 tablets by mouth every 6 (six) hours as needed for headache. Patient not taking: Reported on 04/24/2017 09/28/16 09/28/17  Earleen Newport, MD  cyclobenzaprine (FLEXERIL) 10 MG tablet Take 1 tablet (10 mg total) by mouth 3 (three) times daily as needed for muscle spasms. 05/31/17   Charlott Rakes, MD  diazepam (VALIUM) 5 MG tablet Take 1 tablet (5 mg total) by mouth every 8 (eight) hours as needed for muscle spasms. Patient not taking: Reported on 04/24/2017 09/28/16   Earleen Newport, MD  ferrous sulfate (EQL SLOW RELEASE IRON) 160 (50 Fe) MG TBCR SR tablet Take 1 tablet (160 mg total) by mouth daily. Patient not taking: Reported on 05/31/2017 09/28/16   Earleen Newport, MD  ferrous sulfate 325 (65 FE) MG tablet Take 1 tablet (325 mg total) by mouth 3 (three) times daily with meals. 05/31/17   Charlott Rakes, MD  gabapentin (NEURONTIN) 300 MG capsule Take 1 capsule (300 mg total) by mouth 2 (two) times daily. 05/31/17   Charlott Rakes, MD  ketorolac (TORADOL) 10 MG tablet Take 1 tablet (10 mg total) by mouth every 6 (six) hours as needed. 06/12/17  Laban Emperor, PA-C  meloxicam (MOBIC) 7.5 MG tablet Take 1 tablet (7.5 mg total) by mouth daily. 05/31/17   Charlott Rakes, MD  methocarbamol (ROBAXIN) 750 MG tablet Take 1 tablet (750 mg total) by mouth 4 (four) times daily. 06/12/17   Laban Emperor, PA-C  mometasone-formoterol (DULERA) 200-5 MCG/ACT AERO Inhale 2 puffs into the lungs 2 (two) times daily. 05/31/17   Charlott Rakes, MD  montelukast (SINGULAIR) 10 MG tablet Take 1 tablet (10 mg total) by mouth at bedtime. 05/31/17   Charlott Rakes, MD  norgestrel-ethinyl estradiol (LO/OVRAL,CRYSELLE) 0.3-30 MG-MCG tablet Take 1 tablet by mouth daily. Patient not taking: Reported on 04/24/2017 09/28/16   Earleen Newport, MD  oxyCODONE-acetaminophen (PERCOCET) 10-325 MG tablet Take 1 tablet by mouth every 6 (six) hours as needed for pain. 06/12/17 06/12/18  Laban Emperor, PA-C  predniSONE (STERAPRED UNI-PAK 21 TAB) 10 MG (21) TBPK tablet Take 6 tablets on day 1 Take 5 tablets on day 2 Take 4 tablets on day 3 Take 3 tablets on day 4 Take 2 tablets on day 5 Take 1 tablet on day 6 Patient not taking: Reported on 05/31/2017 05/18/17   Sherrie George B, FNP  topiramate (TOPAMAX) 50 MG tablet Take 1 tablet (50 mg total) by mouth 2 (two) times daily. 05/31/17   Charlott Rakes, MD  traMADol (ULTRAM) 50 MG tablet Take 1 tablet (50 mg total) by mouth every 12 (twelve) hours as needed. 05/31/17   Charlott Rakes, MD  traZODone (DESYREL) 50 MG tablet Take 50 mg by mouth at bedtime.    [provider]    Allergies Ace inhibitors; Lisinopril; and Peanuts [peanut oil]  Family History  Problem Relation Age of Onset  . Hypertension Mother   . Hypertension Father   . Diabetes Mellitus II Maternal Grandmother   . Lupus Unknown     Social History Social History   Tobacco Use  . Smoking status: Current Every Day Smoker    Packs/day: 0.25    Types: Cigarettes  . Smokeless tobacco: Never Used  Substance Use Topics  . Alcohol use: Yes    Alcohol/week: 0.6 oz    Types: 1 Cans of beer per week    Comment: 2 x 40oz bud ice  . Drug use: No     Review of Systems  Constitutional: No fever/chills ENT: No upper respiratory complaints. Cardiovascular: No chest pain. Respiratory: No SOB. Gastrointestinal: No abdominal pain.  No nausea, no vomiting.  Musculoskeletal: Positive for back pain. Skin: Negative for rash, abrasions, lacerations, ecchymosis.   ____________________________________________   PHYSICAL EXAM:  VITAL  SIGNS: ED Triage Vitals  Enc Vitals Group     BP 06/12/17 0747 (!) 157/96     Pulse Rate 06/12/17 0747 (!) 107     Resp 06/12/17 0747 (!) 24     Temp 06/12/17 0747 98.1 F (36.7 C)     Temp Source 06/12/17 0747 Oral     SpO2 06/12/17 0747 100 %     Weight 06/12/17 0748 234 lb (106.1 kg)     Height 06/12/17 0748 5\' 4"  (1.626 m)     Head Circumference --      Peak Flow --      Pain Score 06/12/17 0756 10     Pain Loc --      Pain Edu? --      Excl. in [place]? --      Constitutional: Alert and oriented. Well appearing and in no acute distress. Eyes: Conjunctivae are  normal. PERRL. EOMI. Head: Atraumatic. ENT:      Ears:      Nose: No congestion/rhinnorhea.      Mouth/Throat: Mucous membranes are moist.  Neck: No stridor.  Cardiovascular: Normal rate, regular rhythm.  Good peripheral circulation. Respiratory: Normal respiratory effort without tachypnea or retractions. Lungs CTAB. Good air entry to the bases with no decreased or absent breath sounds. Gastrointestinal: Bowel sounds 4 quadrants. Soft and nontender to palpation. No guarding or rigidity. No palpable masses. No distention.  Musculoskeletal: Full range of motion to all extremities. No gross deformities appreciated. Tenderness to palpation over left SI joint.  Positive straight leg raise. Neurologic:  Normal speech and language. No gross focal neurologic deficits are appreciated.  Skin:  Skin is warm, dry and intact. No rash noted.   ____________________________________________   LABS (all labs ordered are listed, but only abnormal results are displayed)  Labs Reviewed - No data to display ____________________________________________  EKG   ____________________________________________  RADIOLOGY   No results found.  ____________________________________________    PROCEDURES  Procedure(s) performed:    Procedures    Medications  HYDROmorphone (DILAUDID) injection 0.5 mg (0.5 mg Intravenous Given  06/12/17 0930)  HYDROmorphone (DILAUDID) injection 0.5 mg (0.5 mg Intravenous Given 06/12/17 1123)  ketorolac (TORADOL) 30 MG/ML injection 30 mg (30 mg Intravenous Given 06/12/17 1123)  oxyCODONE-acetaminophen (PERCOCET/ROXICET) 5-325 MG per tablet 2 tablet (2 tablets Oral Given 06/12/17 1240)     ____________________________________________   INITIAL IMPRESSION / ASSESSMENT AND PLAN / ED COURSE  Pertinent labs & imaging results that were available during my care of the patient were reviewed by me and considered in my medical decision making (see chart for details).  Review of the  [place] was performed in accordance of the [place] prior to dispensing any controlled drugs.   Patient's diagnosis is consistent with sciatica.  Vital signs and exam are reassuring.  Patient had a CT 1 month ago.  No bowel or bladder dysfunction or saddle paresthesias.  Pain improved with Dilaudid and Percocet.   Patient was able to get up and walk to the bathroom. Resources for [employer] care and reduce cost healthcare and [facility] were given.  Patient will be discharged home with prescriptions for Robaxin, Toradol, short course of Percocet. Patient is to follow up with PCP as directed. Patient is given ED precautions to return to the ED for any worsening or new symptoms.     ____________________________________________  FINAL CLINICAL IMPRESSION(S) / ED DIAGNOSES  Final diagnoses:  Acute left-sided low back pain with left-sided sciatica      NEW MEDICATIONS STARTED DURING THIS VISIT:  ED Discharge Orders        Ordered    methocarbamol (ROBAXIN) 750 MG tablet  4 times daily     06/12/17 1313    ketorolac (TORADOL) 10 MG tablet  Every 6 hours PRN     06/12/17 1313    oxyCODONE-acetaminophen (PERCOCET) 10-325 MG tablet  Every 6 hours PRN     06/12/17 1313          This chart was dictated using voice recognition software/Dragon. Despite best efforts to proofread, errors can occur which can  change the meaning. Any change was purely unintentional.    Laban Emperor, PA-C 06/12/17 1610    Earleen Newport, MD 06/15/17 [phone]

## 2017-06-21 ENCOUNTER — Encounter: Payer: Self-pay | Admitting: Family Medicine

## 2017-06-30 ENCOUNTER — Emergency Department
Admission: EM | Admit: 2017-06-30 | Discharge: 2017-06-30 | Disposition: A | Discharge status: Home / Self Care | Payer: Self-pay | Attending: Emergency Medicine | Admitting: Emergency Medicine

## 2017-06-30 ENCOUNTER — Encounter: Payer: Self-pay | Admitting: Emergency Medicine

## 2017-06-30 DIAGNOSIS — M7918 Myalgia, other site: Secondary | ICD-10-CM

## 2017-06-30 DIAGNOSIS — F17210 Nicotine dependence, cigarettes, uncomplicated: Secondary | ICD-10-CM | POA: Insufficient documentation

## 2017-06-30 DIAGNOSIS — Z79899 Other long term (current) drug therapy: Secondary | ICD-10-CM | POA: Insufficient documentation

## 2017-06-30 DIAGNOSIS — Z91010 Allergy to peanuts: Secondary | ICD-10-CM | POA: Insufficient documentation

## 2017-06-30 DIAGNOSIS — I10 Essential (primary) hypertension: Secondary | ICD-10-CM | POA: Insufficient documentation

## 2017-06-30 DIAGNOSIS — J45909 Unspecified asthma, uncomplicated: Secondary | ICD-10-CM | POA: Insufficient documentation

## 2017-06-30 MED ORDER — NAPROXEN 500 MG PO TABS
500.0000 mg | ORAL_TABLET | Freq: Once | ORAL | Status: AC
  Administered 2017-06-30: 500 mg via ORAL
  Filled 2017-06-30: qty 1

## 2017-06-30 MED ORDER — CYCLOBENZAPRINE HCL 10 MG PO TABS: 10.0000 mg | ORAL_TABLET | Freq: Three times a day (TID) | ORAL | 0 refills | Status: DC | PRN

## 2017-06-30 MED ORDER — OXYCODONE-ACETAMINOPHEN 5-325 MG PO TABS
1.0000 | ORAL_TABLET | Freq: Once | ORAL | Status: AC
  Administered 2017-06-30: 1 via ORAL
  Filled 2017-06-30: qty 1

## 2017-06-30 MED ORDER — IBUPROFEN 800 MG PO TABS: 800.0000 mg | ORAL_TABLET | Freq: Three times a day (TID) | ORAL | 0 refills | Status: DC | PRN

## 2017-06-30 MED ORDER — CYCLOBENZAPRINE HCL 10 MG PO TABS
10.0000 mg | ORAL_TABLET | Freq: Once | ORAL | Status: AC
  Administered 2017-06-30: 10 mg via ORAL
  Filled 2017-06-30: qty 1

## 2017-06-30 MED ORDER — OXYCODONE-ACETAMINOPHEN 7.5-325 MG PO TABS: 1.0000 | ORAL_TABLET | Freq: Four times a day (QID) | ORAL | 0 refills | Status: DC | PRN
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
## 2017-06-30 NOTE — Discharge Instructions (Signed)
Follow discharge care instruction advised medication may cause drowsiness.

## 2017-07-11 ENCOUNTER — Encounter: Payer: Self-pay | Admitting: *Deleted

## 2017-08-03 MED FILL — AMLODIPINE BESYLATE 10 MG T: 10 | 30 days supply | Qty: 30 | Fill #1

## 2017-08-04 ENCOUNTER — Other Ambulatory Visit: Payer: Self-pay

## 2017-08-04 ENCOUNTER — Ambulatory Visit: Payer: Self-pay | Attending: Family Medicine | Admitting: Family Medicine

## 2017-08-04 ENCOUNTER — Encounter: Payer: Self-pay | Admitting: Family Medicine

## 2017-08-04 VITALS — BP 156/92 | HR 76 | Temp 98.2°F | Ht 64.0 in | Wt 241.8 lb

## 2017-08-04 DIAGNOSIS — Z888 Allergy status to other drugs, medicaments and biological substances status: Secondary | ICD-10-CM | POA: Insufficient documentation

## 2017-08-04 DIAGNOSIS — Z0001 Encounter for general adult medical examination with abnormal findings: Secondary | ICD-10-CM | POA: Insufficient documentation

## 2017-08-04 DIAGNOSIS — D573 Sickle-cell trait: Secondary | ICD-10-CM | POA: Insufficient documentation

## 2017-08-04 DIAGNOSIS — G43809 Other migraine, not intractable, without status migrainosus: Secondary | ICD-10-CM | POA: Insufficient documentation

## 2017-08-04 DIAGNOSIS — Z91010 Allergy to peanuts: Secondary | ICD-10-CM | POA: Insufficient documentation

## 2017-08-04 DIAGNOSIS — Z0000 Encounter for general adult medical examination without abnormal findings: Secondary | ICD-10-CM

## 2017-08-04 DIAGNOSIS — Z1239 Encounter for other screening for malignant neoplasm of breast: Secondary | ICD-10-CM

## 2017-08-04 DIAGNOSIS — M93001 Unspecified slipped upper femoral epiphysis (nontraumatic), right hip: Secondary | ICD-10-CM | POA: Insufficient documentation

## 2017-08-04 DIAGNOSIS — K219 Gastro-esophageal reflux disease without esophagitis: Secondary | ICD-10-CM | POA: Insufficient documentation

## 2017-08-04 DIAGNOSIS — I10 Essential (primary) hypertension: Secondary | ICD-10-CM | POA: Insufficient documentation

## 2017-08-04 DIAGNOSIS — Z79899 Other long term (current) drug therapy: Secondary | ICD-10-CM | POA: Insufficient documentation

## 2017-08-04 DIAGNOSIS — E7800 Pure hypercholesterolemia, unspecified: Secondary | ICD-10-CM | POA: Insufficient documentation

## 2017-08-04 DIAGNOSIS — Z87442 Personal history of urinary calculi: Secondary | ICD-10-CM | POA: Insufficient documentation

## 2017-08-04 DIAGNOSIS — M25562 Pain in left knee: Secondary | ICD-10-CM | POA: Insufficient documentation

## 2017-08-04 DIAGNOSIS — G8929 Other chronic pain: Secondary | ICD-10-CM | POA: Insufficient documentation

## 2017-08-04 DIAGNOSIS — M5416 Radiculopathy, lumbar region: Secondary | ICD-10-CM

## 2017-08-04 DIAGNOSIS — Z1231 Encounter for screening mammogram for malignant neoplasm of breast: Secondary | ICD-10-CM

## 2017-08-04 DIAGNOSIS — R0789 Other chest pain: Secondary | ICD-10-CM | POA: Insufficient documentation

## 2017-08-04 DIAGNOSIS — Z23 Encounter for immunization: Secondary | ICD-10-CM

## 2017-08-04 DIAGNOSIS — J4520 Mild intermittent asthma, uncomplicated: Secondary | ICD-10-CM | POA: Insufficient documentation

## 2017-08-04 DIAGNOSIS — M5116 Intervertebral disc disorders with radiculopathy, lumbar region: Secondary | ICD-10-CM | POA: Insufficient documentation

## 2017-08-04 MED ORDER — GABAPENTIN 300 MG PO CAPS: 300.0000 mg | ORAL_CAPSULE | Freq: Two times a day (BID) | ORAL | 3 refills | Status: DC

## 2017-08-04 MED ORDER — CYCLOBENZAPRINE HCL 10 MG PO TABS: 10.0000 mg | ORAL_TABLET | Freq: Three times a day (TID) | ORAL | 1 refills | Status: DC | PRN

## 2017-08-04 MED ORDER — MONTELUKAST SODIUM 10 MG PO TABS: 10.0000 mg | ORAL_TABLET | Freq: Every day | ORAL | 3 refills | Status: DC

## 2017-08-04 MED ORDER — TOPIRAMATE 50 MG PO TABS: 50.0000 mg | ORAL_TABLET | Freq: Two times a day (BID) | ORAL | 3 refills | Status: DC

## 2017-08-04 MED ORDER — MELOXICAM 7.5 MG PO TABS: 7.5000 mg | ORAL_TABLET | Freq: Every day | ORAL | 1 refills | Status: DC

## 2017-08-04 MED ORDER — ALBUTEROL SULFATE HFA 108 (90 BASE) MCG/ACT IN AERS: 2.0000 | INHALATION_SPRAY | Freq: Four times a day (QID) | RESPIRATORY_TRACT | 3 refills | Status: DC | PRN

## 2017-08-04 MED ORDER — FERROUS SULFATE 325 (65 FE) MG PO TABS: 325.0000 mg | ORAL_TABLET | Freq: Three times a day (TID) | ORAL | 3 refills | Status: DC

## 2017-08-04 MED ORDER — AMLODIPINE BESYLATE 10 MG PO TABS: 10.0000 mg | ORAL_TABLET | Freq: Every day | ORAL | 3 refills | Status: DC

## 2017-08-04 MED ORDER — MOMETASONE FURO-FORMOTEROL FUM 200-5 MCG/ACT IN AERO: 2.0000 | INHALATION_SPRAY | Freq: Two times a day (BID) | RESPIRATORY_TRACT | 3 refills | Status: DC

## 2017-08-04 MED ORDER — ATORVASTATIN CALCIUM 20 MG PO TABS: 20.0000 mg | ORAL_TABLET | Freq: Every day | ORAL | 3 refills | Status: DC

## 2017-08-04 NOTE — Patient Instructions (Signed)

## 2017-08-04 NOTE — Progress Notes (Signed)
Patient is having pain in lower back right hip an left leg.  Patient needs printed scripts today.

## 2017-08-04 NOTE — Progress Notes (Signed)
Subjective:  Patient ID: Kelly Byrd, female    DOB: 08/11/1975  Age: 42 y.o. MRN: [id number]  CC: Annual Exam and Back Pain   HPI Kelly Byrd is a 42 year old female with a history of hypertension, asthma, migraine, anemia (secondary to menorrhagia from fibroids), chronic low back pain from lumbar degenerative disc disease, slipped capital femoral epiphysis who comes into the clinic for an annual physical. She has pain in her lower back and left knee and right hip at this time and has been unable to obtain her pain medications due to cost and would like them printed out as the pharmacy at her psychiatrist's office-[facility], [place] will be assisting her with her medications. She complains of left-sided chest pain which is intermittent and unrelated to activity but denies shortness of breath. She has never had a mammogram last Pap smear in 10/2015 was negative for malignancy. She complains of intermittent left sided chest pain which occurs at rest but denies shortness of breath.  Past Medical History:  Diagnosis Date  . Asthma   . Back pain   . GERD (gastroesophageal reflux disease)   . Hypertension   . Kidney stone   . Migraines   . Sickle cell trait [facility])       Past Surgical History:  Procedure Laterality Date  . CESAREAN SECTION     2    Allergies  Allergen Reactions  . Ace Inhibitors Swelling  . Lisinopril   . Peanuts [Peanut Oil]      Outpatient Medications Prior to Visit  Medication Sig Dispense Refill  . ibuprofen (ADVIL,MOTRIN) 800 MG tablet Take 1 tablet (800 mg total) by mouth every 8 (eight) hours as needed for moderate pain. 15 tablet 0  . ketorolac (TORADOL) 10 MG tablet Take 1 tablet (10 mg total) by mouth every 6 (six) hours as needed. 20 tablet 0  . traMADol (ULTRAM) 50 MG tablet Take 1 tablet (50 mg total) by mouth every 12 (twelve) hours as needed. 50 tablet 1  . traZODone (DESYREL) 50 MG tablet Take 50 mg by mouth at bedtime.    Marland Kitchen albuterol  (PROVENTIL HFA;VENTOLIN HFA) 108 (90 Base) MCG/ACT inhaler Inhale 2 puffs into the lungs every 6 (six) hours as needed for wheezing or shortness of breath (wheezing). 1 Inhaler 3  . amLODipine (NORVASC) 10 MG tablet Take 1 tablet (10 mg total) by mouth daily. 30 tablet 3  . atorvastatin (LIPITOR) 20 MG tablet Take 1 tablet (20 mg total) by mouth daily. 30 tablet 3  . butalbital-acetaminophen-caffeine (FIORICET, ESGIC) 50-325-40 MG tablet Take 1-2 tablets by mouth every 6 (six) hours as needed for headache. 20 tablet 0  . cyclobenzaprine (FLEXERIL) 10 MG tablet Take 1 tablet (10 mg total) by mouth 3 (three) times daily as needed for muscle spasms. 60 tablet 1  . cyclobenzaprine (FLEXERIL) 10 MG tablet Take 1 tablet (10 mg total) by mouth 3 (three) times daily as needed. 15 tablet 0  . ferrous sulfate 325 (65 FE) MG tablet Take 1 tablet (325 mg total) by mouth 3 (three) times daily with meals. 90 tablet 3  . gabapentin (NEURONTIN) 300 MG capsule Take 1 capsule (300 mg total) by mouth 2 (two) times daily. 60 capsule 3  . meloxicam (MOBIC) 7.5 MG tablet Take 1 tablet (7.5 mg total) by mouth daily. 30 tablet 1  . methocarbamol (ROBAXIN) 750 MG tablet Take 1 tablet (750 mg total) by mouth 4 (four) times daily. 30 tablet 0  . mometasone-formoterol (DULERA)  200-5 MCG/ACT AERO Inhale 2 puffs into the lungs 2 (two) times daily. 1 Inhaler 3  . montelukast (SINGULAIR) 10 MG tablet Take 1 tablet (10 mg total) by mouth at bedtime. 30 tablet 3  . topiramate (TOPAMAX) 50 MG tablet Take 1 tablet (50 mg total) by mouth 2 (two) times daily. 60 tablet 3  . ferrous sulfate (EQL SLOW RELEASE IRON) 160 (50 Fe) MG TBCR SR tablet Take 1 tablet (160 mg total) by mouth daily. (Patient not taking: Reported on 05/31/2017) 30 each 6  . norgestrel-ethinyl estradiol (LO/OVRAL,CRYSELLE) 0.3-30 MG-MCG tablet Take 1 tablet by mouth daily. (Patient not taking: Reported on 04/24/2017) 1 Package 11  . oxyCODONE-acetaminophen (PERCOCET)  10-325 MG tablet Take 1 tablet by mouth every 6 (six) hours as needed for pain. (Patient not taking: Reported on 08/04/2017) 6 tablet 0  . oxyCODONE-acetaminophen (PERCOCET) 7.5-325 MG tablet Take 1 tablet by mouth every 6 (six) hours as needed for severe pain. (Patient not taking: Reported on 08/04/2017) 12 tablet 0  . predniSONE (STERAPRED UNI-PAK 21 TAB) 10 MG (21) TBPK tablet Take 6 tablets on day 1 Take 5 tablets on day 2 Take 4 tablets on day 3 Take 3 tablets on day 4 Take 2 tablets on day 5 Take 1 tablet on day 6 (Patient not taking: Reported on 05/31/2017) 21 tablet 0  . diazepam (VALIUM) 5 MG tablet Take 1 tablet (5 mg total) by mouth every 8 (eight) hours as needed for muscle spasms. (Patient not taking: Reported on 04/24/2017) 20 tablet 0   No facility-administered medications prior to visit.     ROS Review of Systems  Constitutional: Negative for activity change, appetite change and fatigue.  HENT: Negative for congestion, sinus pressure and sore throat.   Eyes: Negative for visual disturbance.  Respiratory: Negative for cough, chest tightness, shortness of breath and wheezing.   Cardiovascular: Negative for chest pain and palpitations.  Gastrointestinal: Negative for abdominal distention, abdominal pain and constipation.  Endocrine: Negative for polydipsia.  Genitourinary: Negative for dysuria and frequency.  Musculoskeletal:       See hpi  Skin: Negative for rash.  Neurological: Negative for tremors, light-headedness and numbness.  Hematological: Does not bruise/bleed easily.  Psychiatric/Behavioral: Negative for agitation and behavioral problems.    Objective:  BP (!) 156/92   Pulse 76   Temp 98.2 F (36.8 C) (Oral)   Ht 5\' 4"  (1.626 m)   Wt 241 lb 12.8 oz (109.7 kg)   SpO2 100%   BMI 41.50 kg/m   BP/Weight 08/04/2017 06/30/2017 09/15/5091  Systolic BP 267 124 580  Diastolic BP 92 84 80  Wt. (Lbs) 241.8 234 234  BMI 41.5 40.17 40.17      Physical Exam    Constitutional: She is oriented to person, place, and time. She appears well-developed and well-nourished.  Cardiovascular: Normal rate, normal heart sounds and intact distal pulses.  No murmur heard. Pulmonary/Chest: Effort normal and breath sounds normal. She has no wheezes. She has no rales. She exhibits no tenderness. Right breast exhibits no mass and no tenderness. Left breast exhibits no mass and no tenderness.  Abdominal: Soft. Bowel sounds are normal. She exhibits no distension and no mass. There is no tenderness.  Musculoskeletal: She exhibits tenderness (TTP of right hip left knee and on ROM).  Neurological: She is alert and oriented to person, place, and time.  Skin: Skin is warm and dry.  Psychiatric: She has a normal mood and affect.     CMP  Latest Ref Rng & Units 04/23/2017 04/11/2017 09/28/2016  Glucose 65 - 99 mg/dL 96 123(H) 113(H)  BUN 6 - 20 mg/dL 8 12 8   Creatinine 0.44 - 1.00 mg/dL 0.66 0.68 0.61  Sodium 135 - 145 mmol/L 143 138 140  Potassium 3.5 - 5.1 mmol/L 3.3(L) 3.5 3.1(L)  Chloride 101 - 111 mmol/L 109 106 106  CO2 22 - 32 mmol/L 26 27 24   Calcium 8.9 - 10.3 mg/dL 9.2 9.2 9.3  Total Protein 6.5 - 8.1 g/dL 7.3 - -  Total Bilirubin 0.3 - 1.2 mg/dL 0.5 - -  Alkaline Phos 38 - 126 U/L 51 - -  AST 15 - 41 U/L 18 - -  ALT 14 - 54 U/L 17 - -    Lipid Panel     Component Value Date/Time   CHOL 193 08/06/2016 0840   TRIG 87 08/06/2016 0840   HDL 44 08/06/2016 0840   CHOLHDL 4.4 08/06/2016 0840   LDLCALC 132 (H) 08/06/2016 0840    Assessment & Plan:   1. Annual physical exam Counseled on 150 minutes of exercise per week, healthy eating (including decreased daily intake of saturated fats, cholesterol, added sugars, sodium), STI prevention, routine healthcare maintenance.   2. Screening for breast cancer - MM Digital Screening; Future  3. Other chest pain EKG is unremarkable  4. Chronic pain of left knee Continue current analgesics Continue  NSAIDs  5. 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 Other migraine without status migrainosus, not intractable - topiramate (TOPAMAX) 50 MG tablet; Take 1 tablet (50 mg total) by mouth 2 (two) times daily.  Dispense: 60 tablet; Refill: 3  7. Mild intermittent asthma without complication - montelukast (SINGULAIR) 10 MG tablet; Take 1 tablet (10 mg total) by mouth at bedtime.  Dispense: 30 tablet; Refill: 3 - mometasone-formoterol (DULERA) 200-5 MCG/ACT AERO; Inhale 2 puffs into the lungs 2 (two) times daily.  Dispense: 1 Inhaler; Refill: 3 - cyclobenzaprine (FLEXERIL) 10 MG tablet; Take 1 tablet (10 mg total) by mouth 3 (three) times daily as needed for muscle spasms.  Dispense: 60 tablet; Refill: 1 - albuterol (PROVENTIL HFA;VENTOLIN HFA) 108 (90 Base) MCG/ACT inhaler; Inhale 2 puffs into the lungs every 6 (six) hours as needed for wheezing or shortness of breath (wheezing).  Dispense: 1 Inhaler; Refill: 3  8. Lumbar radiculopathy - meloxicam (MOBIC) 7.5 MG tablet; Take 1 tablet (7.5 mg total) by mouth daily.  Dispense: 30 tablet; Refill: 1 - gabapentin (NEURONTIN) 300 MG capsule; Take 1 capsule (300 mg total) by mouth 2 (two) times daily.  Dispense: 60 capsule; Refill: 3  9. Essential hypertension - amLODipine (NORVASC) 10 MG tablet; Take 1 tablet (10 mg total) by mouth daily.  Dispense: 30 tablet; Refill: 3  10. Pure hypercholesterolemia - atorvastatin (LIPITOR) 20 MG tablet; Take 1 tablet (20 mg total) by mouth daily.  Dispense: 30 tablet; Refill: 3    Meds ordered this encounter  Medications  . topiramate (TOPAMAX) 50 MG tablet    Sig: Take 1 tablet (50 mg total) by mouth 2 (two) times daily.    Dispense:  60 tablet    Refill:  3  . montelukast (SINGULAIR) 10 MG tablet    Sig: Take 1 tablet (10 mg total) by mouth at bedtime.    Dispense:  30 tablet     Refill:  3  . mometasone-formoterol (  DULERA) 200-5 MCG/ACT AERO    Sig: Inhale 2 puffs into the lungs 2 (two) times daily.    Dispense:  1 Inhaler    Refill:  3  . meloxicam (MOBIC) 7.5 MG tablet    Sig: Take 1 tablet (7.5 mg total) by mouth daily.    Dispense:  30 tablet    Refill:  1  . gabapentin (NEURONTIN) 300 MG capsule    Sig: Take 1 capsule (300 mg total) by mouth 2 (two) times daily.    Dispense:  60 capsule    Refill:  3  . ferrous sulfate 325 (65 FE) MG tablet    Sig: Take 1 tablet (325 mg total) by mouth 3 (three) times daily with meals.    Dispense:  90 tablet    Refill:  3  . cyclobenzaprine (FLEXERIL) 10 MG tablet    Sig: Take 1 tablet (10 mg total) by mouth 3 (three) times daily as needed for muscle spasms.    Dispense:  60 tablet    Refill:  1  . amLODipine (NORVASC) 10 MG tablet    Sig: Take 1 tablet (10 mg total) by mouth daily.    Dispense:  30 tablet    Refill:  3  . albuterol (PROVENTIL HFA;VENTOLIN HFA) 108 (90 Base) MCG/ACT inhaler    Sig: Inhale 2 puffs into the lungs every 6 (six) hours as needed for wheezing or shortness of breath (wheezing).    Dispense:  1 Inhaler    Refill:  3  . atorvastatin (LIPITOR) 20 MG tablet    Sig: Take 1 tablet (20 mg total) by mouth daily.    Dispense:  30 tablet    Refill:  3    Follow-up: Return in about 3 months (around 11/04/2017) for follow up of chronic medical conditions.   Charlott Rakes MD

## 2017-09-15 ENCOUNTER — Other Ambulatory Visit (HOSPITAL_COMMUNITY): Payer: Self-pay | Admitting: *Deleted

## 2017-09-15 DIAGNOSIS — N644 Mastodynia: Secondary | ICD-10-CM

## 2017-09-28 ENCOUNTER — Emergency Department
Admission: EM | Admit: 2017-09-28 | Discharge: 2017-09-28 | Disposition: A | Discharge status: Home / Self Care | Payer: Medicaid Other | Attending: Emergency Medicine | Admitting: Emergency Medicine

## 2017-09-28 ENCOUNTER — Other Ambulatory Visit: Payer: Self-pay

## 2017-09-28 ENCOUNTER — Encounter: Payer: Self-pay | Admitting: Emergency Medicine

## 2017-09-28 DIAGNOSIS — L0211 Cutaneous abscess of neck: Secondary | ICD-10-CM | POA: Insufficient documentation

## 2017-09-28 DIAGNOSIS — F17210 Nicotine dependence, cigarettes, uncomplicated: Secondary | ICD-10-CM | POA: Insufficient documentation

## 2017-09-28 DIAGNOSIS — D573 Sickle-cell trait: Secondary | ICD-10-CM | POA: Insufficient documentation

## 2017-09-28 DIAGNOSIS — I10 Essential (primary) hypertension: Secondary | ICD-10-CM | POA: Insufficient documentation

## 2017-09-28 DIAGNOSIS — E785 Hyperlipidemia, unspecified: Secondary | ICD-10-CM | POA: Insufficient documentation

## 2017-09-28 DIAGNOSIS — J45909 Unspecified asthma, uncomplicated: Secondary | ICD-10-CM | POA: Insufficient documentation

## 2017-09-28 DIAGNOSIS — Z79899 Other long term (current) drug therapy: Secondary | ICD-10-CM | POA: Insufficient documentation

## 2017-09-28 DIAGNOSIS — M5416 Radiculopathy, lumbar region: Secondary | ICD-10-CM | POA: Insufficient documentation

## 2017-09-28 MED ORDER — KETOROLAC TROMETHAMINE 10 MG PO TABS: 10.0000 mg | ORAL_TABLET | Freq: Four times a day (QID) | ORAL | 0 refills | Status: DC | PRN

## 2017-09-28 MED ORDER — KETOROLAC TROMETHAMINE 30 MG/ML IJ SOLN
30.0000 mg | Freq: Once | INTRAMUSCULAR | Status: AC
  Administered 2017-09-28: 30 mg via INTRAMUSCULAR
  Filled 2017-09-28: qty 1

## 2017-09-28 MED ORDER — HYDROCODONE-ACETAMINOPHEN 5-325 MG PO TABS: 1.0000 | ORAL_TABLET | Freq: Four times a day (QID) | ORAL | 0 refills | Status: DC | PRN

## 2017-09-28 MED ORDER — METHOCARBAMOL 500 MG PO TABS: ORAL_TABLET | ORAL | 0 refills | Status: DC

## 2017-09-28 MED ORDER — METHOCARBAMOL 500 MG PO TABS
1000.0000 mg | ORAL_TABLET | Freq: Once | ORAL | Status: AC
  Administered 2017-09-28: 1000 mg via ORAL
  Filled 2017-09-28: qty 2

## 2017-09-28 MED ORDER — OXYCODONE-ACETAMINOPHEN 5-325 MG PO TABS
1.0000 | ORAL_TABLET | Freq: Once | ORAL | Status: AC
  Administered 2017-09-28: 1 via ORAL
  Filled 2017-09-28: qty 1

## 2017-09-28 MED ORDER — SULFAMETHOXAZOLE-TRIMETHOPRIM 800-160 MG PO TABS: 1.0000 | ORAL_TABLET | Freq: Two times a day (BID) | ORAL | 0 refills | Status: DC

## 2017-09-28 NOTE — ED Provider Notes (Signed)
[facility] Emergency Department Provider Note  ____________________________________________   First MD Initiated Contact with Patient 09/28/17 1240     (approximate)  I have reviewed the triage vital signs and the nursing notes.   HISTORY  Chief Complaint Sciatica   HPI Kelly Byrd is a 42 y.o. female is here with complaint of sciatic issues again.  Patient states that she has had problems with this off and on for a long time.  She states that this pain that she is experiencing today is her usual pain.  She denies any recent injury.  Currently she is seeing someone in [place] who is trying to get her into a free clinic so that she can be seen by a specialist.  She denies any urinary symptoms, incontinence of bowel bladder, saddle anesthesias.  Patient continues to ambulate without assistance.  Patient has not taken any over-the-counter medication for her pain today.  She took her regular dose of gabapentin yesterday.  She is also here to have an area on her neck looked at.  She states this started a few days ago and has become quite painful.  She rates her pain as 10/10.   Past Medical History:  Diagnosis Date  . Asthma   . Back pain   . GERD (gastroesophageal reflux disease)   . Hypertension   . Kidney stone   . Migraines   . Sickle cell trait [facility])     Patient Active Problem List   Diagnosis Date Noted  . Lumbar radiculopathy 05/31/2017  . Hyperlipidemia 05/31/2017  . GERD (gastroesophageal reflux disease) 08/05/2016  . Fibroids 09/19/2015  . Hypertension 08/29/2015  . Asthma 08/29/2015  . Back pain 08/29/2015  . 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 Anemia, iron deficiency 10/29/2014  . Menometrorrhagia 10/29/2014  . Migraine 10/27/2014    Past Surgical History:  Procedure Laterality Date  . CESAREAN SECTION     2    Prior to Admission medications   Medication Sig Start Date End Date Taking? Authorizing Provider    albuterol (PROVENTIL HFA;VENTOLIN HFA) 108 (90 Base) MCG/ACT inhaler Inhale 2 puffs into the lungs every 6 (six) hours as needed for wheezing or shortness of breath (wheezing). 08/04/17   Charlott Rakes, MD  amLODipine (NORVASC) 10 MG tablet Take 1 tablet (10 mg total) by mouth daily. 08/04/17   Charlott Rakes, MD  atorvastatin (LIPITOR) 20 MG tablet Take 1 tablet (20 mg total) by mouth daily. 08/04/17   Charlott Rakes, MD  gabapentin (NEURONTIN) 300 MG capsule Take 1 capsule (300 mg total) by mouth 2 (two) times daily. 08/04/17   Charlott Rakes, MD  HYDROcodone-acetaminophen (NORCO/VICODIN) 5-325 MG tablet Take 1 tablet by mouth every 6 (six) hours as needed for moderate pain. 09/28/17   Johnn Hai, PA-C  ketorolac (TORADOL) 10 MG tablet Take 1 tablet (10 mg total) by mouth every 6 (six) hours as needed for moderate pain. 09/28/17   Johnn Hai, PA-C  methocarbamol (ROBAXIN) 500 MG tablet 1 tablet every 6 hours for muscle spasms. 09/28/17   Johnn Hai, PA-C  mometasone-formoterol (DULERA) 200-5 MCG/ACT AERO Inhale 2 puffs into the lungs 2 (two) times daily. 08/04/17   Charlott Rakes, MD  montelukast (SINGULAIR) 10 MG tablet Take 1 tablet (10 mg total) by mouth at bedtime. 08/04/17   Charlott Rakes, MD  sulfamethoxazole-trimethoprim (BACTRIM DS,SEPTRA DS) 800-160 MG tablet Take 1 tablet by mouth 2 (two) times daily. 09/28/17   Johnn Hai, PA-C  topiramate (TOPAMAX) 50 MG tablet Take 1 tablet (50 mg total) by mouth 2 (two) times daily. 08/04/17   Charlott Rakes, MD  traZODone (DESYREL) 50 MG tablet Take 50 mg by mouth at bedtime.    [provider]    Allergies Ace inhibitors; Lisinopril; and Peanuts [peanut oil]  Family History  Problem Relation Age of Onset  . Hypertension Mother   . Hypertension Father   . Diabetes Mellitus II Maternal Grandmother   . Lupus Unknown     Social History Social History   Tobacco Use  . Smoking status: Current Every Day Smoker     Packs/day: 0.25    Types: Cigarettes  . Smokeless tobacco: Never Used  Substance Use Topics  . Alcohol use: Yes    Alcohol/week: 0.6 oz    Types: 1 Cans of beer per week    Comment: 2 x 40oz bud ice  . Drug use: No    Review of Systems Constitutional: No fever/chills Eyes: No visual changes. ENT: No sore throat. Cardiovascular: Denies chest pain. Respiratory: Denies shortness of breath. Gastrointestinal: No abdominal pain.  No nausea, no vomiting.  Genitourinary: Negative for dysuria. Musculoskeletal: Positive for low back pain with radiculopathy. Skin: Negative for rash. Neurological: Negative for headaches, focal weakness or numbness. ___________________________________________   PHYSICAL EXAM:  VITAL SIGNS: ED Triage Vitals  Enc Vitals Group     BP 09/28/17 1224 (!) 141/90     Pulse Rate 09/28/17 1224 85     Resp 09/28/17 1224 16     Temp 09/28/17 1224 97.7 F (36.5 C)     Temp Source 09/28/17 1224 Oral     SpO2 09/28/17 1224 100 %     Weight 09/28/17 1225 220 lb (99.8 kg)     Height 09/28/17 1225 5\' 4"  (1.626 m)     Head Circumference --      Peak Flow --      Pain Score 09/28/17 1225 10     Pain Loc --      Pain Edu? --      Excl. in [place]? --    Constitutional: Alert and oriented. Well appearing and in no acute distress. Eyes: Conjunctivae are normal.  Head: Atraumatic. Nose: No congestion/rhinnorhea. Mouth/Throat: Mucous membranes are moist.  Oropharynx non-erythematous. Neck: No stridor.   Cardiovascular: Normal rate, regular rhythm. Grossly normal heart sounds.  Good peripheral circulation. Respiratory: Normal respiratory effort.  No retractions. Lungs CTAB. Gastrointestinal: Soft and nontender. No distention. No CVA tenderness. Musculoskeletal: Moves upper and lower extremities without any difficulty.  Patient is moderately tender on palpation of the L5-S1 area.  There is no tenderness on palpation of the thoracic spine.  Range of motion of the back is  guarded secondary to pain.  Patient is mostly lying on her left side complaining of pain.  Straight leg raises were approximately 20 degrees bilaterally.  Good muscle strength bilaterally. Neurologic:  Normal speech and language. No gross focal neurologic deficits are appreciated.  Patella tendon reflexes were 1+ bilaterally.  No gait instability. Skin:  Skin is warm, dry and intact.  Posterior neck tissue has an early abscess that is tender to touch.  There is minimal erythema at this time.  No drainage is present.  Area is firm to touch. Psychiatric: Mood and affect are normal. Speech and behavior are normal.  ____________________________________________   LABS (all labs ordered are listed, but only abnormal results are displayed)  Labs Reviewed - No data to display  PROCEDURES  Procedure(s) performed: None  Procedures  Critical Care performed: No  ____________________________________________   INITIAL IMPRESSION / ASSESSMENT AND PLAN / ED COURSE  As part of my medical decision making, I reviewed the following data within the electronic MEDICAL RECORD NUMBER Notes from prior ED visits and [place] Controlled Substance Database  Patient presents with recurrent chronic low back pain.  Patient's symptoms are the same as always.  She states that she is trying to get her Medicaid to cover her appointments at an orthopedic office in [place].  Patient was given Toradol 30 mg IM, Robaxin 1000 mg p.o. and Percocet 5/325 1 p.o. while in the emergency department.  Patient was discharged with prescription for Bactrim DS for her abscess, methocarbamol 500 mg 4 times daily, Toradol 10 mg 4 times daily with food and Norco if needed for moderate to severe pain.  She does call her PCP in [place] to make arrangements for referral to an orthopedist for her chronic low back pain.  Patient is aware that she needs to start use warm compresses to her neck and she may return to the emergency department over the  holiday weekend for I&D if needed  ____________________________________________   FINAL CLINICAL IMPRESSION(S) / ED DIAGNOSES  Final diagnoses:  Lumbar back pain with radiculopathy affecting lower extremity  Cutaneous abscess of neck     ED Discharge Orders        Ordered    sulfamethoxazole-trimethoprim (BACTRIM DS,SEPTRA DS) 800-160 MG tablet  2 times daily     09/28/17 1511    ketorolac (TORADOL) 10 MG tablet  Every 6 hours PRN     09/28/17 1513    methocarbamol (ROBAXIN) 500 MG tablet     09/28/17 1513    HYDROcodone-acetaminophen (NORCO/VICODIN) 5-325 MG tablet  Every 6 hours PRN     09/28/17 1513       Note:  This document was prepared using Dragon voice recognition software and may include unintentional dictation errors.    Johnn Hai, PA-C 09/28/17 1526    Carrie Mew, MD 09/29/17 [phone]

## 2017-09-28 NOTE — ED Triage Notes (Signed)
Pt comes into the ED via POV c/o issues with her sciatica again.  Patient states its bilateral and runs from her lower back through her buttock and down into her legs.  Patient has a h/o of it and states this feels the same.  Patient has even and unlabored respirations at this time and in NAd.

## 2017-09-28 NOTE — Discharge Instructions (Addendum)
Follow-up with your primary care provider next week.  Call make an appointment.  Use ice or heat to your back as needed for discomfort.  Begin taking Robaxin 1 or 2 tablets every 6 hours, Norco 1 every 6 hours as needed for moderate to severe pain, Toradol 10 mg 4 times daily with food. Also begin using warm moist compresses to your neck for the early abscess that is forming.  You will need to begin taking antibiotics for this for the next 10 days.  This area may open up and drained on its own.  If not you will need to return where it can be opened and drained in the emergency department.

## 2017-09-28 NOTE — ED Notes (Signed)
Patient appears more comfortable, asked for and received a cup of ice and a remote.

## 2017-09-30 ENCOUNTER — Emergency Department: Payer: No Typology Code available for payment source

## 2017-09-30 ENCOUNTER — Encounter: Payer: Self-pay | Admitting: Emergency Medicine

## 2017-09-30 ENCOUNTER — Emergency Department
Admission: EM | Admit: 2017-09-30 | Discharge: 2017-09-30 | Disposition: A | Discharge status: Home / Self Care | Payer: No Typology Code available for payment source | Attending: Emergency Medicine | Admitting: Emergency Medicine

## 2017-09-30 ENCOUNTER — Other Ambulatory Visit: Payer: Self-pay

## 2017-09-30 DIAGNOSIS — Y998 Other external cause status: Secondary | ICD-10-CM | POA: Insufficient documentation

## 2017-09-30 DIAGNOSIS — Y939 Activity, unspecified: Secondary | ICD-10-CM | POA: Insufficient documentation

## 2017-09-30 DIAGNOSIS — I10 Essential (primary) hypertension: Secondary | ICD-10-CM | POA: Insufficient documentation

## 2017-09-30 DIAGNOSIS — F17210 Nicotine dependence, cigarettes, uncomplicated: Secondary | ICD-10-CM | POA: Diagnosis not present

## 2017-09-30 DIAGNOSIS — Z79899 Other long term (current) drug therapy: Secondary | ICD-10-CM | POA: Diagnosis not present

## 2017-09-30 DIAGNOSIS — S41111A Laceration without foreign body of right upper arm, initial encounter: Secondary | ICD-10-CM | POA: Diagnosis not present

## 2017-09-30 DIAGNOSIS — Z23 Encounter for immunization: Secondary | ICD-10-CM | POA: Insufficient documentation

## 2017-09-30 DIAGNOSIS — Y92410 Unspecified street and highway as the place of occurrence of the external cause: Secondary | ICD-10-CM | POA: Insufficient documentation

## 2017-09-30 DIAGNOSIS — J45909 Unspecified asthma, uncomplicated: Secondary | ICD-10-CM | POA: Diagnosis not present

## 2017-09-30 DIAGNOSIS — S81011A Laceration without foreign body, right knee, initial encounter: Secondary | ICD-10-CM | POA: Diagnosis not present

## 2017-09-30 MED ORDER — LIDOCAINE HCL (PF) 1 % IJ SOLN
INTRAMUSCULAR | Status: AC
  Filled 2017-09-30: qty 5

## 2017-09-30 MED ORDER — TETANUS-DIPHTH-ACELL PERTUSSIS 5-2.5-18.5 LF-MCG/0.5 IM SUSP
0.5000 mL | Freq: Once | INTRAMUSCULAR | Status: AC
  Administered 2017-09-30: 0.5 mL via INTRAMUSCULAR
  Filled 2017-09-30: qty 0.5

## 2017-09-30 MED ORDER — OXYCODONE-ACETAMINOPHEN 2.5-325 MG PO TABS: 1.0000 | ORAL_TABLET | Freq: Four times a day (QID) | ORAL | 0 refills | Status: DC | PRN

## 2017-09-30 MED ORDER — LIDOCAINE HCL (PF) 1 % IJ SOLN
INTRAMUSCULAR | Status: AC
  Administered 2017-09-30: 5 mL
  Filled 2017-09-30: qty 5

## 2017-09-30 MED ORDER — OXYCODONE-ACETAMINOPHEN 5-325 MG PO TABS
1.0000 | ORAL_TABLET | Freq: Once | ORAL | Status: AC
  Administered 2017-09-30: 1 via ORAL
  Filled 2017-09-30: qty 1

## 2017-09-30 NOTE — ED Notes (Signed)
Patient refused her CT Scans after getting x-rays done. Kelly Byrd informed Dr. Cinda Quest and I informed Rachael RN that if she decided to have her scans to call us.
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
## 2017-09-30 NOTE — ED Notes (Signed)
Pt has refused EKG and CT's, vehemently refusing most of treatment and care plan. Pt refused blood draw. Pt states she wants to leave. Pt admits to ETOH use tonight. Pt denies injury other than the laceration to her upper R arm. Pt seems to be more concerned regarding her car and belongings than her own well-being.
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
## 2017-09-30 NOTE — Discharge Instructions (Addendum)
Keep the wounds clean and dry recheck them in 2 days, return or see your doctor in 7 days to get the stitches out.  The Steri-Strips will fall off by themselves and the skin glue should fall off as well.  If you keep the knee clean and dry and just clean the skin gently with peroxide the Steri-Strips and skin glue will stay on longer.  Stop or decrease your cigarettes that will help the wounds heal.  We want to get the stitches out within 7 days this will decrease the scarring.  Return for any signs of infection such as increased pain redness or swelling.  Return for any other injuries or new pain.

## 2017-10-10 ENCOUNTER — Emergency Department (HOSPITAL_COMMUNITY)
Admission: EM | Admit: 2017-10-10 | Discharge: 2017-10-11 | Disposition: A | Discharge status: Other Acute Inpatient Hospital | Payer: Self-pay | Attending: Emergency Medicine | Admitting: Emergency Medicine

## 2017-10-10 ENCOUNTER — Encounter (HOSPITAL_COMMUNITY): Payer: Self-pay

## 2017-10-10 DIAGNOSIS — Z008 Encounter for other general examination: Secondary | ICD-10-CM | POA: Insufficient documentation

## 2017-10-10 DIAGNOSIS — R45851 Suicidal ideations: Secondary | ICD-10-CM | POA: Insufficient documentation

## 2017-10-10 DIAGNOSIS — Z91010 Allergy to peanuts: Secondary | ICD-10-CM | POA: Insufficient documentation

## 2017-10-10 DIAGNOSIS — J45909 Unspecified asthma, uncomplicated: Secondary | ICD-10-CM | POA: Insufficient documentation

## 2017-10-10 DIAGNOSIS — I10 Essential (primary) hypertension: Secondary | ICD-10-CM | POA: Insufficient documentation

## 2017-10-10 DIAGNOSIS — F1410 Cocaine abuse, uncomplicated: Secondary | ICD-10-CM | POA: Diagnosis present

## 2017-10-10 DIAGNOSIS — F17210 Nicotine dependence, cigarettes, uncomplicated: Secondary | ICD-10-CM | POA: Insufficient documentation

## 2017-10-10 DIAGNOSIS — Z79899 Other long term (current) drug therapy: Secondary | ICD-10-CM | POA: Insufficient documentation

## 2017-10-10 DIAGNOSIS — F333 Major depressive disorder, recurrent, severe with psychotic symptoms: Secondary | ICD-10-CM | POA: Insufficient documentation

## 2017-10-10 DIAGNOSIS — R45850 Homicidal ideations: Secondary | ICD-10-CM | POA: Insufficient documentation

## 2017-10-10 DIAGNOSIS — F332 Major depressive disorder, recurrent severe without psychotic features: Secondary | ICD-10-CM | POA: Diagnosis present

## 2017-10-10 LAB — COMPREHENSIVE METABOLIC PANEL
ALT: 18 U/L (ref 0–44)
AST: 16 U/L (ref 15–41)
Albumin: 3.6 g/dL (ref 3.5–5.0)
Alkaline Phosphatase: 57 U/L (ref 38–126)
Anion gap: 9 (ref 5–15)
BUN: 8 mg/dL (ref 6–20)
CALCIUM: 9.3 mg/dL (ref 8.9–10.3)
CO2: 27 mmol/L (ref 22–32)
CREATININE: 0.78 mg/dL (ref 0.44–1.00)
Chloride: 106 mmol/L (ref 98–111)
GFR calc non Af Amer: >60 mL/min (ref >60)
Glucose, Bld: 100 mg/dL — ABNORMAL HIGH (ref 70–99)
Potassium: 3.3 mmol/L — ABNORMAL LOW (ref 3.5–5.1)
Sodium: 142 mmol/L (ref 135–145)
TOTAL PROTEIN: 7.2 g/dL (ref 6.5–8.1)
Total Bilirubin: 0.3 mg/dL (ref 0.3–1.2)

## 2017-10-10 LAB — CBC
HCT: 35.6 % — ABNORMAL LOW (ref 36.0–46.0)
Hemoglobin: 11 g/dL — ABNORMAL LOW (ref 12.0–15.0)
MCH: 21 pg — AB (ref 26.0–34.0)
MCHC: 30.9 g/dL (ref 30.0–36.0)
MCV: 67.8 fL — ABNORMAL LOW (ref 78.0–100.0)
PLATELETS: 475 10*3/uL — AB (ref 150–400)
RBC: 5.25 MIL/uL — ABNORMAL HIGH (ref 3.87–5.11)
RDW: 17.2 % — ABNORMAL HIGH (ref 11.5–15.5)
WBC: 10.7 10*3/uL — ABNORMAL HIGH (ref 4.0–10.5)

## 2017-10-10 LAB — ETHANOL: Alcohol, Ethyl (B): <10 mg/dL (ref <10)

## 2017-10-10 LAB — SALICYLATE LEVEL

## 2017-10-10 LAB — I-STAT BETA HCG BLOOD, ED (MC, WL, AP ONLY)

## 2017-10-10 LAB — ACETAMINOPHEN LEVEL: Acetaminophen (Tylenol), Serum: <10 ug/mL — ABNORMAL LOW (ref 10–30)

## 2017-10-10 MED ORDER — TRAZODONE HCL 50 MG PO TABS
50.0000 mg | ORAL_TABLET | Freq: Every day | ORAL | Status: DC
  Administered 2017-10-10: 50 mg via ORAL
  Filled 2017-10-10: qty 1

## 2017-10-10 MED ORDER — ALBUTEROL SULFATE HFA 108 (90 BASE) MCG/ACT IN AERS: 2.0000 | INHALATION_SPRAY | Freq: Four times a day (QID) | RESPIRATORY_TRACT | Status: DC | PRN

## 2017-10-10 MED ORDER — ACETAMINOPHEN 325 MG PO TABS
650.0000 mg | ORAL_TABLET | ORAL | Status: DC | PRN
  Administered 2017-10-11: 650 mg via ORAL
  Filled 2017-10-10: qty 2

## 2017-10-10 MED ORDER — MOMETASONE FURO-FORMOTEROL FUM 200-5 MCG/ACT IN AERO
2.0000 | INHALATION_SPRAY | Freq: Two times a day (BID) | RESPIRATORY_TRACT | Status: DC
  Administered 2017-10-11: 2 via RESPIRATORY_TRACT
  Filled 2017-10-10: qty 8.8

## 2017-10-10 MED ORDER — MONTELUKAST SODIUM 10 MG PO TABS
10.0000 mg | ORAL_TABLET | Freq: Every day | ORAL | Status: DC
  Administered 2017-10-10: 10 mg via ORAL
  Filled 2017-10-10: qty 1

## 2017-10-10 MED ORDER — TOPIRAMATE 25 MG PO TABS
50.0000 mg | ORAL_TABLET | Freq: Two times a day (BID) | ORAL | Status: DC
  Administered 2017-10-10 – 2017-10-11 (×2): 50 mg via ORAL
  Filled 2017-10-10 (×2): qty 2

## 2017-10-10 MED ORDER — GABAPENTIN 300 MG PO CAPS
300.0000 mg | ORAL_CAPSULE | Freq: Two times a day (BID) | ORAL | Status: DC
  Administered 2017-10-10 – 2017-10-11 (×2): 300 mg via ORAL
  Filled 2017-10-10 (×2): qty 1

## 2017-10-10 MED ORDER — POTASSIUM CHLORIDE CRYS ER 20 MEQ PO TBCR
40.0000 meq | EXTENDED_RELEASE_TABLET | Freq: Once | ORAL | Status: AC
  Administered 2017-10-10: 40 meq via ORAL
  Filled 2017-10-10: qty 2

## 2017-10-10 MED ORDER — SULFAMETHOXAZOLE-TRIMETHOPRIM 800-160 MG PO TABS
1.0000 | ORAL_TABLET | Freq: Two times a day (BID) | ORAL | Status: DC
  Administered 2017-10-10: 1 via ORAL
  Filled 2017-10-10: qty 1

## 2017-10-10 MED ORDER — METHOCARBAMOL 500 MG PO TABS: 500.0000 mg | ORAL_TABLET | Freq: Three times a day (TID) | ORAL | Status: DC | PRN

## 2017-10-10 MED ORDER — ONDANSETRON HCL 4 MG PO TABS: 4.0000 mg | ORAL_TABLET | Freq: Three times a day (TID) | ORAL | Status: DC | PRN

## 2017-10-10 MED ORDER — AMLODIPINE BESYLATE 5 MG PO TABS
10.0000 mg | ORAL_TABLET | Freq: Every day | ORAL | Status: DC
  Administered 2017-10-11: 10 mg via ORAL
  Filled 2017-10-10: qty 2

## 2017-10-10 NOTE — ED Provider Notes (Signed)
[facility] DEPT Provider Note   CSN: [id number] Arrival date & time: 10/10/17  1854     History   Chief Complaint Chief Complaint  Patient presents with  . Suicidal    HPI Kelly Byrd is a 42 y.o. female.  HPI  42 year old female with past medical history as below here with suicidal ideation.  The patient states that over the last several weeks, she said multiple stressors at home.  She is become increasingly stressed.  She reports that she is having loss of appetite, difficulty sleeping, and general dysphoria.  She began taking about lice herself, including taking pills for going to the train tracks and sitting down in front of them.  She reports that she is been thinking about hurting other people as well.  She has had increased agitation.  She denies any medication changes.  Denies any drug use.  Currently, she states she is otherwise medically stable.  No chest pain, shortness of breath, abdominal pain, nausea, vomiting.  She is here with her husband, who she does list as a source of support.  She feels like she needs admission.  She has been previously admitted to psychiatric hospitals.  She does report a history of previous suicide attempts via overdose.  Past Medical History:  Diagnosis Date  . Asthma   . Back pain   . GERD (gastroesophageal reflux disease)   . Hypertension   . Kidney stone   . Migraines   . Sickle cell trait [facility])     Patient Active Problem List   Diagnosis Date Noted  . Lumbar radiculopathy 05/31/2017  . Hyperlipidemia 05/31/2017  . GERD (gastroesophageal reflux disease) 08/05/2016  . Fibroids 09/19/2015  . Hypertension 08/29/2015  . Asthma 08/29/2015  . Back pain 08/29/2015  . 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 Anemia, iron deficiency 10/29/2014  . Menometrorrhagia 10/29/2014  . Migraine 10/27/2014    Past Surgical History:  Procedure Laterality Date  . CESAREAN SECTION     2     OB  History    Gravida  3   Para  3   Term  3   Preterm      AB      Living  3     SAB      TAB      Ectopic      Multiple      Live Births  3            Home Medications    Prior to Admission medications   Medication Sig Start Date End Date Taking? Authorizing Provider  albuterol (PROVENTIL HFA;VENTOLIN HFA) 108 (90 Base) MCG/ACT inhaler Inhale 2 puffs into the lungs every 6 (six) hours as needed for wheezing or shortness of breath (wheezing). 08/04/17  Yes Charlott Rakes, MD  amLODipine (NORVASC) 10 MG tablet Take 1 tablet (10 mg total) by mouth daily. 08/04/17  Yes Charlott Rakes, MD  gabapentin (NEURONTIN) 300 MG capsule Take 1 capsule (300 mg total) by mouth 2 (two) times daily. 08/04/17  Yes Charlott Rakes, MD  ketorolac (TORADOL) 10 MG tablet Take 1 tablet (10 mg total) by mouth every 6 (six) hours as needed for moderate pain. 09/28/17  Yes Letitia Neri L, PA-C  methocarbamol (ROBAXIN) 500 MG tablet 1 tablet every 6 hours for muscle spasms. 09/28/17  Yes Summers, Rhonda L, PA-C  mometasone-formoterol (DULERA) 200-5 MCG/ACT AERO Inhale 2 puffs into the lungs 2 (two) times daily. 08/04/17  Yes Newlin, Enobong,  MD  montelukast (SINGULAIR) 10 MG tablet Take 1 tablet (10 mg total) by mouth at bedtime. 08/04/17  Yes Charlott Rakes, MD  sulfamethoxazole-trimethoprim (BACTRIM DS,SEPTRA DS) 800-160 MG tablet Take 1 tablet by mouth 2 (two) times daily. 09/28/17  Yes Letitia Neri L, PA-C  topiramate (TOPAMAX) 50 MG tablet Take 1 tablet (50 mg total) by mouth 2 (two) times daily. 08/04/17  Yes Charlott Rakes, MD  traZODone (DESYREL) 50 MG tablet Take 50 mg by mouth at bedtime.   Yes [provider]  atorvastatin (LIPITOR) 20 MG tablet Take 1 tablet (20 mg total) by mouth daily. Patient not taking: Reported on 10/10/2017 08/04/17   Charlott Rakes, MD  HYDROcodone-acetaminophen (NORCO/VICODIN) 5-325 MG tablet Take 1 tablet by mouth every 6 (six) hours as needed for moderate  pain. Patient not taking: Reported on 10/10/2017 09/28/17   Johnn Hai, PA-C  oxycodone-acetaminophen (PERCOCET) 2.5-325 MG tablet Take 1 tablet by mouth every 6 (six) hours as needed for pain. Patient not taking: Reported on 10/10/2017 09/30/17   Nena Polio, MD    Family History Family History  Problem Relation Age of Onset  . Hypertension Mother   . Hypertension Father   . Diabetes Mellitus II Maternal Grandmother   . Lupus Unknown     Social History Social History   Tobacco Use  . Smoking status: Current Every Day Smoker    Packs/day: 0.25    Types: Cigarettes  . Smokeless tobacco: Never Used  Substance Use Topics  . Alcohol use: Yes    Alcohol/week: 0.6 oz    Types: 1 Cans of beer per week    Comment: 2 x 40oz bud ice  . Drug use: No     Allergies   Ace inhibitors; Lisinopril; and Peanuts [peanut oil]   Review of Systems Review of Systems  Constitutional: Negative for chills, fatigue and fever.  HENT: Negative for congestion and rhinorrhea.   Eyes: Negative for visual disturbance.  Respiratory: Negative for cough, shortness of breath and wheezing.   Cardiovascular: Negative for chest pain and leg swelling.  Gastrointestinal: Negative for abdominal pain, diarrhea, nausea and vomiting.  Genitourinary: Negative for dysuria and flank pain.  Musculoskeletal: Negative for neck pain and neck stiffness.  Skin: Negative for rash and wound.  Allergic/Immunologic: Negative for immunocompromised state.  Neurological: Negative for syncope, weakness and headaches.  Psychiatric/Behavioral: Positive for decreased concentration, dysphoric mood, sleep disturbance and suicidal ideas. The patient is nervous/anxious.   All other systems reviewed and are negative.    Physical Exam Updated Vital Signs BP 138/88 (BP Location: Left Arm)   Pulse (!) 107   Temp 98.5 F (36.9 C) (Oral)   Resp 18   LMP 10/10/2017   SpO2 100%   Physical Exam  Constitutional: She is  oriented to person, place, and time. She appears well-developed and well-nourished. No distress.  HENT:  Head: Normocephalic and atraumatic.  Eyes: Conjunctivae are normal.  Neck: Neck supple.  Cardiovascular: Normal rate, regular rhythm and normal heart sounds. Exam reveals no friction rub.  No murmur heard. Pulmonary/Chest: Effort normal and breath sounds normal. No respiratory distress. She has no wheezes. She has no rales.  Abdominal: She exhibits no distension.  Musculoskeletal: She exhibits no edema.  Neurological: She is alert and oriented to person, place, and time. She exhibits normal muscle tone.  Skin: Skin is warm. Capillary refill takes less than 2 seconds.  Psychiatric: She exhibits a depressed mood. She expresses suicidal ideation.  Nursing note  and vitals reviewed.    ED Treatments / Results  Labs (all labs ordered are listed, but only abnormal results are displayed) Labs Reviewed  COMPREHENSIVE METABOLIC PANEL - Abnormal; Notable for the following components:      Result Value   Potassium 3.3 (*)    Glucose, Bld 100 (*)    All other components within normal limits  ACETAMINOPHEN LEVEL - Abnormal; Notable for the following components:   Acetaminophen (Tylenol), Serum <10 (*)    All other components within normal limits  CBC - Abnormal; Notable for the following components:   WBC 10.7 (*)    RBC 5.25 (*)    Hemoglobin 11.0 (*)    HCT 35.6 (*)    MCV 67.8 (*)    MCH 21.0 (*)    RDW 17.2 (*)    Platelets 475 (*)    All other components within normal limits  ETHANOL  SALICYLATE LEVEL  RAPID URINE DRUG SCREEN, HOSP PERFORMED  I-STAT BETA HCG BLOOD, ED ([facility], [facility], [facility] ONLY)    EKG None  Radiology No results found.  Procedures Procedures (including critical care time)  Medications Ordered in ED Medications  potassium chloride SA (K-DUR,KLOR-CON) CR tablet 40 mEq (has no administration in time range)  acetaminophen (TYLENOL) tablet 650 mg (has no  administration in time range)  ondansetron (ZOFRAN) tablet 4 mg (has no administration in time range)  traZODone (DESYREL) tablet 50 mg (has no administration in time range)  topiramate (TOPAMAX) tablet 50 mg (has no administration in time range)  montelukast (SINGULAIR) tablet 10 mg (has no administration in time range)  mometasone-formoterol (DULERA) 200-5 MCG/ACT inhaler 2 puff (2 puffs Inhalation Not Given 10/10/17 2308)  methocarbamol (ROBAXIN) tablet 500 mg (has no administration in time range)  sulfamethoxazole-trimethoprim (BACTRIM DS,SEPTRA DS) 800-160 MG per tablet 1 tablet (has no administration in time range)  gabapentin (NEURONTIN) capsule 300 mg (has no administration in time range)  amLODipine (NORVASC) tablet 10 mg (has no administration in time range)  albuterol (PROVENTIL HFA;VENTOLIN HFA) 108 (90 Base) MCG/ACT inhaler 2 puff (has no administration in time range)     Initial Impression / Assessment and Plan / ED Course  I have reviewed the triage vital signs and the nursing notes.  Pertinent labs & imaging results that were available during my care of the patient were reviewed by me and considered in my medical decision making (see chart for details).     42 year old female here with increasing acute on chronic channel depression.  Patient is medically stable and without complaints.  No apparent organic etiology.  Medically stable for psychiatric disposition.  Final Clinical Impressions(s) / ED Diagnoses   Final diagnoses:  Suicidal ideation    ED Discharge Orders    None       Duffy Bruce, MD 10/10/17 2341
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
## 2017-10-10 NOTE — ED Notes (Signed)
Bed: [facility] Expected date:  Expected time:  Means of arrival:  Comments: 

## 2017-10-11 ENCOUNTER — Other Ambulatory Visit: Payer: Self-pay

## 2017-10-11 ENCOUNTER — Inpatient Hospital Stay (HOSPITAL_COMMUNITY)
Admission: AD | Admit: 2017-10-11 | Discharge: 2017-10-14 | DRG: 885 | Disposition: A | Discharge status: Home / Self Care | Payer: Medicaid Other | Source: Intra-hospital | Attending: Psychiatry | Admitting: Psychiatry

## 2017-10-11 ENCOUNTER — Encounter (HOSPITAL_COMMUNITY): Payer: Self-pay

## 2017-10-11 DIAGNOSIS — E785 Hyperlipidemia, unspecified: Secondary | ICD-10-CM | POA: Diagnosis present

## 2017-10-11 DIAGNOSIS — Z818 Family history of other mental and behavioral disorders: Secondary | ICD-10-CM

## 2017-10-11 DIAGNOSIS — Z91010 Allergy to peanuts: Secondary | ICD-10-CM

## 2017-10-11 DIAGNOSIS — F332 Major depressive disorder, recurrent severe without psychotic features: Secondary | ICD-10-CM | POA: Diagnosis present

## 2017-10-11 DIAGNOSIS — Z79899 Other long term (current) drug therapy: Secondary | ICD-10-CM | POA: Diagnosis not present

## 2017-10-11 DIAGNOSIS — F1410 Cocaine abuse, uncomplicated: Secondary | ICD-10-CM | POA: Diagnosis present

## 2017-10-11 DIAGNOSIS — R45851 Suicidal ideations: Secondary | ICD-10-CM | POA: Diagnosis present

## 2017-10-11 DIAGNOSIS — F17210 Nicotine dependence, cigarettes, uncomplicated: Secondary | ICD-10-CM | POA: Diagnosis present

## 2017-10-11 DIAGNOSIS — Z915 Personal history of self-harm: Secondary | ICD-10-CM | POA: Diagnosis not present

## 2017-10-11 DIAGNOSIS — F410 Panic disorder [episodic paroxysmal anxiety] without agoraphobia: Secondary | ICD-10-CM | POA: Diagnosis present

## 2017-10-11 DIAGNOSIS — Z888 Allergy status to other drugs, medicaments and biological substances status: Secondary | ICD-10-CM

## 2017-10-11 DIAGNOSIS — G43909 Migraine, unspecified, not intractable, without status migrainosus: Secondary | ICD-10-CM | POA: Diagnosis present

## 2017-10-11 DIAGNOSIS — I10 Essential (primary) hypertension: Secondary | ICD-10-CM | POA: Diagnosis present

## 2017-10-11 DIAGNOSIS — R569 Unspecified convulsions: Secondary | ICD-10-CM | POA: Diagnosis present

## 2017-10-11 DIAGNOSIS — D573 Sickle-cell trait: Secondary | ICD-10-CM | POA: Diagnosis present

## 2017-10-11 DIAGNOSIS — Z634 Disappearance and death of family member: Secondary | ICD-10-CM | POA: Diagnosis not present

## 2017-10-11 DIAGNOSIS — R45850 Homicidal ideations: Secondary | ICD-10-CM | POA: Diagnosis present

## 2017-10-11 DIAGNOSIS — D72821 Monocytosis (symptomatic): Secondary | ICD-10-CM | POA: Diagnosis present

## 2017-10-11 DIAGNOSIS — J45909 Unspecified asthma, uncomplicated: Secondary | ICD-10-CM | POA: Diagnosis present

## 2017-10-11 DIAGNOSIS — M5416 Radiculopathy, lumbar region: Secondary | ICD-10-CM

## 2017-10-11 DIAGNOSIS — D649 Anemia, unspecified: Secondary | ICD-10-CM | POA: Diagnosis present

## 2017-10-11 DIAGNOSIS — F4010 Social phobia, unspecified: Secondary | ICD-10-CM | POA: Diagnosis present

## 2017-10-11 DIAGNOSIS — G4700 Insomnia, unspecified: Secondary | ICD-10-CM | POA: Diagnosis present

## 2017-10-11 DIAGNOSIS — Z7951 Long term (current) use of inhaled steroids: Secondary | ICD-10-CM

## 2017-10-11 DIAGNOSIS — D473 Essential (hemorrhagic) thrombocythemia: Secondary | ICD-10-CM | POA: Diagnosis present

## 2017-10-11 DIAGNOSIS — F4310 Post-traumatic stress disorder, unspecified: Secondary | ICD-10-CM | POA: Diagnosis present

## 2017-10-11 LAB — RAPID URINE DRUG SCREEN, HOSP PERFORMED
Amphetamines: NOT DETECTED
BENZODIAZEPINES: NOT DETECTED
Cocaine: POSITIVE — AB
Opiates: NOT DETECTED
Tetrahydrocannabinol: NOT DETECTED

## 2017-10-11 MED ORDER — CITALOPRAM HYDROBROMIDE 10 MG PO TABS
10.0000 mg | ORAL_TABLET | Freq: Every day | ORAL | Status: DC
  Administered 2017-10-12: 10 mg via ORAL
  Filled 2017-10-11 (×2): qty 1

## 2017-10-11 MED ORDER — METHOCARBAMOL 500 MG PO TABS
500.0000 mg | ORAL_TABLET | Freq: Three times a day (TID) | ORAL | Status: DC | PRN
  Administered 2017-10-14: 500 mg via ORAL
  Filled 2017-10-11: qty 1

## 2017-10-11 MED ORDER — DOUBLE ANTIBIOTIC 500-10000 UNIT/GM EX OINT
TOPICAL_OINTMENT | Freq: Two times a day (BID) | CUTANEOUS | Status: DC
  Administered 2017-10-11: 14:00:00 via TOPICAL
  Filled 2017-10-11: qty 28.4

## 2017-10-11 MED ORDER — MAGNESIUM HYDROXIDE 400 MG/5ML PO SUSP: 30.0000 mL | Freq: Every day | ORAL | Status: DC | PRN

## 2017-10-11 MED ORDER — ALBUTEROL SULFATE HFA 108 (90 BASE) MCG/ACT IN AERS: 2.0000 | INHALATION_SPRAY | Freq: Four times a day (QID) | RESPIRATORY_TRACT | Status: DC | PRN

## 2017-10-11 MED ORDER — AMLODIPINE BESYLATE 10 MG PO TABS
10.0000 mg | ORAL_TABLET | Freq: Every day | ORAL | Status: DC
  Administered 2017-10-12 – 2017-10-14 (×3): 10 mg via ORAL
  Filled 2017-10-11 (×5): qty 1

## 2017-10-11 MED ORDER — GABAPENTIN 300 MG PO CAPS
300.0000 mg | ORAL_CAPSULE | Freq: Two times a day (BID) | ORAL | Status: DC
  Administered 2017-10-11 – 2017-10-14 (×6): 300 mg via ORAL
  Filled 2017-10-11 (×5): qty 1
  Filled 2017-10-11: qty 14
  Filled 2017-10-11 (×4): qty 1
  Filled 2017-10-11: qty 14
  Filled 2017-10-11 (×2): qty 1

## 2017-10-11 MED ORDER — CITALOPRAM HYDROBROMIDE 10 MG PO TABS
10.0000 mg | ORAL_TABLET | Freq: Every day | ORAL | Status: DC
Start: 2017-10-11 — End: 2017-10-11
  Administered 2017-10-11: 10 mg via ORAL
  Filled 2017-10-11: qty 1

## 2017-10-11 MED ORDER — ATORVASTATIN CALCIUM 20 MG PO TABS
20.0000 mg | ORAL_TABLET | Freq: Every day | ORAL | Status: DC
  Administered 2017-10-11 – 2017-10-14 (×4): 20 mg via ORAL
  Filled 2017-10-11 (×4): qty 1
  Filled 2017-10-11 (×2): qty 2
  Filled 2017-10-11 (×2): qty 1

## 2017-10-11 MED ORDER — TOPIRAMATE 25 MG PO TABS
50.0000 mg | ORAL_TABLET | Freq: Two times a day (BID) | ORAL | Status: DC
  Administered 2017-10-11 – 2017-10-14 (×6): 50 mg via ORAL
  Filled 2017-10-11 (×11): qty 2

## 2017-10-11 MED ORDER — ALUM & MAG HYDROXIDE-SIMETH 200-200-20 MG/5ML PO SUSP: 30.0000 mL | ORAL | Status: DC | PRN

## 2017-10-11 MED ORDER — ONDANSETRON HCL 4 MG PO TABS: 4.0000 mg | ORAL_TABLET | Freq: Three times a day (TID) | ORAL | Status: DC | PRN

## 2017-10-11 MED ORDER — ACETAMINOPHEN 325 MG PO TABS: 650.0000 mg | ORAL_TABLET | ORAL | Status: DC | PRN

## 2017-10-11 MED ORDER — TRAZODONE HCL 50 MG PO TABS
50.0000 mg | ORAL_TABLET | Freq: Every day | ORAL | Status: DC
  Administered 2017-10-11 – 2017-10-12 (×2): 50 mg via ORAL
  Filled 2017-10-11 (×5): qty 1

## 2017-10-11 MED ORDER — ACETAMINOPHEN 325 MG PO TABS
650.0000 mg | ORAL_TABLET | Freq: Four times a day (QID) | ORAL | Status: DC | PRN
  Administered 2017-10-11 – 2017-10-13 (×2): 650 mg via ORAL
  Filled 2017-10-11 (×2): qty 2

## 2017-10-11 MED ORDER — BACITRACIN ZINC 500 UNIT/GM EX OINT
TOPICAL_OINTMENT | Freq: Two times a day (BID) | CUTANEOUS | Status: AC
  Administered 2017-10-12 (×2): via TOPICAL
  Administered 2017-10-13 (×2): 1 via TOPICAL
  Filled 2017-10-11: qty 28.35

## 2017-10-11 MED ORDER — AMLODIPINE BESYLATE 10 MG PO TABS: 10.0000 mg | ORAL_TABLET | Freq: Every day | ORAL | Status: DC

## 2017-10-11 MED ORDER — MOMETASONE FURO-FORMOTEROL FUM 200-5 MCG/ACT IN AERO
2.0000 | INHALATION_SPRAY | Freq: Two times a day (BID) | RESPIRATORY_TRACT | Status: DC
  Administered 2017-10-11 – 2017-10-14 (×6): 2 via RESPIRATORY_TRACT
  Filled 2017-10-11 (×2): qty 8.8

## 2017-10-11 MED ORDER — MONTELUKAST SODIUM 10 MG PO TABS
10.0000 mg | ORAL_TABLET | Freq: Every day | ORAL | Status: DC
  Administered 2017-10-11 – 2017-10-13 (×3): 10 mg via ORAL
  Filled 2017-10-11 (×6): qty 1

## 2017-10-11 NOTE — BH Assessment (Signed)
[facility] Assessment Progress Note  Per Buford Dresser, DO, this pt requires psychiatric hospitalization at this time.  Leonia Reader, RN, [facility] has pre-assigned pt to [facility] Rm [id number]; she will call when [facility] is ready to receive pt.  Pt has signed Voluntary Admission and Consent for Treatment, as well as Consent to Release Information to peers support specialists at [facility] in [place], and a notification call has been placed.  Signed forms have been faxed to [facility].  Pt's nurse, Diane, has been notified, and agrees to send original paperwork along with pt via [facility], and to call report to [phone].  Jalene Mullet, [place] Coordinator [phone]

## 2017-10-11 NOTE — ED Notes (Signed)
Called report to John at [facility]. Called [facility] for transport.

## 2017-10-11 NOTE — Plan of Care (Signed)
D: Pt  passive SI-contract , denies HI/AV. Pt is pleasant and cooperative. Pt stated she felt the same as when she came in, pt visible on the unit this evening. A: Pt was offered support and encouragement. Pt was given scheduled medications. Pt was encourage to attend groups. Q 15 minute checks were done for safety.  R:Pt attends groups and interacts well with peers and staff. Pt is taking medication. Pt has no complaints.Pt receptive to treatment and safety maintained on unit.   Problem: Education: Goal: Mental status will improve Outcome: Progressing   Problem: Safety: Goal: Periods of time without injury will increase Outcome: Progressing
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
## 2017-10-11 NOTE — Tx Team (Signed)
Initial Treatment Plan 10/11/2017 5:24 PM Kelly Byrd CZG:[id number]    PATIENT STRESSORS: Loss of child   PATIENT STRENGTHS: Ability for insight General fund of knowledge   PATIENT IDENTIFIED PROBLEMS: "I recently lost my son and I have been depressed."                     DISCHARGE CRITERIA:  Reduction of life-threatening or endangering symptoms to within safe limits Verbal commitment to aftercare and medication compliance  PRELIMINARY DISCHARGE PLAN: Return to previous living arrangement  PATIENT/FAMILY INVOLVEMENT: This treatment plan has been presented to and reviewed with the patient, Kelly Byrd, and/or family member, .  The patient and family have been given the opportunity to ask questions and make suggestions.  Clarita Crane, RN 10/11/2017, 5:24 PM

## 2017-10-12 ENCOUNTER — Telehealth: Payer: Self-pay

## 2017-10-12 DIAGNOSIS — R45851 Suicidal ideations: Secondary | ICD-10-CM

## 2017-10-12 DIAGNOSIS — F1410 Cocaine abuse, uncomplicated: Secondary | ICD-10-CM

## 2017-10-12 DIAGNOSIS — F332 Major depressive disorder, recurrent severe without psychotic features: Principal | ICD-10-CM

## 2017-10-12 MED ORDER — QUETIAPINE FUMARATE 50 MG PO TABS
50.0000 mg | ORAL_TABLET | Freq: Every day | ORAL | Status: DC
  Administered 2017-10-12 – 2017-10-13 (×2): 50 mg via ORAL
  Filled 2017-10-12 (×2): qty 1
  Filled 2017-10-12: qty 7
  Filled 2017-10-12 (×2): qty 1

## 2017-10-12 MED ORDER — SERTRALINE HCL 25 MG PO TABS
25.0000 mg | ORAL_TABLET | Freq: Every day | ORAL | Status: DC
  Administered 2017-10-12 – 2017-10-13 (×2): 25 mg via ORAL
  Filled 2017-10-12 (×4): qty 1

## 2017-10-12 NOTE — Plan of Care (Signed)
Nurse discussed anxiety, depression and coping skills with patient.  

## 2017-10-12 NOTE — Progress Notes (Signed)
D:  Patient's self inventory sheet, patient has poor sleep, no sleep medication given.  Fair appetite, low energy level, poor concentration.  Rated depression, anxiety and hopeless 2.  Denied withdrawals.  Denied SI.  Physical problems, leg, arm , headaches.  Worst pain in past 24 hours is #10, pain medication not helpful.  Goal is depression and feeling like not giving up.  Plans to work on what she has to do to go to groups.  No discharge plans. A:  Medications administered per MD orders.  Emotional support and encouragement given patient. R:  Denied SI and HI, contracts for safety.  Denied A/V hallucinations.  Safety maintained with 15 minute checks.

## 2017-10-12 NOTE — Plan of Care (Signed)
D: Pt denies SI/HI/AVH. Pt is pleasant and cooperative. Pt stated she was feeling better, pt appears more hopeful about the future.   A: Pt was offered support and encouragement. Pt was given scheduled medications. Pt was encourage to attend groups. Q 15 minute checks were done for safety.   R:Pt attends groups and interacts well with peers and staff. Pt is taking medication. Pt has no complaints.Pt receptive to treatment and safety maintained on unit.   Problem: Education: Goal: Emotional status will improve Outcome: Progressing   Problem: Education: Goal: Mental status will improve Outcome: Progressing   Problem: Safety: Goal: Periods of time without injury will increase Outcome: Progressing

## 2017-10-12 NOTE — Progress Notes (Signed)
Pt attended NA group this evening.  

## 2017-10-12 NOTE — Therapy (Signed)
Occupational Therapy Group Note  Date:  10/12/2017 Time:  12:58 PM  Group Topic/Focus:  Stress Management  Participation Level:  Active  Participation Quality:  Appropriate  Affect:  Flat  Cognitive:  Appropriate  Insight: Improving  Engagement in Group:  Engaged  Modes of Intervention:  Activity, Discussion, Education and Socialization  Additional Comments:    S: "I listen to music"   O: Education given on a variety of stress management coping skills in the rleation of bingo. Coping skills bingo played with education given on variety of coping skills between bingo calls. Pts encouraged to share experience with various coping skills and share what has worked for them with others. Coloring and relaxation guide handouts given at the end of the session.  ?  A: Pt presents to group with flat affect, engaged and participatory throughout. Pt explotred various stress management coping skills and engaged in bingo game with much enjoyment. Eager to accept relaxation and coloring handouts at end of session.  ?  P: Pt provided with education on stress management activities to implement into daily routine. Handouts given to facilitate carryover when reintegrating into community.   Zenovia Jarred, MSOT, OTR/L  [place] 10/12/2017, 12:58 PM
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
## 2017-10-12 NOTE — [facility] Suicide Risk Assessment (Signed)
[facility] INPATIENT:  Family/Significant Other Suicide Prevention Education  Suicide Prevention Education:  Education Completed; Shams Fill (pt's husband) [phone] has been identified by the patient as the family member/significant other with whom the patient will be residing, and identified as the person(s) who will aid the patient in the event of a mental health crisis (suicidal ideations/suicide attempt).  With written consent from the patient, the family member/significant other has been provided the following suicide prevention education, prior to the and/or following the discharge of the patient.  The suicide prevention education provided includes the following:  Suicide risk factors  Suicide prevention and interventions  National Suicide Hotline telephone number  [facility] assessment telephone number  [facility] Emergency Assistance [place] and/or Residential Mobile Crisis Unit telephone number  Request made of family/significant other to:  Remove weapons (e.g., guns, rifles, knives), all items previously/currently identified as safety concern.    Remove drugs/medications (over-the-counter, prescriptions, illicit drugs), all items previously/currently identified as a safety concern.  The family member/significant other verbalizes understanding of the suicide prevention education information provided.  The family member/significant other agrees to remove the items of safety concern listed above.  Pt's husband shared that he visited her last night and she "is looking really good." Pt's husband has no safety concerns regarding pt returning home at discharge and verified that pt does not have access to weapons/firearms. SPE and aftercare reviewed with pt's husband.   Avelina Laine LCSW 10/12/2017, 11:55 AM

## 2017-10-12 NOTE — Telephone Encounter (Signed)
Message received from Haze Justin, RN CM requesting a hospital follow up appointment for the patient at [facility]. There are no appointments available at this time and Levada Dy was informed of this.   Her PCP is Dr Margarita Rana at [facility] and the patient will need to call to schedule a hospital follow up  Appointment.; however she already has an appointment scheduled for 11/07/17 that was made prior to her ED visit.
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
## 2017-10-12 NOTE — Tx Team (Signed)
Interdisciplinary Treatment and Diagnostic Plan Update  10/12/2017 Time of Session: 0830AM Tatiyana Foucher MRN: [id number]  Principal Diagnosis: MDD, recurrent, severe Secondary Diagnoses: Active Problems:   Major depressive disorder, recurrent severe without psychotic features ([place])   Current Medications:  Current Facility-Administered Medications  Medication Dose Route Frequency Provider Last Rate Last Dose  . acetaminophen (TYLENOL) tablet 650 mg  650 mg Oral Q6H PRN Patrecia Pour, NP   650 mg at 10/11/17 1847  . albuterol (PROVENTIL HFA;VENTOLIN HFA) 108 (90 Base) MCG/ACT inhaler 2 puff  2 puff Inhalation Q6H PRN Patrecia Pour, NP      . alum & mag hydroxide-simeth (MAALOX/MYLANTA) 200-200-20 MG/5ML suspension 30 mL  30 mL Oral Q4H PRN Patrecia Pour, NP      . amLODipine (NORVASC) tablet 10 mg  10 mg Oral Daily Patrecia Pour, NP   10 mg at 10/12/17 0853  . atorvastatin (LIPITOR) tablet 20 mg  20 mg Oral Daily Patrecia Pour, NP   20 mg at 10/12/17 0853  . bacitracin ointment   Topical BID Lindon Romp A, NP      . citalopram (CELEXA) tablet 10 mg  10 mg Oral Daily Patrecia Pour, NP   10 mg at 10/12/17 0854  . gabapentin (NEURONTIN) capsule 300 mg  300 mg Oral BID Patrecia Pour, NP   300 mg at 10/12/17 0853  . magnesium hydroxide (MILK OF MAGNESIA) suspension 30 mL  30 mL Oral Daily PRN Patrecia Pour, NP      . methocarbamol (ROBAXIN) tablet 500 mg  500 mg Oral Q8H PRN Patrecia Pour, NP      . mometasone-formoterol (DULERA) 200-5 MCG/ACT inhaler 2 puff  2 puff Inhalation BID Patrecia Pour, NP   2 puff at 10/12/17 0854  . montelukast (SINGULAIR) tablet 10 mg  10 mg Oral QHS Patrecia Pour, NP   10 mg at 10/11/17 2107  . ondansetron (ZOFRAN) tablet 4 mg  4 mg Oral Q8H PRN Patrecia Pour, NP      . topiramate (TOPAMAX) tablet 50 mg  50 mg Oral BID Patrecia Pour, NP   50 mg at 10/12/17 0853  . traZODone (DESYREL) tablet 50 mg  50 mg Oral QHS Patrecia Pour, NP   50  mg at 10/11/17 2107   PTA Medications: Medications Prior to Admission  Medication Sig Dispense Refill Last Dose  . albuterol (PROVENTIL HFA;VENTOLIN HFA) 108 (90 Base) MCG/ACT inhaler Inhale 2 puffs into the lungs every 6 (six) hours as needed for wheezing or shortness of breath (wheezing). 1 Inhaler 3   . amLODipine (NORVASC) 10 MG tablet Take 1 tablet (10 mg total) by mouth daily. 30 tablet 3 10/09/2017 at Unknown time  . atorvastatin (LIPITOR) 20 MG tablet Take 1 tablet (20 mg total) by mouth daily. (Patient not taking: Reported on 10/10/2017) 30 tablet 3 Not Taking  . gabapentin (NEURONTIN) 300 MG capsule Take 1 capsule (300 mg total) by mouth 2 (two) times daily. 60 capsule 3 10/10/2017 at Unknown time  . HYDROcodone-acetaminophen (NORCO/VICODIN) 5-325 MG tablet Take 1 tablet by mouth every 6 (six) hours as needed for moderate pain. (Patient not taking: Reported on 10/10/2017) 12 tablet 0 Completed Course at Unknown time  . ketorolac (TORADOL) 10 MG tablet Take 1 tablet (10 mg total) by mouth every 6 (six) hours as needed for moderate pain. 12 tablet 0 Past Week at Unknown time  . methocarbamol (ROBAXIN) 500 MG tablet  1 tablet every 6 hours for muscle spasms. 15 tablet 0 unknown  . mometasone-formoterol (DULERA) 200-5 MCG/ACT AERO Inhale 2 puffs into the lungs 2 (two) times daily. 1 Inhaler 3   . montelukast (SINGULAIR) 10 MG tablet Take 1 tablet (10 mg total) by mouth at bedtime. 30 tablet 3 10/09/2017 at Unknown time  . topiramate (TOPAMAX) 50 MG tablet Take 1 tablet (50 mg total) by mouth 2 (two) times daily. 60 tablet 3 10/10/2017 at Unknown time  . traZODone (DESYREL) 50 MG tablet Take 50 mg by mouth at bedtime.   10/09/2017 at Unknown time    Patient Stressors: Loss of child  Patient Strengths: Ability for insight General fund of knowledge  Treatment Modalities: Medication Management, Group therapy, Case management,  1 to 1 session with clinician, Psychoeducation, Recreational  therapy.   Physician Treatment Plan for Primary Diagnosis: MDD, recurrent, severe  Medication Management: Evaluate patient's response, side effects, and tolerance of medication regimen.  Therapeutic Interventions: 1 to 1 sessions, Unit Group sessions and Medication administration.  Evaluation of Outcomes: Not Met  Physician Treatment Plan for Secondary Diagnosis: Active Problems:   Major depressive disorder, recurrent severe without psychotic features ([place])  Medication Management: Evaluate patient's response, side effects, and tolerance of medication regimen.  Therapeutic Interventions: 1 to 1 sessions, Unit Group sessions and Medication administration.  Evaluation of Outcomes: Not Met   RN Treatment Plan for Primary Diagnosis:  MDD, recurrent, severe Long Term Goal(s): Knowledge of disease and therapeutic regimen to maintain health will improve  Short Term Goals: Ability to remain free from injury will improve, Ability to verbalize frustration and anger appropriately will improve, Ability to demonstrate self-control and Ability to disclose and discuss suicidal ideas  Medication Management: RN will administer medications as ordered by provider, will assess and evaluate patient's response and provide education to patient for prescribed medication. RN will report any adverse and/or side effects to prescribing provider.  Therapeutic Interventions: 1 on 1 counseling sessions, Psychoeducation, Medication administration, Evaluate responses to treatment, Monitor vital signs and CBGs as ordered, Perform/monitor CIWA, COWS, AIMS and Fall Risk screenings as ordered, Perform wound care treatments as ordered.  Evaluation of Outcomes: Not Met   LCSW Treatment Plan for Primary Diagnosis:  MDD, recurrent, severe Long Term Goal(s): Safe transition to appropriate next level of care at discharge, Engage patient in therapeutic group addressing interpersonal concerns.  Short Term Goals: Engage  patient in aftercare planning with referrals and resources, Facilitate patient progression through stages of change regarding substance use diagnoses and concerns and Identify triggers associated with mental health/substance abuse issues  Therapeutic Interventions: Assess for all discharge needs, 1 to 1 time with Social worker, Explore available resources and support systems, Assess for adequacy in community support network, Educate family and significant other(s) on suicide prevention, Complete Psychosocial Assessment, Interpersonal group therapy.  Evaluation of Outcomes: Not Met   Progress in Treatment: Attending groups: No. New to unit. Continuing to assess.  Participating in groups: No. Taking medication as prescribed: Yes. Toleration medication: Yes. Family/Significant other contact made: No, will contact:  family member if pt consents to collateral contact. Patient understands diagnosis: Yes. Discussing patient identified problems/goals with staff: Yes. Medical problems stabilized or resolved: Yes. Denies suicidal/homicidal ideation: Yes. Issues/concerns per patient self-inventory: No. Other: n/a   New problem(s) identified: No, Describe:  n/a  New Short Term/Long Term Goal(s): detox, medication management for mood stabilization; elimination of SI thoughts; development of comprehensive mental wellness/sobriety plan.   Patient Goals:  "I  need help with grief and depression."   Discharge Plan or Barriers: CSW assessing for appropriate referrals. [place] pamphlet, Mobile Crisis information, and AA/NA information provided to patient for additional community support and resources.   Reason for Continuation of Hospitalization: Anxiety Depression Medication stabilization Suicidal ideation  Withdrawals  Estimated Length of Stay: Monday, 10/17/17  Attendees: Patient: 10/12/2017 9:22 AM  Physician: Dr. Mallie Darting MD; Dr. Leverne Humbles MD  10/12/2017 9:22 AM  Nursing: Yetta Flock RN; Hyrum RN 10/12/2017  9:22 AM  RN Care Manager:x 10/12/2017 9:22 AM  Social Worker: Janice Norrie LCSW 10/12/2017 9:22 AM  Recreational Therapist: x 10/12/2017 9:22 AM  Other: Lindell Spar NP; Benjamine Mola NP 10/12/2017 9:22 AM  Other:  10/12/2017 9:22 AM  Other: 10/12/2017 9:22 AM    Scribe for Treatment Team: Avelina Laine, LCSW 10/12/2017 9:22 AM

## 2017-10-12 NOTE — BHH Group Notes (Signed)
[facility] Group Notes:  (Nursing/MHT/Case Management/Adjunct)  Date:  10/12/2017  Time:  4:30 pm  Type of Therapy:  Psychoeducational Skills  Participation Level:  Active  Participation Quality:  Appropriate  Affect:  Appropriate  Cognitive:  Appropriate  Insight:  Appropriate  Engagement in Group:  Engaged  Modes of Intervention:  Education  Summary of Progress/Problems:  Patient was cooperative and alert during group.   Cammy Copa 10/12/2017, 5:58 PM

## 2017-10-13 ENCOUNTER — Other Ambulatory Visit: Payer: Self-pay

## 2017-10-13 ENCOUNTER — Ambulatory Visit (HOSPITAL_COMMUNITY): Payer: Self-pay

## 2017-10-13 DIAGNOSIS — G4700 Insomnia, unspecified: Secondary | ICD-10-CM

## 2017-10-13 DIAGNOSIS — F17210 Nicotine dependence, cigarettes, uncomplicated: Secondary | ICD-10-CM

## 2017-10-13 MED ORDER — TRAZODONE HCL 100 MG PO TABS
100.0000 mg | ORAL_TABLET | Freq: Every day | ORAL | Status: DC
  Administered 2017-10-13: 100 mg via ORAL
  Filled 2017-10-13 (×2): qty 1
  Filled 2017-10-13: qty 7
  Filled 2017-10-13: qty 1

## 2017-10-13 MED ORDER — HYDROXYZINE HCL 50 MG PO TABS
50.0000 mg | ORAL_TABLET | Freq: Four times a day (QID) | ORAL | Status: DC | PRN
  Administered 2017-10-13 – 2017-10-14 (×2): 50 mg via ORAL
  Filled 2017-10-13 (×3): qty 1
  Filled 2017-10-13: qty 10

## 2017-10-13 MED ORDER — SERTRALINE HCL 50 MG PO TABS
50.0000 mg | ORAL_TABLET | Freq: Every day | ORAL | Status: DC
  Administered 2017-10-14: 50 mg via ORAL
  Filled 2017-10-13: qty 7
  Filled 2017-10-13 (×3): qty 1

## 2017-10-13 NOTE — Progress Notes (Signed)
Adult Psychoeducational Group Note  Date:  10/13/2017 Time:  10:11 PM  Group Topic/Focus:  Wrap-Up Group:   The focus of this group is to help patients review their daily goal of treatment and discuss progress on daily workbooks.  Participation Level:  Active  Participation Quality:  Appropriate  Affect:  Appropriate  Cognitive:  Alert  Insight: Appropriate  Engagement in Group:  Engaged  Modes of Intervention:  Discussion  Additional Comments:  Pt stated she is starting to feel clarity and peace about her life.  Pt rated the day at 10/10.  Swade Shonka 10/13/2017, 10:11 PM

## 2017-10-13 NOTE — Plan of Care (Signed)
  Problem: Education: Goal: Knowledge of [facility] Education information/materials will improve 10/13/2017 1542 by Keane Police, RN Outcome: Progressing 10/13/2017 1541 by Keane Police, RN Outcome: Progressing   Problem: Safety: Goal: Periods of time without injury will increase Outcome: Progressing D: Pt A & O X3. Visible in milieu on initial approach. Denies SI, HI, AVH and pain 'no, not right now". Presents with flat affect and depressed mood.  Rates her depression, anxiety and hopelessness all 5/10 on self inventory sheet. Reports fair sleep last night "I could not fall asleep with that medicine last night, when I started to fall asleep; it was time to wake up". States her appetite is fair, with normal energy and good concentration level. Pt attended scheduled unit groups. Interacts well with peers and staff. A: Introduced self to pt. Emotional support and availability provided to pt. All medications administered with verbal education and effects monitored. Pt updated on changes made to current treatment regimen. Safety checks maintained at Q 15 minutes intervals without outburst or self harm gestures thus far.   R: Pt receptive to care. Compliant with medications when offered. Denies adverse drug reactions when assessed. Denies concerns at this time. Tolerates all PO intake well. Remains safe on and off unit.

## 2017-10-13 NOTE — BHH Group Notes (Signed)
LCSW Group Therapy Note  10/13/2017 1:15pm  Type of Therapy/Topic:  Group Therapy:  Balance in Life  Participation Level:  Active  Description of Group:   This group will address the concept of balance and how it feels and looks when one is unbalanced. Patients will be encouraged to process areas in their lives that are out of balance and identify reasons for remaining unbalanced. Facilitators will guide patients in utilizing problem-solving interventions to address and correct the stressor making their life unbalanced. Understanding and applying boundaries will be explored and addressed for obtaining and maintaining a balanced life. Patients will be encouraged to explore ways to assertively make their unbalanced needs known to significant others in their lives, using other group members and facilitator for support and feedback.  Therapeutic Goals: 1. Patient will identify two or more emotions or situations they have that consume much of in their lives. 2. Patient will identify signs/triggers that life has become out of balance:  3. Patient will identify two ways to set boundaries in order to achieve balance in their lives:  4. Patient will demonstrate ability to communicate their needs through discussion and/or role plays  Summary of Patient Progress: Patient was welcomed into group, and stated she felt "happy" and elaborated, stating she is feeling like more herself.Patient actively participated in group discussion about balance in life. Patient defined what balance in life meant, and identified stressors one has to balance in life (family, finances, relationships, mental health). Patient participated in group activity, where members were asked to illustrate a pie chart representing how they spend their energy on different things. Patient identified spending the most energy on her grief. Patient identified sign that life has become out of balance as, "I was crying too much and feeling lost.. I  lost myself." Patient created an additional pie chart, representing what a more balanced life would look like. Patient identified, "I am going to continue to attend [employer], go to church, and utilize my peer support.    Therapeutic Modalities:   Cognitive Behavioral Therapy Solution-Focused Therapy Assertiveness Training  Virgilio Frees, LCSW 10/13/2017 2:38 PM

## 2017-10-13 NOTE — Progress Notes (Addendum)
[facility] MD Progress Note  10/13/2017 11:05 AM Kelly Byrd  MRN:  [id number]   Subjective: " There are no changes int he way I feel. I am just depressed and I became so stressed before I came here I wanted to kill myself."    Objective: Face to face evaluation completed, case discussed with treatment team and chart reviewed. Patient's 42 year old female with a probable past psychiatric history significant for major depression and posttraumatic stress disorder who presented to the [facility] emergency department after thinking about taking an overdose of pills and also to stab her friend.  During this evaluation, patient is alert and oriented x4, calm and cooperative. She is noted lying in bed and seems to be minimally participating in group sessions, She states, " I just don't want to be around others at this time." She does note that at times, she feels paronoid when around others thinking that, " people will get me." She denies any SI at this time although admits that the thoughts are intermittent. She is contracting for safety on the unit. She endorses no improvement in depression and on observation, her mood does appear depressed and her affect is congruent. She continues  visual and auditory hallucinations that sound secondary to previous trauma (sone passing away in 2018). She reports hearing sons voice and seeing his face physically.She also reports when her depression is very high, she hears voices telling her to harm herself. She does not appear internally preoccupied. She denies concerns with current medications as she was started on Zoloft 25 mg p.o. daily for depression and  Seroquel 25 mg p.o. nightly for her hallucinations. Endorses decreased sleep and no concerns with appetite. She endorses a history of powder cocaine use although appears to be minimizing her use as she reports she has not used cocaine since the 4th of July. Her drug screen was positive for cocaine on  admission. She denies any withdrawal symptoms at this time. She endorses that she is open to grief counseling following discharge. Support and encouragement provided.           Principal Problem: Major depressive disorder, recurrent severe without psychotic features ([place]) Diagnosis:   Patient Active Problem List   Diagnosis Date Noted  . Major depressive disorder, recurrent severe without psychotic features ([place]) [F33.2] 10/11/2017  . Cocaine abuse ([place]) [F14.10] 10/11/2017  . Lumbar radiculopathy [M54.16] 05/31/2017  . Hyperlipidemia [E78.5] 05/31/2017  . GERD (gastroesophageal reflux disease) [K21.9] 08/05/2016  . Fibroids [D21.9] 09/19/2015  . Hypertension [I10] 08/29/2015  . Asthma [J45.909] 08/29/2015  . Back pain [M54.9] 08/29/2015  . 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 Anemia, iron deficiency [D50.9] 10/29/2014  . Menometrorrhagia [N92.1] 10/29/2014  . Migraine [G43.909] 10/27/2014   Total Time spent with patient: 30 minutes  Past Psychiatric History: She had no previous psychiatric admissions as an adult, but had been admitted as an adolescent to a facility in [place].     Past Medical History:  Past Medical History:  Diagnosis Date  . Asthma   . Back pain   . GERD (gastroesophageal reflux disease)   . Hypertension   . Kidney stone   . Migraines   . Sickle cell trait [facility])     Past Surgical History:  Procedure Laterality Date  . CESAREAN SECTION     2   Family History:  Family History  Problem Relation Age of Onset  . Hypertension Mother   . Hypertension Father   . Diabetes Mellitus  II Maternal Grandmother   . Lupus Unknown    Family Psychiatric  History: Depression in several members   Social History:  Social History   Substance and Sexual Activity  Alcohol Use Yes  . Alcohol/week: 0.6 oz  . Types: 1 Cans of beer per week   Comment: 2 x 40oz bud ice     Social History   Substance and Sexual Activity  Drug Use  No    Social History   Socioeconomic History  . Marital status: Married    Spouse name: Not on file  . Number of children: Not on file  . Years of education: Not on file  . Highest education level: Not on file  Occupational History  . Occupation: disabled  Social Needs  . Financial resource strain: Not on file  . Food insecurity:    Worry: Not on file    Inability: Not on file  . Transportation needs:    Medical: Not on file    Non-medical: Not on file  Tobacco Use  . Smoking status: Current Every Day Smoker    Packs/day: 0.25    Types: Cigarettes  . Smokeless tobacco: Never Used  Substance and Sexual Activity  . Alcohol use: Yes    Alcohol/week: 0.6 oz    Types: 1 Cans of beer per week    Comment: 2 x 40oz bud ice  . Drug use: No  . Sexual activity: Yes    Birth control/protection: Surgical  Lifestyle  . Physical activity:    Days per week: Not on file    Minutes per session: Not on file  . Stress: Not on file  Relationships  . Social connections:    Talks on phone: Not on file    Gets together: Not on file    Attends religious service: Not on file    Active member of club or organization: Not on file    Attends meetings of clubs or organizations: Not on file    Relationship status: Not on file  Other Topics Concern  . Not on file  Social History Narrative  . Not on file   Additional Social History:       Sleep: decreased   Appetite:  Fair  Current Medications: Current Facility-Administered Medications  Medication Dose Route Frequency Provider Last Rate Last Dose  . acetaminophen (TYLENOL) tablet 650 mg  650 mg Oral Q6H PRN Patrecia Pour, NP   650 mg at 10/13/17 4696  . albuterol (PROVENTIL HFA;VENTOLIN HFA) 108 (90 Base) MCG/ACT inhaler 2 puff  2 puff Inhalation Q6H PRN Patrecia Pour, NP      . alum & mag hydroxide-simeth (MAALOX/MYLANTA) 200-200-20 MG/5ML suspension 30 mL  30 mL Oral Q4H PRN Patrecia Pour, NP      . amLODipine (NORVASC) tablet  10 mg  10 mg Oral Daily Patrecia Pour, NP   10 mg at 10/13/17 0815  . atorvastatin (LIPITOR) tablet 20 mg  20 mg Oral Daily Patrecia Pour, NP   20 mg at 10/13/17 0817  . bacitracin ointment   Topical BID Lindon Romp A, NP   1 application at 29/52/84 0816  . gabapentin (NEURONTIN) capsule 300 mg  300 mg Oral BID Patrecia Pour, NP   300 mg at 10/13/17 0816  . magnesium hydroxide (MILK OF MAGNESIA) suspension 30 mL  30 mL Oral Daily PRN Patrecia Pour, NP      . methocarbamol (ROBAXIN) tablet 500 mg  500 mg Oral  Q8H PRN Patrecia Pour, NP      . mometasone-formoterol [facility]) 200-5 MCG/ACT inhaler 2 puff  2 puff Inhalation BID Patrecia Pour, NP   2 puff at 10/13/17 0816  . montelukast (SINGULAIR) tablet 10 mg  10 mg Oral QHS Patrecia Pour, NP   10 mg at 10/12/17 2205  . ondansetron (ZOFRAN) tablet 4 mg  4 mg Oral Q8H PRN Patrecia Pour, NP      . QUEtiapine (SEROQUEL) tablet 50 mg  50 mg Oral QHS Sharma Covert, MD   50 mg at 10/12/17 2204  . sertraline (ZOLOFT) tablet 25 mg  25 mg Oral Daily Sharma Covert, MD   25 mg at 10/13/17 0814  . topiramate (TOPAMAX) tablet 50 mg  50 mg Oral BID Patrecia Pour, NP   50 mg at 10/13/17 0814  . traZODone (DESYREL) tablet 50 mg  50 mg Oral QHS Patrecia Pour, NP   50 mg at 10/12/17 2204    Lab Results: No results found for this or any previous visit (from the past 48 hour(s)).  Blood Alcohol level:  Lab Results  Component Value Date   ETH <10 10/10/2017   ETH 171 (H) 67/89/3810    Metabolic Disorder Labs: Lab Results  Component Value Date   HGBA1C 6.5 (H) 10/26/2014   No results found for: PROLACTIN Lab Results  Component Value Date   CHOL 193 08/06/2016   TRIG 87 08/06/2016   HDL 44 08/06/2016   CHOLHDL 4.4 08/06/2016   LDLCALC 132 (H) 08/06/2016    Physical Findings: AIMS: Facial and Oral Movements Muscles of Facial Expression: None, normal Lips and Perioral Area: None, normal Jaw: None, normal Tongue: None,  normal,Extremity Movements Upper (arms, wrists, hands, fingers): None, normal Lower (legs, knees, ankles, toes): None, normal, Trunk Movements Neck, shoulders, hips: None, normal, Overall Severity Severity of abnormal movements (highest score from questions above): None, normal Incapacitation due to abnormal movements: None, normal Patient's awareness of abnormal movements (rate only patient's report): No Awareness, Dental Status Current problems with teeth and/or dentures?: No Does patient usually wear dentures?: No  CIWA:  CIWA-Ar Total: 1 COWS:  COWS Total Score: 2  Musculoskeletal: Strength & Muscle Tone: within normal limits Gait & Station: normal Patient leans: N/A  Psychiatric Specialty Exam: Physical Exam  Nursing note and vitals reviewed. Constitutional: She is oriented to person, place, and time.  Neurological: She is alert and oriented to person, place, and time.    Review of Systems  Psychiatric/Behavioral: Positive for depression and substance abuse. Negative for hallucinations, memory loss and suicidal ideas. The patient is nervous/anxious and has insomnia.   All other systems reviewed and are negative.   Blood pressure 109/75, pulse (!) 101, temperature 98.4 F (36.9 C), temperature source Oral, resp. rate 16, height 5\' 4"  (1.626 m), weight 108.9 kg (240 lb), last menstrual period 10/10/2017, SpO2 99 %.Body mass index is 41.2 kg/m.  General Appearance: Casual  Eye Contact:  Good  Speech:  Clear and Coherent and Normal Rate  Volume:  Normal  Mood:  Depressed  Affect:  Depressed  Thought Process:  Coherent, Goal Directed, Linear and Descriptions of Associations: Intact  Orientation:  Full (Time, Place, and Person)  Thought Content:  Hallucinations: Auditory  Suicidal Thoughts:  No  Homicidal Thoughts:  No  Memory:  Immediate;   Fair Recent;   Fair  Judgement:  Impaired  Insight:  Lacking  Psychomotor Activity:  Normal  Concentration:  Concentration: Fair and  Attention Span: Fair  Recall:  [employer] of Knowledge:  Fair  Language:  Good  Akathisia:  Negative  Handed:  Right  AIMS (if indicated):     Assets:  Communication Skills Desire for Improvement Resilience Social Support  ADL's:  Intact  Cognition:  WNL  Sleep:  Number of Hours: 6.5     Treatment Plan Summary: Daily contact with patient to assess and evaluate symptoms and progress in treatment   Major depression, recurrent, severe with psychotic features- Increased Zoloft to 50 mg po daily for depression. Will resume Seroquel 50 mg p.o. nightly for her hallucinations with titrations as appropriate.   Patient will continue home medications for medical conditions as noted in [facility].   Patient will remain on the adult psychiatric unit unit she is stable for discharge.   15-minute observation checks for safety will be resumed.   She will continue to  be integrated into the milieu with group participation encourage.   She will meet with social work both individually and in groups and discharge planning will continue.   Will continue encourage her to cope with grief more appropriately.   She will be encouraged to develop coping skills for grief, SI depression.   Labs: UDS positive for cocaine. Pregnancy  negative. CBC shows many abnormalities that may indicate some form of anemia. Ordered Iron studies. CMP potassium 3.3 so will repeat. Ordered TSH, HgbA1c and lipid panel.    Mordecai Maes, NP 10/13/2017, 11:05 AM

## 2017-10-14 LAB — CBC WITH DIFFERENTIAL/PLATELET
Basophils Absolute: 0 10*3/uL (ref 0.0–0.1)
Basophils Relative: 0 %
Eosinophils Absolute: 0.6 10*3/uL (ref 0.0–0.7)
Eosinophils Relative: 5 %
HEMATOCRIT: 35 % — AB (ref 36.0–46.0)
HEMOGLOBIN: 10.7 g/dL — AB (ref 12.0–15.0)
LYMPHS PCT: 24 %
Lymphs Abs: 2.9 10*3/uL (ref 0.7–4.0)
MCH: 20.6 pg — ABNORMAL LOW (ref 26.0–34.0)
MCHC: 30.6 g/dL (ref 30.0–36.0)
MCV: 67.4 fL — AB (ref 78.0–100.0)
MONOS PCT: 4 %
Monocytes Absolute: 0.5 10*3/uL (ref 0.1–1.0)
NEUTROS ABS: 8.1 10*3/uL — AB (ref 1.7–7.7)
NEUTROS PCT: 67 %
PLATELETS: 485 10*3/uL — AB (ref 150–400)
RBC: 5.19 MIL/uL — AB (ref 3.87–5.11)
RDW: 17.2 % — AB (ref 11.5–15.5)
WBC: 12.2 10*3/uL — AB (ref 4.0–10.5)

## 2017-10-14 LAB — LIPID PANEL
Cholesterol: 177 mg/dL (ref 0–200)
HDL: 45 mg/dL (ref >40)
LDL Cholesterol: 111 mg/dL — ABNORMAL HIGH (ref 0–99)
TRIGLYCERIDES: 106 mg/dL (ref <150)
Total CHOL/HDL Ratio: 3.9 RATIO
VLDL: 21 mg/dL (ref 0–40)

## 2017-10-14 LAB — HEMOGLOBIN A1C
Hgb A1c MFr Bld: 6.8 % — ABNORMAL HIGH (ref 4.8–5.6)
Mean Plasma Glucose: 148.46 mg/dL

## 2017-10-14 LAB — IRON AND TIBC
Iron: 23 ug/dL — ABNORMAL LOW (ref 28–170)
Saturation Ratios: 5 % — ABNORMAL LOW (ref 10.4–31.8)
TIBC: 445 ug/dL (ref 250–450)
UIBC: 422 ug/dL

## 2017-10-14 LAB — TSH: TSH: 3.523 u[IU]/mL (ref 0.350–4.500)

## 2017-10-14 LAB — POTASSIUM: POTASSIUM: 3.6 mmol/L (ref 3.5–5.1)

## 2017-10-14 LAB — FERRITIN: Ferritin: 8 ng/mL — ABNORMAL LOW (ref 11–307)

## 2017-10-14 MED ORDER — HYDROXYZINE HCL 50 MG PO TABS: 50.0000 mg | ORAL_TABLET | Freq: Four times a day (QID) | ORAL | 0 refills | Status: DC | PRN

## 2017-10-14 MED ORDER — GABAPENTIN 300 MG PO CAPS: 300.0000 mg | ORAL_CAPSULE | Freq: Two times a day (BID) | ORAL | 0 refills | Status: DC

## 2017-10-14 MED ORDER — QUETIAPINE FUMARATE 50 MG PO TABS: 50.0000 mg | ORAL_TABLET | Freq: Every day | ORAL | 0 refills | Status: DC

## 2017-10-14 MED ORDER — SERTRALINE HCL 50 MG PO TABS: 50.0000 mg | ORAL_TABLET | Freq: Every day | ORAL | 0 refills | Status: DC

## 2017-10-14 MED ORDER — TRAZODONE HCL 100 MG PO TABS: 100.0000 mg | ORAL_TABLET | Freq: Every day | ORAL | 0 refills | Status: AC

## 2017-10-14 NOTE — BHH Group Notes (Signed)
[place] LCSW GROUP THERAPY NOTE  Date:  10/14/2017    1:15PM  Type of Therapy and Topic:  Group Therapy:  Feelings Around Relapse and Recovery  Participation Level Active  Mood:  Pleasant, Congruent   Description of Group:  Patients in this group will discuss emotions they experience before and after a relapse. They will process how experiencing these feelings, or avoidance of experiencing them, relates to having a relapse. Facilitator will guide patients to explore emotions they have related to recover. Patients will be encouraged to process which emotions are more powerful. They will be guided to discuss the emotional reaction significant others in their lives may have to patients' relapse or recovery. Patients will be assisted in exploring ways to respond to the emotions of others without this contributing to a relapse.   Therapeutic Goals:  1.  Patient will identify two or more emotions that lead to relapse for them.  2.  Patient will identify two emotions that result when they relapse.  3.  Patient will identify two emotions related to recovery.  4.  Patient will demonstrate ability to communicate their needs through discussion and/or role plays.   Summary of Patient Progress: Patient actively participated in group discussion today. She defined relapse as getting started again, but realizing that she can get started again. Emotions she identified related to relapse are grief and disappointment. She related her emotions to her own relapse and recovery, naming depression as the biggest issue she is recovering from. She stated that her husband has been instrumental in her recovery.   Therapeutic Modalities: Cognitive Behavioral Therapy Solution-Focused Therapy Motivational Interviewing   Netta Neat, MSW, LCSW Clinical Social Work

## 2017-10-14 NOTE — Progress Notes (Signed)
Recreation Therapy Notes  Date: 7.19.19 Time: 0930 Location: [address] Dayroom  Group Topic: Stress Management  Goal Area(s) Addresses:  Patient will verbalize importance of using healthy stress management.  Patient will identify positive emotions associated with healthy stress management.   Intervention: Stress Management  Activity :  [employer].  LRT introduced the stress management technique of meditation.  LRT played a played a meditation that allowed patients to take on the characteristics of a mountain.  Patients were to listen and follow along as the meditation.  Education:  Stress Management, Discharge Planning.   Education Outcome: Acknowledges edcuation/In group clarification offered/Needs additional education  Clinical Observations/Feedback: Pt did not attend group.      Victorino Sparrow, LRT/CTRS         Ria Comment, Thatcher Doberstein A 10/14/2017 11:27 AM

## 2017-10-14 NOTE — Plan of Care (Signed)
D: Pt denies SI/HI/AVH. Pt is pleasant and cooperative. Pt stated she was doing better due to getting away from her stressors in her life, and being here has helped a lot. Pt plans to go to out patient and [facility] on D/C.  A: Pt was offered support and encouragement. Pt was given scheduled medications. Pt was encourage to attend groups. Q 15 minute checks were done for safety.  R:Pt attends groups and interacts well with peers and staff. Pt is taking medication. Pt has no complaints.Pt receptive to treatment and safety maintained on unit.   Problem: Education: Goal: Emotional status will improve Outcome: Progressing   Problem: Education: Goal: Mental status will improve Outcome: Progressing   Problem: Safety: Goal: Periods of time without injury will increase Outcome: Progressing

## 2017-10-14 NOTE — Progress Notes (Signed)
  [facility] Adult Case Management Discharge Plan :  Will you be returning to the same living situation after discharge:  Yes,  with husband At discharge, do you have transportation home?: Yes,  husband Do you have the ability to pay for your medications: No.  Release of information consent forms completed and in the chart;  Patient's signature needed at discharge.  Patient to Follow up at: Follow-up Information    [facility] Follow up on 10/19/2017.   Why:  Hospital follow-up on Wed, 7/24 at 12:30PM with a crisis Museum/gallery curator. They will set you up with a therapy/medication management appt at this time.  Contact information: [place] [address] [phone]           Next level of care provider has access to [place] and Suicide Prevention discussed: Yes,  with patient  Has patient been referred to the Quitline?: Yes, faxed on 10/14/2017  Patient has been referred for addiction treatment: N/A   Netta Neat, MSW, LCSW Clinical Social Work 10/14/2017, 3:45 PM
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
## 2017-10-14 NOTE — BHH Suicide Risk Assessment (Signed)
[facility] Discharge Suicide Risk Assessment   Principal Problem: Major depressive disorder, recurrent severe without psychotic features [facility]) Discharge Diagnoses:  Patient Active Problem List   Diagnosis Date Noted  . Major depressive disorder, recurrent severe without psychotic features ([place]) [F33.2] 10/11/2017  . Cocaine abuse ([place]) [F14.10] 10/11/2017  . Lumbar radiculopathy [M54.16] 05/31/2017  . Hyperlipidemia [E78.5] 05/31/2017  . GERD (gastroesophageal reflux disease) [K21.9] 08/05/2016  . Fibroids [D21.9] 09/19/2015  . Hypertension [I10] 08/29/2015  . Asthma [J45.909] 08/29/2015  . Back pain [M54.9] 08/29/2015  . 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 Anemia, iron deficiency [D50.9] 10/29/2014  . Menometrorrhagia [N92.1] 10/29/2014  . Migraine [G43.909] 10/27/2014    Total Time spent with patient: 30 minutes  Musculoskeletal: Strength & Muscle Tone: within normal limits Gait & Station: normal Patient leans: N/A  Psychiatric Specialty Exam: Review of Systems  All other systems reviewed and are negative.   Blood pressure 109/75, pulse (!) 101, temperature 98.4 F (36.9 C), temperature source Oral, resp. rate 16, height 5\' 4"  (1.626 m), weight 108.9 kg (240 lb), last menstrual period 10/10/2017, SpO2 99 %.Body mass index is 41.2 kg/m.  General Appearance: Casual  Eye Contact::  Good  Speech:  Clear and Coherent409  Volume:  Normal  Mood:  Euthymic  Affect:  Appropriate  Thought Process:  Coherent  Orientation:  Full (Time, Place, and Person)  Thought Content:  Logical  Suicidal Thoughts:  No  Homicidal Thoughts:  No  Memory:  Immediate;   Fair Recent;   Fair Remote;   Fair  Judgement:  Intact  Insight:  Fair  Psychomotor Activity:  Normal  Concentration:  Fair  Recall:  [employer] of Knowledge:Fair  Language: Fair  Akathisia:  Negative  Handed:  Right  AIMS (if indicated):     Assets:  Desire for Improvement Housing Physical  Health Resilience Social Support  Sleep:  Number of Hours: 6.25  Cognition: WNL  ADL's:  Intact   Mental Status Per Nursing Assessment::   On Admission:  Suicidal ideation indicated by patient  Demographic Factors:  Low socioeconomic status and Unemployed  Loss Factors: NA  Historical Factors: Impulsivity  Risk Reduction Factors:   Sense of responsibility to family and Living with another person, especially a relative  Continued Clinical Symptoms:  Alcohol/Substance Abuse/Dependencies  Cognitive Features That Contribute To Risk:  None    Suicide Risk:  Minimal: No identifiable suicidal ideation.  Patients presenting with no risk factors but with morbid ruminations; may be classified as minimal risk based on the severity of the depressive symptoms  Follow-up Information    [facility] Follow up on 10/19/2017.   Why:  Hospital follow-up on Wed, 7/24 at 12:30PM with a crisis Museum/gallery curator. They will set you up with a therapy/medication management appt at this time.  Contact information: [place] [address] [phone]           Plan Of Care/Follow-up recommendations:  Activity:  ad lib  Sharma Covert, MD 10/14/2017, 2:52 PM

## 2017-10-14 NOTE — Progress Notes (Signed)
Discharge note: Patient reviewed discharge paperwork with RN including prescriptions, follow up appointments, and lab work. Patient given the opportunity to ask questions. All concerns were addressed. All belongings were returned to patient. Denied SI/HI/AVH. Patient thanked staff for their care while at the hospital. Patient was discharged to lobby where husband was waiting to pick her up.

## 2017-10-14 NOTE — Plan of Care (Signed)
  Problem: Education: Goal: Knowledge of [facility] Education information/materials will improve Outcome: Adequate for Discharge Goal: Emotional status will improve Outcome: Adequate for Discharge Goal: Mental status will improve Outcome: Adequate for Discharge Goal: Verbalization of understanding the information provided will improve Outcome: Adequate for Discharge   Problem: Activity: Goal: Interest or engagement in activities will improve Outcome: Adequate for Discharge Goal: Sleeping patterns will improve Outcome: Adequate for Discharge   Problem: Coping: Goal: Ability to verbalize frustrations and anger appropriately will improve Outcome: Adequate for Discharge Goal: Ability to demonstrate self-control will improve Outcome: Adequate for Discharge   Problem: Health Behavior/Discharge Planning: Goal: Identification of resources available to assist in meeting health care needs will improve Outcome: Adequate for Discharge Goal: Compliance with treatment plan for underlying cause of condition will improve Outcome: Adequate for Discharge   Problem: Physical Regulation: Goal: Ability to maintain clinical measurements within normal limits will improve Outcome: Adequate for Discharge   Problem: Safety: Goal: Periods of time without injury will increase Outcome: Adequate for Discharge   
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
## 2017-10-26 ENCOUNTER — Other Ambulatory Visit: Payer: Self-pay

## 2017-10-26 ENCOUNTER — Emergency Department
Admission: EM | Admit: 2017-10-26 | Discharge: 2017-10-26 | Disposition: A | Discharge status: Home / Self Care | Payer: Self-pay | Attending: Emergency Medicine | Admitting: Emergency Medicine

## 2017-10-26 DIAGNOSIS — M79605 Pain in left leg: Secondary | ICD-10-CM | POA: Insufficient documentation

## 2017-10-26 DIAGNOSIS — N3090 Cystitis, unspecified without hematuria: Secondary | ICD-10-CM | POA: Insufficient documentation

## 2017-10-26 DIAGNOSIS — Z91010 Allergy to peanuts: Secondary | ICD-10-CM | POA: Insufficient documentation

## 2017-10-26 DIAGNOSIS — M5416 Radiculopathy, lumbar region: Secondary | ICD-10-CM | POA: Insufficient documentation

## 2017-10-26 DIAGNOSIS — I10 Essential (primary) hypertension: Secondary | ICD-10-CM | POA: Insufficient documentation

## 2017-10-26 DIAGNOSIS — F17210 Nicotine dependence, cigarettes, uncomplicated: Secondary | ICD-10-CM | POA: Insufficient documentation

## 2017-10-26 DIAGNOSIS — Z79899 Other long term (current) drug therapy: Secondary | ICD-10-CM | POA: Insufficient documentation

## 2017-10-26 DIAGNOSIS — J45909 Unspecified asthma, uncomplicated: Secondary | ICD-10-CM | POA: Insufficient documentation

## 2017-10-26 LAB — URINALYSIS, COMPLETE (UACMP) WITH MICROSCOPIC
BILIRUBIN URINE: NEGATIVE
Bacteria, UA: NONE SEEN
Glucose, UA: NEGATIVE mg/dL
HGB URINE DIPSTICK: NEGATIVE
Ketones, ur: NEGATIVE mg/dL
Nitrite: NEGATIVE
PROTEIN: NEGATIVE mg/dL
SPECIFIC GRAVITY, URINE: 1.005 (ref 1.005–1.030)
pH: 6 (ref 5.0–8.0)

## 2017-10-26 MED ORDER — OXYCODONE-ACETAMINOPHEN 5-325 MG PO TABS
1.0000 | ORAL_TABLET | Freq: Once | ORAL | Status: AC
  Administered 2017-10-26: 1 via ORAL
  Filled 2017-10-26: qty 1

## 2017-10-26 MED ORDER — KETOROLAC TROMETHAMINE 30 MG/ML IJ SOLN
30.0000 mg | Freq: Once | INTRAMUSCULAR | Status: AC
  Administered 2017-10-26: 30 mg via INTRAMUSCULAR
  Filled 2017-10-26: qty 1

## 2017-10-26 MED ORDER — METHOCARBAMOL 500 MG PO TABS: 500.0000 mg | ORAL_TABLET | Freq: Four times a day (QID) | ORAL | 0 refills | Status: AC

## 2017-10-26 MED ORDER — CEPHALEXIN 500 MG PO CAPS: 500.0000 mg | ORAL_CAPSULE | Freq: Two times a day (BID) | ORAL | 0 refills | Status: AC

## 2017-10-26 MED ORDER — TRAMADOL HCL 50 MG PO TABS: 50.0000 mg | ORAL_TABLET | Freq: Four times a day (QID) | ORAL | 0 refills | Status: DC | PRN

## 2017-10-26 MED ORDER — PREDNISONE 10 MG PO TABS: ORAL_TABLET | ORAL | 0 refills | Status: DC

## 2017-10-26 MED ORDER — OXYCODONE-ACETAMINOPHEN 5-325 MG PO TABS: 1.0000 | ORAL_TABLET | ORAL | 0 refills | Status: AC | PRN

## 2017-10-26 MED ORDER — METHOCARBAMOL 500 MG PO TABS
500.0000 mg | ORAL_TABLET | Freq: Once | ORAL | Status: AC
  Administered 2017-10-26: 500 mg via ORAL
  Filled 2017-10-26: qty 1

## 2017-10-26 NOTE — ED Provider Notes (Signed)
[facility] Emergency Department Provider Note  ____________________________________________  Time seen: Approximately 9:06 AM  I have reviewed the triage vital signs and the nursing notes.   HISTORY  Chief Complaint Back Pain    HPI Kelly Byrd is a 42 y.o. female that presents to the emergency department for evaluation of low back pain that radiates in the left leg for 1 day.  Pain shoots down the back of the left leg.  Patient states that this feels the same as her sciatica.  She has also been peeing more than normal. She is trying to get her Medicaid changed so that she is able to see orthopedics.  No bowel or bladder dysfunction or saddle paresthesias.  No fever, chills, nausea, vomiting, abdominal pain, numbness, tingling.   Past Medical History:  Diagnosis Date  . Asthma   . Back pain   . GERD (gastroesophageal reflux disease)   . Hypertension   . Kidney stone   . Migraines   . Sickle cell trait [facility])     Patient Active Problem List   Diagnosis Date Noted  . Major depressive disorder, recurrent severe without psychotic features ([place]) 10/11/2017  . Cocaine abuse ([place]) 10/11/2017  . Lumbar radiculopathy 05/31/2017  . Hyperlipidemia 05/31/2017  . GERD (gastroesophageal reflux disease) 08/05/2016  . Fibroids 09/19/2015  . Hypertension 08/29/2015  . Asthma 08/29/2015  . Back pain 08/29/2015  . 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 Anemia, iron deficiency 10/29/2014  . Menometrorrhagia 10/29/2014  . Migraine 10/27/2014    Past Surgical History:  Procedure Laterality Date  . CESAREAN SECTION     2    Prior to Admission medications   Medication Sig Start Date End Date Taking? Authorizing Provider  albuterol (PROVENTIL HFA;VENTOLIN HFA) 108 (90 Base) MCG/ACT inhaler Inhale 2 puffs into the lungs every 6 (six) hours as needed for wheezing or shortness of breath (wheezing). 08/04/17   Charlott Rakes, MD  amLODipine (NORVASC) 10  MG tablet Take 1 tablet (10 mg total) by mouth daily. 08/04/17   Charlott Rakes, MD  atorvastatin (LIPITOR) 20 MG tablet Take 1 tablet (20 mg total) by mouth daily. Patient not taking: Reported on 10/10/2017 08/04/17   Charlott Rakes, MD  cephALEXin (KEFLEX) 500 MG capsule Take 1 capsule (500 mg total) by mouth 2 (two) times daily for 10 days. 10/26/17 11/05/17  Laban Emperor, PA-C  gabapentin (NEURONTIN) 300 MG capsule Take 1 capsule (300 mg total) by mouth 2 (two) times daily. 10/14/17   Nanci Pina, FNP  hydrOXYzine (ATARAX/VISTARIL) 50 MG tablet Take 1 tablet (50 mg total) by mouth every 6 (six) hours as needed for anxiety. 10/14/17   Nanci Pina, FNP  methocarbamol (ROBAXIN) 500 MG tablet Take 1 tablet (500 mg total) by mouth 4 (four) times daily for 5 days. 10/26/17 10/31/17  Laban Emperor, PA-C  mometasone-formoterol (DULERA) 200-5 MCG/ACT AERO Inhale 2 puffs into the lungs 2 (two) times daily. 08/04/17   Charlott Rakes, MD  montelukast (SINGULAIR) 10 MG tablet Take 1 tablet (10 mg total) by mouth at bedtime. 08/04/17   Charlott Rakes, MD  oxyCODONE-acetaminophen (PERCOCET) 5-325 MG tablet Take 1 tablet by mouth every 4 (four) hours as needed for up to 2 days for severe pain. 10/26/17 10/28/17  Laban Emperor, PA-C  predniSONE (DELTASONE) 10 MG tablet Take 6 tablets on day 1, take 5 tablets on day 2, take 4 tablets on day 3, take 3 tablets on day 4, take 2 tablets on day  5, take 1 tablet on day 6 10/26/17   Laban Emperor, PA-C  QUEtiapine (SEROQUEL) 50 MG tablet Take 1 tablet (50 mg total) by mouth at bedtime. 10/14/17   Nanci Pina, FNP  sertraline (ZOLOFT) 50 MG tablet Take 1 tablet (50 mg total) by mouth daily. 10/15/17   Nanci Pina, FNP  topiramate (TOPAMAX) 50 MG tablet Take 1 tablet (50 mg total) by mouth 2 (two) times daily. 08/04/17   Charlott Rakes, MD  traZODone (DESYREL) 100 MG tablet Take 1 tablet (100 mg total) by mouth at bedtime. 10/14/17   Nanci Pina, FNP     Allergies Ace inhibitors; Lisinopril; and Peanuts [peanut oil]  Family History  Problem Relation Age of Onset  . Hypertension Mother   . Hypertension Father   . Diabetes Mellitus II Maternal Grandmother   . Lupus Unknown     Social History Social History   Tobacco Use  . Smoking status: Current Every Day Smoker    Packs/day: 0.25    Types: Cigarettes  . Smokeless tobacco: Never Used  Substance Use Topics  . Alcohol use: Yes    Alcohol/week: 0.6 oz    Types: 1 Cans of beer per week    Comment: 2 x 40oz bud ice  . Drug use: No     Review of Systems  Constitutional: No fever/chills Gastrointestinal: No abdominal pain.  No nausea, no vomiting.  Musculoskeletal: Positive for back pain.  Skin: Negative for rash, abrasions, lacerations, ecchymosis. Neurological: Negative for headaches, numbness or tingling   ____________________________________________   PHYSICAL EXAM:  VITAL SIGNS: ED Triage Vitals  Enc Vitals Group     BP 10/26/17 0835 124/76     Pulse Rate 10/26/17 0835 92     Resp --      Temp 10/26/17 0835 98.1 F (36.7 C)     Temp Source 10/26/17 0835 Oral     SpO2 10/26/17 0835 99 %     Weight 10/26/17 0836 218 lb (98.9 kg)     Height 10/26/17 0836 5\' 4"  (1.626 m)     Head Circumference --      Peak Flow --      Pain Score 10/26/17 0838 10     Pain Loc --      Pain Edu? --      Excl. in [place]? --      Constitutional: Alert and oriented. Well appearing and in no acute distress. Eyes: Conjunctivae are normal. PERRL. EOMI. Head: Atraumatic. ENT:      Ears:      Nose: No congestion/rhinnorhea.      Mouth/Throat: Mucous membranes are moist.  Neck: No stridor.  Cardiovascular: Normal rate, regular rhythm.  Good peripheral circulation. Respiratory: Normal respiratory effort without tachypnea or retractions. Lungs CTAB. Good air entry to the bases with no decreased or absent breath sounds. Gastrointestinal: Bowel sounds 4 quadrants. Soft and  nontender to palpation. No guarding or rigidity. No palpable masses. No distention.  Musculoskeletal: Full range of motion to all extremities. No gross deformities appreciated.  Tenderness to palpation over left SI joint.  Positive straight leg raise.  No foot drop.  Full range of motion of toes. Neurologic:  Normal speech and language. No gross focal neurologic deficits are appreciated.  Skin:  Skin is warm, dry and intact. No rash noted. Psychiatric: Mood and affect are normal. Speech and behavior are normal. Patient exhibits appropriate insight and judgement.   ____________________________________________   LABS (all labs ordered are  listed, but only abnormal results are displayed)  Labs Reviewed  URINALYSIS, COMPLETE (UACMP) WITH MICROSCOPIC - Abnormal; Notable for the following components:      Result Value   Color, Urine STRAW (*)    APPearance CLEAR (*)    Leukocytes, UA TRACE (*)    All other components within normal limits   ____________________________________________  EKG   ____________________________________________  RADIOLOGY   No results found.  ____________________________________________    PROCEDURES  Procedure(s) performed:    Procedures    Medications  oxyCODONE-acetaminophen (PERCOCET/ROXICET) 5-325 MG per tablet 1 tablet (1 tablet Oral Given 10/26/17 0922)  ketorolac (TORADOL) 30 MG/ML injection 30 mg (30 mg Intramuscular Given 10/26/17 0920)  methocarbamol (ROBAXIN) tablet 500 mg (500 mg Oral Given 10/26/17 0626)     ____________________________________________   INITIAL IMPRESSION / ASSESSMENT AND PLAN / ED COURSE  Pertinent labs & imaging results that were available during my care of the patient were reviewed by me and considered in my medical decision making (see chart for details).  Review of the [place] CSRS was performed in accordance of the [place] prior to dispensing any controlled drugs.   Patient's diagnosis is consistent with  sciatica.  Vital signs and exam are reassuring.  Urinalysis shows leukocytes.  Symptoms are the same as her sciatica in the past.  She was given IM Toradol, Robaxin, Percocet in ED, which is helped symptoms in the past.  Symptoms improved after medication.  She is waiting on her Medicaid to go through.  Patient will be discharged home with prescriptions for prednisone, Robaxin, Keflex, a short course of Percocet.  She has not given NSAIDs due to interaction with SSRIs.  Patient is to follow up with Ortho as directed. Patient is given ED precautions to return to the ED for any worsening or new symptoms.     ____________________________________________  FINAL CLINICAL IMPRESSION(S) / ED DIAGNOSES  Final diagnoses:  Lumbar radiculopathy  Cystitis      NEW MEDICATIONS STARTED DURING THIS VISIT:  ED Discharge Orders        Ordered    methocarbamol (ROBAXIN) 500 MG tablet  4 times daily     10/26/17 1038    cephALEXin (KEFLEX) 500 MG capsule  2 times daily     10/26/17 1038    predniSONE (DELTASONE) 10 MG tablet  Status:  Discontinued     10/26/17 1038    traMADol (ULTRAM) 50 MG tablet  Every 6 hours PRN,   Status:  Discontinued     10/26/17 1038    predniSONE (DELTASONE) 10 MG tablet     10/26/17 1123    oxyCODONE-acetaminophen (PERCOCET) 5-325 MG tablet  Every 4 hours PRN     10/26/17 1123          This chart was dictated using voice recognition software/Dragon. Despite best efforts to proofread, errors can occur which can change the meaning. Any change was purely unintentional.    Laban Emperor, PA-C 10/26/17 1558    Nena Polio, MD 10/27/17 2000

## 2017-10-26 NOTE — ED Triage Notes (Signed)
Pt c/o left lower back pain that radiates into the left leg since last night, states she has a hx of sciatica.

## 2017-10-28 ENCOUNTER — Telehealth (HOSPITAL_COMMUNITY): Payer: Self-pay

## 2017-10-28 NOTE — Telephone Encounter (Signed)
Called left a message to return call to [facility]

## 2017-11-07 ENCOUNTER — Ambulatory Visit: Payer: Self-pay | Admitting: Family Medicine

## 2018-04-04 ENCOUNTER — Other Ambulatory Visit: Payer: Self-pay

## 2018-04-04 ENCOUNTER — Encounter: Payer: Self-pay | Admitting: Emergency Medicine

## 2018-04-04 ENCOUNTER — Emergency Department
Admission: EM | Admit: 2018-04-04 | Discharge: 2018-04-04 | Disposition: A | Discharge status: Home / Self Care | Payer: Medicaid Other | Attending: Emergency Medicine | Admitting: Emergency Medicine

## 2018-04-04 DIAGNOSIS — F1410 Cocaine abuse, uncomplicated: Secondary | ICD-10-CM | POA: Insufficient documentation

## 2018-04-04 DIAGNOSIS — M5431 Sciatica, right side: Secondary | ICD-10-CM | POA: Insufficient documentation

## 2018-04-04 DIAGNOSIS — I10 Essential (primary) hypertension: Secondary | ICD-10-CM | POA: Insufficient documentation

## 2018-04-04 DIAGNOSIS — J45909 Unspecified asthma, uncomplicated: Secondary | ICD-10-CM | POA: Insufficient documentation

## 2018-04-04 DIAGNOSIS — Z79899 Other long term (current) drug therapy: Secondary | ICD-10-CM | POA: Insufficient documentation

## 2018-04-04 DIAGNOSIS — D573 Sickle-cell trait: Secondary | ICD-10-CM | POA: Insufficient documentation

## 2018-04-04 DIAGNOSIS — F17210 Nicotine dependence, cigarettes, uncomplicated: Secondary | ICD-10-CM | POA: Insufficient documentation

## 2018-04-04 DIAGNOSIS — Z91010 Allergy to peanuts: Secondary | ICD-10-CM | POA: Insufficient documentation

## 2018-04-04 DIAGNOSIS — F329 Major depressive disorder, single episode, unspecified: Secondary | ICD-10-CM | POA: Insufficient documentation

## 2018-04-04 MED ORDER — ORPHENADRINE CITRATE 30 MG/ML IJ SOLN
60.0000 mg | Freq: Two times a day (BID) | INTRAMUSCULAR | Status: DC
Start: 2018-04-04 — End: 2018-04-05
  Administered 2018-04-04: 60 mg via INTRAMUSCULAR
  Filled 2018-04-04: qty 2

## 2018-04-04 MED ORDER — TRAMADOL HCL 50 MG PO TABS
50.0000 mg | ORAL_TABLET | Freq: Once | ORAL | Status: AC
  Administered 2018-04-04: 50 mg via ORAL
  Filled 2018-04-04: qty 1

## 2018-04-04 MED ORDER — TRAMADOL HCL 50 MG PO TABS: 50.0000 mg | ORAL_TABLET | Freq: Four times a day (QID) | ORAL | 0 refills | Status: DC | PRN

## 2018-04-04 MED ORDER — CYCLOBENZAPRINE HCL 10 MG PO TABS: 10.0000 mg | ORAL_TABLET | Freq: Three times a day (TID) | ORAL | 0 refills | Status: DC | PRN

## 2018-04-04 MED ORDER — KETOROLAC TROMETHAMINE 30 MG/ML IJ SOLN
30.0000 mg | Freq: Once | INTRAMUSCULAR | Status: AC
  Administered 2018-04-04: 30 mg via INTRAMUSCULAR
  Filled 2018-04-04: qty 1

## 2018-04-04 MED ORDER — PREDNISONE 10 MG (21) PO TBPK: ORAL_TABLET | ORAL | 0 refills | Status: DC

## 2018-04-04 NOTE — ED Triage Notes (Signed)
PT c/o back pain radiating into RLE, no acute injury. Hx of sciatica

## 2018-04-04 NOTE — ED Notes (Signed)
See triage note  Presents with lower back pain for the past 3 days   States pain is mainly on the right side on is moving into right leg  Ambulates to room w/o diff

## 2018-04-04 NOTE — Discharge Instructions (Signed)
Follow up with primary care. Return to the ER for symptoms that change or worsen if unable to schedule an appointment.

## 2018-04-04 NOTE — ED Notes (Signed)
Reviewed discharge instructions, follow-up care, and prescriptions with patient. Patient verbalized understanding of all information reviewed. Patient stable, with no distress noted at this time.    

## 2018-04-04 NOTE — ED Provider Notes (Signed)
[facility] Emergency Department Provider Note ____________________________________________  Time seen: Approximately 5:59 PM  I have reviewed the triage vital signs and the nursing notes.  HISTORY  Chief Complaint Back Pain   HPI Kelly Byrd is a 43 y.o. female who presents to the emergency department for treatment and evaluation of back pain with radiation into the right lower extremity. No specific injury. She has had similar symptoms in the past and diagnosed with radiculopathy. No relief with NSAIDs, ice, heat, stretching, and towel rolls under her back.   Past Medical History:  Diagnosis Date  . Asthma   . Back pain   . GERD (gastroesophageal reflux disease)   . Hypertension   . Kidney stone   . Migraines   . Sickle cell trait [facility])     Patient Active Problem List   Diagnosis Date Noted  . Major depressive disorder, recurrent severe without psychotic features ([place]) 10/11/2017  . Cocaine abuse ([place]) 10/11/2017  . Lumbar radiculopathy 05/31/2017  . Hyperlipidemia 05/31/2017  . GERD (gastroesophageal reflux disease) 08/05/2016  . Fibroids 09/19/2015  . Hypertension 08/29/2015  . Asthma 08/29/2015  . Back pain 08/29/2015  . 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 Anemia, iron deficiency 10/29/2014  . Menometrorrhagia 10/29/2014  . Migraine 10/27/2014    Past Surgical History:  Procedure Laterality Date  . CESAREAN SECTION     2    Prior to Admission medications   Medication Sig Start Date End Date Taking? Authorizing Provider  albuterol (PROVENTIL HFA;VENTOLIN HFA) 108 (90 Base) MCG/ACT inhaler Inhale 2 puffs into the lungs every 6 (six) hours as needed for wheezing or shortness of breath (wheezing). 08/04/17   Charlott Rakes, MD  amLODipine (NORVASC) 10 MG tablet Take 1 tablet (10 mg total) by mouth daily. 08/04/17   Charlott Rakes, MD  atorvastatin (LIPITOR) 20 MG tablet Take 1 tablet (20 mg total) by mouth daily. Patient  not taking: Reported on 10/10/2017 08/04/17   Charlott Rakes, MD  cyclobenzaprine (FLEXERIL) 10 MG tablet Take 1 tablet (10 mg total) by mouth 3 (three) times daily as needed for muscle spasms. 04/04/18   Valina Maes, Johnette Abraham B, FNP  gabapentin (NEURONTIN) 300 MG capsule Take 1 capsule (300 mg total) by mouth 2 (two) times daily. 10/14/17   Starkes-Perry, Gayland Curry, FNP  hydrOXYzine (ATARAX/VISTARIL) 50 MG tablet Take 1 tablet (50 mg total) by mouth every 6 (six) hours as needed for anxiety. 10/14/17   Starkes-Perry, Gayland Curry, FNP  mometasone-formoterol (DULERA) 200-5 MCG/ACT AERO Inhale 2 puffs into the lungs 2 (two) times daily. 08/04/17   Charlott Rakes, MD  montelukast (SINGULAIR) 10 MG tablet Take 1 tablet (10 mg total) by mouth at bedtime. 08/04/17   Charlott Rakes, MD  predniSONE (STERAPRED UNI-PAK 21 TAB) 10 MG (21) TBPK tablet Take 6 tablets on the first day and decrease by 1 tablet each day until finished. 04/04/18   Axel Meas B, FNP  QUEtiapine (SEROQUEL) 50 MG tablet Take 1 tablet (50 mg total) by mouth at bedtime. 10/14/17   Starkes-Perry, Gayland Curry, FNP  sertraline (ZOLOFT) 50 MG tablet Take 1 tablet (50 mg total) by mouth daily. 10/15/17   Starkes-Perry, Gayland Curry, FNP  topiramate (TOPAMAX) 50 MG tablet Take 1 tablet (50 mg total) by mouth 2 (two) times daily. 08/04/17   Charlott Rakes, MD  traMADol (ULTRAM) 50 MG tablet Take 1 tablet (50 mg total) by mouth every 6 (six) hours as needed. 04/04/18   Victorino Dike, FNP  traZODone (DESYREL) 100 MG tablet Take 1 tablet (100 mg total) by mouth at bedtime. 10/14/17   Suella Broad, FNP    Allergies Ace inhibitors; Lisinopril; and Peanuts [peanut oil]  Family History  Problem Relation Age of Onset  . Hypertension Mother   . Hypertension Father   . Diabetes Mellitus II Maternal Grandmother   . Lupus Other     Social History Social History   Tobacco Use  . Smoking status: Current Every Day Smoker    Packs/day: 0.25    Types: Cigarettes  .  Smokeless tobacco: Never Used  Substance Use Topics  . Alcohol use: Yes    Alcohol/week: 1.0 standard drinks    Types: 1 Cans of beer per week    Comment: 2 x 40oz bud ice  . Drug use: No    Review of Systems Constitutional: Well appearing. Respiratory: Negative for dyspnea. Cardiovascular: Negative for change in skin temperature or color. Musculoskeletal:   Negative for chronic steroid use   Negative for trauma in the presence of osteoporosis  Negative for age over 27 and trauma.  Negative for constitutional symptoms, or history of cancer   Negative for pain worse at night. Skin: Negative for rash, lesion, or wound.  Genitourinary: Negative for urinary retention. Rectal: Negative for fecal incontinence or new onset constipation/bowel habit changes. Hematological/Immunilogical: Negative for immunosuppression, IV drug use, or fever Neurological: Positive for burning, tingling, numb, electric, radiating pain in the right lower extremity.                        Negative for saddle anesthesia.                        Negative for focal neurologic deficit, progressive or disabling symptoms             Negative for saddle anesthesia. ____________________________________________   PHYSICAL EXAM:  VITAL SIGNS: ED Triage Vitals  Enc Vitals Group     BP 04/04/18 1624 119/88     Pulse Rate 04/04/18 1624 81     Resp 04/04/18 1624 16     Temp 04/04/18 1624 98.4 F (36.9 C)     Temp Source 04/04/18 1624 Oral     SpO2 04/04/18 1624 100 %     Weight --      Height --      Head Circumference --      Peak Flow --      Pain Score 04/04/18 1625 10     Pain Loc --      Pain Edu? --      Excl. in [place]? --     Constitutional: Alert and oriented. Well appearing and in no acute distress. Eyes: Conjunctivae are clear without discharge or drainage.  Head: Atraumatic. Neck: Full, active range of motion. Respiratory: Respirations even and unlabored. Musculoskeletal: Limited ROM of the right  lower extremity secondary to pain, Strength 5/5 of the lower extremities as tested. Neurologic: Reflexes of the lower extremities are 2+.  Positive straight leg raise on the right side. Skin: Atraumatic.  Psychiatric: Behavior and affect are normal.  ____________________________________________   LABS (all labs ordered are listed, but only abnormal results are displayed)  Labs Reviewed - No data to display ____________________________________________  RADIOLOGY  Not indicated ____________________________________________   PROCEDURES  Procedure(s) performed:  Procedures ____________________________________________   INITIAL IMPRESSION / ASSESSMENT AND PLAN / ED COURSE  Kelly Byrd is a 43  y.o. female who presents to the emergency department for treatment and evaluation of right side back pain that radiates into the right lower extremity that is similar to previous sciatica flares.  While here, she was given Toradol with some reduction of pain.  She will be given prescriptions for prednisone, tramadol, and cyclobenzaprine.  She is to call and schedule follow-up appointment with orthopedics for symptoms that are not improving over the week or so.  She was encouraged to return to the emergency department for symptoms of change or worsen if unable to schedule appointment.  Medications  ketorolac (TORADOL) 30 MG/ML injection 30 mg (30 mg Intramuscular Given 04/04/18 1813)  traMADol (ULTRAM) tablet 50 mg (50 mg Oral Given 04/04/18 2113)    ED Discharge Orders         Ordered    predniSONE (STERAPRED UNI-PAK 21 TAB) 10 MG (21) TBPK tablet     04/04/18 2106    traMADol (ULTRAM) 50 MG tablet  Every 6 hours PRN     04/04/18 2106    cyclobenzaprine (FLEXERIL) 10 MG tablet  3 times daily PRN     04/04/18 2106           Pertinent labs & imaging results that were available during my care of the patient were reviewed by me and considered in my medical decision making (see chart for  details).  _________________________________________   FINAL CLINICAL IMPRESSION(S) / ED DIAGNOSES  Final diagnoses:  Sciatica of right side     If controlled substance prescribed during this visit, 12 month history viewed on the [place] prior to issuing an initial prescription for Schedule II or III opiod.    Victorino Dike, FNP 04/05/18 7591    Arta Silence, MD 04/05/18 1506

## 2018-04-07 ENCOUNTER — Emergency Department
Admission: EM | Admit: 2018-04-07 | Discharge: 2018-04-07 | Disposition: A | Discharge status: Home / Self Care | Payer: Self-pay | Attending: Emergency Medicine | Admitting: Emergency Medicine

## 2018-04-07 ENCOUNTER — Encounter: Payer: Self-pay | Admitting: Radiology

## 2018-04-07 ENCOUNTER — Other Ambulatory Visit: Payer: Self-pay

## 2018-04-07 ENCOUNTER — Emergency Department: Payer: Self-pay

## 2018-04-07 DIAGNOSIS — R079 Chest pain, unspecified: Secondary | ICD-10-CM | POA: Insufficient documentation

## 2018-04-07 DIAGNOSIS — J45909 Unspecified asthma, uncomplicated: Secondary | ICD-10-CM | POA: Insufficient documentation

## 2018-04-07 DIAGNOSIS — F1410 Cocaine abuse, uncomplicated: Secondary | ICD-10-CM | POA: Insufficient documentation

## 2018-04-07 DIAGNOSIS — I10 Essential (primary) hypertension: Secondary | ICD-10-CM | POA: Insufficient documentation

## 2018-04-07 DIAGNOSIS — F329 Major depressive disorder, single episode, unspecified: Secondary | ICD-10-CM | POA: Insufficient documentation

## 2018-04-07 DIAGNOSIS — D573 Sickle-cell trait: Secondary | ICD-10-CM | POA: Insufficient documentation

## 2018-04-07 DIAGNOSIS — Z91010 Allergy to peanuts: Secondary | ICD-10-CM | POA: Insufficient documentation

## 2018-04-07 DIAGNOSIS — F17210 Nicotine dependence, cigarettes, uncomplicated: Secondary | ICD-10-CM | POA: Insufficient documentation

## 2018-04-07 LAB — URINALYSIS, COMPLETE (UACMP) WITH MICROSCOPIC
BACTERIA UA: NONE SEEN
Bilirubin Urine: NEGATIVE
Glucose, UA: NEGATIVE mg/dL
HGB URINE DIPSTICK: NEGATIVE
KETONES UR: NEGATIVE mg/dL
LEUKOCYTES UA: NEGATIVE
NITRITE: NEGATIVE
PROTEIN: NEGATIVE mg/dL
Specific Gravity, Urine: 1.045 — ABNORMAL HIGH (ref 1.005–1.030)
pH: 7 (ref 5.0–8.0)

## 2018-04-07 LAB — BASIC METABOLIC PANEL
Anion gap: 7 (ref 5–15)
BUN: 8 mg/dL (ref 6–20)
CHLORIDE: 105 mmol/L (ref 98–111)
CO2: 24 mmol/L (ref 22–32)
CREATININE: 0.68 mg/dL (ref 0.44–1.00)
Calcium: 8.9 mg/dL (ref 8.9–10.3)
GFR calc Af Amer: >60 mL/min (ref >60)
Glucose, Bld: 156 mg/dL — ABNORMAL HIGH (ref 70–99)
Potassium: 3.4 mmol/L — ABNORMAL LOW (ref 3.5–5.1)
SODIUM: 136 mmol/L (ref 135–145)

## 2018-04-07 LAB — CBC
HEMATOCRIT: 29.5 % — AB (ref 36.0–46.0)
Hemoglobin: 8.1 g/dL — ABNORMAL LOW (ref 12.0–15.0)
MCH: 17 pg — AB (ref 26.0–34.0)
MCHC: 27.5 g/dL — AB (ref 30.0–36.0)
MCV: 61.8 fL — ABNORMAL LOW (ref 80.0–100.0)
Platelets: 383 10*3/uL (ref 150–400)
RBC: 4.77 MIL/uL (ref 3.87–5.11)
RDW: 20.7 % — ABNORMAL HIGH (ref 11.5–15.5)
WBC: 13.8 10*3/uL — ABNORMAL HIGH (ref 4.0–10.5)
nRBC: 0 % (ref 0.0–0.2)

## 2018-04-07 LAB — TROPONIN I
Troponin I: <0.03 ng/mL (ref <0.03)
Troponin I: <0.03 ng/mL (ref <0.03)

## 2018-04-07 LAB — POC URINE PREG, ED: Preg Test, Ur: NEGATIVE

## 2018-04-07 MED ORDER — IOHEXOL 350 MG/ML SOLN
75.0000 mL | Freq: Once | INTRAVENOUS | Status: AC | PRN
  Administered 2018-04-07: 75 mL via INTRAVENOUS
  Filled 2018-04-07: qty 75

## 2018-04-07 MED ORDER — SODIUM CHLORIDE 0.9 % IV BOLUS
1000.0000 mL | Freq: Once | INTRAVENOUS | Status: AC
  Administered 2018-04-07: 1000 mL via INTRAVENOUS

## 2018-04-07 MED ORDER — NITROGLYCERIN 0.4 MG SL SUBL
0.4000 mg | SUBLINGUAL_TABLET | SUBLINGUAL | Status: DC | PRN
  Administered 2018-04-07 (×3): 0.4 mg via SUBLINGUAL
  Filled 2018-04-07: qty 1

## 2018-04-07 MED ORDER — ASPIRIN 81 MG PO CHEW
324.0000 mg | CHEWABLE_TABLET | Freq: Once | ORAL | Status: AC
  Administered 2018-04-07: 324 mg via ORAL
  Filled 2018-04-07: qty 4

## 2018-04-07 NOTE — Discharge Instructions (Addendum)
Please return to the emergency department if you develop severe pain, lightheadedness or fainting, shortness of breath, fever, or any other symptoms concerning to you.

## 2018-04-07 NOTE — ED Provider Notes (Addendum)
[facility] Emergency Department Provider Note  ____________________________________________  Time seen: Approximately 7:22 PM  I have reviewed the triage vital signs and the nursing notes.   HISTORY  Chief Complaint Chest Pain    HPI Kelly Byrd is a 43 y.o. female w/ a hx of HTN, former cocaine use, tobacco abuse, presenting w/ chest pain.  The patient reports that she was riding in a truck yesterday when she developed a "sharp achy" pain in the center of the chest that radiated down under her left breast into her back.  She has continued to have pain since then without any associated shortness of breath, palpitations, lightheadedness or syncope, diaphoresis, nausea or vomiting.  She feels her pain is worse when she takes deep breaths and better if she rubs it.  She denies any lower extremity swelling or calf pain.  She reports she underwent stress testing less than 6 months ago which was reportedly negative; unfortunately after chart review the results are not available in our computer system.  SH: Tobacco abuse and cocaine use.  FH: Daughter with blood clots and sickle cell    Past Medical History:  Diagnosis Date  . Asthma   . Back pain   . GERD (gastroesophageal reflux disease)   . Hypertension   . Kidney stone   . Migraines   . Sickle cell trait [facility])     Patient Active Problem List   Diagnosis Date Noted  . Major depressive disorder, recurrent severe without psychotic features ([place]) 10/11/2017  . Cocaine abuse ([place]) 10/11/2017  . Lumbar radiculopathy 05/31/2017  . Hyperlipidemia 05/31/2017  . GERD (gastroesophageal reflux disease) 08/05/2016  . Fibroids 09/19/2015  . Hypertension 08/29/2015  . Asthma 08/29/2015  . Back pain 08/29/2015  . 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 Anemia, iron deficiency 10/29/2014  . Menometrorrhagia 10/29/2014  . Migraine 10/27/2014    Past Surgical History:  Procedure Laterality Date  .  CESAREAN SECTION     2    Current Outpatient Rx  . Order #: [id number] Class: Print  . Order #: [id number] Class: Print  . Order #: [id number] Class: Print  . Order #: [id number] Class: Normal  . Order #: [id number] Class: Print  . Order #: [id number] Class: Print  . Order #: [id number] Class: Print  . Order #: [id number] Class: Print  . Order #: [id number] Class: Normal  . Order #: [id number] Class: Print  . Order #: [id number] Class: Print  . Order #: [id number] Class: Print  . Order #: [id number] Class: Normal  . Order #: [id number] Class: Print    Allergies Ace inhibitors; Lisinopril; and Peanuts [peanut oil]  Family History  Problem Relation Age of Onset  . Hypertension Mother   . Hypertension Father   . Diabetes Mellitus II Maternal Grandmother   . Lupus Other     Social History Social History   Tobacco Use  . Smoking status: Current Every Day Smoker    Packs/day: 0.25    Types: Cigarettes  . Smokeless tobacco: Never Used  Substance Use Topics  . Alcohol use: Yes    Alcohol/week: 1.0 standard drinks    Types: 1 Cans of beer per week    Comment: 2 x 40oz bud ice  . Drug use: No    Review of Systems Constitutional: No fever/chills.  No lightheadedness or syncope.  No diaphoresis. Eyes: No visual changes. ENT: No sore throat. No congestion or rhinorrhea. Cardiovascular: Positive chest pain. Denies palpitations. Respiratory: Denies shortness of breath.  No cough. Gastrointestinal: No abdominal pain.  No nausea, no  vomiting.  No diarrhea.  No constipation. Genitourinary: Negative for dysuria. Musculoskeletal: Negative for back pain. Skin: Negative for rash. Neurological: Negative for headaches. No focal numbness, tingling or weakness.     ____________________________________________   PHYSICAL EXAM:  VITAL SIGNS: ED Triage Vitals  Enc Vitals Group     BP 04/07/18 1756 131/65     Pulse Rate 04/07/18 1756 84     Resp --      Temp 04/07/18 1756 98 F (36.7 C)     Temp  Source 04/07/18 1756 Oral     SpO2 04/07/18 1756 97 %     Weight 04/07/18 1756 220 lb (99.8 kg)     Height 04/07/18 1756 5\' 4"  (1.626 m)     Head Circumference --      Peak Flow --      Pain Score 04/07/18 1802 10     Pain Loc --      Pain Edu? --      Excl. in [place]? --     Constitutional: Alert and oriented. Answers questions appropriately.  The patient is actively rubbing her left chest on my examination. Eyes: Conjunctivae are normal.  EOMI. No scleral icterus. Head: Atraumatic. Nose: No congestion/rhinnorhea. Mouth/Throat: Mucous membranes are moist.  Neck: No stridor.  Supple.  No JVD.  No meningismus. Cardiovascular: Normal rate, regular rhythm. No murmurs, rubs or gallops.  No overlying skin changes on the chest. Respiratory: Normal respiratory effort.  No accessory muscle use or retractions. Lungs CTAB.  No wheezes, rales or ronchi. Gastrointestinal: Overweight.  Soft, nontender and nondistended.  No guarding or rebound.  No peritoneal signs. Musculoskeletal: No LE edema. No ttp in the calves or palpable cords.  Negative Homan's sign. Neurologic:  A&Ox3.  Speech is clear.  Face and smile are symmetric.  EOMI.  Moves all extremities well. Skin:  Skin is warm, dry and intact. No rash noted. Psychiatric: Mood and affect are normal. Speech and behavior are normal.  Normal judgement.  ____________________________________________   LABS (all labs ordered are listed, but only abnormal results are displayed)  Labs Reviewed  BASIC METABOLIC PANEL - Abnormal; Notable for the following components:      Result Value   Potassium 3.4 (*)    Glucose, Bld 156 (*)    All other components within normal limits  CBC - Abnormal; Notable for the following components:   WBC 13.8 (*)    Hemoglobin 8.1 (*)    HCT 29.5 (*)    MCV 61.8 (*)    MCH 17.0 (*)    MCHC 27.5 (*)    RDW 20.7 (*)    All other components within normal limits  TROPONIN I  URINALYSIS, COMPLETE (UACMP) WITH MICROSCOPIC   TROPONIN I  POC URINE PREG, ED   ____________________________________________  EKG  ED ECG REPORT I, Anne-Caroline Mariea Clonts, the attending physician, personally viewed and interpreted this ECG.   Date: 04/07/2018  EKG Time: 1759  Rate: 83  Rhythm: normal sinus rhythm  Axis: normal  Intervals:none  ST&T Change: No STEMI  ____________________________________________  RADIOLOGY  Dg Chest 2 View  Result Date: 04/07/2018 CLINICAL DATA:  Progressive chest pain. Pain is greatest under the left breast extending into the back. EXAM: CHEST - 2 VIEW COMPARISON:  Two-view chest x-ray 04/11/2017 FINDINGS: The heart size and mediastinal contours are within normal limits. Both lungs are clear. The visualized skeletal structures are unremarkable. IMPRESSION: Negative two view chest x-ray Electronically Signed   By: San Morelle  M.D.   On: 04/07/2018 18:34   Ct Angio Chest Pe W And/or Wo Contrast  Result Date: 04/07/2018 CLINICAL DATA:  Throbbing pain in the chest since yesterday. Worse today. Central chest and under left breast pain radiating to the back. EXAM: CT ANGIOGRAPHY CHEST WITH CONTRAST TECHNIQUE: Multidetector CT imaging of the chest was performed using the standard protocol during bolus administration of intravenous contrast. Multiplanar CT image reconstructions and MIPs were obtained to evaluate the vascular anatomy. CONTRAST:  69mL OMNIPAQUE IOHEXOL 350 MG/ML SOLN COMPARISON:  None. FINDINGS: Cardiovascular: There is moderately good opacification of the central and segmental pulmonary arteries. No focal filling defects. No evidence of significant pulmonary embolus. Normal heart size. No pericardial effusions. Normal caliber thoracic aorta. No aortic dissection. Great vessel origins are patent. Mediastinum/Nodes: Borderline prominent lymph node in the left thoracic inlet measuring about 10 mm short axis dimension. Nonspecific but probably reactive. No other significant  lymphadenopathy. Esophagus is decompressed. Lungs/Pleura: Lungs are clear. No airspace disease or consolidation. No pleural effusions. No pneumothorax. Airways are patent. Upper Abdomen: No acute process demonstrated in the upper abdomen. Musculoskeletal: No chest wall abnormality. No acute or significant osseous findings. Review of the MIP images confirms the above findings. IMPRESSION: 1. No evidence of significant pulmonary embolus. 2. No evidence of active pulmonary disease. Electronically Signed   By: Lucienne Capers M.D.   On: 04/07/2018 20:15    ____________________________________________   PROCEDURES  Procedure(s) performed: None  Procedures  Critical Care performed: No ____________________________________________   INITIAL IMPRESSION / ASSESSMENT AND PLAN / ED COURSE  Pertinent labs & imaging results that were available during my care of the patient were reviewed by me and considered in my medical decision making (see chart for details).  43 y.o. female with a history of hypertension, tobacco abuse, former cocaine abuse, presenting with left-sided chest pain.  Overall, the patient is hemodynamically stable.  She has been having constant chest pain since yesterday and her first troponin is negative; also reassuring, her EKG does not show ischemic changes or arrhythmia.  I will get a second troponin.  Given that the patient has a pleuritic component to her chest pain, will get a CT to rule out PE.  The patient will be treated with aspirin and nitroglycerin.  Aortic pathology is considered but less likely.  The patient is not having any infectious symptoms.  Plan reevaluation for final disposition.  ----------------------------------------- 8:26 PM on 04/07/2018 -----------------------------------------  CT scan does not show any pneumonia.  I am awaiting the results of her second troponin.  If it is negative, I will plan to discharge her home with close PMD follow-up.  Return  precautions as well as follow-up instructions were discussed.  ____________________________________________  FINAL CLINICAL IMPRESSION(S) / ED DIAGNOSES  Final diagnoses:  Left-sided chest pain         NEW MEDICATIONS STARTED DURING THIS VISIT:  New Prescriptions   No medications on file      Eula Listen, MD 04/07/18 1927    Eula Listen, MD 04/07/18 2028

## 2018-04-07 NOTE — ED Triage Notes (Signed)
Pt states throbbing in her chest since yesterday, today it has gotten worse.pt points to center of chest under left breast and through to her back

## 2018-04-19 ENCOUNTER — Ambulatory Visit: Payer: Self-pay | Attending: Family Medicine | Admitting: Family Medicine

## 2018-04-19 ENCOUNTER — Encounter: Payer: Self-pay | Admitting: Family Medicine

## 2018-04-19 VITALS — BP 133/89 | HR 78 | Temp 97.8°F | Ht 64.0 in | Wt 240.8 lb

## 2018-04-19 DIAGNOSIS — I10 Essential (primary) hypertension: Secondary | ICD-10-CM | POA: Insufficient documentation

## 2018-04-19 DIAGNOSIS — F332 Major depressive disorder, recurrent severe without psychotic features: Secondary | ICD-10-CM | POA: Insufficient documentation

## 2018-04-19 DIAGNOSIS — J4520 Mild intermittent asthma, uncomplicated: Secondary | ICD-10-CM | POA: Insufficient documentation

## 2018-04-19 DIAGNOSIS — G43809 Other migraine, not intractable, without status migrainosus: Secondary | ICD-10-CM | POA: Insufficient documentation

## 2018-04-19 DIAGNOSIS — E7800 Pure hypercholesterolemia, unspecified: Secondary | ICD-10-CM | POA: Insufficient documentation

## 2018-04-19 DIAGNOSIS — J45909 Unspecified asthma, uncomplicated: Secondary | ICD-10-CM | POA: Insufficient documentation

## 2018-04-19 DIAGNOSIS — Z79899 Other long term (current) drug therapy: Secondary | ICD-10-CM | POA: Insufficient documentation

## 2018-04-19 DIAGNOSIS — Z23 Encounter for immunization: Secondary | ICD-10-CM | POA: Insufficient documentation

## 2018-04-19 DIAGNOSIS — M93001 Unspecified slipped upper femoral epiphysis (nontraumatic), right hip: Secondary | ICD-10-CM | POA: Insufficient documentation

## 2018-04-19 DIAGNOSIS — M5416 Radiculopathy, lumbar region: Secondary | ICD-10-CM

## 2018-04-19 DIAGNOSIS — M5116 Intervertebral disc disorders with radiculopathy, lumbar region: Secondary | ICD-10-CM | POA: Insufficient documentation

## 2018-04-19 DIAGNOSIS — Z888 Allergy status to other drugs, medicaments and biological substances status: Secondary | ICD-10-CM | POA: Insufficient documentation

## 2018-04-19 DIAGNOSIS — Z91010 Allergy to peanuts: Secondary | ICD-10-CM | POA: Insufficient documentation

## 2018-04-19 MED ORDER — ATORVASTATIN CALCIUM 20 MG PO TABS: 20.0000 mg | ORAL_TABLET | Freq: Every day | ORAL | 3 refills | Status: DC

## 2018-04-19 MED ORDER — CYCLOBENZAPRINE HCL 10 MG PO TABS: 10.0000 mg | ORAL_TABLET | Freq: Two times a day (BID) | ORAL | 3 refills | Status: DC | PRN

## 2018-04-19 MED ORDER — TOPIRAMATE 50 MG PO TABS: 100.0000 mg | ORAL_TABLET | Freq: Two times a day (BID) | ORAL | 3 refills | Status: DC

## 2018-04-19 MED ORDER — GABAPENTIN 300 MG PO CAPS: 300.0000 mg | ORAL_CAPSULE | Freq: Two times a day (BID) | ORAL | 3 refills | Status: DC

## 2018-04-19 MED ORDER — MOMETASONE FURO-FORMOTEROL FUM 200-5 MCG/ACT IN AERO: 2.0000 | INHALATION_SPRAY | Freq: Two times a day (BID) | RESPIRATORY_TRACT | 3 refills | Status: DC

## 2018-04-19 MED ORDER — ALBUTEROL SULFATE HFA 108 (90 BASE) MCG/ACT IN AERS: 2.0000 | INHALATION_SPRAY | Freq: Four times a day (QID) | RESPIRATORY_TRACT | 6 refills | Status: DC | PRN

## 2018-04-19 MED ORDER — AMLODIPINE BESYLATE 10 MG PO TABS: 10.0000 mg | ORAL_TABLET | Freq: Every day | ORAL | 3 refills | Status: DC

## 2018-04-19 MED ORDER — ACETAMINOPHEN-CODEINE #3 300-30 MG PO TABS: 1.0000 | ORAL_TABLET | Freq: Two times a day (BID) | ORAL | 2 refills | Status: DC | PRN

## 2018-04-19 MED ORDER — MONTELUKAST SODIUM 10 MG PO TABS: 10.0000 mg | ORAL_TABLET | Freq: Every day | ORAL | 3 refills | Status: DC

## 2018-04-19 NOTE — Progress Notes (Signed)
Subjective:  Patient ID: Kelly Byrd, female    DOB: 1975/06/24  Age: 43 y.o. MRN: [id number]  CC: Hospitalization Follow-up and Back Pain   HPI Kelly Byrd is a 43 year old female with a history of hypertension, asthma, migraine, anemia (secondary to menorrhagia from fibroids), chronic low back pain from lumbar degenerative disc disease, slipped capital femoral epiphysis who comes into the clinic for a follow up visit.  Her right hip and lower back have been giving her a fit with resulting cutting back in her activities as it limits her ambulation, affects her ability to sit on the toilet and she has become somewhat sedentary. This has her depressed. She has been unable to see Ortho as she has just family planning medicaid and is applying for disability. Her pain radiates down her right lower extremity, has worsened her Depression coupled with other stressors including the loss of her son who was murdered. Her migraines are uncontrolled as well. She sees Psych for management of Depression. Asthma has been stable.  She had an ED visit for chest pain where she presented to [facility] regional ED and was ruled out for ACS and PE with negative Troponins, negative EKG and negative CTA of the chest.  Past Medical History:  Diagnosis Date  . Asthma   . Back pain   . GERD (gastroesophageal reflux disease)   . Hypertension   . Kidney stone   . Migraines   . Sickle cell trait [facility])     Past Surgical History:  Procedure Laterality Date  . CESAREAN SECTION     2    Allergies  Allergen Reactions  . Ace Inhibitors Swelling  . Lisinopril     Swelling--caused the pt to stay in the hospital  . Peanuts [Peanut Oil]     Swelling--caused the pt to stay in the hospital     Outpatient Medications Prior to Visit  Medication Sig Dispense Refill  . hydrOXYzine (ATARAX/VISTARIL) 50 MG tablet Take 1 tablet (50 mg total) by mouth every 6 (six) hours as needed for anxiety. 30 tablet 0  .  QUEtiapine (SEROQUEL) 50 MG tablet Take 1 tablet (50 mg total) by mouth at bedtime. 30 tablet 0  . sertraline (ZOLOFT) 50 MG tablet Take 1 tablet (50 mg total) by mouth daily. 30 tablet 0  . traZODone (DESYREL) 100 MG tablet Take 1 tablet (100 mg total) by mouth at bedtime. 30 tablet 0  . albuterol (PROVENTIL HFA;VENTOLIN HFA) 108 (90 Base) MCG/ACT inhaler Inhale 2 puffs into the lungs every 6 (six) hours as needed for wheezing or shortness of breath (wheezing). 1 Inhaler 3  . amLODipine (NORVASC) 10 MG tablet Take 1 tablet (10 mg total) by mouth daily. 30 tablet 3  . cyclobenzaprine (FLEXERIL) 10 MG tablet Take 1 tablet (10 mg total) by mouth 3 (three) times daily as needed for muscle spasms. 30 tablet 0  . gabapentin (NEURONTIN) 300 MG capsule Take 1 capsule (300 mg total) by mouth 2 (two) times daily. 60 capsule 0  . mometasone-formoterol (DULERA) 200-5 MCG/ACT AERO Inhale 2 puffs into the lungs 2 (two) times daily. 1 Inhaler 3  . montelukast (SINGULAIR) 10 MG tablet Take 1 tablet (10 mg total) by mouth at bedtime. 30 tablet 3  . topiramate (TOPAMAX) 50 MG tablet Take 1 tablet (50 mg total) by mouth 2 (two) times daily. 60 tablet 3  . traMADol (ULTRAM) 50 MG tablet Take 1 tablet (50 mg total) by mouth every 6 (six) hours as needed.  12 tablet 0  . atorvastatin (LIPITOR) 20 MG tablet Take 1 tablet (20 mg total) by mouth daily. (Patient not taking: Reported on 10/10/2017) 30 tablet 3  . predniSONE (STERAPRED UNI-PAK 21 TAB) 10 MG (21) TBPK tablet Take 6 tablets on the first day and decrease by 1 tablet each day until finished. (Patient not taking: Reported on 04/19/2018) 21 tablet 0   No facility-administered medications prior to visit.     ROS Review of Systems  Constitutional: Negative for activity change, appetite change and fatigue.  HENT: Negative for congestion, sinus pressure and sore throat.   Eyes: Negative for visual disturbance.  Respiratory: Negative for cough, chest tightness,  shortness of breath and wheezing.   Cardiovascular: Negative for chest pain and palpitations.  Gastrointestinal: Negative for abdominal distention, abdominal pain and constipation.  Endocrine: Negative for polydipsia.  Genitourinary: Negative for dysuria and frequency.  Musculoskeletal: Positive for back pain. Negative for arthralgias.  Skin: Negative for rash.  Neurological: Negative for tremors, light-headedness and numbness.  Hematological: Does not bruise/bleed easily.  Psychiatric/Behavioral: Positive for dysphoric mood. Negative for agitation and behavioral problems.    Objective:  BP 133/89   Pulse 78   Temp 97.8 F (36.6 C) (Oral)   Ht 5\' 4"  (1.626 m)   Wt 240 lb 12.8 oz (109.2 kg)   SpO2 98%   BMI 41.33 kg/m   BP/Weight 04/19/2018 1/60/1093 05/02/5571  Systolic BP 220 254 270  Diastolic BP 89 70 82  Wt. (Lbs) 240.8 220 -  BMI 41.33 37.76 -  Some encounter information is confidential and restricted. Go to Review Flowsheets activity to see all data.      Physical Exam Constitutional:      Appearance: She is well-developed.  Cardiovascular:     Rate and Rhythm: Normal rate.     Heart sounds: Normal heart sounds. No murmur.  Pulmonary:     Effort: Pulmonary effort is normal.     Breath sounds: Normal breath sounds. No wheezing or rales.  Chest:     Chest wall: No tenderness.  Abdominal:     General: Bowel sounds are normal. There is no distension.     Palpations: Abdomen is soft. There is no mass.     Tenderness: There is no abdominal tenderness.  Musculoskeletal:     Comments: Limited ROM of R hip; L is normal Slight TTP of right lower back  Neurological:     Mental Status: She is alert and oriented to person, place, and time.  Psychiatric:        Mood and Affect: Mood normal.        Behavior: Behavior normal.     CMP Latest Ref Rng & Units 04/07/2018 10/14/2017 10/10/2017  Glucose 70 - 99 mg/dL 156(H) - 100(H)  BUN 6 - 20 mg/dL 8 - 8  Creatinine 0.44 -  1.00 mg/dL 0.68 - 0.78  Sodium 135 - 145 mmol/L 136 - 142  Potassium 3.5 - 5.1 mmol/L 3.4(L) 3.6 3.3(L)  Chloride 98 - 111 mmol/L 105 - 106  CO2 22 - 32 mmol/L 24 - 27  Calcium 8.9 - 10.3 mg/dL 8.9 - 9.3  Total Protein 6.5 - 8.1 g/dL - - 7.2  Total Bilirubin 0.3 - 1.2 mg/dL - - 0.3  Alkaline Phos 38 - 126 U/L - - 57  AST 15 - 41 U/L - - 16  ALT 0 - 44 U/L - - 18    Lipid Panel     Component Value  Date/Time   CHOL 177 10/14/2017 0619   CHOL 193 08/06/2016 0840   TRIG 106 10/14/2017 0619   HDL 45 10/14/2017 0619   HDL 44 08/06/2016 0840   CHOLHDL 3.9 10/14/2017 0619   VLDL 21 10/14/2017 0619   LDLCALC 111 (H) 10/14/2017 0619   LDLCALC 132 (H) 08/06/2016 0840     Assessment & Plan:   1. Essential hypertension Controlled Counseled on blood pressure goal of less than 130/80, low-sodium, DASH diet, medication compliance, 150 minutes of moderate intensity exercise per week. Discussed medication compliance, adverse effects. - amLODipine (NORVASC) 10 MG tablet; Take 1 tablet (10 mg total) by mouth daily.  Dispense: 30 tablet; Refill: 3  2. Mild intermittent asthma without complication No recent exacerbation - albuterol (PROVENTIL HFA;VENTOLIN HFA) 108 (90 Base) MCG/ACT inhaler; Inhale 2 puffs into the lungs every 6 (six) hours as needed for wheezing or shortness of breath (wheezing).  Dispense: 1 Inhaler; Refill: 6 - mometasone-formoterol (DULERA) 200-5 MCG/ACT AERO; Inhale 2 puffs into the lungs 2 (two) times daily.  Dispense: 1 Inhaler; Refill: 3 - montelukast (SINGULAIR) 10 MG tablet; Take 1 tablet (10 mg total) by mouth at bedtime.  Dispense: 30 tablet; Refill: 3  3. Pure hypercholesterolemia Controlled - atorvastatin (LIPITOR) 20 MG tablet; Take 1 tablet (20 mg total) by mouth daily.  Dispense: 30 tablet; Refill: 3  4. Lumbar radiculopathy Uncontrolled Drug screen at next visit and will sign pain contract then - acetaminophen-codeine (TYLENOL #3) 300-30 MG tablet; Take 1  tablet by mouth every 12 (twelve) hours as needed for moderate pain. Dx: chronic left hip pain  Dispense: 60 tablet; Refill: 2 - gabapentin (NEURONTIN) 300 MG capsule; Take 1 capsule (300 mg total) by mouth 2 (two) times daily.  Dispense: 60 capsule; Refill: 3  5. Other migraine without status migrainosus, not intractable Uncontrolled Stress has been a major trigger Increase dose of Topamax - topiramate (TOPAMAX) 50 MG tablet; Take 2 tablets (100 mg total) by mouth 2 (two) times daily.  Dispense: 60 tablet; Refill: 3  6. 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 Major depressive disorder, recurrent severe without psychotic features ([place]) Uncontrolled due to underlying stressors Followed by Psych  8. Need for immunization against influenza - Flu Vaccine QUAD 36+ mos IM   Meds ordered this encounter  Medications  . amLODipine (NORVASC) 10 MG tablet    Sig: Take 1 tablet (10 mg total) by mouth daily.    Dispense:  30 tablet    Refill:  3  . acetaminophen-codeine (TYLENOL #3) 300-30 MG tablet    Sig: Take 1 tablet by mouth every 12 (twelve) hours as needed for moderate pain. Dx: chronic left hip pain    Dispense:  60 tablet    Refill:  2  . albuterol (PROVENTIL HFA;VENTOLIN HFA) 108 (90 Base) MCG/ACT inhaler    Sig: Inhale 2 puffs into the lungs every 6 (six) hours as needed for wheezing or shortness of breath (wheezing).    Dispense:  1 Inhaler    Refill:  6  . atorvastatin (LIPITOR) 20 MG tablet    Sig: Take 1 tablet (20 mg total) by mouth daily.    Dispense:  30 tablet    Refill:  3  . cyclobenzaprine (FLEXERIL) 10 MG tablet    Sig: Take 1 tablet (10 mg total) by mouth 2 (two) times daily as needed for muscle spasms.    Dispense:  60 tablet    Refill:  3  . gabapentin (NEURONTIN) 300 MG capsule    Sig: Take 1 capsule (300 mg total) by mouth 2 (two) times daily.    Dispense:  60 capsule    Refill:  3  .  mometasone-formoterol (DULERA) 200-5 MCG/ACT AERO    Sig: Inhale 2 puffs into the lungs 2 (two) times daily.    Dispense:  1 Inhaler    Refill:  3  . montelukast (SINGULAIR) 10 MG tablet    Sig: Take 1 tablet (10 mg total) by mouth at bedtime.    Dispense:  30 tablet    Refill:  3  . topiramate (TOPAMAX) 50 MG tablet    Sig: Take 2 tablets (100 mg total) by mouth 2 (two) times daily.    Dispense:  60 tablet    Refill:  3    Dose increase    Follow-up: Return in about 3 months (around 07/19/2018) for For follow-up of chronic medical conditions.   Charlott Rakes MD

## 2018-05-15 ENCOUNTER — Telehealth: Payer: Self-pay | Admitting: Family Medicine

## 2018-05-15 MED ORDER — SUMATRIPTAN SUCCINATE 100 MG PO TABS: ORAL_TABLET | ORAL | 1 refills | Status: DC

## 2018-05-15 MED ORDER — SUMATRIPTAN SUCCINATE 100 MG PO TABS: 100.0000 mg | ORAL_TABLET | Freq: Once | ORAL | 1 refills | Status: DC

## 2018-05-15 NOTE — Telephone Encounter (Signed)
Script was faxed over to pharmacy on file due to patient not being able to pick up script from office.

## 2018-05-15 NOTE — Telephone Encounter (Signed)
Pt called in stating she is having severe headaches and she also states the medication she is currently taking isn't helping would like to be prescribed something different  Did advice pt to go to urgent care to be evaluated sooner

## 2018-05-15 NOTE — Telephone Encounter (Signed)
Prescription for Imitrex has been sent into her pharmacy on file

## 2018-05-17 ENCOUNTER — Ambulatory Visit: Payer: Medicaid Other | Admitting: Pharmacy Technician

## 2018-05-17 DIAGNOSIS — Z79899 Other long term (current) drug therapy: Secondary | ICD-10-CM

## 2018-05-17 NOTE — Progress Notes (Signed)
Completed [facility] application and contract.  Patient agreed to all terms of the [facility] contract.    Patient to provide last 30 days of pay stubs from spouse, checking account statement and 2019 tax return.  Provided patient with community resource material based on her particular needs.    Referring patient to [facility].  [place] [facility]

## 2018-07-19 ENCOUNTER — Ambulatory Visit: Payer: Self-pay | Admitting: Family Medicine

## 2018-07-25 ENCOUNTER — Ambulatory Visit: Payer: Self-pay | Attending: Family Medicine | Admitting: Family Medicine

## 2018-07-25 ENCOUNTER — Encounter: Payer: Self-pay | Admitting: Family Medicine

## 2018-07-25 ENCOUNTER — Other Ambulatory Visit: Payer: Self-pay

## 2018-07-25 DIAGNOSIS — G43809 Other migraine, not intractable, without status migrainosus: Secondary | ICD-10-CM

## 2018-07-25 DIAGNOSIS — M5416 Radiculopathy, lumbar region: Secondary | ICD-10-CM

## 2018-07-25 DIAGNOSIS — J4520 Mild intermittent asthma, uncomplicated: Secondary | ICD-10-CM

## 2018-07-25 DIAGNOSIS — E7800 Pure hypercholesterolemia, unspecified: Secondary | ICD-10-CM

## 2018-07-25 DIAGNOSIS — I10 Essential (primary) hypertension: Secondary | ICD-10-CM

## 2018-07-25 MED ORDER — TOPIRAMATE 50 MG PO TABS: 100.0000 mg | ORAL_TABLET | Freq: Two times a day (BID) | ORAL | 3 refills | Status: DC

## 2018-07-25 MED ORDER — SUMATRIPTAN SUCCINATE 100 MG PO TABS: ORAL_TABLET | ORAL | 1 refills | Status: DC

## 2018-07-25 MED ORDER — ATORVASTATIN CALCIUM 20 MG PO TABS: 20.0000 mg | ORAL_TABLET | Freq: Every day | ORAL | 3 refills | Status: DC

## 2018-07-25 MED ORDER — ACETAMINOPHEN-CODEINE #3 300-30 MG PO TABS: 1.0000 | ORAL_TABLET | Freq: Three times a day (TID) | ORAL | 2 refills | Status: DC | PRN

## 2018-07-25 MED ORDER — LIDOCAINE 5 % EX PTCH: 1.0000 | MEDICATED_PATCH | CUTANEOUS | 0 refills | Status: DC

## 2018-07-25 MED ORDER — ALBUTEROL SULFATE HFA 108 (90 BASE) MCG/ACT IN AERS: 2.0000 | INHALATION_SPRAY | Freq: Four times a day (QID) | RESPIRATORY_TRACT | 6 refills | Status: DC | PRN

## 2018-07-25 MED ORDER — CYCLOBENZAPRINE HCL 10 MG PO TABS: 10.0000 mg | ORAL_TABLET | Freq: Two times a day (BID) | ORAL | 3 refills | Status: DC | PRN

## 2018-07-25 MED ORDER — GABAPENTIN 300 MG PO CAPS: 600.0000 mg | ORAL_CAPSULE | Freq: Two times a day (BID) | ORAL | 3 refills | Status: DC

## 2018-07-25 MED ORDER — AMLODIPINE BESYLATE 10 MG PO TABS: 10.0000 mg | ORAL_TABLET | Freq: Every day | ORAL | 3 refills | Status: DC

## 2018-07-25 MED ORDER — MOMETASONE FURO-FORMOTEROL FUM 200-5 MCG/ACT IN AERO: 2.0000 | INHALATION_SPRAY | Freq: Two times a day (BID) | RESPIRATORY_TRACT | 3 refills | Status: DC

## 2018-07-25 NOTE — Progress Notes (Signed)
Virtual Visit via Telephone Note  I connected with Kelly Byrd, on 07/25/2018 at 1:36 PM by telephone and verified that I am speaking with the correct person using two identifiers.   Consent: I discussed the limitations, risks, security and privacy concerns of performing an evaluation and management service by telephone and the availability of in person appointments. I also discussed with the patient that there may be a patient responsible charge related to this service. The patient expressed understanding and agreed to proceed.   Location of Patient: Home  Location of Provider: Clinic   Persons participating in Telemedicine visit: Kelly Byrd-CMA Dr. Felecia Shelling     History of Present Illness: Kelly Byrd is a 43 year old female with a history of hypertension, asthma, migraine, anemia (secondary to menorrhagia from fibroids), chronic low back pain from lumbar degenerative disc disease, slipped capital femoral epiphysis seen for follow-up visit.  At her last visit I had commenced Tylenol 3 which she states has been minimally effective.  Due to lack of medical coverage she has been unable to see orthopedics or pain management for her chronic low back pain and pain in her hips.  Pain is so severe and she is sometimes unable to get out of bed and her husband has to help her which has resulted in his missing work and being let go from his place of work. Her migraines have improved since Topamax was increased at her last office visit. Currently sees psychiatry who manages anxiety and depression and is currently assisting with her application for disability which she was denied in the past.  Zoloft was increased to 200 mg during her visit today with him. Asthma has been stable with no recent flares.  She continues to smoke 4 cigarettes a day and is working on quitting.  Past Medical History:  Diagnosis Date  . Asthma   . Back pain   . GERD (gastroesophageal  reflux disease)   . Hypertension   . Kidney stone   . Migraines   . Sickle cell trait (HCC)    Allergies  Allergen Reactions  . Ace Inhibitors Swelling  . Lisinopril     Swelling--caused the pt to stay in the hospital  . Peanuts [Peanut Oil]     Swelling--caused the pt to stay in the hospital    Current Outpatient Medications on File Prior to Visit  Medication Sig Dispense Refill  . acetaminophen-codeine (TYLENOL #3) 300-30 MG tablet Take 1 tablet by mouth every 12 (twelve) hours as needed for moderate pain. Dx: chronic left hip pain 60 tablet 2  . albuterol (PROVENTIL HFA;VENTOLIN HFA) 108 (90 Base) MCG/ACT inhaler Inhale 2 puffs into the lungs every 6 (six) hours as needed for wheezing or shortness of breath (wheezing). 1 Inhaler 6  . amLODipine (NORVASC) 10 MG tablet Take 1 tablet (10 mg total) by mouth daily. 30 tablet 3  . atorvastatin (LIPITOR) 20 MG tablet Take 1 tablet (20 mg total) by mouth daily. 30 tablet 3  . cyclobenzaprine (FLEXERIL) 10 MG tablet Take 1 tablet (10 mg total) by mouth 2 (two) times daily as needed for muscle spasms. 60 tablet 3  . gabapentin (NEURONTIN) 300 MG capsule Take 1 capsule (300 mg total) by mouth 2 (two) times daily. 60 capsule 3  . hydrOXYzine (ATARAX/VISTARIL) 50 MG tablet Take 1 tablet (50 mg total) by mouth every 6 (six) hours as needed for anxiety. 30 tablet 0  . mometasone-formoterol (DULERA) 200-5 MCG/ACT AERO Inhale 2 puffs into the  lungs 2 (two) times daily. 1 Inhaler 3  . montelukast (SINGULAIR) 10 MG tablet Take 1 tablet (10 mg total) by mouth at bedtime. 30 tablet 3  . QUEtiapine (SEROQUEL) 50 MG tablet Take 1 tablet (50 mg total) by mouth at bedtime. 30 tablet 0  . sertraline (ZOLOFT) 50 MG tablet Take 1 tablet (50 mg total) by mouth daily. 30 tablet 0  . SUMAtriptan (IMITREX) 100 MG tablet 100 mg orally at the onset of a migraine, may repeat in 2 hours if headache persists or recurs. Max 200mg  in 24 hrs 10 tablet 1  . topiramate  (TOPAMAX) 50 MG tablet Take 2 tablets (100 mg total) by mouth 2 (two) times daily. 60 tablet 3  . traZODone (DESYREL) 100 MG tablet Take 1 tablet (100 mg total) by mouth at bedtime. 30 tablet 0   No current facility-administered medications on file prior to visit.     Observations/Objective: Awake, alert, oriented x3 She seems to be in pain from our conversation  Assessment and Plan: 1. Lumbar radiculopathy Uncontrolled Increase gabapentin dose and Tylenol 3 Plan was to sign a controlled substance agreement today however this is a telemedicine visit we will address this at a subsequent imprison visit - gabapentin (NEURONTIN) 300 MG capsule; Take 2 capsules (600 mg total) by mouth 2 (two) times daily.  Dispense: 120 capsule; Refill: 3 - acetaminophen-codeine (TYLENOL #3) 300-30 MG tablet; Take 1 tablet by mouth every 8 (eight) hours as needed for moderate pain. Dx: chronic left hip pain  Dispense: 90 tablet; Refill: 2  2. Essential hypertension Stable Continue antihypertensives Counseled on blood pressure goal of less than 130/80, low-sodium, DASH diet, medication compliance, 150 minutes of moderate intensity exercise per week. Discussed medication compliance, adverse effects. - amLODipine (NORVASC) 10 MG tablet; Take 1 tablet (10 mg total) by mouth daily.  Dispense: 30 tablet; Refill: 3  3. Other migraine without status migrainosus, not intractable Improved - topiramate (TOPAMAX) 50 MG tablet; Take 2 tablets (100 mg total) by mouth 2 (two) times daily.  Dispense: 60 tablet; Refill: 3  4. Mild intermittent asthma without complication Stable Advised on smoking cessation - mometasone-formoterol (DULERA) 200-5 MCG/ACT AERO; Inhale 2 puffs into the lungs 2 (two) times daily.  Dispense: 1 Inhaler; Refill: 3 - albuterol (VENTOLIN HFA) 108 (90 Base) MCG/ACT inhaler; Inhale 2 puffs into the lungs every 6 (six) hours as needed for wheezing or shortness of breath (wheezing).  Dispense: 1  Inhaler; Refill: 6  5. Pure hypercholesterolemia Stable Low-cholesterol diet - atorvastatin (LIPITOR) 20 MG tablet; Take 1 tablet (20 mg total) by mouth daily.  Dispense: 30 tablet; Refill: 3   Follow Up Instructions: Return in about 3 months (around 10/24/2018).    I discussed the assessment and treatment plan with the patient. The patient was provided an opportunity to ask questions and all were answered. The patient agreed with the plan and demonstrated an understanding of the instructions.   The patient was advised to call back or seek an in-person evaluation if the symptoms worsen or if the condition fails to improve as anticipated.     I provided 26 minutes total of non-face-to-face time during this encounter including median intraservice time, reviewing previous notes, labs, imaging, medications and explaining diagnosis and management.     Charlott Rakes, MD, FAAFP. [facility] and [place], [place]   07/25/2018, 1:36 PM

## 2018-07-25 NOTE — Progress Notes (Signed)
Patient has been called and DOB has been verified. Patient has been screened and transferred to PCP to start phone visit.  C/C:pain in legs, back, hips.

## 2018-08-11 ENCOUNTER — Telehealth: Payer: Self-pay | Admitting: Pharmacy Technician

## 2018-08-11 NOTE — Telephone Encounter (Signed)
Patient failed to provide proof of income documentation.  No additional medication assistance will be provided by [facility] without the required proof of income documentation.  Patient notified by letter.  [place] [facility]

## 2018-11-24 ENCOUNTER — Other Ambulatory Visit: Payer: Self-pay

## 2018-11-24 ENCOUNTER — Other Ambulatory Visit: Payer: Self-pay | Admitting: Family Medicine

## 2018-11-24 ENCOUNTER — Ambulatory Visit: Payer: Medicaid Other | Admitting: Pharmacy Technician

## 2018-11-24 DIAGNOSIS — Z79899 Other long term (current) drug therapy: Secondary | ICD-10-CM

## 2018-11-24 NOTE — Progress Notes (Signed)
Completed [facility] application and contract.  Patient agreed to all terms of the [facility] contract.    Patient and husband are unemployed with no income.  They are about to be evicted from their home.  Patient stated that [place] has agreed to pay for 2 months of their rent to prevent eviction.  Sherlean Foot from [employer] [place] has approved them for a $700 utility credit with [employer].  Patient has a hearing on 01/16/19 to determine her eligibility for Social Security Disability.  Patient is to see me again on 11/18 to discuss her financial status and eligibility.  Provided patient with community resource material based on her particular needs.    Referred patient to [facility]  Patient declined referral.  Patient stated that she is receiving care from a provider at [facility] and Dr. Holley Raring.  Does not want to change providers.  [place] [facility]

## 2018-12-18 ENCOUNTER — Emergency Department
Admission: EM | Admit: 2018-12-18 | Discharge: 2018-12-18 | Disposition: A | Discharge status: Home / Self Care | Payer: Medicaid Other | Attending: Emergency Medicine | Admitting: Emergency Medicine

## 2018-12-18 ENCOUNTER — Encounter: Payer: Self-pay | Admitting: Emergency Medicine

## 2018-12-18 ENCOUNTER — Emergency Department: Payer: Medicaid Other

## 2018-12-18 ENCOUNTER — Other Ambulatory Visit: Payer: Self-pay

## 2018-12-18 DIAGNOSIS — N390 Urinary tract infection, site not specified: Secondary | ICD-10-CM | POA: Diagnosis not present

## 2018-12-18 DIAGNOSIS — R197 Diarrhea, unspecified: Secondary | ICD-10-CM | POA: Insufficient documentation

## 2018-12-18 DIAGNOSIS — Z79899 Other long term (current) drug therapy: Secondary | ICD-10-CM | POA: Diagnosis not present

## 2018-12-18 DIAGNOSIS — R1110 Vomiting, unspecified: Secondary | ICD-10-CM | POA: Insufficient documentation

## 2018-12-18 DIAGNOSIS — R109 Unspecified abdominal pain: Secondary | ICD-10-CM | POA: Diagnosis present

## 2018-12-18 DIAGNOSIS — J45909 Unspecified asthma, uncomplicated: Secondary | ICD-10-CM | POA: Insufficient documentation

## 2018-12-18 DIAGNOSIS — I10 Essential (primary) hypertension: Secondary | ICD-10-CM | POA: Insufficient documentation

## 2018-12-18 DIAGNOSIS — Z20828 Contact with and (suspected) exposure to other viral communicable diseases: Secondary | ICD-10-CM | POA: Insufficient documentation

## 2018-12-18 DIAGNOSIS — F17210 Nicotine dependence, cigarettes, uncomplicated: Secondary | ICD-10-CM | POA: Diagnosis not present

## 2018-12-18 LAB — URINALYSIS, COMPLETE (UACMP) WITH MICROSCOPIC
Bacteria, UA: NONE SEEN
Bilirubin Urine: NEGATIVE
Glucose, UA: NEGATIVE mg/dL
Ketones, ur: NEGATIVE mg/dL
Nitrite: POSITIVE — AB
Protein, ur: 100 mg/dL — AB
Specific Gravity, Urine: 1.014 (ref 1.005–1.030)
pH: 7 (ref 5.0–8.0)

## 2018-12-18 LAB — COMPREHENSIVE METABOLIC PANEL
ALT: 20 U/L (ref 0–44)
AST: 18 U/L (ref 15–41)
Albumin: 4 g/dL (ref 3.5–5.0)
Alkaline Phosphatase: 65 U/L (ref 38–126)
Anion gap: 10 (ref 5–15)
BUN: 5 mg/dL — ABNORMAL LOW (ref 6–20)
CO2: 25 mmol/L (ref 22–32)
Calcium: 8.7 mg/dL — ABNORMAL LOW (ref 8.9–10.3)
Chloride: 105 mmol/L (ref 98–111)
Creatinine, Ser: 0.52 mg/dL (ref 0.44–1.00)
GFR calc Af Amer: >60 mL/min (ref >60)
GFR calc non Af Amer: >60 mL/min (ref >60)
Glucose, Bld: 118 mg/dL — ABNORMAL HIGH (ref 70–99)
Potassium: 3.2 mmol/L — ABNORMAL LOW (ref 3.5–5.1)
Sodium: 140 mmol/L (ref 135–145)
Total Bilirubin: 0.6 mg/dL (ref 0.3–1.2)
Total Protein: 7.5 g/dL (ref 6.5–8.1)

## 2018-12-18 LAB — CBC
HCT: 37.3 % (ref 36.0–46.0)
Hemoglobin: 11.4 g/dL — ABNORMAL LOW (ref 12.0–15.0)
MCH: 20.2 pg — ABNORMAL LOW (ref 26.0–34.0)
MCHC: 30.6 g/dL (ref 30.0–36.0)
MCV: 66.3 fL — ABNORMAL LOW (ref 80.0–100.0)
Platelets: 488 10*3/uL — ABNORMAL HIGH (ref 150–400)
RBC: 5.63 MIL/uL — ABNORMAL HIGH (ref 3.87–5.11)
RDW: 20.1 % — ABNORMAL HIGH (ref 11.5–15.5)
WBC: 6.8 10*3/uL (ref 4.0–10.5)
nRBC: 0 % (ref 0.0–0.2)

## 2018-12-18 LAB — POCT PREGNANCY, URINE: Preg Test, Ur: NEGATIVE

## 2018-12-18 LAB — LIPASE, BLOOD: Lipase: 41 U/L (ref 11–51)

## 2018-12-18 MED ORDER — MORPHINE SULFATE (PF) 4 MG/ML IV SOLN
4.0000 mg | Freq: Once | INTRAVENOUS | Status: AC
  Administered 2018-12-18: 4 mg via INTRAVENOUS
  Filled 2018-12-18: qty 1

## 2018-12-18 MED ORDER — MELOXICAM 7.5 MG PO TABS: 7.5000 mg | ORAL_TABLET | Freq: Every day | ORAL | 0 refills | Status: DC

## 2018-12-18 MED ORDER — ONDANSETRON 4 MG PO TBDP: 4.0000 mg | ORAL_TABLET | Freq: Three times a day (TID) | ORAL | 0 refills | Status: DC | PRN

## 2018-12-18 MED ORDER — CEPHALEXIN 500 MG PO CAPS: 500.0000 mg | ORAL_CAPSULE | Freq: Three times a day (TID) | ORAL | 0 refills | Status: DC

## 2018-12-18 MED ORDER — HYDROCODONE-ACETAMINOPHEN 5-325 MG PO TABS: 1.0000 | ORAL_TABLET | ORAL | 0 refills | Status: DC | PRN

## 2018-12-18 MED ORDER — ONDANSETRON HCL 4 MG/2ML IJ SOLN
4.0000 mg | Freq: Once | INTRAMUSCULAR | Status: AC
  Administered 2018-12-18: 4 mg via INTRAVENOUS
  Filled 2018-12-18: qty 2

## 2018-12-18 MED ORDER — SODIUM CHLORIDE 0.9 % IV BOLUS
1000.0000 mL | Freq: Once | INTRAVENOUS | Status: AC
  Administered 2018-12-18: 1000 mL via INTRAVENOUS

## 2018-12-18 MED ORDER — SODIUM CHLORIDE 0.9% FLUSH: 3.0000 mL | Freq: Once | INTRAVENOUS | Status: DC

## 2018-12-18 MED ORDER — SODIUM CHLORIDE 0.9 % IV SOLN
1.0000 g | Freq: Once | INTRAVENOUS | Status: AC
  Administered 2018-12-18: 17:00:00 1 g via INTRAVENOUS
  Filled 2018-12-18 (×2): qty 10

## 2018-12-18 NOTE — ED Triage Notes (Signed)
Pt presents to ED via POV with c/o abdominal, generalized body aches, vomiting and diarrhea. Pt states decreased PO intake due to vomiting and diarrhea. Pt denies blood in vomit or diarrhea. Pt a-febrile in triage. Pt states lower abdominal pain at this time. Pt also c/o chills and sweating while at home.

## 2018-12-18 NOTE — ED Provider Notes (Signed)
[facility] Emergency Department Provider Note  Time seen: 1:20 PM  I have reviewed the triage vital signs and the nursing notes.   HISTORY  Chief Complaint Abdominal Pain, Emesis, and Diarrhea   HPI Kelly Byrd is a 43 y.o. female with a past medical history of asthma, gastric reflux, hypertension, kidney stones, presents to the emergency department for abdominal pain nausea vomiting.  According to the patient for the past 2 days she has been nauseated frequent episodes of vomiting is experiencing aching pain mostly in the left side of her abdomen.  Patient also states she has had a slight cough as well as subjective fever and body aches over the past 2 days.  Denies any shortness of breath.  Denies any diarrhea.  Last menstrual period ended 1 week ago.   Past Medical History:  Diagnosis Date  . Asthma   . Back pain   . GERD (gastroesophageal reflux disease)   . Hypertension   . Kidney stone   . Migraines   . Sickle cell trait [facility])     Patient Active Problem List   Diagnosis Date Noted  . Major depressive disorder, recurrent severe without psychotic features ([place]) 10/11/2017  . Cocaine abuse ([place]) 10/11/2017  . Lumbar radiculopathy 05/31/2017  . Hyperlipidemia 05/31/2017  . GERD (gastroesophageal reflux disease) 08/05/2016  . Fibroids 09/19/2015  . Hypertension 08/29/2015  . Asthma 08/29/2015  . Back pain 08/29/2015  . 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 Anemia, iron deficiency 10/29/2014  . Menometrorrhagia 10/29/2014  . Migraine 10/27/2014    Past Surgical History:  Procedure Laterality Date  . CESAREAN SECTION     2    Prior to Admission medications   Medication Sig Start Date End Date Taking? Authorizing Provider  acetaminophen-codeine (TYLENOL #3) 300-30 MG tablet Take 1 tablet by mouth every 8 (eight) hours as needed for moderate pain. Dx: chronic left hip pain 07/25/18   Charlott Rakes, MD  albuterol (VENTOLIN HFA)  108 (90 Base) MCG/ACT inhaler Inhale 2 puffs into the lungs every 6 (six) hours as needed for wheezing or shortness of breath (wheezing). 07/25/18   Charlott Rakes, MD  amLODipine (NORVASC) 10 MG tablet Take 1 tablet (10 mg total) by mouth daily. 07/25/18   Charlott Rakes, MD  atorvastatin (LIPITOR) 20 MG tablet Take 1 tablet (20 mg total) by mouth daily. 07/25/18   Charlott Rakes, MD  cyclobenzaprine (FLEXERIL) 10 MG tablet Take 1 tablet (10 mg total) by mouth 2 (two) times daily as needed for muscle spasms. 07/25/18   Charlott Rakes, MD  gabapentin (NEURONTIN) 300 MG capsule Take 2 capsules (600 mg total) by mouth 2 (two) times daily. 07/25/18   Charlott Rakes, MD  hydrOXYzine (ATARAX/VISTARIL) 50 MG tablet Take 1 tablet (50 mg total) by mouth every 6 (six) hours as needed for anxiety. 10/14/17   Starkes-Perry, Gayland Curry, FNP  lidocaine (LIDODERM) 5 % Place 1 patch onto the skin daily. Remove & Discard patch within 12 hours or as directed by MD 07/25/18   Charlott Rakes, MD  mometasone-formoterol (DULERA) 200-5 MCG/ACT AERO Inhale 2 puffs into the lungs 2 (two) times daily. 07/25/18   Charlott Rakes, MD  montelukast (SINGULAIR) 10 MG tablet Take 1 tablet (10 mg total) by mouth at bedtime. 04/19/18   Charlott Rakes, MD  QUEtiapine (SEROQUEL) 50 MG tablet Take 1 tablet (50 mg total) by mouth at bedtime. 10/14/17   Suella Broad, FNP  sertraline (ZOLOFT) 50 MG tablet Take 1  tablet (50 mg total) by mouth daily. 10/15/17   Starkes-Perry, Gayland Curry, FNP  SUMAtriptan (IMITREX) 100 MG tablet 100 mg orally at the onset of a migraine, may repeat in 2 hours if headache persists or recurs. Max 200mg  in 24 hrs 07/25/18   Charlott Rakes, MD  topiramate (TOPAMAX) 50 MG tablet Take 2 tablets (100 mg total) by mouth 2 (two) times daily. 07/25/18   Charlott Rakes, MD  traZODone (DESYREL) 100 MG tablet Take 1 tablet (100 mg total) by mouth at bedtime. 10/14/17   Suella Broad, FNP    Allergies  Allergen  Reactions  . Ace Inhibitors Swelling  . Lisinopril     Swelling--caused the pt to stay in the hospital  . Peanuts [Peanut Oil]     Swelling--caused the pt to stay in the hospital    Family History  Problem Relation Age of Onset  . Hypertension Mother   . Hypertension Father   . Diabetes Mellitus II Maternal Grandmother   . Lupus Other     Social History Social History   Tobacco Use  . Smoking status: Current Every Day Smoker    Packs/day: 0.25    Types: Cigarettes  . Smokeless tobacco: Never Used  Substance Use Topics  . Alcohol use: Yes    Alcohol/week: 1.0 standard drinks    Types: 1 Cans of beer per week    Comment: 2 x 40oz bud ice  . Drug use: No    Review of Systems Constitutional: Subjective fever at home. Cardiovascular: Negative for chest pain. Respiratory: Negative for shortness of breath. Gastrointestinal: Left flank pain.  Positive nausea vomiting. Genitourinary: States mild hematuria today. Musculoskeletal: Body aches. Neurological: Negative for headache All other ROS negative  ____________________________________________   PHYSICAL EXAM:  VITAL SIGNS: ED Triage Vitals [12/18/18 1154]  Enc Vitals Group     BP (!) 147/104     Pulse Rate 89     Resp (!) 24     Temp 98.6 F (37 C)     Temp Source Oral     SpO2 99 %     Weight 218 lb (98.9 kg)     Height 5\' 4"  (1.626 m)     Head Circumference      Peak Flow      Pain Score 10     Pain Loc      Pain Edu?      Excl. in [place]?    Constitutional: Alert and oriented. Well appearing and in no distress. Eyes: Normal exam ENT      Head: Normocephalic and atraumatic      Mouth/Throat: Mucous membranes are moist. Cardiovascular: Normal rate, regular rhythm.  Respiratory: Normal respiratory effort without tachypnea nor retractions. Breath sounds are clear  Gastrointestinal: Soft and nontender. No distention.  Mild left CVA tenderness to palpation. Musculoskeletal: Nontender with normal range of  motion in all extremities.  Neurologic:  Normal speech and language. No gross focal neurologic deficits  Skin:  Skin is warm, dry and intact.  Psychiatric: Mood and affect are normal.  ____________________________________________   RADIOLOGY  CT shows no significant findings.  ____________________________________________   INITIAL IMPRESSION / ASSESSMENT AND PLAN / ED COURSE  Pertinent labs & imaging results that were available during my care of the patient were reviewed by me and considered in my medical decision making (see chart for details).   Patient presents emergency department for left flank pain nausea vomiting as well as body aches subjective fever and  slight cough.  Overall the patient appears extremely well, no distress.  Reassuring vitals.  Lab work is reassuring thus far, urine pending.  Differential would include pregnancy, ureterolithiasis, pyelonephritis or UTI, colitis or diverticulitis, gastroenteritis.  We will dose fluids, nausea and pain medication we will obtain a CT renal scan to rule out ureterolithiasis.  Patient is also requesting a coronavirus swab we will perform a send out test for the patient.  CT is negative for significant finding.  Urinalysis consistent with significant urinary tract infection.  We will dose Rocephin and discharged with Keflex.  Urine culture has been added onto the patient's work-up.  Kaila Binkerd was evaluated in Emergency Department on 12/18/2018 for the symptoms described in the history of present illness. She was evaluated in the context of the global COVID-19 pandemic, which necessitated consideration that the patient might be at risk for infection with the SARS-CoV-2 virus that causes COVID-19. Institutional protocols and algorithms that pertain to the evaluation of patients at risk for COVID-19 are in a state of rapid change based on information released by regulatory bodies including the CDC and federal and state organizations. These  policies and algorithms were followed during the patient's care in the ED.  ____________________________________________   FINAL CLINICAL IMPRESSION(S) / ED DIAGNOSES  Left flank pain Nausea vomiting Urinary tract infection.   Harvest Dark, MD 12/18/18 [phone]

## 2018-12-19 LAB — SARS CORONAVIRUS 2 (TAT 6-24 HRS): SARS Coronavirus 2: NEGATIVE

## 2018-12-20 LAB — URINE CULTURE: Culture: >=100000 — AB

## 2019-01-08 ENCOUNTER — Ambulatory Visit: Payer: Medicaid Other

## 2019-01-08 ENCOUNTER — Other Ambulatory Visit: Payer: Self-pay

## 2019-01-08 DIAGNOSIS — Z79899 Other long term (current) drug therapy: Secondary | ICD-10-CM

## 2019-01-08 NOTE — Progress Notes (Signed)
[facility] Visit Note  Patient: Kelly Byrd MRN: [id number] Date of Birth: 1976/03/17 PCP: Charlott Rakes, MD   Hosie Poisson 43 y.o. female telephoned for a remote medication management visit today.  Patient Information   Past Medical History:  Diagnosis Date  . Asthma   . Back pain   . GERD (gastroesophageal reflux disease)   . Hypertension   . Kidney stone   . Migraines   . Sickle cell trait [facility])       Past Surgical History:  Procedure Laterality Date  . CESAREAN SECTION     2     Family History  Problem Relation Age of Onset  . Hypertension Mother   . Hypertension Father   . Diabetes Mellitus II Maternal Grandmother   . Lupus Other     New Diagnoses (since last visit): None  Family Support: Good  Lifestyle Diet: Breakfast: Eggs, bacon, toast Lunch: Sandwich Dinner: Ribs, mac& cheese, string beans Drinks: Water    Current Exercise Habits: The patient does not participate in regular exercise at present  Exercise limited by: orthopedic condition(s)    Social History   Substance and Sexual Activity  Alcohol Use Yes  . Alcohol/week: 1.0 standard drinks  . Types: 1 Cans of beer per week   Comment: 2 x 40oz bud ice      Social History   Tobacco Use  Smoking Status Current Every Day Smoker  . Packs/day: 0.25  . Types: Cigarettes  Smokeless Tobacco Never Used      Health Maintenance  Topic Date Due  . HIV Screening  01/16/1992  . INFLUENZA VACCINE  10/28/2018  . PAP SMEAR-Modifier  11/26/2018  . TETANUS/TDAP  10/01/2027   Health Maintenance/Date Completed  Last ED visit: 12/18/18 (E. Coli UTI) Last Visit to PCP: 07/25/18 (telemedicine) Pelvic/PAP Exam: 10/2015 per EMR Mammogram: Never per EMR Colonoscopy: < 32 y/o Flu Vaccine: No- states she is scared to get because her immunity has been down  Outpatient Encounter Medications as of 01/08/2019  Medication Sig  . acetaminophen-codeine (TYLENOL #3) 300-30 MG  tablet Take 1 tablet by mouth every 8 (eight) hours as needed for moderate pain. Dx: chronic left hip pain  . albuterol (VENTOLIN HFA) 108 (90 Base) MCG/ACT inhaler Inhale 2 puffs into the lungs every 6 (six) hours as needed for wheezing or shortness of breath (wheezing).  Marland Kitchen amLODipine (NORVASC) 10 MG tablet Take 1 tablet (10 mg total) by mouth daily.  Marland Kitchen atorvastatin (LIPITOR) 20 MG tablet Take 1 tablet (20 mg total) by mouth daily.  . cyclobenzaprine (FLEXERIL) 10 MG tablet Take 1 tablet (10 mg total) by mouth 2 (two) times daily as needed for muscle spasms.  Marland Kitchen gabapentin (NEURONTIN) 300 MG capsule Take 2 capsules (600 mg total) by mouth 2 (two) times daily.  . hydrOXYzine (ATARAX/VISTARIL) 50 MG tablet Take 1 tablet (50 mg total) by mouth every 6 (six) hours as needed for anxiety.  . meloxicam (MOBIC) 7.5 MG tablet Take 1 tablet (7.5 mg total) by mouth daily.  . mometasone-formoterol (DULERA) 200-5 MCG/ACT AERO Inhale 2 puffs into the lungs 2 (two) times daily.  . montelukast (SINGULAIR) 10 MG tablet Take 1 tablet (10 mg total) by mouth at bedtime.  . ondansetron (ZOFRAN ODT) 4 MG disintegrating tablet Take 1 tablet (4 mg total) by mouth every 8 (eight) hours as needed for nausea or vomiting.  Marland Kitchen QUEtiapine (SEROQUEL) 50 MG tablet Take 1 tablet (50 mg total) by mouth at bedtime. (Patient taking  differently: Take 150 mg by mouth at bedtime. )  . sertraline (ZOLOFT) 50 MG tablet Take 1 tablet (50 mg total) by mouth daily.  . SUMAtriptan (IMITREX) 100 MG tablet 100 mg orally at the onset of a migraine, may repeat in 2 hours if headache persists or recurs. Max 288m in 24 hrs  . topiramate (TOPAMAX) 50 MG tablet Take 2 tablets (100 mg total) by mouth 2 (two) times daily.  . traZODone (DESYREL) 100 MG tablet Take 1 tablet (100 mg total) by mouth at bedtime.  . cephALEXin (KEFLEX) 500 MG capsule Take 1 capsule (500 mg total) by mouth 3 (three) times daily. (Patient not taking: Reported on 01/08/2019)  .  lidocaine (LIDODERM) 5 % Place 1 patch onto the skin daily. Remove & Discard patch within 12 hours or as directed by MD (Patient not taking: Reported on 01/08/2019)   No facility-administered encounter medications on file as of 01/08/2019.     Assessment and Plan:  Abdominal pain/BRBPR: Patient reports persistent abdominal pain/discomfort as well as recent bright red blood per rectum. She had a recent ED visit on 9/21 with a UTI dx and positive urine cx for >100k colonies E. Coli. She was discharged on cephalexin which strain of E. Coli was susceptible to. However, she states she did not complete the course of antibiotics because she noted bleeding from her rectum which she thought might be a result of the antibiotics. Advised patient to seek medical attention.   Depression: Patient endorses a history of depression for which she is on Seroquel, Zoloft, and trazodone. She states she has had difficulty finding medication regimen that works for her and has required switching agents and titration of medications.   Migraines: Endorses migraines on average 2x/week that can last up to 2-3 days. Worse with light and noise. Better in a dark, cool place, with something cool on her head (e.g. wet rag). She is on topiramate for prophylaxis and sumatriptan for abortive therapy. She has ondansetron which she uses as needed for associated nausea/vomiting.   Lumbar radiculopathy: Patient takes Tylenol #3, gabapentin, meloxicam and cyclobenzaprine. Endorses persistent pain which inhibits mobility.  Asthma: Patient has a history of asthma and her medication regimen includes Albuterol HFA, Dulera, and montelukast. She states that she [facility]. She endorses using her rescue inhaler approximately 2x/day.   Hypertension: Antihypertensive regimen includes amlodipine. She denies signs/symptoms of hypotension. Allergies include angioedema with lisinopril which patient reports resulted in swelling of  face, mouth, and throat.    Adherence: Patient reports overall good adherence with occasional forgetfulness.

## 2019-02-13 ENCOUNTER — Emergency Department
Admission: EM | Admit: 2019-02-13 | Discharge: 2019-02-13 | Disposition: A | Discharge status: Home / Self Care | Payer: Medicaid Other | Attending: Emergency Medicine | Admitting: Emergency Medicine

## 2019-02-13 ENCOUNTER — Emergency Department: Payer: Medicaid Other

## 2019-02-13 ENCOUNTER — Encounter: Payer: Self-pay | Admitting: Emergency Medicine

## 2019-02-13 ENCOUNTER — Other Ambulatory Visit: Payer: Self-pay

## 2019-02-13 DIAGNOSIS — J45909 Unspecified asthma, uncomplicated: Secondary | ICD-10-CM | POA: Insufficient documentation

## 2019-02-13 DIAGNOSIS — Z91010 Allergy to peanuts: Secondary | ICD-10-CM | POA: Diagnosis not present

## 2019-02-13 DIAGNOSIS — R519 Headache, unspecified: Secondary | ICD-10-CM | POA: Diagnosis present

## 2019-02-13 DIAGNOSIS — G43811 Other migraine, intractable, with status migrainosus: Secondary | ICD-10-CM | POA: Diagnosis not present

## 2019-02-13 DIAGNOSIS — Z79899 Other long term (current) drug therapy: Secondary | ICD-10-CM | POA: Insufficient documentation

## 2019-02-13 DIAGNOSIS — I10 Essential (primary) hypertension: Secondary | ICD-10-CM | POA: Insufficient documentation

## 2019-02-13 DIAGNOSIS — F17210 Nicotine dependence, cigarettes, uncomplicated: Secondary | ICD-10-CM | POA: Diagnosis not present

## 2019-02-13 MED ORDER — SUMATRIPTAN SUCCINATE 6 MG/0.5ML ~~LOC~~ SOLN
6.0000 mg | Freq: Once | SUBCUTANEOUS | Status: AC
  Administered 2019-02-13: 6 mg via SUBCUTANEOUS
  Filled 2019-02-13: qty 0.5

## 2019-02-13 MED ORDER — KETOROLAC TROMETHAMINE 30 MG/ML IJ SOLN
30.0000 mg | Freq: Once | INTRAMUSCULAR | Status: AC
  Administered 2019-02-13: 30 mg via INTRAVENOUS
  Filled 2019-02-13: qty 1

## 2019-02-13 MED ORDER — BUTALBITAL-APAP-CAFFEINE 50-325-40 MG PO TABS: 1.0000 | ORAL_TABLET | Freq: Four times a day (QID) | ORAL | 0 refills | Status: DC | PRN

## 2019-02-13 MED ORDER — ONDANSETRON HCL 4 MG/2ML IJ SOLN
4.0000 mg | Freq: Once | INTRAMUSCULAR | Status: AC
  Administered 2019-02-13: 10:00:00 4 mg via INTRAVENOUS
  Filled 2019-02-13: qty 2

## 2019-02-13 MED ORDER — METHYLPREDNISOLONE SODIUM SUCC 125 MG IJ SOLR
125.0000 mg | Freq: Once | INTRAMUSCULAR | Status: AC
  Administered 2019-02-13: 125 mg via INTRAMUSCULAR
  Filled 2019-02-13: qty 2

## 2019-02-13 MED ORDER — DIPHENHYDRAMINE HCL 50 MG/ML IJ SOLN
50.0000 mg | Freq: Once | INTRAMUSCULAR | Status: AC
  Administered 2019-02-13: 50 mg via INTRAVENOUS
  Filled 2019-02-13: qty 1

## 2019-02-13 MED ORDER — SODIUM CHLORIDE 0.9 % IV BOLUS
1000.0000 mL | Freq: Once | INTRAVENOUS | Status: AC
  Administered 2019-02-13: 10:00:00 1000 mL via INTRAVENOUS

## 2019-02-13 NOTE — ED Provider Notes (Signed)
[facility] ([place]) Emergency Department Provider Note  ____________________________________________  Time seen: Approximately 8:56 AM  I have reviewed the triage vital signs and the nursing notes.   HISTORY  Chief Complaint Headache    HPI Kelly Byrd is a 43 y.o. female that presents to the emergency department for evaluation of migraine since Friday. Migraine wraps all around her head. Migraine is increasing in severity over the weekend. She is nauseous. She is also having light and sound sensitivity. No trauma. Patient has a history of migraines but state this one is worse than her usual.Patient took topamax at 2am. No dizziness, visual changes.   Past Medical History:  Diagnosis Date  . Asthma   . Back pain   . GERD (gastroesophageal reflux disease)   . Hypertension   . Kidney stone   . Migraines   . Sickle cell trait [facility])     Patient Active Problem List   Diagnosis Date Noted  . Major depressive disorder, recurrent severe without psychotic features ([place]) 10/11/2017  . Cocaine abuse ([place]) 10/11/2017  . Lumbar radiculopathy 05/31/2017  . Hyperlipidemia 05/31/2017  . GERD (gastroesophageal reflux disease) 08/05/2016  . Fibroids 09/19/2015  . Hypertension 08/29/2015  . Asthma 08/29/2015  . Back pain 08/29/2015  . 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 Anemia, iron deficiency 10/29/2014  . Menometrorrhagia 10/29/2014  . Migraine 10/27/2014    Past Surgical History:  Procedure Laterality Date  . CESAREAN SECTION     2    Prior to Admission medications   Medication Sig Start Date End Date Taking? Authorizing Provider  acetaminophen-codeine (TYLENOL #3) 300-30 MG tablet Take 1 tablet by mouth every 8 (eight) hours as needed for moderate pain. Dx: chronic left hip pain 07/25/18   Charlott Rakes, MD  albuterol (VENTOLIN HFA) 108 (90 Base) MCG/ACT inhaler Inhale 2 puffs into the lungs every 6 (six) hours as needed for wheezing or  shortness of breath (wheezing). 07/25/18   Charlott Rakes, MD  amLODipine (NORVASC) 10 MG tablet Take 1 tablet (10 mg total) by mouth daily. 07/25/18   Charlott Rakes, MD  atorvastatin (LIPITOR) 20 MG tablet Take 1 tablet (20 mg total) by mouth daily. 07/25/18   Charlott Rakes, MD  butalbital-acetaminophen-caffeine (FIORICET) 50-325-40 MG tablet Take 1 tablet by mouth every 6 (six) hours as needed for headache. 02/13/19 02/13/20  Laban Emperor, PA-C  gabapentin (NEURONTIN) 300 MG capsule Take 2 capsules (600 mg total) by mouth 2 (two) times daily. 07/25/18   Charlott Rakes, MD  hydrOXYzine (ATARAX/VISTARIL) 50 MG tablet Take 1 tablet (50 mg total) by mouth every 6 (six) hours as needed for anxiety. 10/14/17   Starkes-Perry, Gayland Curry, FNP  meloxicam (MOBIC) 7.5 MG tablet Take 1 tablet (7.5 mg total) by mouth daily. 12/18/18 12/18/19  Harvest Dark, MD  montelukast (SINGULAIR) 10 MG tablet Take 1 tablet (10 mg total) by mouth at bedtime. 04/19/18   Charlott Rakes, MD  ondansetron (ZOFRAN ODT) 4 MG disintegrating tablet Take 1 tablet (4 mg total) by mouth every 8 (eight) hours as needed for nausea or vomiting. 12/18/18   Harvest Dark, MD  QUEtiapine (SEROQUEL) 50 MG tablet Take 1 tablet (50 mg total) by mouth at bedtime. Patient taking differently: Take 150 mg by mouth at bedtime.  10/14/17   Starkes-Perry, Gayland Curry, FNP  sertraline (ZOLOFT) 50 MG tablet Take 1 tablet (50 mg total) by mouth daily. 10/15/17   Starkes-Perry, Gayland Curry, FNP  SUMAtriptan (IMITREX) 100 MG tablet 100 mg orally  at the onset of a migraine, may repeat in 2 hours if headache persists or recurs. Max 200mg  in 24 hrs 07/25/18   Charlott Rakes, MD  topiramate (TOPAMAX) 50 MG tablet Take 2 tablets (100 mg total) by mouth 2 (two) times daily. 07/25/18   Charlott Rakes, MD  traZODone (DESYREL) 100 MG tablet Take 1 tablet (100 mg total) by mouth at bedtime. 10/14/17   Suella Broad, FNP    Allergies Ace inhibitors,  Lisinopril, and Peanuts [peanut oil]  Family History  Problem Relation Age of Onset  . Hypertension Mother   . Hypertension Father   . Diabetes Mellitus II Maternal Grandmother   . Lupus Other     Social History Social History   Tobacco Use  . Smoking status: Current Every Day Smoker    Packs/day: 0.25    Types: Cigarettes  . Smokeless tobacco: Never Used  Substance Use Topics  . Alcohol use: Yes    Alcohol/week: 1.0 standard drinks    Types: 1 Cans of beer per week    Comment: 2 x 40oz bud ice  . Drug use: No     Review of Systems  Constitutional: No fever/chills Cardiovascular: No chest pain. Respiratory: No SOB. Gastrointestinal: No abdominal pain.  No vomiting. Positive for nausea. Musculoskeletal: Negative for musculoskeletal pain. Skin: Negative for rash, abrasions, lacerations, ecchymosis. Neurological: Negative for numbness or tingling. Positive for migraine.   ____________________________________________   PHYSICAL EXAM:  VITAL SIGNS: ED Triage Vitals [02/13/19 0751]  Enc Vitals Group     BP (!) 156/90     Pulse Rate 86     Resp 18     Temp 98.4 F (36.9 C)     Temp Source Oral     SpO2 100 %     Weight 218 lb (98.9 kg)     Height 5\' 4"  (1.626 m)     Head Circumference      Peak Flow      Pain Score 10     Pain Loc      Pain Edu?      Excl. in [employer] [employer]?      Constitutional: Alert and oriented. Well appearing and in no acute distress. Eyes: Conjunctivae are normal. PERRL. EOMI. Head: Atraumatic. ENT:      Ears:      Nose: No congestion/rhinnorhea.      Mouth/Throat: Mucous membranes are moist.  Neck: No stridor.  Cardiovascular: Normal rate, regular rhythm.  Good peripheral circulation. Respiratory: Normal respiratory effort without tachypnea or retractions. Lungs CTAB. Good air entry to the bases with no decreased or absent breath sounds. Musculoskeletal: Full range of motion to all extremities. No gross deformities appreciated. Neurologic:   Normal speech and language. No gross focal neurologic deficits are appreciated.  Skin:  Skin is warm, dry and intact. No rash noted. Psychiatric: Mood and affect are normal. Speech and behavior are normal. Patient exhibits appropriate insight and judgement.   ____________________________________________   LABS (all labs ordered are listed, but only abnormal results are displayed)  Labs Reviewed - No data to display ____________________________________________  EKG   ____________________________________________  RADIOLOGY   Ct Head Wo Contrast  Result Date: 02/13/2019 CLINICAL DATA:  Headache 3 days. EXAM: CT HEAD WITHOUT CONTRAST TECHNIQUE: Contiguous axial images were obtained from the base of the skull through the vertex without intravenous contrast. COMPARISON:  CT head 10/26/2014 FINDINGS: Brain: No evidence of acute infarction, hemorrhage, hydrocephalus, extra-axial collection or mass lesion/mass effect. Vascular: Negative for  hyperdense vessel Skull: Negative Sinuses/Orbits: Negative Other: None IMPRESSION: Negative CT head Electronically Signed   By: Franchot Gallo M.D.   On: 02/13/2019 09:47    ____________________________________________    PROCEDURES  Procedure(s) performed:    Procedures    Medications  sodium chloride 0.9 % bolus 1,000 mL (0 mLs Intravenous Stopped 02/13/19 1335)  ondansetron (ZOFRAN) injection 4 mg (4 mg Intravenous Given 02/13/19 0954)  diphenhydrAMINE (BENADRYL) injection 50 mg (50 mg Intravenous Given 02/13/19 0954)  methylPREDNISolone sodium succinate (SOLU-MEDROL) 125 mg/2 mL injection 125 mg (125 mg Intramuscular Given 02/13/19 0954)  ketorolac (TORADOL) 30 MG/ML injection 30 mg (30 mg Intravenous Given 02/13/19 1020)  SUMAtriptan (IMITREX) injection 6 mg (6 mg Subcutaneous Given 02/13/19 1135)     ____________________________________________   INITIAL IMPRESSION / ASSESSMENT AND PLAN / ED COURSE  Pertinent labs & imaging  results that were available during my care of the patient were reviewed by me and considered in my medical decision making (see chart for details).  Review of the [place] CSRS was performed in accordance of the [place] prior to dispensing any controlled drugs.   Patient's diagnosis is consistent with migraine.  T- for acute abnormality.  Migraine improved after medications patient feels comfortable to go home.  Patient will be discharged home with prescriptions for Fioricet. Patient is to follow up with neurology as directed.  Referral was given.  Patient is given ED precautions to return to the ED for any worsening or new symptoms.     ____________________________________________  FINAL CLINICAL IMPRESSION(S) / ED DIAGNOSES  Final diagnoses:  Other migraine with status migrainosus, intractable      NEW MEDICATIONS STARTED DURING THIS VISIT:  ED Discharge Orders         Ordered    butalbital-acetaminophen-caffeine (FIORICET) 50-325-40 MG tablet  Every 6 hours PRN     02/13/19 1323              This chart was dictated using voice recognition software/Dragon. Despite best efforts to proofread, errors can occur which can change the meaning. Any change was purely unintentional.    Laban Emperor, PA-C 02/13/19 1513    Vanessa [place], MD 02/14/19 [phone]

## 2019-02-13 NOTE — ED Notes (Signed)
Iv removed from left ac  Lm edt

## 2019-02-13 NOTE — ED Notes (Signed)
Pt given meal tray.

## 2019-02-13 NOTE — ED Notes (Signed)
See triage note   Presents with a 3 day hx of headache  States pain is mainly to frontal area  And is having bilateral ear pain

## 2019-02-13 NOTE — ED Triage Notes (Signed)
Headache x3 days hx of similar headache, light sensitivity, ear pressure .  Takes meds for migraines ( not working )

## 2019-02-13 NOTE — ED Notes (Signed)
States h/a is easing off   Rates pain 8/10

## 2019-02-14 ENCOUNTER — Ambulatory Visit: Payer: Medicaid Other | Admitting: Pharmacy Technician

## 2019-02-14 DIAGNOSIS — Z79899 Other long term (current) drug therapy: Secondary | ICD-10-CM

## 2019-02-14 NOTE — Progress Notes (Signed)
Patient stated that her disability claim has been approved by a judge.  Doesn't know how much she will receive.  When she receives approval will provide a copy to [facility].  Patient stated that rent is being paid by the [employer] grant for 6 months.    [facility]

## 2019-03-01 ENCOUNTER — Other Ambulatory Visit: Payer: Self-pay | Admitting: Family Medicine

## 2019-03-01 DIAGNOSIS — M5416 Radiculopathy, lumbar region: Secondary | ICD-10-CM

## 2019-03-19 ENCOUNTER — Other Ambulatory Visit: Payer: Self-pay

## 2019-03-19 ENCOUNTER — Telehealth: Payer: Self-pay | Admitting: Family Medicine

## 2019-03-19 ENCOUNTER — Ambulatory Visit: Payer: Medicaid Other | Attending: Family Medicine | Admitting: Family Medicine

## 2019-03-19 DIAGNOSIS — H527 Unspecified disorder of refraction: Secondary | ICD-10-CM | POA: Diagnosis not present

## 2019-03-19 DIAGNOSIS — J4520 Mild intermittent asthma, uncomplicated: Secondary | ICD-10-CM | POA: Diagnosis not present

## 2019-03-19 DIAGNOSIS — M5416 Radiculopathy, lumbar region: Secondary | ICD-10-CM

## 2019-03-19 DIAGNOSIS — G43809 Other migraine, not intractable, without status migrainosus: Secondary | ICD-10-CM | POA: Diagnosis not present

## 2019-03-19 DIAGNOSIS — I10 Essential (primary) hypertension: Secondary | ICD-10-CM

## 2019-03-19 DIAGNOSIS — E7800 Pure hypercholesterolemia, unspecified: Secondary | ICD-10-CM

## 2019-03-19 MED ORDER — GABAPENTIN 300 MG PO CAPS: 600.0000 mg | ORAL_CAPSULE | Freq: Two times a day (BID) | ORAL | 3 refills | Status: DC

## 2019-03-19 MED ORDER — ALBUTEROL SULFATE HFA 108 (90 BASE) MCG/ACT IN AERS: 2.0000 | INHALATION_SPRAY | Freq: Four times a day (QID) | RESPIRATORY_TRACT | 3 refills | Status: DC | PRN

## 2019-03-19 MED ORDER — SUMATRIPTAN SUCCINATE 100 MG PO TABS: ORAL_TABLET | ORAL | 1 refills | Status: DC

## 2019-03-19 MED ORDER — MONTELUKAST SODIUM 10 MG PO TABS: 10.0000 mg | ORAL_TABLET | Freq: Every day | ORAL | 3 refills | Status: DC

## 2019-03-19 MED ORDER — AMLODIPINE BESYLATE 10 MG PO TABS: 10.0000 mg | ORAL_TABLET | Freq: Every day | ORAL | 3 refills | Status: DC

## 2019-03-19 MED ORDER — ACETAMINOPHEN-CODEINE #3 300-30 MG PO TABS: 1.0000 | ORAL_TABLET | Freq: Three times a day (TID) | ORAL | 2 refills | Status: DC | PRN

## 2019-03-19 MED ORDER — TOPIRAMATE 50 MG PO TABS: 100.0000 mg | ORAL_TABLET | Freq: Two times a day (BID) | ORAL | 3 refills | Status: DC

## 2019-03-19 MED ORDER — ATORVASTATIN CALCIUM 20 MG PO TABS: 20.0000 mg | ORAL_TABLET | Freq: Every day | ORAL | 3 refills | Status: DC

## 2019-03-19 NOTE — Progress Notes (Signed)
Pain in hips and lower back. ?

## 2019-03-19 NOTE — Telephone Encounter (Signed)
Patient called asking for a home care work. [facility] [address] [place]. Patient stated her legs hips and back is bad.

## 2019-03-19 NOTE — Progress Notes (Signed)
Virtual Visit via Telephone Note  I connected with Hosie Poisson, on 03/19/2019 at 2:17 PM by telephone due to the COVID-19 pandemic and verified that I am speaking with the correct person using two identifiers.   Consent: I discussed the limitations, risks, security and privacy concerns of performing an evaluation and management service by telephone and the availability of in person appointments. I also discussed with the patient that there may be a patient responsible charge related to this service. The patient expressed understanding and agreed to proceed.   Location of Patient: Home  Location of Provider: Clinic   Persons participating in Telemedicine visit: Pia [name]-CMA Dr. Margarita Rana     History of Present Illness: Michealle Camelo is a 43 year old female with a history of hypertension, asthma, migraine, anemia (secondary to menorrhagia from fibroids), chronic low back pain from lumbar degenerative disc disease, slipped capital femoral epiphysis seen for a follow-up visit.  She complains her legs give out on her, pain shoots down her left leg resulting in falls and she is unable to be left at home alone thus requiring PCS services; [facility] but promises to give the clinic a call back with more information. Her hands have been tingling and aching for the last one week with associated numbness but she is not dropping things from her hand and has not noticed any decrease in hand strength; of note she has lost some weight. I had referred her to orthopedics in the past but due to lack of medical coverage she was unable to see them. She was recently approved for disability and now has medicaid.  Currently seeing her Psychiatrist Dr Holley Raring but does not think medications are working. Request Ophthalmology referral She did have an ED visit for migraines 1 month ago and she informs me she runs out of Imitrex early and has  nothing to use for breakthrough migraines.  Past Medical History:  Diagnosis Date  . Asthma   . Back pain   . GERD (gastroesophageal reflux disease)   . Hypertension   . Kidney stone   . Migraines   . Sickle cell trait ([facility])    Allergies  Allergen Reactions  . Ace Inhibitors Swelling  . Lisinopril     Swelling--caused the pt to stay in the hospital  . Peanuts [Peanut Oil]     Swelling--caused the pt to stay in the hospital    Current Outpatient Medications on File Prior to Visit  Medication Sig Dispense Refill  . acetaminophen-codeine (TYLENOL #3) 300-30 MG tablet Take 1 tablet by mouth every 8 (eight) hours as needed for moderate pain. Dx: chronic left hip pain 90 tablet 2  . albuterol (VENTOLIN HFA) 108 (90 Base) MCG/ACT inhaler Inhale 2 puffs into the lungs every 6 (six) hours as needed for wheezing or shortness of breath (wheezing). 1 Inhaler 6  . amLODipine (NORVASC) 10 MG tablet Take 1 tablet (10 mg total) by mouth daily. 30 tablet 3  . atorvastatin (LIPITOR) 20 MG tablet Take 1 tablet (20 mg total) by mouth daily. 30 tablet 3  . butalbital-acetaminophen-caffeine (FIORICET) 50-325-40 MG tablet Take 1 tablet by mouth every 6 (six) hours as needed for headache. 12 tablet 0  . gabapentin (NEURONTIN) 300 MG capsule Take 2 capsules (600 mg total) by mouth 2 (two) times daily. 120 capsule 3  . hydrOXYzine (ATARAX/VISTARIL) 50 MG tablet Take 1 tablet (50 mg total) by mouth every 6 (six) hours as needed  for anxiety. 30 tablet 0  . meloxicam (MOBIC) 7.5 MG tablet Take 1 tablet (7.5 mg total) by mouth daily. 14 tablet 0  . montelukast (SINGULAIR) 10 MG tablet Take 1 tablet (10 mg total) by mouth at bedtime. 30 tablet 3  . ondansetron (ZOFRAN ODT) 4 MG disintegrating tablet Take 1 tablet (4 mg total) by mouth every 8 (eight) hours as needed for nausea or vomiting. 20 tablet 0  . QUEtiapine (SEROQUEL) 50 MG tablet Take 1 tablet (50 mg total) by mouth at bedtime. (Patient taking  differently: Take 150 mg by mouth at bedtime. ) 30 tablet 0  . sertraline (ZOLOFT) 50 MG tablet Take 1 tablet (50 mg total) by mouth daily. 30 tablet 0  . SUMAtriptan (IMITREX) 100 MG tablet 100 mg orally at the onset of a migraine, may repeat in 2 hours if headache persists or recurs. Max 200mg  in 24 hrs 10 tablet 1  . topiramate (TOPAMAX) 50 MG tablet Take 2 tablets (100 mg total) by mouth 2 (two) times daily. 60 tablet 3  . traZODone (DESYREL) 100 MG tablet Take 1 tablet (100 mg total) by mouth at bedtime. 30 tablet 0   No current facility-administered medications on file prior to visit.    Observations/Objective: Awake, alert, oriented x3 Not in acute distress  Assessment and Plan: 1. Other migraine without status migrainosus, not intractable Stable I have increased the amount of Imitrex received from month - topiramate (TOPAMAX) 50 MG tablet; Take 2 tablets (100 mg total) by mouth 2 (two) times daily.  Dispense: 60 tablet; Refill: 3 - SUMAtriptan (IMITREX) 100 MG tablet; 100 mg orally at the onset of a migraine, may repeat in 2 hours if headache persists or recurs. Max 200mg  in 24 hrs  Dispense: 30 tablet; Refill: 1  2. Mild intermittent asthma without complication Controlled - montelukast (SINGULAIR) 10 MG tablet; Take 1 tablet (10 mg total) by mouth at bedtime.  Dispense: 30 tablet; Refill: 3 - albuterol (VENTOLIN HFA) 108 (90 Base) MCG/ACT inhaler; Inhale 2 puffs into the lungs every 6 (six) hours as needed for wheezing or shortness of breath (wheezing).  Dispense: 18 g; Refill: 3  3. Lumbar radiculopathy Uncontrolled Provided the option of orthopedic versus pain management referral especially in the light of her SCFE but she would like to be referred to pain clinic as she is not open to surgical options - Ambulatory referral to Pain Clinic - gabapentin (NEURONTIN) 300 MG capsule; Take 2 capsules (600 mg total) by mouth 2 (two) times daily.  Dispense: 120 capsule; Refill: 3 -  acetaminophen-codeine (TYLENOL #3) 300-30 MG tablet; Take 1 tablet by mouth every 8 (eight) hours as needed for moderate pain. Dx: chronic left hip pain  Dispense: 90 tablet; Refill: 2  4. Pure hypercholesterolemia Stable Will need lipid panel at next in person visit - atorvastatin (LIPITOR) 20 MG tablet; Take 1 tablet (20 mg total) by mouth daily.  Dispense: 30 tablet; Refill: 3  5. Essential hypertension Stable Continue antihypertensive Counseled on blood pressure goal of less than 130/80, low-sodium, DASH diet, medication compliance, 150 minutes of moderate intensity exercise per week. Discussed medication compliance, adverse effects. - amLODipine (NORVASC) 10 MG tablet; Take 1 tablet (10 mg total) by mouth daily.  Dispense: 30 tablet; Refill: 3  6. Refractive errors - Ambulatory referral to Ophthalmology   Follow Up Instructions: 3 months Advised she would need a complete physical exam including mammogram and Pap smear as her hip and low back pain has  precluded her from ongoing Pap smear    Needs healthcare maintenance, Pap smear and mammogram but chronic hip pain precludes this.  Hopefully at her next visit she would have obtain pain medications from the pain clinic and should be able to undergo a Pap smear.  I discussed the assessment and treatment plan with the patient. The patient was provided an opportunity to ask questions and all were answered. The patient agreed with the plan and demonstrated an understanding of the instructions.   The patient was advised to call back or seek an in-person evaluation if the symptoms worsen or if the condition fails to improve as anticipated.     I provided 22 minutes total of non-face-to-face time during this encounter including median intraservice time, reviewing previous notes, labs, imaging, medications, management and patient verbalized understanding.     Charlott Rakes, MD, FAAFP. [facility] [place], [place]   03/19/2019, 2:17 PM

## 2019-03-20 ENCOUNTER — Encounter: Payer: Self-pay | Admitting: Family Medicine

## 2019-03-20 NOTE — Telephone Encounter (Signed)
PCS form has been started for patient.

## 2019-03-23 IMAGING — DX DG CHEST 2V
2 series · 2 of 2 positions shown · non-contrast
Comparison: 09/28/2016 radiograph

CLINICAL DATA: Left-sided chest pain, beneath the breast

EXAM:
CHEST  2 VIEW

[chest pa]
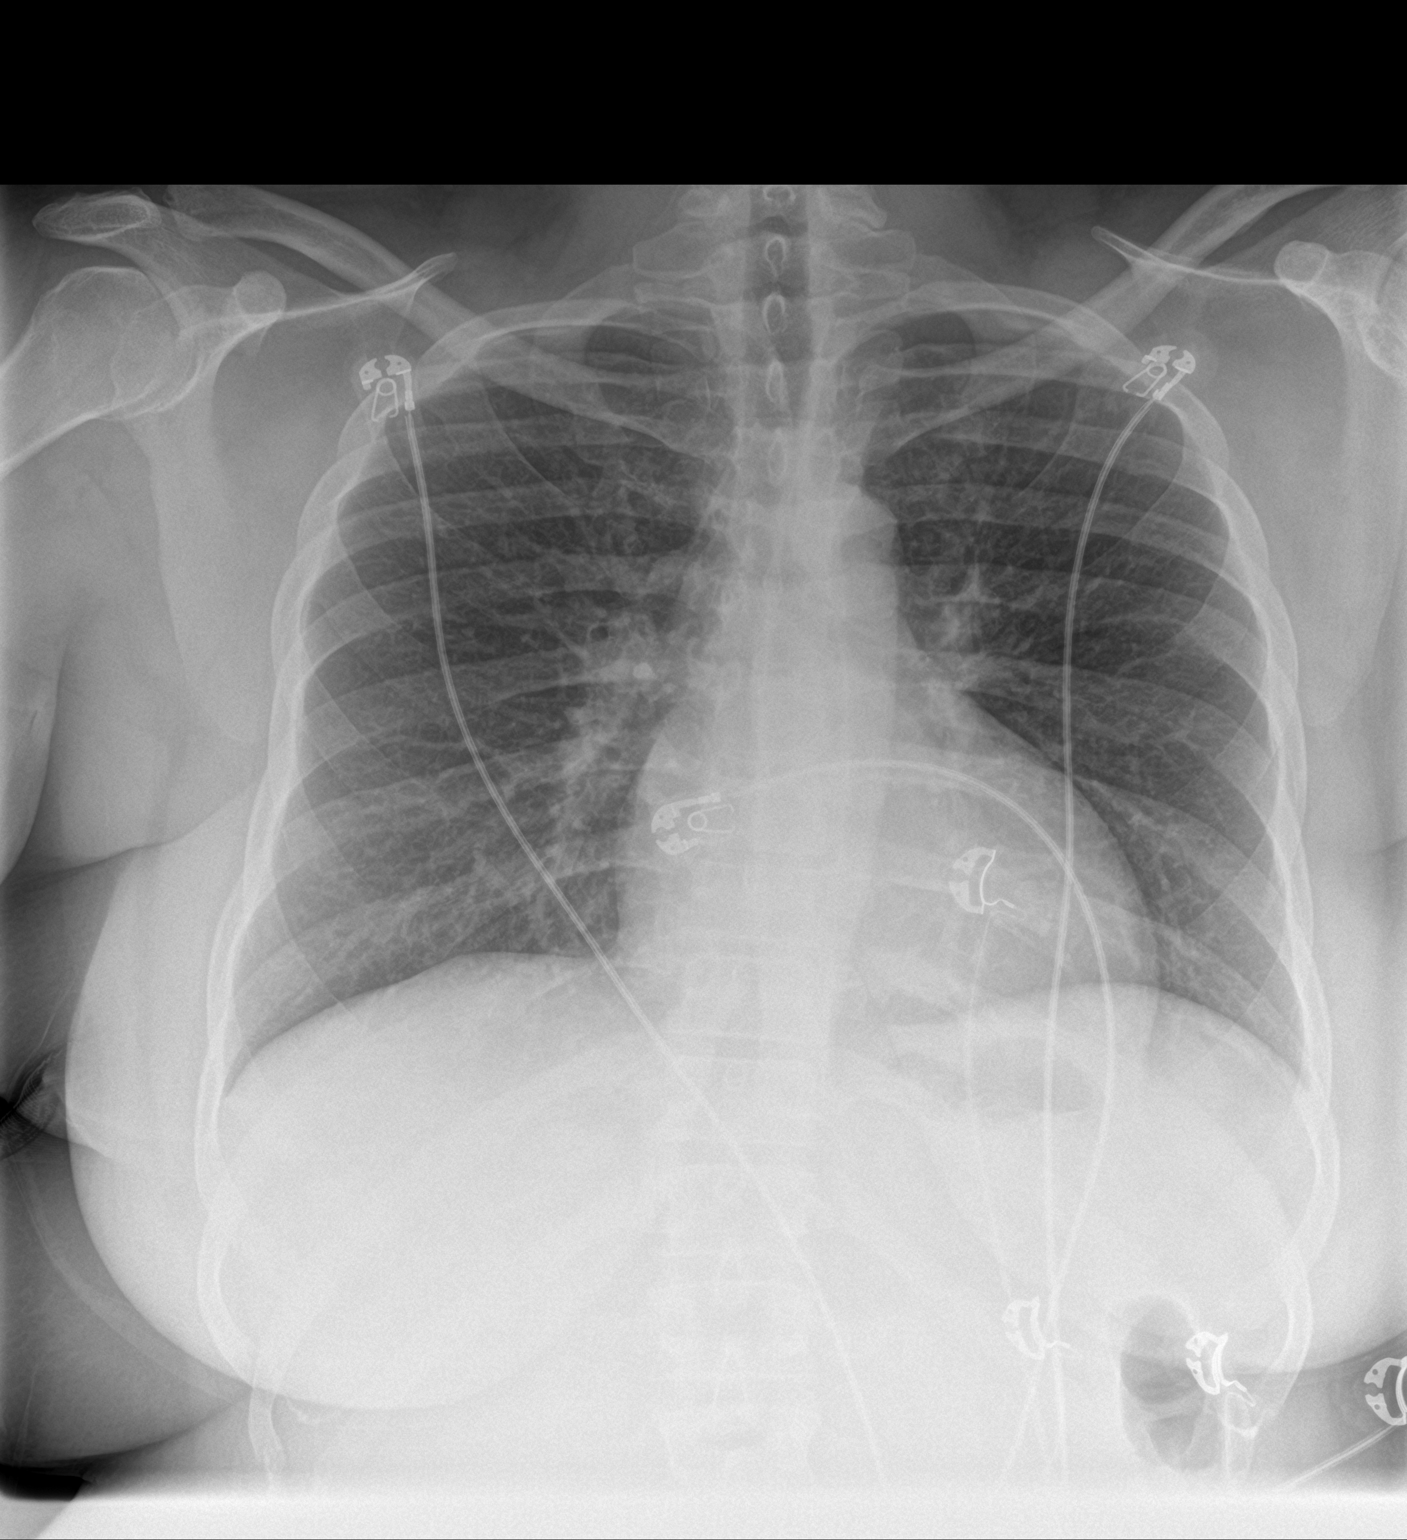

[chest lat]
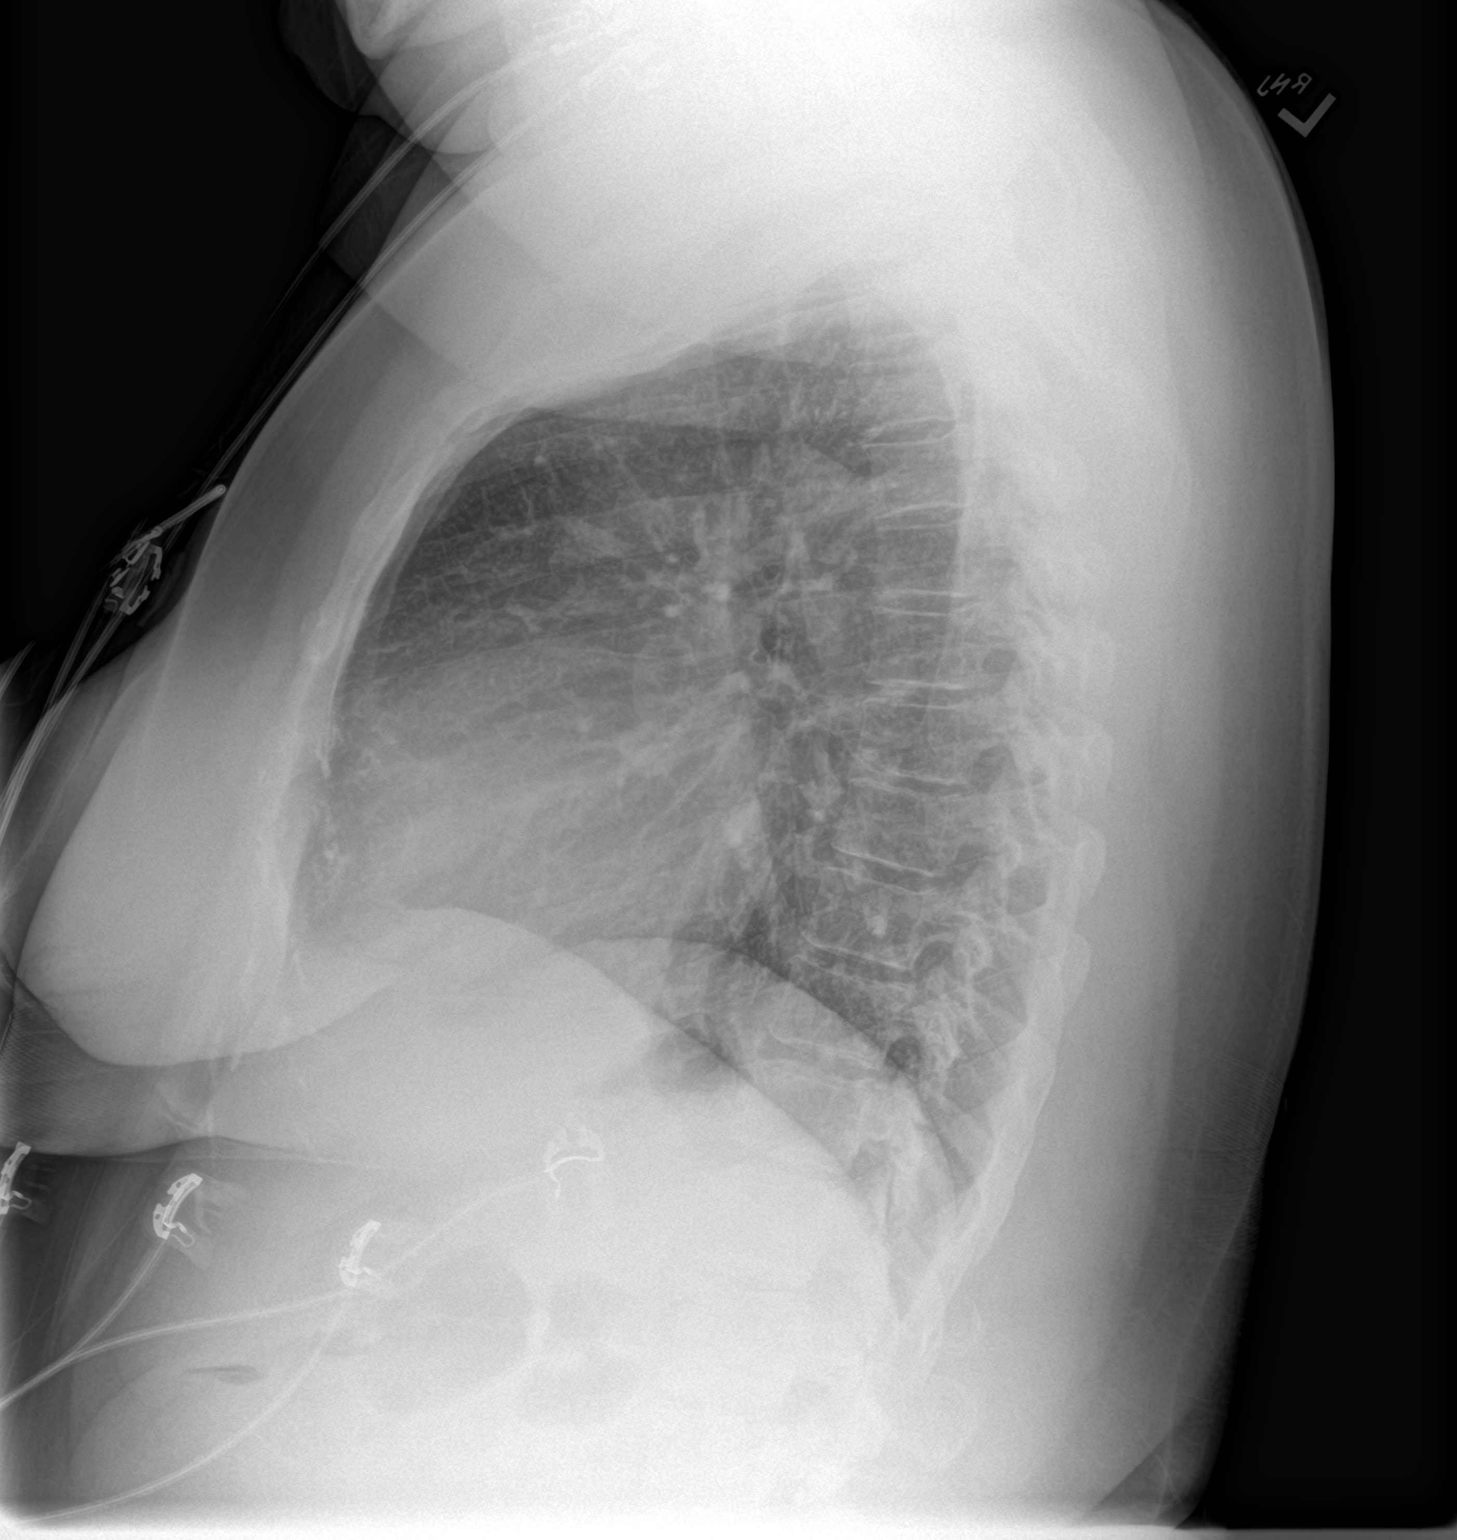

[2 of 2 positions shown; findings below may reference images not displayed]

FINDINGS: The heart size and mediastinal contours are within normal limits.
Both lungs are clear. The visualized skeletal structures are
unremarkable.
IMPRESSION: No active cardiopulmonary disease.

## 2019-04-01 ENCOUNTER — Telehealth: Payer: Self-pay | Admitting: Pharmacy Technician

## 2019-04-01 NOTE — Telephone Encounter (Signed)
Patient has Medicaid with prescription coverage.  No longer meets [facility] eligibility criteria.  Patient notified.  [facility]

## 2019-06-05 ENCOUNTER — Ambulatory Visit: Payer: Medicaid Other | Admitting: Family Medicine

## 2019-06-25 ENCOUNTER — Encounter: Payer: Self-pay | Admitting: Emergency Medicine

## 2019-06-25 ENCOUNTER — Other Ambulatory Visit: Payer: Self-pay

## 2019-06-25 ENCOUNTER — Inpatient Hospital Stay
Admission: EM | Admit: 2019-06-25 | Discharge: 2019-06-27 | DRG: 916 | Disposition: A | Discharge status: Home / Self Care | Payer: Medicaid Other | Attending: Family Medicine | Admitting: Family Medicine

## 2019-06-25 ENCOUNTER — Observation Stay: Payer: Medicaid Other

## 2019-06-25 DIAGNOSIS — E785 Hyperlipidemia, unspecified: Secondary | ICD-10-CM | POA: Diagnosis not present

## 2019-06-25 DIAGNOSIS — M5416 Radiculopathy, lumbar region: Secondary | ICD-10-CM | POA: Diagnosis present

## 2019-06-25 DIAGNOSIS — J45909 Unspecified asthma, uncomplicated: Secondary | ICD-10-CM | POA: Diagnosis present

## 2019-06-25 DIAGNOSIS — G43909 Migraine, unspecified, not intractable, without status migrainosus: Secondary | ICD-10-CM | POA: Diagnosis present

## 2019-06-25 DIAGNOSIS — Z8249 Family history of ischemic heart disease and other diseases of the circulatory system: Secondary | ICD-10-CM

## 2019-06-25 DIAGNOSIS — D509 Iron deficiency anemia, unspecified: Secondary | ICD-10-CM | POA: Diagnosis not present

## 2019-06-25 DIAGNOSIS — Z8269 Family history of other diseases of the musculoskeletal system and connective tissue: Secondary | ICD-10-CM

## 2019-06-25 DIAGNOSIS — Z833 Family history of diabetes mellitus: Secondary | ICD-10-CM

## 2019-06-25 DIAGNOSIS — T782XXA Anaphylactic shock, unspecified, initial encounter: Secondary | ICD-10-CM

## 2019-06-25 DIAGNOSIS — E876 Hypokalemia: Secondary | ICD-10-CM | POA: Diagnosis present

## 2019-06-25 DIAGNOSIS — R1084 Generalized abdominal pain: Secondary | ICD-10-CM

## 2019-06-25 DIAGNOSIS — T783XXA Angioneurotic edema, initial encounter: Principal | ICD-10-CM | POA: Diagnosis present

## 2019-06-25 DIAGNOSIS — R220 Localized swelling, mass and lump, head: Secondary | ICD-10-CM

## 2019-06-25 DIAGNOSIS — F17210 Nicotine dependence, cigarettes, uncomplicated: Secondary | ICD-10-CM | POA: Diagnosis present

## 2019-06-25 DIAGNOSIS — N200 Calculus of kidney: Secondary | ICD-10-CM | POA: Diagnosis present

## 2019-06-25 DIAGNOSIS — M25552 Pain in left hip: Secondary | ICD-10-CM | POA: Diagnosis present

## 2019-06-25 DIAGNOSIS — G8929 Other chronic pain: Secondary | ICD-10-CM | POA: Diagnosis present

## 2019-06-25 DIAGNOSIS — T7840XA Allergy, unspecified, initial encounter: Secondary | ICD-10-CM | POA: Diagnosis present

## 2019-06-25 DIAGNOSIS — F419 Anxiety disorder, unspecified: Secondary | ICD-10-CM | POA: Diagnosis present

## 2019-06-25 DIAGNOSIS — Z98891 History of uterine scar from previous surgery: Secondary | ICD-10-CM

## 2019-06-25 DIAGNOSIS — X58XXXA Exposure to other specified factors, initial encounter: Secondary | ICD-10-CM | POA: Diagnosis present

## 2019-06-25 DIAGNOSIS — R109 Unspecified abdominal pain: Secondary | ICD-10-CM | POA: Diagnosis present

## 2019-06-25 DIAGNOSIS — R8271 Bacteriuria: Secondary | ICD-10-CM | POA: Diagnosis present

## 2019-06-25 DIAGNOSIS — Z791 Long term (current) use of non-steroidal anti-inflammatories (NSAID): Secondary | ICD-10-CM

## 2019-06-25 DIAGNOSIS — I10 Essential (primary) hypertension: Secondary | ICD-10-CM | POA: Diagnosis present

## 2019-06-25 DIAGNOSIS — Z91010 Allergy to peanuts: Secondary | ICD-10-CM

## 2019-06-25 DIAGNOSIS — Z972 Presence of dental prosthetic device (complete) (partial): Secondary | ICD-10-CM

## 2019-06-25 DIAGNOSIS — G43809 Other migraine, not intractable, without status migrainosus: Secondary | ICD-10-CM

## 2019-06-25 DIAGNOSIS — D573 Sickle-cell trait: Secondary | ICD-10-CM | POA: Diagnosis present

## 2019-06-25 DIAGNOSIS — Z79899 Other long term (current) drug therapy: Secondary | ICD-10-CM

## 2019-06-25 DIAGNOSIS — Z87442 Personal history of urinary calculi: Secondary | ICD-10-CM

## 2019-06-25 DIAGNOSIS — Z888 Allergy status to other drugs, medicaments and biological substances status: Secondary | ICD-10-CM

## 2019-06-25 DIAGNOSIS — F332 Major depressive disorder, recurrent severe without psychotic features: Secondary | ICD-10-CM | POA: Diagnosis present

## 2019-06-25 DIAGNOSIS — Z634 Disappearance and death of family member: Secondary | ICD-10-CM

## 2019-06-25 DIAGNOSIS — Z79891 Long term (current) use of opiate analgesic: Secondary | ICD-10-CM

## 2019-06-25 DIAGNOSIS — D259 Leiomyoma of uterus, unspecified: Secondary | ICD-10-CM | POA: Diagnosis present

## 2019-06-25 DIAGNOSIS — Z20822 Contact with and (suspected) exposure to covid-19: Secondary | ICD-10-CM | POA: Diagnosis present

## 2019-06-25 LAB — CBC WITH DIFFERENTIAL/PLATELET
Abs Immature Granulocytes: 0.03 10*3/uL (ref 0.00–0.07)
Basophils Absolute: 0 10*3/uL (ref 0.0–0.1)
Basophils Relative: 0 %
Eosinophils Absolute: 0.5 10*3/uL (ref 0.0–0.5)
Eosinophils Relative: 4 %
HCT: 36.1 % (ref 36.0–46.0)
Hemoglobin: 10.8 g/dL — ABNORMAL LOW (ref 12.0–15.0)
Immature Granulocytes: 0 %
Lymphocytes Relative: 29 %
Lymphs Abs: 3.5 10*3/uL (ref 0.7–4.0)
MCH: 20.5 pg — ABNORMAL LOW (ref 26.0–34.0)
MCHC: 29.9 g/dL — ABNORMAL LOW (ref 30.0–36.0)
MCV: 68.5 fL — ABNORMAL LOW (ref 80.0–100.0)
Monocytes Absolute: 0.5 10*3/uL (ref 0.1–1.0)
Monocytes Relative: 4 %
Neutro Abs: 7.5 10*3/uL (ref 1.7–7.7)
Neutrophils Relative %: 63 %
Platelets: 449 10*3/uL — ABNORMAL HIGH (ref 150–400)
RBC: 5.27 MIL/uL — ABNORMAL HIGH (ref 3.87–5.11)
RDW: 18.6 % — ABNORMAL HIGH (ref 11.5–15.5)
WBC: 12.1 10*3/uL — ABNORMAL HIGH (ref 4.0–10.5)
nRBC: 0 % (ref 0.0–0.2)

## 2019-06-25 LAB — GLUCOSE, CAPILLARY: Glucose-Capillary: 158 mg/dL — ABNORMAL HIGH (ref 70–99)

## 2019-06-25 LAB — BASIC METABOLIC PANEL
Anion gap: 9 (ref 5–15)
BUN: 9 mg/dL (ref 6–20)
CO2: 25 mmol/L (ref 22–32)
Calcium: 8.7 mg/dL — ABNORMAL LOW (ref 8.9–10.3)
Chloride: 103 mmol/L (ref 98–111)
Creatinine, Ser: 0.66 mg/dL (ref 0.44–1.00)
GFR calc Af Amer: >60 mL/min (ref >60)
GFR calc non Af Amer: >60 mL/min (ref >60)
Glucose, Bld: 107 mg/dL — ABNORMAL HIGH (ref 70–99)
Potassium: 3.1 mmol/L — ABNORMAL LOW (ref 3.5–5.1)
Sodium: 137 mmol/L (ref 135–145)

## 2019-06-25 LAB — MRSA PCR SCREENING: MRSA by PCR: POSITIVE — AB

## 2019-06-25 LAB — MAGNESIUM: Magnesium: 1.9 mg/dL (ref 1.7–2.4)

## 2019-06-25 LAB — SARS CORONAVIRUS 2 (TAT 6-24 HRS): SARS Coronavirus 2: NEGATIVE

## 2019-06-25 LAB — PROCALCITONIN: Procalcitonin: <0.1 ng/mL

## 2019-06-25 LAB — POTASSIUM: Potassium: 4 mmol/L (ref 3.5–5.1)

## 2019-06-25 LAB — LIPASE, BLOOD: Lipase: 44 U/L (ref 11–51)

## 2019-06-25 MED ORDER — SODIUM CHLORIDE 0.9 % IV SOLN
INTRAVENOUS | Status: DC
  Administered 2019-06-25: 125 mL/h via INTRAVENOUS

## 2019-06-25 MED ORDER — ACETAMINOPHEN 325 MG PO TABS
650.0000 mg | ORAL_TABLET | Freq: Four times a day (QID) | ORAL | Status: DC | PRN
  Filled 2019-06-25: qty 2

## 2019-06-25 MED ORDER — SUMATRIPTAN SUCCINATE 50 MG PO TABS
50.0000 mg | ORAL_TABLET | ORAL | Status: DC | PRN
  Filled 2019-06-25: qty 1

## 2019-06-25 MED ORDER — DIPHENHYDRAMINE HCL 50 MG/ML IJ SOLN
25.0000 mg | Freq: Once | INTRAMUSCULAR | Status: AC
  Administered 2019-06-25: 25 mg via INTRAVENOUS
  Filled 2019-06-25: qty 1

## 2019-06-25 MED ORDER — HYDRALAZINE HCL 20 MG/ML IJ SOLN: 5.0000 mg | INTRAMUSCULAR | Status: DC | PRN

## 2019-06-25 MED ORDER — METHYLPREDNISOLONE SODIUM SUCC 125 MG IJ SOLR: 125.0000 mg | Freq: Once | INTRAMUSCULAR | Status: AC

## 2019-06-25 MED ORDER — EPINEPHRINE 0.3 MG/0.3ML IJ SOAJ
INTRAMUSCULAR | Status: AC
  Administered 2019-06-25: 0.3 mg via INTRAMUSCULAR
  Filled 2019-06-25: qty 0.3

## 2019-06-25 MED ORDER — MONTELUKAST SODIUM 10 MG PO TABS
10.0000 mg | ORAL_TABLET | Freq: Every day | ORAL | Status: DC
  Administered 2019-06-25 – 2019-06-26 (×2): 10 mg via ORAL
  Filled 2019-06-25 (×3): qty 1

## 2019-06-25 MED ORDER — DIPHENHYDRAMINE HCL 50 MG/ML IJ SOLN: 25.0000 mg | INTRAMUSCULAR | Status: AC

## 2019-06-25 MED ORDER — ALBUTEROL SULFATE HFA 108 (90 BASE) MCG/ACT IN AERS
2.0000 | INHALATION_SPRAY | RESPIRATORY_TRACT | Status: DC | PRN
  Filled 2019-06-25: qty 6.7

## 2019-06-25 MED ORDER — METHYLPREDNISOLONE SODIUM SUCC 125 MG IJ SOLR
60.0000 mg | Freq: Two times a day (BID) | INTRAMUSCULAR | Status: DC
  Administered 2019-06-25 – 2019-06-27 (×4): 60 mg via INTRAVENOUS
  Filled 2019-06-25 (×4): qty 2

## 2019-06-25 MED ORDER — POTASSIUM CHLORIDE CRYS ER 20 MEQ PO TBCR
40.0000 meq | EXTENDED_RELEASE_TABLET | Freq: Once | ORAL | Status: AC
  Administered 2019-06-25: 40 meq via ORAL
  Filled 2019-06-25: qty 2

## 2019-06-25 MED ORDER — CHLORHEXIDINE GLUCONATE CLOTH 2 % EX PADS
6.0000 | MEDICATED_PAD | Freq: Every day | CUTANEOUS | Status: DC
  Administered 2019-06-25: 6 via TOPICAL

## 2019-06-25 MED ORDER — EPINEPHRINE 0.3 MG/0.3ML IJ SOAJ: 0.3000 mg | Freq: Once | INTRAMUSCULAR | Status: AC

## 2019-06-25 MED ORDER — HYDROXYZINE HCL 25 MG PO TABS
50.0000 mg | ORAL_TABLET | Freq: Four times a day (QID) | ORAL | Status: DC | PRN
  Filled 2019-06-25: qty 1

## 2019-06-25 MED ORDER — FAMOTIDINE IN NACL 20-0.9 MG/50ML-% IV SOLN
20.0000 mg | Freq: Once | INTRAVENOUS | Status: AC
  Administered 2019-06-25: 05:00:00 20 mg via INTRAVENOUS

## 2019-06-25 MED ORDER — OXYCODONE HCL 5 MG PO TABS
5.0000 mg | ORAL_TABLET | Freq: Four times a day (QID) | ORAL | Status: DC | PRN
  Administered 2019-06-25 – 2019-06-27 (×5): 5 mg via ORAL
  Filled 2019-06-25 (×5): qty 1

## 2019-06-25 MED ORDER — FAMOTIDINE 20 MG PO TABS
20.0000 mg | ORAL_TABLET | Freq: Two times a day (BID) | ORAL | Status: DC
  Administered 2019-06-25 – 2019-06-27 (×4): 20 mg via ORAL
  Filled 2019-06-25 (×6): qty 1

## 2019-06-25 MED ORDER — DIPHENHYDRAMINE HCL 50 MG/ML IJ SOLN
25.0000 mg | Freq: Four times a day (QID) | INTRAMUSCULAR | Status: AC
  Administered 2019-06-25 – 2019-06-26 (×5): 25 mg via INTRAVENOUS
  Filled 2019-06-25 (×5): qty 1

## 2019-06-25 MED ORDER — AMLODIPINE BESYLATE 10 MG PO TABS
10.0000 mg | ORAL_TABLET | Freq: Every day | ORAL | Status: DC
  Administered 2019-06-25: 10 mg via ORAL
  Filled 2019-06-25: qty 1

## 2019-06-25 MED ORDER — TOPIRAMATE 100 MG PO TABS
100.0000 mg | ORAL_TABLET | Freq: Two times a day (BID) | ORAL | Status: DC
  Administered 2019-06-25 – 2019-06-27 (×4): 100 mg via ORAL
  Filled 2019-06-25 (×6): qty 1

## 2019-06-25 MED ORDER — FAMOTIDINE IN NACL 20-0.9 MG/50ML-% IV SOLN: 20.0000 mg | Freq: Two times a day (BID) | INTRAVENOUS | Status: DC

## 2019-06-25 MED ORDER — DIPHENHYDRAMINE HCL 50 MG/ML IJ SOLN
INTRAMUSCULAR | Status: AC
  Administered 2019-06-25: 05:00:00 25 mg via INTRAVENOUS
  Filled 2019-06-25: qty 1

## 2019-06-25 MED ORDER — QUETIAPINE FUMARATE 25 MG PO TABS
150.0000 mg | ORAL_TABLET | Freq: Every day | ORAL | Status: DC
  Administered 2019-06-25 – 2019-06-26 (×2): 150 mg via ORAL
  Filled 2019-06-25 (×2): qty 6

## 2019-06-25 MED ORDER — ENOXAPARIN SODIUM 40 MG/0.4ML ~~LOC~~ SOLN
40.0000 mg | Freq: Two times a day (BID) | SUBCUTANEOUS | Status: DC
  Administered 2019-06-25 – 2019-06-27 (×4): 40 mg via SUBCUTANEOUS
  Filled 2019-06-25 (×4): qty 0.4

## 2019-06-25 MED ORDER — GLUCERNA SHAKE PO LIQD
237.0000 mL | Freq: Two times a day (BID) | ORAL | Status: DC
  Administered 2019-06-25 – 2019-06-27 (×4): 237 mL via ORAL

## 2019-06-25 MED ORDER — METHYLPREDNISOLONE SODIUM SUCC 125 MG IJ SOLR
INTRAMUSCULAR | Status: AC
  Administered 2019-06-25: 125 mg via INTRAVENOUS
  Filled 2019-06-25: qty 2

## 2019-06-25 MED ORDER — TRANEXAMIC ACID-NACL 1000-0.7 MG/100ML-% IV SOLN
1000.0000 mg | INTRAVENOUS | Status: AC
  Administered 2019-06-25: 1000 mg via INTRAVENOUS
  Filled 2019-06-25: qty 100

## 2019-06-25 MED ORDER — ONDANSETRON HCL 4 MG/2ML IJ SOLN: 4.0000 mg | Freq: Three times a day (TID) | INTRAMUSCULAR | Status: DC | PRN

## 2019-06-25 MED ORDER — SERTRALINE HCL 50 MG PO TABS
50.0000 mg | ORAL_TABLET | Freq: Every day | ORAL | Status: DC
  Administered 2019-06-25 – 2019-06-27 (×3): 50 mg via ORAL
  Filled 2019-06-25 (×3): qty 1

## 2019-06-25 MED ORDER — TRAZODONE HCL 100 MG PO TABS
100.0000 mg | ORAL_TABLET | Freq: Every day | ORAL | Status: DC
  Administered 2019-06-25 – 2019-06-26 (×2): 100 mg via ORAL
  Filled 2019-06-25 (×2): qty 1

## 2019-06-25 MED ORDER — DIPHENHYDRAMINE HCL 50 MG/ML IJ SOLN: 25.0000 mg | Freq: Two times a day (BID) | INTRAMUSCULAR | Status: DC

## 2019-06-25 MED ORDER — EPINEPHRINE 0.3 MG/0.3ML IJ SOAJ
0.3000 mg | INTRAMUSCULAR | Status: DC | PRN
  Filled 2019-06-25: qty 0.3

## 2019-06-25 MED ORDER — ATORVASTATIN CALCIUM 20 MG PO TABS
20.0000 mg | ORAL_TABLET | Freq: Every day | ORAL | Status: DC
  Administered 2019-06-25 – 2019-06-27 (×3): 20 mg via ORAL
  Filled 2019-06-25 (×3): qty 1

## 2019-06-25 MED ORDER — NICOTINE 21 MG/24HR TD PT24
21.0000 mg | MEDICATED_PATCH | Freq: Every day | TRANSDERMAL | Status: DC
  Administered 2019-06-25 – 2019-06-27 (×3): 21 mg via TRANSDERMAL
  Filled 2019-06-25 (×3): qty 1

## 2019-06-25 NOTE — ED Notes (Signed)
Called floor for pt's room assignment. RN at lunch and unavailable. Will try back in few minutes then roll up with pt. Secretary aware and states understanding.

## 2019-06-25 NOTE — H&P (Signed)
History and Physical    Kelly Byrd [id number] DOB: [birth year] DOA: 06/25/2019  Referring MD/NP/PA:   PCP: Charlott Rakes, MD   Patient coming from:  The patient is coming from home.  At baseline, pt is independent for most of ADL.        Chief Complaint: allergic reaction  HPI: Kelly Byrd is a 44 y.o. female with medical history significant of history of angioedema requiring admission in 2017 while she was on lisinopril, hypertension, hyperlipidemia, asthma, GERD, depression, anxiety, sickle cell trait, tobacco abuse, migraine headache, kidney stone, fibroid, menometrorrhagia, cocaine abuse, anemia, who presents with allergic reaction.  Pt states that she came back from beach yesterday, Since last night she started feeling itchy. She developed facial and eye swelling, tongue swelling.  She has some mild shortness breath, no cough, chest pain fever or chills.  She reports diffuse abdominal pain, which is mild, constant, aching, nonradiating.  Denies nausea, vomiting, diarrhea or abdominal pain.  No symptoms of UTI or unilateral weakness. She dose not have new medications recently. Pt was given 125 mg Solu-Medrol, EpiPen, Pepcid 20 mg and Benadryl 25 mg in ED. Her symptoms have no significant change.  ED Course: pt was found to have WBC 12.1, pending COVID-19 PCR, potassium 3.1, renal function okay, temperature normal, elevated blood pressure 188/96 --> 147/97, heart rate 72, RR 22, oxygen saturation 92% on room air. Pt is placed in SDU for obs.  Review of Systems:   General: no fevers, chills, no body weight gain, has fatigue HEENT: Has right eye periorbital swelling. has welling to the left side of her tongue Respiratory: no dyspnea, coughing, wheezing.  CV: no chest pain, no palpitations GI: no nausea, vomiting, abdominal pain, diarrhea, constipation GU: no dysuria, burning on urination, increased urinary frequency, hematuria  Ext: no leg edema Neuro: no unilateral  weakness, numbness, or tingling, no vision change or hearing loss Skin: no rash, no skin tear. MSK: No muscle spasm, no deformity, no limitation of range of movement in spin Heme: No easy bruising.  Travel history: No recent long distant travel.  Allergy:  Allergies  Allergen Reactions  . Ace Inhibitors Swelling  . Lisinopril     Swelling--caused the pt to stay in the hospital  . Peanuts [Peanut Oil]     Swelling--caused the pt to stay in the hospital    Past Medical History:  Diagnosis Date  . Asthma   . Back pain   . GERD (gastroesophageal reflux disease)   . Hypertension   . Kidney stone   . Migraines   . Sickle cell trait [facility])     Past Surgical History:  Procedure Laterality Date  . CESAREAN SECTION     2    Social History:  reports that she has been smoking cigarettes. She has been smoking about 0.25 packs per day. She has never used smokeless tobacco. She reports current alcohol use of about 1.0 standard drinks of alcohol per week. She reports that she does not use drugs.  Family History:  Family History  Problem Relation Age of Onset  . Hypertension Mother   . Hypertension Father   . Diabetes Mellitus II Maternal Grandmother   . Lupus Other      Prior to Admission medications   Medication Sig Start Date End Date Taking? Authorizing Provider  albuterol (VENTOLIN HFA) 108 (90 Base) MCG/ACT inhaler Inhale 2 puffs into the lungs every 6 (six) hours as needed for wheezing or shortness of breath (wheezing). 03/19/19  Yes Charlott Rakes, MD  amLODipine (NORVASC) 10 MG tablet Take 1 tablet (10 mg total) by mouth daily. 03/19/19  Yes Charlott Rakes, MD  atorvastatin (LIPITOR) 20 MG tablet Take 1 tablet (20 mg total) by mouth daily. 03/19/19  Yes Charlott Rakes, MD  butalbital-acetaminophen-caffeine (FIORICET) 50-325-40 MG tablet Take 1 tablet by mouth every 6 (six) hours as needed for headache. 02/13/19 02/13/20 Yes Laban Emperor, PA-C  gabapentin (NEURONTIN) 300  MG capsule Take 2 capsules (600 mg total) by mouth 2 (two) times daily. 03/19/19  Yes Charlott Rakes, MD  hydrOXYzine (ATARAX/VISTARIL) 50 MG tablet Take 1 tablet (50 mg total) by mouth every 6 (six) hours as needed for anxiety. 10/14/17  Yes Starkes-Perry, Gayland Curry, FNP  meloxicam (MOBIC) 7.5 MG tablet Take 1 tablet (7.5 mg total) by mouth daily. 12/18/18 12/18/19 Yes Harvest Dark, MD  acetaminophen-codeine (TYLENOL #3) 300-30 MG tablet Take 1 tablet by mouth every 8 (eight) hours as needed for moderate pain. Dx: chronic left hip pain 03/19/19   Charlott Rakes, MD  montelukast (SINGULAIR) 10 MG tablet Take 1 tablet (10 mg total) by mouth at bedtime. 03/19/19   Charlott Rakes, MD  ondansetron (ZOFRAN ODT) 4 MG disintegrating tablet Take 1 tablet (4 mg total) by mouth every 8 (eight) hours as needed for nausea or vomiting. 12/18/18   Harvest Dark, MD  QUEtiapine (SEROQUEL) 50 MG tablet Take 1 tablet (50 mg total) by mouth at bedtime. Patient taking differently: Take 150 mg by mouth at bedtime.  10/14/17   Starkes-Perry, Gayland Curry, FNP  sertraline (ZOLOFT) 50 MG tablet Take 1 tablet (50 mg total) by mouth daily. 10/15/17   Starkes-Perry, Gayland Curry, FNP  SUMAtriptan (IMITREX) 100 MG tablet 100 mg orally at the onset of a migraine, may repeat in 2 hours if headache persists or recurs. Max 200mg  in 24 hrs 03/19/19   Charlott Rakes, MD  topiramate (TOPAMAX) 50 MG tablet Take 2 tablets (100 mg total) by mouth 2 (two) times daily. 03/19/19   Charlott Rakes, MD  traZODone (DESYREL) 100 MG tablet Take 1 tablet (100 mg total) by mouth at bedtime. 10/14/17   Suella Broad, FNP    Physical Exam: Vitals:   06/25/19 1200 06/25/19 1300 06/25/19 1400 06/25/19 1500  BP: (!) 160/87 (!) 152/84 (!) 146/83 (!) 152/89  Pulse: 74 68 72 68  Resp: 18 17 20 16   Temp:  (!) 96.7 F (35.9 C)  98.1 F (36.7 C)  TempSrc:  Oral    SpO2: 98% 98% 98% 97%  Weight:  109.4 kg    Height:  5\' 4"  (1.626 m)      General: Not in acute distress HEENT:   Has right eye periorbital swelling. Left eye has mild swelling. Pt has welling to the left side of her tongue, no trismus.  Airway is patent at this time, no stridor, able to swallow her secretions.         Eyes: PERRL, EOMI, no scleral icterus.       ENT: No discharge from the ears and nose       Neck: No JVD, no bruit, no mass felt. Heme: No neck lymph node enlargement. Cardiac: S1/S2, RRR, No murmurs, No gallops or rubs. Respiratory: No rales, wheezing, rhonchi or rubs. GI: Soft, nondistended, nontender, no rebound pain, no organomegaly, BS present. GU: No hematuria Ext: No pitting leg edema bilaterally. 2+DP/PT pulse bilaterally. Musculoskeletal: No joint deformities, No joint redness or warmth, no limitation of ROM in spin. Skin:  No rashes.  Neuro: Alert, oriented X3, cranial nerves II-XII grossly intact, moves all extremities normally.   Psych: Patient is not psychotic, no suicidal or hemocidal ideation.  Labs on Admission: I have personally reviewed following labs and imaging studies  CBC: Recent Labs  Lab 06/25/19 0441  WBC 12.1*  NEUTROABS 7.5  HGB 10.8*  HCT 36.1  MCV 68.5*  PLT 123XX123*   Basic Metabolic Panel: Recent Labs  Lab 06/25/19 0441  NA 137  K 3.1*  CL 103  CO2 25  GLUCOSE 107*  BUN 9  CREATININE 0.66  CALCIUM 8.7*   GFR: Estimated Creatinine Clearance: 110.8 mL/min (by C-G formula based on SCr of 0.66 mg/dL). Liver Function Tests: No results for input(s): AST, ALT, ALKPHOS, BILITOT, PROT, ALBUMIN in the last 168 hours. Recent Labs  Lab 06/25/19 0441  LIPASE 44   No results for input(s): AMMONIA in the last 168 hours. Coagulation Profile: No results for input(s): INR, PROTIME in the last 168 hours. Cardiac Enzymes: No results for input(s): CKTOTAL, CKMB, CKMBINDEX, TROPONINI in the last 168 hours. BNP (last 3 results) No results for input(s): PROBNP in the last 8760 hours. HbA1C: No results for  input(s): HGBA1C in the last 72 hours. CBG: Recent Labs  Lab 06/25/19 1310  GLUCAP 158*   Lipid Profile: No results for input(s): CHOL, HDL, LDLCALC, TRIG, CHOLHDL, LDLDIRECT in the last 72 hours. Thyroid Function Tests: No results for input(s): TSH, T4TOTAL, FREET4, T3FREE, THYROIDAB in the last 72 hours. Anemia Panel: No results for input(s): VITAMINB12, FOLATE, FERRITIN, TIBC, IRON, RETICCTPCT in the last 72 hours. Urine analysis:    Component Value Date/Time   COLORURINE YELLOW (A) 12/18/2018 1200   APPEARANCEUR CLOUDY (A) 12/18/2018 1200   APPEARANCEUR Hazy 04/20/2012 1349   LABSPEC 1.014 12/18/2018 1200   LABSPEC 1.020 04/20/2012 1349   PHURINE 7.0 12/18/2018 1200   GLUCOSEU NEGATIVE 12/18/2018 1200   GLUCOSEU Negative 04/20/2012 1349   HGBUR MODERATE (A) 12/18/2018 1200   BILIRUBINUR NEGATIVE 12/18/2018 1200   BILIRUBINUR Negative 04/20/2012 1349   KETONESUR NEGATIVE 12/18/2018 1200   PROTEINUR 100 (A) 12/18/2018 1200   NITRITE POSITIVE (A) 12/18/2018 1200   LEUKOCYTESUR TRACE (A) 12/18/2018 1200   LEUKOCYTESUR Negative 04/20/2012 1349   Sepsis Labs: @LABRCNTIP (procalcitonin:4,lacticidven:4) ) Recent Results (from the past 240 hour(s))  SARS CORONAVIRUS 2 (TAT 6-24 HRS) Nasopharyngeal Nasopharyngeal Swab     Status: None   Collection Time: 06/25/19 10:15 AM   Specimen: Nasopharyngeal Swab  Result Value Ref Range Status   SARS Coronavirus 2 NEGATIVE NEGATIVE Final    Comment: (NOTE) SARS-CoV-2 target nucleic acids are NOT DETECTED. The SARS-CoV-2 RNA is generally detectable in upper and lower respiratory specimens during the acute phase of infection. Negative results do not preclude SARS-CoV-2 infection, do not rule out co-infections with other pathogens, and should not be used as the sole basis for treatment or other patient management decisions. Negative results must be combined with clinical observations, patient history, and epidemiological information. The  expected result is Negative. Fact Sheet for Patients: SugarRoll.be Fact Sheet for Healthcare Providers: https://www.woods-mathews.com/ This test is not yet approved or cleared by the [place] FDA and  has been authorized for detection and/or diagnosis of SARS-CoV-2 by FDA under an Emergency Use Authorization (EUA). This EUA will remain  in effect (meaning this test can be used) for the duration of the COVID-19 declaration under Section 56 4(b)(1) of the Act, 21 U.S.C. section 360bbb-3(b)(1), unless the authorization is terminated or  revoked sooner. Performed at [facility], [place] [address] [place], [place] [address]   MRSA PCR Screening     Status: Abnormal   Collection Time: 06/25/19  1:34 PM   Specimen: Nasopharyngeal  Result Value Ref Range Status   MRSA by PCR POSITIVE (A) NEGATIVE Final    Comment:        The GeneXpert MRSA Assay (FDA approved for NASAL specimens only), is one component of a comprehensive MRSA colonization surveillance program. It is not intended to diagnose MRSA infection nor to guide or monitor treatment for MRSA infections. RESULT CALLED TO, READ BACK BY AND VERIFIED WITH: Adalberto Ill RN AT [id number] ON 06/25/19 SNG  Performed at [facility], [address] [place], [place] [address]      Radiological Exams on Admission: CT ABDOMEN PELVIS WO CONTRAST  Result Date: 06/25/2019 CLINICAL DATA:  Abdominal pain EXAM: CT ABDOMEN AND PELVIS WITHOUT CONTRAST TECHNIQUE: Multidetector CT imaging of the abdomen and pelvis was performed following the standard protocol without IV contrast. COMPARISON:  12/18/2018 FINDINGS: Lower chest: No acute abnormality. Hepatobiliary: No focal liver abnormality is seen. No gallstones, gallbladder wall thickening, or biliary dilatation. Pancreas: Unremarkable. Spleen: Unremarkable. Adrenals/Urinary Tract: Adrenals are unremarkable. A 2 mm nonobstructing calculus of the  lower pole of the left kidney the is again identified. There is no hydronephrosis. Bladder is unremarkable. Stomach/Bowel: Stomach is within normal limits. Bowel is normal in caliber. Normal appendix. Vascular/Lymphatic: Aortic atherosclerosis. No enlarged abdominal or pelvic lymph nodes. Reproductive: Enlarged with several calcifications probably associated with fibroids. Ovaries are unremarkable by CT. Other: No ascites.  No abdominal wall hernia. 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 Chronic findings at the right hip. IMPRESSION: No new findings. Small nonobstructing left renal calculus. Enlarged, likely fibroid uterus. Electronically Signed   By: Macy Mis M.D.   On: 06/25/2019 13:16     EKG: Not done in ED, will get one.   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 Her mother recently had a similar episode of swelling in the face and tongue and had to be hospitalized and taken to the ICU. -place on SDU for obs -will continue Solu-Medrol 60 mg tid -IV Benadryl 25 mg bid -IV pepcid 20 mg bid -prn EpiPen -IVF: 125 cc/h -check C4, C3, C1 inhibitor level,  -consulted Dr. Lanney Gins of ICU  Abdominal pain: Etiology is not clear.  CT scan showed small nonobstructive left renal calculus and possible fibrinoid otherwise not impressive. -As needed Zofran for nausea and oxycodone for pain -Check lipase level  Migraine -As needed sumatriptan and Topamax  Anemia, iron deficiency: Hemoglobin 10.8, stable. -Follow-up by CBC  Hypertension -Amlodipine -As needed hydralazine  Asthma: Stable -Bronchodilators and Singulair  Hyperlipidemia -Lipitor  Major depressive  disorder, recurrent severe without psychotic features and anxiety -Seroquel, Zoloft, hydroxyzine  Hypokalemia: K= 3.1 on admission. - Repleted - Check Mg level   DVT ppx: SQ Lovenox Code Status: Full code Family Communication: not done, no family member is at bed side.  Disposition Plan:  Anticipate discharge back to previous home environment Consults called:  Dr. Keenan Bachelor of ICU Admission status:  SDU/obs      Date of Service 06/25/2019  Ivor Costa [facility]   If 7PM-7AM, please contact night-coverage [email or web address] 06/25/2019, 5:58 PM

## 2019-06-25 NOTE — ED Provider Notes (Signed)
Patient reassessed.  Patient not reporting any progression but also not feel that there is much improvement either.  Posterior oropharynx is without edema.  Does still have some mild edema of the tongue and periorbital edema.  Somewhat odd that she has had multiple episodes of this no clear inciting event.  With recent family member being hospitalized for similar symptoms question whether she has some form of HAE.  There is no evidence of impending airway compromise.  We will continue to observe.   Merlyn Lot, MD 06/25/19 1416

## 2019-06-25 NOTE — ED Notes (Signed)
Pt going to ICU 1 at this time. Pt being transported to by [employer], EDT Janace Hoard

## 2019-06-25 NOTE — Progress Notes (Signed)
PHARMACIST - PHYSICIAN COMMUNICATION  CONCERNING:  Enoxaparin (Lovenox) for DVT Prophylaxis    RECOMMENDATION: Patient was prescribed enoxaprin 40mg  q24 hours for VTE prophylaxis.   Filed Weights   06/25/19 0436  Weight: 235 lb (106.6 kg)    Body mass index is 40.34 kg/m.  Estimated Creatinine Clearance: 109.2 mL/min (by C-G formula based on SCr of 0.66 mg/dL).   Based on [place] patient is candidate for enoxaparin 40mg  every 12 hour dosing due to BMI being >40.  DESCRIPTION: Pharmacy has adjusted enoxaparin dose per [facility] policy.  Patient is now receiving enoxaparin 40mg  every 12 hours.    Slater Mcmanaman, PharmD Clinical Pharmacist  06/25/2019 1:15 PM

## 2019-06-25 NOTE — Consult Note (Addendum)
Name: Kelly Byrd MRN: [id number] DOB: 1975/07/04    ADMISSION DATE:  06/25/2019 CONSULTATION DATE: 06/25/2019  REFERRING MD : Dr. Blaine Hamper  CHIEF COMPLAINT: Allergic Reaction   BRIEF PATIENT DESCRIPTION:  44 yo female admitted with allergic reaction with unknown etiology   SIGNIFICANT EVENTS/STUDIES:  03/29: Pt admitted to the stepdown unit with an allergic reaction   HISTORY OF PRESENT ILLNESS:   This is 44 yo female with a hx of Sickle Cell Trait, Migraines, Kidney Stones, HTN, GERD, Back Pain, and Asthma.  She presented to [facility] ER on 03/29 with c/o generalized itching, tongue swelling, difficulty swallowing, and right eye/facial swelling.  She states she had similar symptoms on 03/23, which was 2 days prior to her beach trip.  She took benadryl and the symptoms resolved on 03/27 while she was at the beach.  She does endorse having a peanut allergy, and also developed angioedema secondary to lisinopril.  She denies taking new medications.  While at the beach she did eat at various restaurants, however 2 days prior to her beach trip when she developed similar symptoms she did not.  She does endorse recent stress and anxiety (her stepson recently died, and her son was murdered 3 yrs ago and the anniversary was recently).  In the ER labs revealed K+ 3.1, glucose 107, calcium 8.7, wbc 12.1, hgb 10.8, and COVID-19 negative.  CT Abd Pelvis negative for acute findings, but revealed small non obstructing left renal calculus and likely fibroid uterus. She received 25 mg of iv benadryl, 125 mg of iv solumedrol, 1,000 mg of tranexamic acid, and 40 meq of potassium.  She was subsequently admitted to the stepdown unit for additional workup and treatment.   PAST MEDICAL HISTORY :   has a past medical history of Asthma, Back pain, GERD (gastroesophageal reflux disease), Hypertension, Kidney stone, Migraines, and Sickle cell trait ([place]).  has a past surgical history that includes Cesarean section. Prior  to Admission medications   Medication Sig Start Date End Date Taking? Authorizing Provider  albuterol (VENTOLIN HFA) 108 (90 Base) MCG/ACT inhaler Inhale 2 puffs into the lungs every 6 (six) hours as needed for wheezing or shortness of breath (wheezing). 03/19/19  Yes Charlott Rakes, MD  amLODipine (NORVASC) 10 MG tablet Take 1 tablet (10 mg total) by mouth daily. 03/19/19  Yes Charlott Rakes, MD  atorvastatin (LIPITOR) 20 MG tablet Take 1 tablet (20 mg total) by mouth daily. 03/19/19  Yes Charlott Rakes, MD  butalbital-acetaminophen-caffeine (FIORICET) 50-325-40 MG tablet Take 1 tablet by mouth every 6 (six) hours as needed for headache. 02/13/19 02/13/20 Yes Laban Emperor, PA-C  gabapentin (NEURONTIN) 300 MG capsule Take 2 capsules (600 mg total) by mouth 2 (two) times daily. 03/19/19  Yes Charlott Rakes, MD  hydrOXYzine (ATARAX/VISTARIL) 50 MG tablet Take 1 tablet (50 mg total) by mouth every 6 (six) hours as needed for anxiety. 10/14/17  Yes Starkes-Perry, Gayland Curry, FNP  meloxicam (MOBIC) 7.5 MG tablet Take 1 tablet (7.5 mg total) by mouth daily. 12/18/18 12/18/19 Yes Paduchowski, Lennette Bihari, MD  montelukast (SINGULAIR) 10 MG tablet Take 1 tablet (10 mg total) by mouth at bedtime. 03/19/19  Yes Charlott Rakes, MD  ondansetron (ZOFRAN ODT) 4 MG disintegrating tablet Take 1 tablet (4 mg total) by mouth every 8 (eight) hours as needed for nausea or vomiting. 12/18/18  Yes Harvest Dark, MD  QUEtiapine (SEROQUEL) 50 MG tablet Take 1 tablet (50 mg total) by mouth at bedtime. Patient taking differently: Take 150 mg by  mouth at bedtime.  10/14/17  Yes Starkes-Perry, Gayland Curry, FNP  sertraline (ZOLOFT) 50 MG tablet Take 1 tablet (50 mg total) by mouth daily. 10/15/17  Yes Starkes-Perry, Gayland Curry, FNP  SUMAtriptan (IMITREX) 100 MG tablet 100 mg orally at the onset of a migraine, may repeat in 2 hours if headache persists or recurs. Max 200mg  in 24 hrs 03/19/19  Yes Newlin, Charlane Ferretti, MD  topiramate (TOPAMAX) 50  MG tablet Take 2 tablets (100 mg total) by mouth 2 (two) times daily. 03/19/19  Yes Charlott Rakes, MD  traZODone (DESYREL) 100 MG tablet Take 1 tablet (100 mg total) by mouth at bedtime. 10/14/17  Yes Starkes-Perry, Gayland Curry, FNP   Allergies  Allergen Reactions  . Ace Inhibitors Swelling  . Lisinopril     Swelling--caused the pt to stay in the hospital  . Peanuts [Peanut Oil]     Swelling--caused the pt to stay in the hospital    FAMILY HISTORY:  family history includes Diabetes Mellitus II in her maternal grandmother; Hypertension in her father and mother; Lupus in an other family member. SOCIAL HISTORY:  reports that she has been smoking cigarettes. She has been smoking about 0.25 packs per day. She has never used smokeless tobacco. She reports current alcohol use of about 1.0 standard drinks of alcohol per week. She reports that she does not use drugs.  REVIEW OF SYSTEMS: Positives in BOLD  Constitutional: Negative for fever, chills, weight loss, malaise/fatigue and diaphoresis.  HENT: right eye and tongue swelling, hearing loss, ear pain, nosebleeds, congestion, sore throat, neck pain, tinnitus and ear discharge.   Eyes: Negative for blurred vision, double vision, photophobia, pain, discharge and redness.  Respiratory: Negative for cough, hemoptysis, sputum production, shortness of breath, wheezing and stridor.   Cardiovascular: Negative for chest pain, palpitations, orthopnea, claudication, leg swelling and PND.  Gastrointestinal: Negative for heartburn, nausea, vomiting, abdominal pain, diarrhea, constipation, blood in stool and melena.  Genitourinary: Negative for dysuria, urgency, frequency, hematuria and flank pain.  Musculoskeletal: Negative for myalgias, back pain, joint pain and falls.  Skin: generalized itching and rash.  Neurological: Negative for dizziness, tingling, tremors, sensory change, speech change, focal weakness, seizures, loss of consciousness, weakness and  headaches.  Endo/Heme/Allergies: Negative for environmental allergies and polydipsia. Does not bruise/bleed easily.  SUBJECTIVE:  c/o generalized itching, but denies difficulty swallowing and shortness of breath   VITAL SIGNS: Temp:  [96.7 F (35.9 C)-98.2 F (36.8 C)] 98.2 F (36.8 C) (03/29 1600) Pulse Rate:  [64-76] 76 (03/29 1600) Resp:  [13-24] 15 (03/29 1600) BP: (128-188)/(68-103) 152/89 (03/29 1600) SpO2:  [95 %-100 %] 97 % (03/29 1600) Weight:  [106.6 kg-109.4 kg] 109.4 kg (03/29 1300)  PHYSICAL EXAMINATION: General: acutely ill appearing female, resting in bed  Neuro: alert and oriented, follows commands HEENT: right periorbital and facial swelling Cardiovascular: nsr with depressed t wave, no R/G  Lungs: faint expiratory wheezes bilateral bases, even, non labored  Abdomen: +BS x4, soft, obese, non distended, non tender  Musculoskeletal: normal bulk and tone Skin: intact no rashes or lesions present   Recent Labs  Lab 06/25/19 0441  NA 137  K 3.1*  CL 103  CO2 25  BUN 9  CREATININE 0.66  GLUCOSE 107*   Recent Labs  Lab 06/25/19 0441  HGB 10.8*  HCT 36.1  WBC 12.1*  PLT 449*   CT ABDOMEN PELVIS WO CONTRAST  Result Date: 06/25/2019 CLINICAL DATA:  Abdominal pain EXAM: CT ABDOMEN AND PELVIS WITHOUT CONTRAST TECHNIQUE:  Multidetector CT imaging of the abdomen and pelvis was performed following the standard protocol without IV contrast. COMPARISON:  12/18/2018 FINDINGS: Lower chest: No acute abnormality. Hepatobiliary: No focal liver abnormality is seen. No gallstones, gallbladder wall thickening, or biliary dilatation. Pancreas: Unremarkable. Spleen: Unremarkable. Adrenals/Urinary Tract: Adrenals are unremarkable. A 2 mm nonobstructing calculus of the lower pole of the left kidney the is again identified. There is no hydronephrosis. Bladder is unremarkable. Stomach/Bowel: Stomach is within normal limits. Bowel is normal in caliber. Normal appendix.  Vascular/Lymphatic: Aortic atherosclerosis. No enlarged abdominal or pelvic lymph nodes. Reproductive: Enlarged with several calcifications probably associated with fibroids. Ovaries are unremarkable by CT. Other: No ascites.  No abdominal wall hernia. 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 Chronic findings at the right hip. IMPRESSION: No new findings. Small nonobstructing left renal calculus. Enlarged, likely fibroid uterus. Electronically Signed   By: Macy Mis M.D.   On: 06/25/2019 13:16    ASSESSMENT / PLAN:  Allergic Reaction with right periorbital/facial and tongue swelling of unknown etiology Prn supplemental O2 for dyspnea and/or hypoxia  Continue iv steroids, iv benadryl, and pepcid Prn hydroxyzine for anxiety Prn epi-pen for anaphylaxis reaction Prn bronchodilator therapy    HTN Continuous telemetry monitoring  Will hold amlodipine for now to determine if this could be contributing to allergic reaction  Prn hydralazine for bp management  Continue outpatient atorvastatin  Pt eating and drinking will stop iv fluids   Hypokalemia  Trend BMP Replace electrolytes as indicated  Monitor UOP Avoid nephrotoxic medications   Chronic Pain  Anxiety/Depression  Recent stressors Continue outpatient trazodone, zoloft, topamax, and seroquel Prn oxycodone for pain management   Marda Stalker, [place] Pager [phone] (please enter 7 digits) PCCM Consult Pager [phone] (please enter 7 digits)

## 2019-06-25 NOTE — Progress Notes (Signed)
1320 Patient admitted from ED via stretcher.Sleepy but able to answer questions without issues.patient stated she was ar [facility] in the pool yesterday when something bit her. She began to itch. She came home yesterday from the beach, went to bed and woke up with a swollen right eye. She came to the ED this morning. Patient is in no acute distress. Respirations are 15. Lungs are clear. MAE without issues. No weakness or abnormalities except a swollen right eye. Swelling extends around entire orbit and into soft tissue around eye.Swelling appears fluid and does not show indention when pressure applied. Patient states pressure produces pain. PEARL. Patient also states she hurts in her abdomin, back , head and legs. She ask for Hydrocodone 2- 5 mg tabs for pain. Patient stated a doctor at [facility] had recently prescribed them for her for leg pain. No information was present on her home medication list concerning the Hydocodone. Dr. Blaine Hamper informed of her pain. Patient also admitted to consuming alcohol yesterday as well. Patient settled in bed.

## 2019-06-25 NOTE — ED Triage Notes (Signed)
Pt to room with allergic reaction  Pt reports itching and swelling started at 8 pm  Pt just home from beach trip, ocean and pool exposure after pool pt reports "biting" feeling  Right eye profound swelling, and left tongue front swelling, pt trembling, reports difficulty breathing  Pt had prior reaction to lisinopril with ICU admission  Pt denies N/V/D

## 2019-06-25 NOTE — ED Provider Notes (Signed)
[facility] Emergency Department Provider Note  ____________________________________________   First MD Initiated Contact with Patient 06/25/19 [phone]     (approximate)  I have reviewed the triage vital signs and the nursing notes.   HISTORY  Chief Complaint Allergic Reaction  Level 5 caveat:  history/ROS limited by acute/critical illness  HPI Kelly Byrd is a 44 y.o. female with medical history as listed below and who has had a prior history of angioedema requiring admission in 2017 while she was on lisinopril.  She presents tonight by private vehicle for acute onset swelling in her face and tongue associated with several hours of gradually worsening generalized itching.  She says that she was at the beach for the last week and was fine until yesterday when she started to feel itchy when she was out in the water.  Today the itching is continued as she drove home.  By the time she got home she started to feel uncomfortable in her abdomen without any specific pain but just "feeling bad".  No nausea nor vomiting nor diarrhea.  She took a hot shower but it did not help.  She went to sleep and when she woke up she had significant swelling and felt like it is difficult to swallow.  She asked her husband to bring her to the ED.  She says that she was put in the ICU the last time this happened to her but she did not need a breathing tube.  It was thought to be due to lisinopril at the time and she no longer takes lisinopril.  She takes amitriptyline but has had no new or different medications recently.  She has no known food allergies and did not come in contact with anything in particular that she think she might be allergic to, she just had the acute onset of itching while she was in the water.  Of note, her mother recently had a similar episode of swelling in the face and tongue and had to be hospitalized and taken to the ICU.         Past Medical History:    Diagnosis Date  . Asthma   . Back pain   . GERD (gastroesophageal reflux disease)   . Hypertension   . Kidney stone   . Migraines   . Sickle cell trait [facility]     Patient Active Problem List   Diagnosis Date Noted  . Major depressive disorder, recurrent severe without psychotic features ([place]) 10/11/2017  . Cocaine abuse ([place]) 10/11/2017  . Lumbar radiculopathy 05/31/2017  . Hyperlipidemia 05/31/2017  . GERD (gastroesophageal reflux disease) 08/05/2016  . Fibroids 09/19/2015  . Hypertension 08/29/2015  . Asthma 08/29/2015  . Back pain 08/29/2015  . 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 Anemia, iron deficiency 10/29/2014  . Menometrorrhagia 10/29/2014  . Migraine 10/27/2014    Past Surgical History:  Procedure Laterality Date  . CESAREAN SECTION     2    Prior to Admission medications   Medication Sig Start Date End Date Taking? Authorizing Provider  acetaminophen-codeine (TYLENOL #3) 300-30 MG tablet Take 1 tablet by mouth every 8 (eight) hours as needed for moderate pain. Dx: chronic left hip pain 03/19/19   Charlott Rakes, MD  albuterol (VENTOLIN HFA) 108 (90 Base) MCG/ACT inhaler Inhale 2 puffs into the lungs every 6 (six) hours as needed for wheezing or shortness of breath (wheezing). 03/19/19   Charlott Rakes, MD  amLODipine (NORVASC) 10 MG tablet Take 1 tablet (10  mg total) by mouth daily. 03/19/19   Charlott Rakes, MD  atorvastatin (LIPITOR) 20 MG tablet Take 1 tablet (20 mg total) by mouth daily. 03/19/19   Charlott Rakes, MD  butalbital-acetaminophen-caffeine (FIORICET) 50-325-40 MG tablet Take 1 tablet by mouth every 6 (six) hours as needed for headache. 02/13/19 02/13/20  Laban Emperor, PA-C  gabapentin (NEURONTIN) 300 MG capsule Take 2 capsules (600 mg total) by mouth 2 (two) times daily. 03/19/19   Charlott Rakes, MD  hydrOXYzine (ATARAX/VISTARIL) 50 MG tablet Take 1 tablet (50 mg total) by mouth every 6 (six) hours as needed for anxiety. 10/14/17    Starkes-Perry, Gayland Curry, FNP  meloxicam (MOBIC) 7.5 MG tablet Take 1 tablet (7.5 mg total) by mouth daily. 12/18/18 12/18/19  Harvest Dark, MD  montelukast (SINGULAIR) 10 MG tablet Take 1 tablet (10 mg total) by mouth at bedtime. 03/19/19   Charlott Rakes, MD  ondansetron (ZOFRAN ODT) 4 MG disintegrating tablet Take 1 tablet (4 mg total) by mouth every 8 (eight) hours as needed for nausea or vomiting. 12/18/18   Harvest Dark, MD  QUEtiapine (SEROQUEL) 50 MG tablet Take 1 tablet (50 mg total) by mouth at bedtime. Patient taking differently: Take 150 mg by mouth at bedtime.  10/14/17   Starkes-Perry, Gayland Curry, FNP  sertraline (ZOLOFT) 50 MG tablet Take 1 tablet (50 mg total) by mouth daily. 10/15/17   Starkes-Perry, Gayland Curry, FNP  SUMAtriptan (IMITREX) 100 MG tablet 100 mg orally at the onset of a migraine, may repeat in 2 hours if headache persists or recurs. Max 200mg  in 24 hrs 03/19/19   Charlott Rakes, MD  topiramate (TOPAMAX) 50 MG tablet Take 2 tablets (100 mg total) by mouth 2 (two) times daily. 03/19/19   Charlott Rakes, MD  traZODone (DESYREL) 100 MG tablet Take 1 tablet (100 mg total) by mouth at bedtime. 10/14/17   Suella Broad, FNP    Allergies Ace inhibitors, Lisinopril, and Peanuts [peanut oil]  Family History  Problem Relation Age of Onset  . Hypertension Mother   . Hypertension Father   . Diabetes Mellitus II Maternal Grandmother   . Lupus Other     Social History Social History   Tobacco Use  . Smoking status: Current Every Day Smoker    Packs/day: 0.25    Types: Cigarettes  . Smokeless tobacco: Never Used  Substance Use Topics  . Alcohol use: Yes    Alcohol/week: 1.0 standard drinks    Types: 1 Cans of beer per week    Comment: 2 x 40oz bud ice  . Drug use: No    Review of Systems Level 5 caveat, see above for details ____________________________________________   PHYSICAL EXAM:  VITAL SIGNS: ED Triage Vitals [06/25/19 0436]  Enc  Vitals Group     BP (!) 188/96     Pulse Rate 68     Resp (!) 24     Temp 98.2 F (36.8 C)     Temp Source Oral     SpO2 100 %     Weight 106.6 kg (235 lb)     Height 1.626 m (5\' 4" )     Head Circumference      Peak Flow      Pain Score 0     Pain Loc      Pain Edu?      Excl. in [place]?     Constitutional: Alert and oriented.  Mild distress. Eyes: Left eye is normal in appearance.  There is profound  periorbital and upper and lower lid edema on the right side which is keeping her right eye shut. Head: Atraumatic. Nose: No congestion/rhinnorhea. Mouth/Throat: The patient has no posterior oral or pharyngeal edema.  She has swelling to the left side of her tongue but it is still easily within her oropharynx.  No trismus.  Her airway is patent at this time.  She is having no difficulty swallowing her secretions.  She is able to speak although her speech is somewhat garbled due to her the swelling in her tongue. Neck: No stridor.  No meningeal signs.   Cardiovascular: Normal rate, regular rhythm. Good peripheral circulation. Grossly normal heart sounds. Respiratory: Normal respiratory effort.  No retractions. Gastrointestinal: Soft and nontender. No distention.  Musculoskeletal: No lower extremity tenderness nor edema. No gross deformities of extremities. Neurologic:  Normal speech and language. No gross focal neurologic deficits are appreciated.  Skin:  Skin is warm, dry and intact. Psychiatric: Mood and affect are normal. Speech and behavior are normal.  ____________________________________________   LABS (all labs ordered are listed, but only abnormal results are displayed)  Labs Reviewed  CBC WITH DIFFERENTIAL/PLATELET  BASIC METABOLIC PANEL   ____________________________________________  EKG  None - EKG not ordered by ED physician ____________________________________________  RADIOLOGY Ursula Alert, personally viewed and evaluated these images (plain radiographs) as  part of my medical decision making, as well as reviewing the written report by the radiologist.  ED MD interpretation: No indication for emergent imaging  Official radiology report(s): No results found.  ____________________________________________   PROCEDURES   Procedure(s) performed (including Critical Care):  .1-3 Lead EKG Interpretation Performed by: Hinda Kehr, MD Authorized by: Hinda Kehr, MD     Interpretation: normal     ECG rate:  71   ECG rate assessment: normal     Rhythm: sinus rhythm     Ectopy: none     Conduction: normal   .Critical Care Performed by: Hinda Kehr, MD Authorized by: Hinda Kehr, MD   Critical care provider statement:    Critical care time (minutes):  30   Critical care time was exclusive of:  Separately billable procedures and treating other patients   Critical care was necessary to treat or prevent imminent or life-threatening deterioration of the following conditions: anaphylaxis/angioedema.   Critical care was time spent personally by me on the following activities:  Development of treatment plan with patient or surrogate, discussions with consultants, evaluation of patient's response to treatment, examination of patient, obtaining history from patient or surrogate, ordering and performing treatments and interventions, ordering and review of laboratory studies, ordering and review of radiographic studies, pulse oximetry, re-evaluation of patient's condition and review of old charts     ____________________________________________   [place] / MDM / [place] / ED COURSE  As part of my medical decision making, I reviewed the following data within the [place] notes reviewed and incorporated, Labs reviewed , Old chart reviewed, Patient signed out to Dr. Quentin Cornwall and Notes from prior ED visits   Differential diagnosis includes, but is not limited to, anaphylaxis due to unknown  allergen, angioedema (including hereditary), less likely acute bacterial or viral infection.  Given the patient's prodrome of generalized itching (in spite of lack of urticaria or other rash), I treated emergently with EpiPen 0.3 mg IM, Solumedrol 125 mg IV, famotidine 20 mg IV, and Benadryl 25 mg IV.  Given the possibility of hereditary angioedema, I also treated with TXA 1000mg  IV.  Will maintain on cardiac monitor for possible arrhythmias (particularly given epi administration) and pulse oximeter.  Labs are WNL other than mild leukocytosis which is likely reactive and was seen during her prior similar episode for which she was admitted.  Infectious cause unlikely, no role for antibiotics.  Airway patent, no indication for emergent airway intervention at this time.  Discussed plan for close observation vs aggressive early intubation with the patient and she agrees with the plan to observe.      Clinical Course as of Jun 24 1601  Mon Jun 25, 2019  0514 Patient no worse, remaining stable.   [CF]  0517 Generally reassuring labs, mild leukocytosis (likely reactive) and mild hypokalemia   [CF]  0608 Patient seems somewhat improved, is sleeping comfortably with no airway compromise at this time and lying supine.  Swelling around the right eye seems to have improved somewhat.  I did not wake her up to assess her tongue since she is breathing comfortably currently.   [CF]  0608 The patient has shown some improvement but not a substantial amount of improvement.  However she is breathing comfortably and tolerating her secretions without any difficulty.  She still has a great deal of swelling on the right side of her face near her eye but her tongue swelling has gone down slightly.  She has been sleeping comfortably and I woke her up to ask her how she is feeling.  She says she feels little bit better but her speech is still somewhat garbled due to the residual tongue swelling.  We agreed to observe her for  another couple of hours and then reassess to see how she is doing at that time and whether or not she would benefit from admission for continued observation or whether she will feel comfortable with discharge.  I have discussed the case in person with Dr. Quentin Cornwall in the emergency department who will assume her care.   [CF]    Clinical Course User Index [CF] Hinda Kehr, MD     ____________________________________________  FINAL CLINICAL IMPRESSION(S) / ED DIAGNOSES  Final diagnoses:  Anaphylaxis, initial encounter  Angioedema, initial encounter     MEDICATIONS GIVEN DURING THIS VISIT:  Medications  famotidine (PEPCID) IVPB 20 mg premix (20 mg Intravenous New Bag/Given 06/25/19 0435)  tranexamic acid (CYKLOKAPRON) IVPB 1,000 mg (has no administration in time range)  EPINEPHrine (EPI-PEN) injection 0.3 mg (0.3 mg Intramuscular Given 06/25/19 0432)  methylPREDNISolone sodium succinate (SOLU-MEDROL) 125 mg/2 mL injection 125 mg (125 mg Intravenous Given 06/25/19 0433)  diphenhydrAMINE (BENADRYL) injection 25 mg (25 mg Intravenous Given 06/25/19 0434)     ED Discharge Orders    None      *Please note:  Kelly Byrd was evaluated in Emergency Department on 06/25/2019 for the symptoms described in the history of present illness. She was evaluated in the context of the global COVID-19 pandemic, which necessitated consideration that the patient might be at risk for infection with the SARS-CoV-2 virus that causes COVID-19. Institutional protocols and algorithms that pertain to the evaluation of patients at risk for COVID-19 are in a state of rapid change based on information released by regulatory bodies including the CDC and federal and state organizations. These policies and algorithms were followed during the patient's care in the ED.  Some ED evaluations and interventions may be delayed as a result of limited staffing during the pandemic.*  Note:  This document was prepared using  Dragon voice recognition software and may include unintentional  dictation errors.   Hinda Kehr, MD 06/25/19 [phone]

## 2019-06-25 NOTE — ED Notes (Signed)
Called ICU and informed that I was on way up with pt after we stop by CT for her scan. ICU states understanding.

## 2019-06-25 NOTE — ED Notes (Signed)
Pt informed of needing urine sample to rule out pregnancy. Pt states she is not pregnant and unable to provide specimen.

## 2019-06-26 DIAGNOSIS — M5416 Radiculopathy, lumbar region: Secondary | ICD-10-CM | POA: Diagnosis present

## 2019-06-26 DIAGNOSIS — E785 Hyperlipidemia, unspecified: Secondary | ICD-10-CM | POA: Diagnosis not present

## 2019-06-26 DIAGNOSIS — Z634 Disappearance and death of family member: Secondary | ICD-10-CM | POA: Diagnosis not present

## 2019-06-26 DIAGNOSIS — D509 Iron deficiency anemia, unspecified: Secondary | ICD-10-CM | POA: Diagnosis not present

## 2019-06-26 DIAGNOSIS — R1084 Generalized abdominal pain: Secondary | ICD-10-CM | POA: Diagnosis not present

## 2019-06-26 DIAGNOSIS — G43909 Migraine, unspecified, not intractable, without status migrainosus: Secondary | ICD-10-CM | POA: Diagnosis present

## 2019-06-26 DIAGNOSIS — I10 Essential (primary) hypertension: Secondary | ICD-10-CM | POA: Diagnosis present

## 2019-06-26 DIAGNOSIS — T7840XA Allergy, unspecified, initial encounter: Secondary | ICD-10-CM | POA: Diagnosis not present

## 2019-06-26 DIAGNOSIS — Z98891 History of uterine scar from previous surgery: Secondary | ICD-10-CM | POA: Diagnosis not present

## 2019-06-26 DIAGNOSIS — F17210 Nicotine dependence, cigarettes, uncomplicated: Secondary | ICD-10-CM | POA: Diagnosis present

## 2019-06-26 DIAGNOSIS — T783XXA Angioneurotic edema, initial encounter: Secondary | ICD-10-CM | POA: Diagnosis present

## 2019-06-26 DIAGNOSIS — L299 Pruritus, unspecified: Secondary | ICD-10-CM | POA: Diagnosis present

## 2019-06-26 DIAGNOSIS — R220 Localized swelling, mass and lump, head: Secondary | ICD-10-CM

## 2019-06-26 DIAGNOSIS — M25552 Pain in left hip: Secondary | ICD-10-CM | POA: Diagnosis present

## 2019-06-26 DIAGNOSIS — E876 Hypokalemia: Secondary | ICD-10-CM | POA: Diagnosis not present

## 2019-06-26 DIAGNOSIS — Z8249 Family history of ischemic heart disease and other diseases of the circulatory system: Secondary | ICD-10-CM | POA: Diagnosis not present

## 2019-06-26 DIAGNOSIS — F419 Anxiety disorder, unspecified: Secondary | ICD-10-CM | POA: Diagnosis present

## 2019-06-26 DIAGNOSIS — J45909 Unspecified asthma, uncomplicated: Secondary | ICD-10-CM | POA: Diagnosis present

## 2019-06-26 DIAGNOSIS — T783XXS Angioneurotic edema, sequela: Secondary | ICD-10-CM

## 2019-06-26 DIAGNOSIS — G8929 Other chronic pain: Secondary | ICD-10-CM | POA: Diagnosis present

## 2019-06-26 DIAGNOSIS — Z20822 Contact with and (suspected) exposure to covid-19: Secondary | ICD-10-CM | POA: Diagnosis present

## 2019-06-26 DIAGNOSIS — D259 Leiomyoma of uterus, unspecified: Secondary | ICD-10-CM | POA: Diagnosis present

## 2019-06-26 DIAGNOSIS — Z972 Presence of dental prosthetic device (complete) (partial): Secondary | ICD-10-CM | POA: Diagnosis not present

## 2019-06-26 DIAGNOSIS — Z87442 Personal history of urinary calculi: Secondary | ICD-10-CM | POA: Diagnosis not present

## 2019-06-26 DIAGNOSIS — F332 Major depressive disorder, recurrent severe without psychotic features: Secondary | ICD-10-CM | POA: Diagnosis present

## 2019-06-26 DIAGNOSIS — N200 Calculus of kidney: Secondary | ICD-10-CM | POA: Diagnosis present

## 2019-06-26 DIAGNOSIS — D573 Sickle-cell trait: Secondary | ICD-10-CM | POA: Diagnosis present

## 2019-06-26 DIAGNOSIS — X58XXXA Exposure to other specified factors, initial encounter: Secondary | ICD-10-CM | POA: Diagnosis present

## 2019-06-26 DIAGNOSIS — R8271 Bacteriuria: Secondary | ICD-10-CM | POA: Diagnosis present

## 2019-06-26 LAB — PHOSPHORUS: Phosphorus: 2.4 mg/dL — ABNORMAL LOW (ref 2.5–4.6)

## 2019-06-26 LAB — URINALYSIS, COMPLETE (UACMP) WITH MICROSCOPIC
Bilirubin Urine: NEGATIVE
Glucose, UA: NEGATIVE mg/dL
Hgb urine dipstick: NEGATIVE
Ketones, ur: NEGATIVE mg/dL
Nitrite: NEGATIVE
Protein, ur: NEGATIVE mg/dL
Specific Gravity, Urine: 1.013 (ref 1.005–1.030)
pH: 7 (ref 5.0–8.0)

## 2019-06-26 LAB — CBC
HCT: 31.1 % — ABNORMAL LOW (ref 36.0–46.0)
Hemoglobin: 9.3 g/dL — ABNORMAL LOW (ref 12.0–15.0)
MCH: 20.5 pg — ABNORMAL LOW (ref 26.0–34.0)
MCHC: 29.9 g/dL — ABNORMAL LOW (ref 30.0–36.0)
MCV: 68.7 fL — ABNORMAL LOW (ref 80.0–100.0)
Platelets: 388 10*3/uL (ref 150–400)
RBC: 4.53 MIL/uL (ref 3.87–5.11)
RDW: 18.4 % — ABNORMAL HIGH (ref 11.5–15.5)
WBC: 11.6 10*3/uL — ABNORMAL HIGH (ref 4.0–10.5)
nRBC: 0 % (ref 0.0–0.2)

## 2019-06-26 LAB — PREGNANCY, URINE: Preg Test, Ur: NEGATIVE

## 2019-06-26 LAB — BASIC METABOLIC PANEL
Anion gap: 8 (ref 5–15)
BUN: 9 mg/dL (ref 6–20)
CO2: 23 mmol/L (ref 22–32)
Calcium: 8.8 mg/dL — ABNORMAL LOW (ref 8.9–10.3)
Chloride: 109 mmol/L (ref 98–111)
Creatinine, Ser: 0.54 mg/dL (ref 0.44–1.00)
GFR calc Af Amer: >60 mL/min (ref >60)
GFR calc non Af Amer: >60 mL/min (ref >60)
Glucose, Bld: 143 mg/dL — ABNORMAL HIGH (ref 70–99)
Potassium: 3.7 mmol/L (ref 3.5–5.1)
Sodium: 140 mmol/L (ref 135–145)

## 2019-06-26 LAB — MAGNESIUM: Magnesium: 2.2 mg/dL (ref 1.7–2.4)

## 2019-06-26 LAB — C4 COMPLEMENT: Complement C4, Body Fluid: 31 mg/dL (ref 12–38)

## 2019-06-26 LAB — C3 COMPLEMENT: C3 Complement: 128 mg/dL (ref 82–167)

## 2019-06-26 LAB — HIV ANTIBODY (ROUTINE TESTING W REFLEX): HIV Screen 4th Generation wRfx: NONREACTIVE

## 2019-06-26 LAB — PROCALCITONIN: Procalcitonin: <0.1 ng/mL

## 2019-06-26 MED ORDER — MORPHINE SULFATE (PF) 2 MG/ML IV SOLN
1.0000 mg | Freq: Once | INTRAVENOUS | Status: AC
  Administered 2019-06-26: 1 mg via INTRAVENOUS
  Filled 2019-06-26: qty 1

## 2019-06-26 MED ORDER — SODIUM CHLORIDE 0.9 % IV SOLN
1.0000 g | INTRAVENOUS | Status: DC
  Administered 2019-06-26: 1 g via INTRAVENOUS
  Filled 2019-06-26: qty 1
  Filled 2019-06-26: qty 10

## 2019-06-26 MED ORDER — LORATADINE 10 MG PO TABS
10.0000 mg | ORAL_TABLET | Freq: Every day | ORAL | Status: DC
  Administered 2019-06-26 – 2019-06-27 (×2): 10 mg via ORAL
  Filled 2019-06-26 (×2): qty 1

## 2019-06-26 NOTE — Progress Notes (Signed)
Patient ID: Kelly Byrd, female   DOB: 07/03/1975, 44 y.o.   MRN: [id number] [facility] PROGRESS NOTE  Kelly Byrd [id number] DOB: December 08, 1975 DOA: 06/25/2019 PCP: Charlott Rakes, MD  HPI/Subjective: Patient states that she has had repeated episodes of facial swelling and tongue swelling.  She states that she is not taking any ACE inhibitor or ARB.  She wants to figure out what is happening.  No complaints of trouble breathing.  No trouble swallowing.  Objective: Vitals:   06/26/19 0802 06/26/19 0900  BP: 123/74 124/70  Pulse: 74 78  Resp: 16 16  Temp:    SpO2: 98% 99%    Intake/Output Summary (Last 24 hours) at 06/26/2019 1341 Last data filed at 06/26/2019 0800 Gross per 24 hour  Intake 240 ml  Output 400 ml  Net -160 ml   Filed Weights   06/25/19 0436 06/25/19 1300  Weight: 106.6 kg 109.4 kg    ROS: Review of Systems  Constitutional: Negative for chills and fever.  Eyes: Negative for blurred vision.  Respiratory: Negative for cough and shortness of breath.   Cardiovascular: Negative for chest pain.  Gastrointestinal: Negative for abdominal pain, constipation, diarrhea, nausea and vomiting.  Genitourinary: Negative for dysuria.  Musculoskeletal: Negative for joint pain.  Neurological: Negative for dizziness and headaches.   Exam: Physical Exam  Constitutional: She is oriented to person, place, and time.  HENT:  Nose: No mucosal edema.  Mouth/Throat: No oropharyngeal exudate or posterior oropharyngeal edema.  Eyes: Pupils are equal, round, and reactive to light. Conjunctivae and EOM are normal.  Right eyelid swollen  Neck: Carotid bruit is not present.  Cardiovascular: S1 normal and S2 normal. Exam reveals no gallop.  No murmur heard. Respiratory: No respiratory distress. She has decreased breath sounds in the right lower field and the left lower field. She has no wheezes. She has no rhonchi. She has no rales.  GI: Soft. Bowel sounds are normal.  There is no abdominal tenderness.  Musculoskeletal:     Right ankle: No swelling.     Left ankle: No swelling.  Lymphadenopathy:    She has no cervical adenopathy.  Neurological: She is alert and oriented to person, place, and time. No cranial nerve deficit.  Skin: Skin is warm. No rash noted. Nails show no clubbing.  Right sided facial swelling  Psychiatric: She has a normal mood and affect.      Data Reviewed: Basic Metabolic Panel: Recent Labs  Lab 06/25/19 0441 06/25/19 1938 06/26/19 0356  NA 137  --  140  K 3.1* 4.0 3.7  CL 103  --  109  CO2 25  --  23  GLUCOSE 107*  --  143*  BUN 9  --  9  CREATININE 0.66  --  0.54  CALCIUM 8.7*  --  8.8*  MG  --  1.9 2.2  PHOS  --   --  2.4*    Recent Labs  Lab 06/25/19 0441  LIPASE 44   CBC: Recent Labs  Lab 06/25/19 0441 06/26/19 0356  WBC 12.1* 11.6*  NEUTROABS 7.5  --   HGB 10.8* 9.3*  HCT 36.1 31.1*  MCV 68.5* 68.7*  PLT 449* 388    CBG: Recent Labs  Lab 06/25/19 1310  GLUCAP 158*    Recent Results (from the past 240 hour(s))  SARS CORONAVIRUS 2 (TAT 6-24 HRS) Nasopharyngeal Nasopharyngeal Swab     Status: None   Collection Time: 06/25/19 10:15 AM   Specimen: Nasopharyngeal Swab  Result  Value Ref Range Status   SARS Coronavirus 2 NEGATIVE NEGATIVE Final    Comment: (NOTE) SARS-CoV-2 target nucleic acids are NOT DETECTED. The SARS-CoV-2 RNA is generally detectable in upper and lower respiratory specimens during the acute phase of infection. Negative results do not preclude SARS-CoV-2 infection, do not rule out co-infections with other pathogens, and should not be used as the sole basis for treatment or other patient management decisions. Negative results must be combined with clinical observations, patient history, and epidemiological information. The expected result is Negative. Fact Sheet for Patients: SugarRoll.be Fact Sheet for Healthcare  Providers: https://www.woods-mathews.com/ This test is not yet approved or cleared by the [place] FDA and  has been authorized for detection and/or diagnosis of SARS-CoV-2 by FDA under an Emergency Use Authorization (EUA). This EUA will remain  in effect (meaning this test can be used) for the duration of the COVID-19 declaration under Section 56 4(b)(1) of the Act, 21 U.S.C. section 360bbb-3(b)(1), unless the authorization is terminated or revoked sooner. Performed at [facility], [place] [address] [place], [place] [address]   MRSA PCR Screening     Status: Abnormal   Collection Time: 06/25/19  1:34 PM   Specimen: Nasopharyngeal  Result Value Ref Range Status   MRSA by PCR POSITIVE (A) NEGATIVE Final    Comment:        The GeneXpert MRSA Assay (FDA approved for NASAL specimens only), is one component of a comprehensive MRSA colonization surveillance program. It is not intended to diagnose MRSA infection nor to guide or monitor treatment for MRSA infections. RESULT CALLED TO, READ BACK BY AND VERIFIED WITH: Adalberto Ill RN AT [id number] ON 06/25/19 SNG  Performed at [facility], [address] [place], [place] [address]      Studies: CT ABDOMEN PELVIS WO CONTRAST  Result Date: 06/25/2019 CLINICAL DATA:  Abdominal pain EXAM: CT ABDOMEN AND PELVIS WITHOUT CONTRAST TECHNIQUE: Multidetector CT imaging of the abdomen and pelvis was performed following the standard protocol without IV contrast. COMPARISON:  12/18/2018 FINDINGS: Lower chest: No acute abnormality. Hepatobiliary: No focal liver abnormality is seen. No gallstones, gallbladder wall thickening, or biliary dilatation. Pancreas: Unremarkable. Spleen: Unremarkable. Adrenals/Urinary Tract: Adrenals are unremarkable. A 2 mm nonobstructing calculus of the lower pole of the left kidney the is again identified. There is no hydronephrosis. Bladder is unremarkable. Stomach/Bowel: Stomach is within normal  limits. Bowel is normal in caliber. Normal appendix. Vascular/Lymphatic: Aortic atherosclerosis. No enlarged abdominal or pelvic lymph nodes. Reproductive: Enlarged with several calcifications probably associated with fibroids. Ovaries are unremarkable by CT. Other: No ascites.  No abdominal wall hernia. 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 Chronic findings at the right hip. IMPRESSION: No new findings. Small nonobstructing left renal calculus. Enlarged, likely fibroid uterus. Electronically Signed   By: Macy Mis M.D.   On: 06/25/2019 13:16    Scheduled Meds: . atorvastatin  20 mg Oral Daily  . Chlorhexidine Gluconate Cloth  6 each Topical Daily  . diphenhydrAMINE  25 mg Intravenous Q6H  . enoxaparin (LOVENOX) injection  40 mg Subcutaneous Q12H  . famotidine  20 mg Oral BID  . feeding supplement (GLUCERNA SHAKE)  237 mL Oral BID BM  . methylPREDNISolone (SOLU-MEDROL) injection  60 mg Intravenous Q12H  . montelukast  10 mg Oral QHS  . nicotine  21 mg Transdermal Daily  . QUEtiapine  150 mg Oral QHS  . sertraline  50 mg Oral Daily  . topiramate  100 mg  Oral BID  . traZODone  100 mg Oral QHS    Assessment/Plan:  1. Right facial swelling with angioedema.  I recommended getting to an allergist Dr. Orvil Feil as outpatient.  I recommended Allegra during the day and Zyrtec at night.  Patient will need an EpiPen upon going home.  She does not appear to be taking an ACE inhibitor or ARB.  I sent off a C1 esterase inhibitor.  Patient can get out of the ICU.  Continue Solu-Medrol, famotidine and as needed Benadryl. 2. Hyperlipidemia on atorvastatin 3. Hypokalemia on presentation which was replaced 4. Asthma on Singulair 5. Migraine and depression continue Topamax Seroquel and Zoloft 6. Iron deficiency anemia.  Check hemoglobin again tomorrow  Code Status:     Code Status Orders  (From admission, onward)         Start     Ordered   06/25/19 1248  Full code   Continuous     06/25/19 1247        Code Status History    Date Active Date Inactive Code Status Order ID Comments User Context   10/11/2017 1739 10/14/2017 2036 Full Code [id number]  Patrecia Pour, NP Inpatient   10/10/2017 2212 10/11/2017 1643 Full Code [id number]  Duffy Bruce, MD ED   04/23/2017 2056 04/24/2017 1752 Full Code [id number]  Lavonia Drafts, MD ED   06/15/2015 0223 06/16/2015 1754 Full Code [id number]  Saundra Shelling, MD Inpatient   10/27/2014 0247 10/29/2014 1640 Full Code [id number]  Harrie Foreman, MD Inpatient   Advance Care Planning Activity     Disposition Plan: Likely can go home tomorrow if facial swelling better.  Transfer out of ICU stepdown unit today.  Consultants:  Critical care specialist.  Time spent: 28 minutes  [place]

## 2019-06-26 NOTE — Progress Notes (Signed)
Name: Kelly Byrd MRN: [id number] DOB: Jan 19, 1976    ADMISSION DATE:  06/25/2019 CONSULTATION DATE: 06/25/2019  REFERRING MD : Dr. Blaine Hamper  CHIEF COMPLAINT: Allergic Reaction   BRIEF PATIENT DESCRIPTION:  44 yo female admitted with allergic reaction with unknown etiology   SIGNIFICANT EVENTS/STUDIES:  03/29: Pt admitted to the stepdown unit with an allergic reaction   HISTORY OF PRESENT ILLNESS:   This is 44 yo female with a hx of Sickle Cell Trait, Migraines, Kidney Stones, HTN, GERD, Back Pain, and Asthma.  She presented to [facility] ER on 03/29 with c/o generalized itching, tongue swelling, difficulty swallowing, and right eye/facial swelling.  She states she had similar symptoms on 03/23, which was 2 days prior to her beach trip.  She took benadryl and the symptoms resolved on 03/27 while she was at the beach.  She does endorse having a peanut allergy, and also developed angioedema secondary to lisinopril.  She denies taking new medications.  While at the beach she did eat at various restaurants, however 2 days prior to her beach trip when she developed similar symptoms she did not.  She does endorse recent stress and anxiety (her stepson recently died, and her son was murdered 3 yrs ago and the anniversary was recently).  In the ER labs revealed K+ 3.1, glucose 107, calcium 8.7, wbc 12.1, hgb 10.8, and COVID-19 negative.  CT Abd Pelvis negative for acute findings, but revealed small non obstructing left renal calculus and likely fibroid uterus. She received 25 mg of iv benadryl, 125 mg of iv solumedrol, 1,000 mg of tranexamic acid, and 40 meq of potassium.  She was subsequently admitted to the stepdown unit for additional workup and treatment.   PAST MEDICAL HISTORY :   has a past medical history of Asthma, Back pain, GERD (gastroesophageal reflux disease), Hypertension, Kidney stone, Migraines, and Sickle cell trait ([place]).  has a past surgical history that includes Cesarean section. Prior  to Admission medications   Medication Sig Start Date End Date Taking? Authorizing Provider  albuterol (VENTOLIN HFA) 108 (90 Base) MCG/ACT inhaler Inhale 2 puffs into the lungs every 6 (six) hours as needed for wheezing or shortness of breath (wheezing). 03/19/19  Yes Charlott Rakes, MD  amLODipine (NORVASC) 10 MG tablet Take 1 tablet (10 mg total) by mouth daily. 03/19/19  Yes Charlott Rakes, MD  atorvastatin (LIPITOR) 20 MG tablet Take 1 tablet (20 mg total) by mouth daily. 03/19/19  Yes Charlott Rakes, MD  butalbital-acetaminophen-caffeine (FIORICET) 50-325-40 MG tablet Take 1 tablet by mouth every 6 (six) hours as needed for headache. 02/13/19 02/13/20 Yes Laban Emperor, PA-C  gabapentin (NEURONTIN) 300 MG capsule Take 2 capsules (600 mg total) by mouth 2 (two) times daily. 03/19/19  Yes Charlott Rakes, MD  hydrOXYzine (ATARAX/VISTARIL) 50 MG tablet Take 1 tablet (50 mg total) by mouth every 6 (six) hours as needed for anxiety. 10/14/17  Yes Starkes-Perry, Gayland Curry, FNP  meloxicam (MOBIC) 7.5 MG tablet Take 1 tablet (7.5 mg total) by mouth daily. 12/18/18 12/18/19 Yes Paduchowski, Lennette Bihari, MD  montelukast (SINGULAIR) 10 MG tablet Take 1 tablet (10 mg total) by mouth at bedtime. 03/19/19  Yes Charlott Rakes, MD  ondansetron (ZOFRAN ODT) 4 MG disintegrating tablet Take 1 tablet (4 mg total) by mouth every 8 (eight) hours as needed for nausea or vomiting. 12/18/18  Yes Harvest Dark, MD  QUEtiapine (SEROQUEL) 50 MG tablet Take 1 tablet (50 mg total) by mouth at bedtime. Patient taking differently: Take 150 mg by  mouth at bedtime.  10/14/17  Yes Starkes-Perry, Gayland Curry, FNP  sertraline (ZOLOFT) 50 MG tablet Take 1 tablet (50 mg total) by mouth daily. 10/15/17  Yes Starkes-Perry, Gayland Curry, FNP  SUMAtriptan (IMITREX) 100 MG tablet 100 mg orally at the onset of a migraine, may repeat in 2 hours if headache persists or recurs. Max 200mg  in 24 hrs 03/19/19  Yes Newlin, Charlane Ferretti, MD  topiramate (TOPAMAX) 50  MG tablet Take 2 tablets (100 mg total) by mouth 2 (two) times daily. 03/19/19  Yes Charlott Rakes, MD  traZODone (DESYREL) 100 MG tablet Take 1 tablet (100 mg total) by mouth at bedtime. 10/14/17  Yes Starkes-Perry, Gayland Curry, FNP   Allergies  Allergen Reactions  . Ace Inhibitors Swelling  . Lisinopril     Swelling--caused the pt to stay in the hospital  . Peanuts [Peanut Oil]     Swelling--caused the pt to stay in the hospital    FAMILY HISTORY:  family history includes Diabetes Mellitus II in her maternal grandmother; Hypertension in her father and mother; Lupus in an other family member. SOCIAL HISTORY:  reports that she has been smoking cigarettes. She has been smoking about 0.25 packs per day. She has never used smokeless tobacco. She reports current alcohol use of about 1.0 standard drinks of alcohol per week. She reports that she does not use drugs.  REVIEW OF SYSTEMS: Positives in BOLD  Constitutional: Negative for fever, chills, weight loss, malaise/fatigue and diaphoresis.  HENT: right eye and tongue swelling, hearing loss, ear pain, nosebleeds, congestion, sore throat, neck pain, tinnitus and ear discharge.   Eyes: Negative for blurred vision, double vision, photophobia, pain, discharge and redness.  Respiratory: Negative for cough, hemoptysis, sputum production, shortness of breath, wheezing and stridor.   Cardiovascular: Negative for chest pain, palpitations, orthopnea, claudication, leg swelling and PND.  Gastrointestinal: Negative for heartburn, nausea, vomiting, abdominal pain, diarrhea, constipation, blood in stool and melena.  Genitourinary: Negative for dysuria, urgency, frequency, hematuria and flank pain.  Musculoskeletal: Negative for myalgias, back pain, joint pain and falls.  Skin: generalized itching and rash.  Neurological: Negative for dizziness, tingling, tremors, sensory change, speech change, focal weakness, seizures, loss of consciousness, weakness and  headaches.  Endo/Heme/Allergies: Negative for environmental allergies and polydipsia. Does not bruise/bleed easily.  SUBJECTIVE:  c/o generalized itching, but denies difficulty swallowing and shortness of breath   VITAL SIGNS: Temp:  [96.7 F (35.9 C)-99 F (37.2 C)] 98.8 F (37.1 C) (03/30 0400) Pulse Rate:  [64-98] 77 (03/30 0600) Resp:  [13-21] 17 (03/30 0600) BP: (122-169)/(64-97) 122/64 (03/30 0100) SpO2:  [95 %-100 %] 97 % (03/30 0600) Weight:  [109.4 kg] 109.4 kg (03/29 1300)  PHYSICAL EXAMINATION: General: acutely ill appearing female, resting in bed  Neuro: alert and oriented, follows commands HEENT: right periorbital and facial swelling Cardiovascular: nsr with depressed t wave, no R/G  Lungs: faint expiratory wheezes bilateral bases, even, non labored  Abdomen: +BS x4, soft, obese, non distended, non tender  Musculoskeletal: normal bulk and tone Skin: intact no rashes or lesions present   Recent Labs  Lab 06/25/19 0441 06/25/19 1938 06/26/19 0356  NA 137  --  140  K 3.1* 4.0 3.7  CL 103  --  109  CO2 25  --  23  BUN 9  --  9  CREATININE 0.66  --  0.54  GLUCOSE 107*  --  143*   Recent Labs  Lab 06/25/19 0441 06/26/19 0356  HGB 10.8* 9.3*  HCT 36.1 31.1*  WBC 12.1* 11.6*  PLT 449* 388   CT ABDOMEN PELVIS WO CONTRAST  Result Date: 06/25/2019 CLINICAL DATA:  Abdominal pain EXAM: CT ABDOMEN AND PELVIS WITHOUT CONTRAST TECHNIQUE: Multidetector CT imaging of the abdomen and pelvis was performed following the standard protocol without IV contrast. COMPARISON:  12/18/2018 FINDINGS: Lower chest: No acute abnormality. Hepatobiliary: No focal liver abnormality is seen. No gallstones, gallbladder wall thickening, or biliary dilatation. Pancreas: Unremarkable. Spleen: Unremarkable. Adrenals/Urinary Tract: Adrenals are unremarkable. A 2 mm nonobstructing calculus of the lower pole of the left kidney the is again identified. There is no hydronephrosis. Bladder is  unremarkable. Stomach/Bowel: Stomach is within normal limits. Bowel is normal in caliber. Normal appendix. Vascular/Lymphatic: Aortic atherosclerosis. No enlarged abdominal or pelvic lymph nodes. Reproductive: Enlarged with several calcifications probably associated with fibroids. Ovaries are unremarkable by CT. Other: No ascites.  No abdominal wall hernia. 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 Chronic findings at the right hip. IMPRESSION: No new findings. Small nonobstructing left renal calculus. Enlarged, likely fibroid uterus. Electronically Signed   By: Macy Mis M.D.   On: 06/25/2019 13:16    ASSESSMENT / PLAN:  Allergic Reaction with right periorbital/facial and tongue swelling of unknown etiology Prn supplemental O2 for dyspnea and/or hypoxia  Continue iv steroids, iv benadryl, and pepcid Prn hydroxyzine for anxiety Prn epi-pen for anaphylaxis reaction Prn bronchodilator therapy    HTN Continuous telemetry monitoring  Will hold amlodipine for now to determine if this could be contributing to allergic reaction  Prn hydralazine for bp management  Continue outpatient atorvastatin  Pt eating and drinking will stop iv fluids   Hypokalemia  Trend BMP Replace electrolytes as indicated  Monitor UOP Avoid nephrotoxic medications   Chronic Pain  Anxiety/Depression  Recent stressors Continue outpatient trazodone, zoloft, topamax, and seroquel Prn oxycodone for pain management    Ottie Glazier, M.D.  [facility]

## 2019-06-27 DIAGNOSIS — J4520 Mild intermittent asthma, uncomplicated: Secondary | ICD-10-CM

## 2019-06-27 LAB — PROCALCITONIN: Procalcitonin: <0.1 ng/mL

## 2019-06-27 LAB — C1 ESTERASE INHIBITOR
C1INH SerPl-mCnc: 33 mg/dL (ref 21–39)
C1INH SerPl-mCnc: 33 mg/dL (ref 21–39)

## 2019-06-27 MED ORDER — PREDNISONE 10 MG PO TABS: ORAL_TABLET | ORAL | 0 refills | Status: AC

## 2019-06-27 MED ORDER — FAMOTIDINE 20 MG PO TABS: 20.0000 mg | ORAL_TABLET | Freq: Two times a day (BID) | ORAL | 0 refills | Status: DC

## 2019-06-27 MED ORDER — DIPHENHYDRAMINE HCL 50 MG PO TABS: 50.0000 mg | ORAL_TABLET | Freq: Three times a day (TID) | ORAL | 0 refills | Status: DC | PRN

## 2019-06-27 MED ORDER — EPINEPHRINE 0.3 MG/0.3ML IJ SOAJ: 0.3000 mg | INTRAMUSCULAR | 0 refills | Status: DC | PRN

## 2019-06-27 NOTE — Progress Notes (Signed)
Zahrah Mooneyhan A and O x4. VSS. Pt tolerating diet well. No complaints of nausea or vomiting. IV removed intact, prescriptions given. Pt voices understanding of discharge instructions with no further questions. Patient discharged via wheelchair with RN  Allergies as of 06/27/2019      Reactions   Ace Inhibitors Swelling   Lisinopril    Swelling--caused the pt to stay in the hospital   Peanuts [peanut Oil]    Swelling--caused the pt to stay in the hospital      Medication List    TAKE these medications   albuterol 108 (90 Base) MCG/ACT inhaler Commonly known as: VENTOLIN HFA Inhale 2 puffs into the lungs every 6 (six) hours as needed for wheezing or shortness of breath (wheezing).   amLODipine 10 MG tablet Commonly known as: NORVASC Take 1 tablet (10 mg total) by mouth daily.   atorvastatin 20 MG tablet Commonly known as: LIPITOR Take 1 tablet (20 mg total) by mouth daily.   butalbital-acetaminophen-caffeine 50-325-40 MG tablet Commonly known as: FIORICET Take 1 tablet by mouth every 6 (six) hours as needed for headache.   diphenhydrAMINE 50 MG tablet Commonly known as: BENADRYL Take 1 tablet (50 mg total) by mouth every 8 (eight) hours as needed for allergies.   EPINEPHrine 0.3 mg/0.3 mL Soaj injection Commonly known as: EPI-PEN Inject 0.3 mLs (0.3 mg total) into the muscle as needed for anaphylaxis.   famotidine 20 MG tablet Commonly known as: PEPCID Take 1 tablet (20 mg total) by mouth 2 (two) times daily.   gabapentin 300 MG capsule Commonly known as: NEURONTIN Take 2 capsules (600 mg total) by mouth 2 (two) times daily.   hydrOXYzine 50 MG tablet Commonly known as: ATARAX/VISTARIL Take 1 tablet (50 mg total) by mouth every 6 (six) hours as needed for anxiety.   meloxicam 7.5 MG tablet Commonly known as: Mobic Take 1 tablet (7.5 mg total) by mouth daily.   montelukast 10 MG tablet Commonly known as: SINGULAIR Take 1 tablet (10 mg total) by mouth at  bedtime.   ondansetron 4 MG disintegrating tablet Commonly known as: Zofran ODT Take 1 tablet (4 mg total) by mouth every 8 (eight) hours as needed for nausea or vomiting.   predniSONE 10 MG tablet Commonly known as: DELTASONE Take 5 tablets (50 mg total) by mouth daily with breakfast for 1 day, THEN 4 tablets (40 mg total) daily with breakfast for 1 day, THEN 3 tablets (30 mg total) daily with breakfast for 1 day, THEN 2 tablets (20 mg total) daily with breakfast for 1 day, THEN 1 tablet (10 mg total) daily with breakfast for 1 day. Start taking on: June 27, 2019   QUEtiapine 50 MG tablet Commonly known as: SEROQUEL Take 1 tablet (50 mg total) by mouth at bedtime. What changed: how much to take   sertraline 50 MG tablet Commonly known as: ZOLOFT Take 1 tablet (50 mg total) by mouth daily.   SUMAtriptan 100 MG tablet Commonly known as: Imitrex 100 mg orally at the onset of a migraine, may repeat in 2 hours if headache persists or recurs. Max 200mg  in 24 hrs   topiramate 50 MG tablet Commonly known as: Topamax Take 2 tablets (100 mg total) by mouth 2 (two) times daily.   traZODone 100 MG tablet Commonly known as: DESYREL Take 1 tablet (100 mg total) by mouth at bedtime.       Vitals:   06/27/19 1101 06/27/19 1129  BP: 133/71 128/72  Pulse: 79  67  Resp: 19 18  Temp: 97.7 F (36.5 C) 97.7 F (36.5 C)  SpO2: 100% 100%    Darnelle Catalan

## 2019-06-27 NOTE — Discharge Summary (Signed)
Physician Discharge Summary  Kelly Byrd [id number] DOB: [birth year] DOA: 06/25/2019  PCP: Charlott Rakes, MD  Admit date: 06/25/2019 Discharge date: 06/27/2019  Admitted From: Home Disposition: Home   Recommendations for Outpatient Follow-up:  1. Follow up with PCP in 1-2 weeks 2. Follow up with allergist/immunologist as soon as possible. Continue steroids, antihistamines, prn epinephrine also prescribed with return precautions. Consider RAST. 3. C1 esterase inhibitor lab sent and pending at discharge. Update: Level is 33 (wnl of 21-39).   Home Health: None Equipment/Devices: None Discharge Condition: Stable CODE STATUS: Full Diet recommendation: As tolerated.  Brief/Interim Summary: This is 44 yo female with a hx of Sickle Cell Trait, Migraines, Kidney Stones, HTN, GERD, Back Pain, and Asthma.  She presented to [facility] ER on 03/29 with c/o generalized itching, tongue swelling, difficulty swallowing, and right eye/facial swelling.  She states she had similar symptoms on 03/23, which was 2 days prior to her beach trip.  She took benadryl and the symptoms resolved on 03/27 while she was at the beach.  She does endorse having a peanut allergy, and also developed angioedema secondary to lisinopril.  She denies taking new medications.  While at the beach she did eat at various restaurants, however 2 days prior to her beach trip when she developed similar symptoms she did not.  She does endorse recent stress and anxiety (her stepson recently died, and her son was murdered 3 yrs ago and the anniversary was recently).  In the ER labs revealed K+ 3.1, glucose 107, calcium 8.7, wbc 12.1, hgb 10.8, and COVID-19 negative.  CT Abd Pelvis negative for acute findings, but revealed small non obstructing left renal calculus and likely fibroid uterus. She received 25 mg of iv benadryl, 125 mg of iv solumedrol, 1,000 mg of tranexamic acid, and 40 meq of potassium.  She was subsequently admitted to the  stepdown unit for additional workup and treatment.   With treatment, symptoms have improved and she was transferred to the floor 3/30, stable for discharge on 3/31 after reassuring nasolaryngoscopy by ENT.  Discharge Diagnoses:  Principal Problem:   Allergic reaction Active Problems:   Migraine   Anemia, iron deficiency   Hypertension   Asthma   Hyperlipidemia   Major depressive disorder, recurrent severe without psychotic features (HCC)   Hypokalemia   Abdominal pain   Angioedema   Facial swelling  Allergic reaction: Improving with supportive measures without precipitating agent clearly identified.  - Continue prednisone, scheduled and prn antihistamines. Epi pens also prescribed at discharge.  - Can follow up with ENT. Dr. Richardson Landry performed fiberoptic nasolaryngoscopy on day of discharge which was benign. He recommended: Continue steroids, antihistamines as outpatient once primary service is comfortable with discharge. Consider ordering RAST for hidden foods. She can follow up at our office if desired for additional testing after a food diary completed, or see another local allergist. She should have an Epipen at discharge.  Bacteriuria: Pt without symptoms. Culture grew Aerococcus.   Other medical conditions were stable   Discharge Instructions Discharge Instructions    Diet - low sodium heart healthy   Complete by: As directed    Discharge instructions   Complete by: As directed    Continue taking steroids at discharge with a taper of prednisone (sent to your pharmacy). Also take famotidine twice daily and benadryl as needed. 2 Epipens have been sent to your pharmacy as well to be used if you have a severe allergic reaction.   Call your PCP to schedule  follow up as soon as possible.   Call Dr. Seward Meth office (allergist) to schedule an appointment for allergy testing as soon as possible. If you'd like you can alternative follow up with Dr. Reola Mosher office (the surgeon who  performed laryngoscopy today). It would be beneficial to keep a food diary until follow up to possibly identify allergens.   If your symptoms worsen, seek medical attention right away.   Increase activity slowly   Complete by: As directed      Allergies as of 06/27/2019      Reactions   Ace Inhibitors Swelling   Lisinopril    Swelling--caused the pt to stay in the hospital   Peanuts [peanut Oil]    Swelling--caused the pt to stay in the hospital      Medication List    TAKE these medications   albuterol 108 (90 Base) MCG/ACT inhaler Commonly known as: VENTOLIN HFA Inhale 2 puffs into the lungs every 6 (six) hours as needed for wheezing or shortness of breath (wheezing).   amLODipine 10 MG tablet Commonly known as: NORVASC Take 1 tablet (10 mg total) by mouth daily.   atorvastatin 20 MG tablet Commonly known as: LIPITOR Take 1 tablet (20 mg total) by mouth daily.   butalbital-acetaminophen-caffeine 50-325-40 MG tablet Commonly known as: FIORICET Take 1 tablet by mouth every 6 (six) hours as needed for headache.   diphenhydrAMINE 50 MG tablet Commonly known as: BENADRYL Take 1 tablet (50 mg total) by mouth every 8 (eight) hours as needed for allergies.   EPINEPHrine 0.3 mg/0.3 mL Soaj injection Commonly known as: EPI-PEN Inject 0.3 mLs (0.3 mg total) into the muscle as needed for anaphylaxis.   famotidine 20 MG tablet Commonly known as: PEPCID Take 1 tablet (20 mg total) by mouth 2 (two) times daily.   gabapentin 300 MG capsule Commonly known as: NEURONTIN Take 2 capsules (600 mg total) by mouth 2 (two) times daily.   hydrOXYzine 50 MG tablet Commonly known as: ATARAX/VISTARIL Take 1 tablet (50 mg total) by mouth every 6 (six) hours as needed for anxiety.   meloxicam 7.5 MG tablet Commonly known as: Mobic Take 1 tablet (7.5 mg total) by mouth daily.   montelukast 10 MG tablet Commonly known as: SINGULAIR Take 1 tablet (10 mg total) by mouth at bedtime.    ondansetron 4 MG disintegrating tablet Commonly known as: Zofran ODT Take 1 tablet (4 mg total) by mouth every 8 (eight) hours as needed for nausea or vomiting.   predniSONE 10 MG tablet Commonly known as: DELTASONE Take 5 tablets (50 mg total) by mouth daily with breakfast for 1 day, THEN 4 tablets (40 mg total) daily with breakfast for 1 day, THEN 3 tablets (30 mg total) daily with breakfast for 1 day, THEN 2 tablets (20 mg total) daily with breakfast for 1 day, THEN 1 tablet (10 mg total) daily with breakfast for 1 day. Start taking on: June 27, 2019   QUEtiapine 50 MG tablet Commonly known as: SEROQUEL Take 1 tablet (50 mg total) by mouth at bedtime. What changed: how much to take   sertraline 50 MG tablet Commonly known as: ZOLOFT Take 1 tablet (50 mg total) by mouth daily.   SUMAtriptan 100 MG tablet Commonly known as: Imitrex 100 mg orally at the onset of a migraine, may repeat in 2 hours if headache persists or recurs. Max 200mg  in 24 hrs   topiramate 50 MG tablet Commonly known as: Topamax Take 2 tablets (100 mg  total) by mouth 2 (two) times daily.   traZODone 100 MG tablet Commonly known as: DESYREL Take 1 tablet (100 mg total) by mouth at bedtime.      Follow-up Information    Tiajuana Amass, MD. Go on 07/11/2019.   Specialty: Allergy and Immunology Why: PCP needs to refer patient to office of Dr. Tillman Sers information: [address] [place] [address] [phone]        Charlott Rakes, MD. Schedule an appointment as soon as possible for a visit on 07/02/2019.   Specialty: Family Medicine Why: office stated they dont have any follow up available. Call back Monday 06/27/2019 Contact information: [address] [place] [address] [phone]          Allergies  Allergen Reactions  . Ace Inhibitors Swelling  . Lisinopril     Swelling--caused the pt to stay in the hospital  . Peanuts [Peanut Oil]     Swelling--caused  the pt to stay in the hospital    Consultations:  ENT  PCCM  Procedures/Studies: CT ABDOMEN PELVIS WO CONTRAST  Result Date: 06/25/2019 CLINICAL DATA:  Abdominal pain EXAM: CT ABDOMEN AND PELVIS WITHOUT CONTRAST TECHNIQUE: Multidetector CT imaging of the abdomen and pelvis was performed following the standard protocol without IV contrast. COMPARISON:  12/18/2018 FINDINGS: Lower chest: No acute abnormality. Hepatobiliary: No focal liver abnormality is seen. No gallstones, gallbladder wall thickening, or biliary dilatation. Pancreas: Unremarkable. Spleen: Unremarkable. Adrenals/Urinary Tract: Adrenals are unremarkable. A 2 mm nonobstructing calculus of the lower pole of the left kidney the is again identified. There is no hydronephrosis. Bladder is unremarkable. Stomach/Bowel: Stomach is within normal limits. Bowel is normal in caliber. Normal appendix. Vascular/Lymphatic: Aortic atherosclerosis. No enlarged abdominal or pelvic lymph nodes. Reproductive: Enlarged with several calcifications probably associated with fibroids. Ovaries are unremarkable by CT. Other: No ascites.  No abdominal wall hernia. 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 Chronic findings at the right hip. IMPRESSION: No new findings. Small nonobstructing left renal calculus. Enlarged, likely fibroid uterus. Electronically Signed   By: Macy Mis M.D.   On: 06/25/2019 13:16   Nasolaryngoscopy  PROCEDURE: Procedure: Diagnostic Fiberoptic Nasolaryngoscopy Diagnosis: Dysphagia, allergic reaction Indications: Allergic reaction with trouble swallowing  Findings:Nasal cavity with some dryness and crusting and mild edema but no infection. Nasopharynx clear. TVC clear and mobile with no edema of the cords or the supraglottic larynx. Mild lingual tonsillar hyperplasia without erythema or exudate. Hypopharynx otherwise clear Description of Procedure: After discussing procedure and risks  (primarily nose  bleed) with the patient, the nose was anesthetized with topical Lidocaine 4% and decongested with phenylephrine. A flexible fiberoptic scope was passed through the nasal cavity. The nasal cavity was inspected and the scope passed through the Nasopharynx to the region of the hypopharynx and larynx. The patient was instructed to phonate to assess vocal cord mobility. The tongue was extended to evaluate the tongue base completely. Valsalva was performed to insufflate the hypopharynx for improved examination. Findings are as noted above. The scope was withdrawn. The patient tolerated the procedure well.  Subjective: Facial and other swelling is improved, but not completely resolved. No stridor, dyspnea. Reported some difficulty swallowing solids this morning, but this has resolved at time of discharge.  Discharge Exam: Vitals:   06/27/19 1101 06/27/19 1129  BP: 133/71 128/72  Pulse: 79 67  Resp: 19 18  Temp: 97.7 F (36.5 C) 97.7 F (36.5 C)  SpO2: 100% 100%   General:  Pt is alert, awake, not in acute distress HEENT: Mild diffuse edema on right face/cheek/eyelids without tenderness or erythema. Oropharynx grossly clear without significant tongue swelling. Cardiovascular: RRR, S1/S2 +, no rubs, no gallops Respiratory: CTA bilaterally, no wheezing, no rhonchi Abdominal: Soft, NT, ND, bowel sounds + Extremities: No edema, no cyanosis  Labs: BNP (last 3 results) No results for input(s): BNP in the last 8760 hours. Basic Metabolic Panel: Recent Labs  Lab 06/25/19 0441 06/25/19 1938 06/26/19 0356  NA 137  --  140  K 3.1* 4.0 3.7  CL 103  --  109  CO2 25  --  23  GLUCOSE 107*  --  143*  BUN 9  --  9  CREATININE 0.66  --  0.54  CALCIUM 8.7*  --  8.8*  MG  --  1.9 2.2  PHOS  --   --  2.4*   Liver Function Tests: No results for input(s): AST, ALT, ALKPHOS, BILITOT, PROT, ALBUMIN in the last 168 hours. Recent Labs  Lab 06/25/19 0441  LIPASE 44   No results for input(s): AMMONIA in  the last 168 hours. CBC: Recent Labs  Lab 06/25/19 0441 06/26/19 0356  WBC 12.1* 11.6*  NEUTROABS 7.5  --   HGB 10.8* 9.3*  HCT 36.1 31.1*  MCV 68.5* 68.7*  PLT 449* 388   Cardiac Enzymes: No results for input(s): CKTOTAL, CKMB, CKMBINDEX, TROPONINI in the last 168 hours. BNP: Invalid input(s): POCBNP CBG: Recent Labs  Lab 06/25/19 1310  GLUCAP 158*   D-Dimer No results for input(s): DDIMER in the last 72 hours. Hgb A1c No results for input(s): HGBA1C in the last 72 hours. Lipid Profile No results for input(s): CHOL, HDL, LDLCALC, TRIG, CHOLHDL, LDLDIRECT in the last 72 hours. Thyroid function studies No results for input(s): TSH, T4TOTAL, T3FREE, THYROIDAB in the last 72 hours.  Invalid input(s): FREET3 Anemia work up No results for input(s): VITAMINB12, FOLATE, FERRITIN, TIBC, IRON, RETICCTPCT in the last 72 hours. Urinalysis    Component Value Date/Time   COLORURINE YELLOW (A) 06/26/2019 2000   APPEARANCEUR HAZY (A) 06/26/2019 2000   APPEARANCEUR Hazy 04/20/2012 1349   LABSPEC 1.013 06/26/2019 2000   LABSPEC 1.020 04/20/2012 1349   PHURINE 7.0 06/26/2019 2000   GLUCOSEU NEGATIVE 06/26/2019 2000   GLUCOSEU Negative 04/20/2012 1349   HGBUR NEGATIVE 06/26/2019 2000   BILIRUBINUR NEGATIVE 06/26/2019 2000   BILIRUBINUR Negative 04/20/2012 1349   [place] 06/26/2019 2000   PROTEINUR NEGATIVE 06/26/2019 2000   NITRITE NEGATIVE 06/26/2019 2000   LEUKOCYTESUR TRACE (A) 06/26/2019 2000   LEUKOCYTESUR Negative 04/20/2012 1349    Microbiology Recent Results (from the past 240 hour(s))  SARS CORONAVIRUS 2 (TAT 6-24 HRS) Nasopharyngeal Nasopharyngeal Swab     Status: None   Collection Time: 06/25/19 10:15 AM   Specimen: Nasopharyngeal Swab  Result Value Ref Range Status   SARS Coronavirus 2 NEGATIVE NEGATIVE Final    Comment: (NOTE) SARS-CoV-2 target nucleic acids are NOT DETECTED. The SARS-CoV-2 RNA is generally detectable in upper and  lower respiratory specimens during the acute phase of infection. Negative results do not preclude SARS-CoV-2 infection, do not rule out co-infections with other pathogens, and should not be used as the sole basis for treatment or other patient management decisions. Negative results must be combined with clinical observations, patient history, and epidemiological information. The expected result is Negative. Fact Sheet for Patients: SugarRoll.be Fact Sheet for Healthcare Providers: https://www.woods-mathews.com/ This test is not yet approved or cleared by the  [place] Architectural technologist and  has been authorized for detection and/or diagnosis of SARS-CoV-2 by FDA under an Print production planner (EUA). This EUA will remain  in effect (meaning this test can be used) for the duration of the COVID-19 declaration under Section 56 4(b)(1) of the Act, 21 U.S.C. section 360bbb-3(b)(1), unless the authorization is terminated or revoked sooner. Performed at [facility], [place] [address] [place] [address], [place], [place] [address]   MRSA PCR Screening     Status: Abnormal   Collection Time: 06/25/19  1:34 PM   Specimen: Nasopharyngeal  Result Value Ref Range Status   MRSA by PCR POSITIVE (A) NEGATIVE Final    Comment:        The GeneXpert MRSA Assay (FDA approved for NASAL specimens only), is one component of a comprehensive MRSA colonization surveillance program. It is not intended to diagnose MRSA infection nor to guide or monitor treatment for MRSA infections. RESULT CALLED TO, READ BACK BY AND VERIFIED WITH: Adalberto Ill RN AT [id number] ON 06/25/19 [facility]  Performed at [facility], [address], [place], [place] [address]   Urine Culture     Status: Abnormal   Collection Time: 06/26/19  8:00 PM   Specimen: Urine, Random  Result Value Ref Range Status   Specimen Description   Final    URINE, RANDOM Performed at [facility], [address], [place], [place] [address]    Special Requests   Final    NONE Performed at [facility], [address] [place], [place] [address]    Culture (A)  Final    >=100,000 COLONIES/mL AEROCOCCUS SPECIES Standardized susceptibility testing for this organism is not available. Performed at [facility], [place] [address], [place], [place] [address]    Report Status 06/28/2019 FINAL  Final    Time coordinating discharge: Approximately 40 minutes  Patrecia Pour, MD  [facility] 06/30/2019, 5:01 PM

## 2019-06-27 NOTE — TOC Initial Note (Signed)
Transition of Care [facility]) - Initial/Assessment Note    Patient Details  Name: Kelly Byrd MRN: [id number] Date of Birth: 06-Nov-1975  Transition of Care [facility]) CM/SW Contact:    Beverly Sessions, RN Phone Number: 06/27/2019, 10:43 AM  Clinical Narrative:                 Patient admitted from home with allergic reaction which started while she was at the beach.    Patient states she lives at home with her husband.    PCP [place].  Denies issues with transportation.  Husband to transport at discharge  [place] - Patients states that she receives a check the 1st of each month and uses that to purchase her medications.   Patient states that she uses a RW for ambulation in the home  Patient states that she has Medicaid PCS PCS services 7 days a week.  3.5 hours a day  Patient to discharge today.  No needs identified.  Patient does not have any skilled needs, and does not meet homebound criteria for home health services  Expected Discharge Plan: Home/Self Care Barriers to Discharge: No Barriers Identified   Patient Goals and CMS Choice        Expected Discharge Plan and Services Expected Discharge Plan: Home/Self Care   Discharge Planning Services: CM Consult     Expected Discharge Date: 06/27/19                                    Prior Living Arrangements/Services   Lives with:: Spouse Patient language and need for interpreter reviewed:: Yes Do you feel safe going back to the place where you live?: Yes      Need for Family Participation in Patient Care: Yes (Comment) Care giver support system in place?: Yes (comment) Current home services: DME, Other (comment)(PCS) Criminal Activity/Legal Involvement Pertinent to Current Situation/Hospitalization: No - Comment as needed  Activities of Daily Living Home Assistive Devices/Equipment: None ADL Screening (condition at time of admission) Patient's cognitive ability adequate to safely complete daily  activities?: Yes Is the patient deaf or have difficulty hearing?: No Does the patient have difficulty seeing, even when wearing glasses/contacts?: No Does the patient have difficulty concentrating, remembering, or making decisions?: No Patient able to express need for assistance with ADLs?: Yes Does the patient have difficulty dressing or bathing?: No Independently performs ADLs?: Yes (appropriate for developmental age) Does the patient have difficulty walking or climbing stairs?: No Weakness of Legs: Both Weakness of Arms/Hands: None  Permission Sought/Granted                  Emotional Assessment Appearance:: Appears stated age Attitude/Demeanor/Rapport: Gracious Affect (typically observed): Accepting Orientation: : Oriented to Self, Oriented to Place, Oriented to Situation, Oriented to  Time   Psych Involvement: No (comment)  Admission diagnosis:  Allergic reaction [T78.40XA] Anaphylaxis, initial encounter [T78.2XXA] Angioedema, initial encounter [T78.3XXA] Angioedema [T78.3XXA] Patient Active Problem List   Diagnosis Date Noted  . Angioedema 06/26/2019  . Facial swelling   . Allergic reaction 06/25/2019  . Hypokalemia 06/25/2019  . Abdominal pain 06/25/2019  . Major depressive disorder, recurrent severe without psychotic features ([place]) 10/11/2017  . Cocaine abuse ([place]) 10/11/2017  . Lumbar radiculopathy 05/31/2017  . Hyperlipidemia 05/31/2017  . GERD (gastroesophageal reflux disease) 08/05/2016  . Fibroids 09/19/2015  . Hypertension 08/29/2015  . Asthma 08/29/2015  . Back pain 08/29/2015  .  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 Anemia, iron deficiency 10/29/2014  . Menometrorrhagia 10/29/2014  . Migraine 10/27/2014   PCP:  Charlott Rakes, MD Pharmacy:   [facility] [address]), [place] [address] [place]) [place] [address] Phone: [phone] Fax: [phone]  [facility] [place], [place] [address] [place] [address] Phone: [phone] Fax: [phone]  [facility] [id number], [place] [address] [place] [address] Phone: [phone] Fax: [phone]     Social Determinants of Health (SDOH) Interventions    Readmission Risk Interventions Readmission Risk Prevention Plan 06/27/2019  Transportation Screening Complete  PCP or Specialist Appt within 3-5 Days (No Data)  [place] or [place] (No Data)  Palliative Care Screening Not Applicable  Medication Review (RN Care Manager) Complete  Some recent data might be hidden

## 2019-06-27 NOTE — Consult Note (Signed)
Kelly Byrd, Kelly Byrd [id number] January 01, 1976 [name], MD  Reason for Consult: dysphagia Requesting Physician: Patrecia Pour, MD Consulting Physician: [name]  HPI: This 44 y.o. year old female was admitted on 06/25/2019 for Allergic reaction [T78.40XA] Anaphylaxis, initial encounter [T78.2XXA] Angioedema, initial encounter [T78.3XXA] Angioedema [T78.3XXA]. Consult requested to assess airway of patient recovering from presumed allergic reaction, unknown cause. She had prior reaction in past to lisinopril but is now off of that with no new medications. Was at beach and developed issues after being in the water there with itching and facial swelling. Admitted for anaphylaxis and on Solumedrol, but still some facial edema on right side and felt tight to swallow this AM, no stridor. She says now the swallowing issue is better, still no trouble breathing. No known food allergies and did not have shellfish at the beach. No known seasonal allergies.   Allergies:  Allergies  Allergen Reactions  . Ace Inhibitors Swelling  . Lisinopril     Swelling--caused the pt to stay in the hospital  . Peanuts [Peanut Oil]     Swelling--caused the pt to stay in the hospital    Medications:  Medications Prior to Admission  Medication Sig Dispense Refill  . albuterol (VENTOLIN HFA) 108 (90 Base) MCG/ACT inhaler Inhale 2 puffs into the lungs every 6 (six) hours as needed for wheezing or shortness of breath (wheezing). 18 g 3  . amLODipine (NORVASC) 10 MG tablet Take 1 tablet (10 mg total) by mouth daily. 30 tablet 3  . atorvastatin (LIPITOR) 20 MG tablet Take 1 tablet (20 mg total) by mouth daily. 30 tablet 3  . butalbital-acetaminophen-caffeine (FIORICET) 50-325-40 MG tablet Take 1 tablet by mouth every 6 (six) hours as needed for headache. 12 tablet 0  . gabapentin (NEURONTIN) 300 MG capsule Take 2 capsules (600 mg total) by mouth 2 (two) times daily. 120 capsule 3  . hydrOXYzine (ATARAX/VISTARIL) 50 MG  tablet Take 1 tablet (50 mg total) by mouth every 6 (six) hours as needed for anxiety. 30 tablet 0  . meloxicam (MOBIC) 7.5 MG tablet Take 1 tablet (7.5 mg total) by mouth daily. 14 tablet 0  . montelukast (SINGULAIR) 10 MG tablet Take 1 tablet (10 mg total) by mouth at bedtime. 30 tablet 3  . ondansetron (ZOFRAN ODT) 4 MG disintegrating tablet Take 1 tablet (4 mg total) by mouth every 8 (eight) hours as needed for nausea or vomiting. 20 tablet 0  . QUEtiapine (SEROQUEL) 50 MG tablet Take 1 tablet (50 mg total) by mouth at bedtime. (Patient taking differently: Take 150 mg by mouth at bedtime. ) 30 tablet 0  . sertraline (ZOLOFT) 50 MG tablet Take 1 tablet (50 mg total) by mouth daily. 30 tablet 0  . SUMAtriptan (IMITREX) 100 MG tablet 100 mg orally at the onset of a migraine, may repeat in 2 hours if headache persists or recurs. Max 200mg  in 24 hrs 30 tablet 1  . topiramate (TOPAMAX) 50 MG tablet Take 2 tablets (100 mg total) by mouth 2 (two) times daily. 60 tablet 3  . traZODone (DESYREL) 100 MG tablet Take 1 tablet (100 mg total) by mouth at bedtime. 30 tablet 0  .  Current Facility-Administered Medications  Medication Dose Route Frequency Provider Last Rate Last Admin  . acetaminophen (TYLENOL) tablet 650 mg  650 mg Oral Q6H PRN Ivor Costa, MD      . albuterol (VENTOLIN HFA) 108 (90 Base) MCG/ACT inhaler 2 puff  2 puff Inhalation Q4H PRN  Ivor Costa, MD      . atorvastatin (LIPITOR) tablet 20 mg  20 mg Oral Daily Ivor Costa, MD   20 mg at 06/27/19 1051  . cefTRIAXone (ROCEPHIN) 1 g in sodium chloride 0.9 % 100 mL IVPB  1 g Intravenous Q24H Loletha Grayer, MD   Stopped at 06/26/19 1957  . Chlorhexidine Gluconate Cloth 2 % PADS 6 each  6 each Topical Daily Ivor Costa, MD   6 each at 06/25/19 2152  . enoxaparin (LOVENOX) injection 40 mg  40 mg Subcutaneous Q12H Ivor Costa, MD   40 mg at 06/27/19 0130  . EPINEPHrine (EPI-PEN) injection 0.3 mg  0.3 mg Intramuscular PRN Ivor Costa, MD      .  famotidine (PEPCID) tablet 20 mg  20 mg Oral BID Ivor Costa, MD   20 mg at 06/27/19 1051  . feeding supplement (GLUCERNA SHAKE) (GLUCERNA SHAKE) liquid 237 mL  237 mL Oral BID BM Ivor Costa, MD   237 mL at 06/27/19 1102  . hydrALAZINE (APRESOLINE) injection 5-10 mg  5-10 mg Intravenous Q2H PRN Awilda Bill, NP      . hydrOXYzine (ATARAX/VISTARIL) tablet 50 mg  50 mg Oral Q6H PRN Ivor Costa, MD      . loratadine (CLARITIN) tablet 10 mg  10 mg Oral Daily Loletha Grayer, MD   10 mg at 06/27/19 1051  . methylPREDNISolone sodium succinate (SOLU-MEDROL) 125 mg/2 mL injection 60 mg  60 mg Intravenous Q12H Ivor Costa, MD   60 mg at 06/27/19 0351  . montelukast (SINGULAIR) tablet 10 mg  10 mg Oral QHS Ivor Costa, MD   10 mg at 06/26/19 2103  . nicotine (NICODERM CQ - dosed in mg/24 hours) patch 21 mg  21 mg Transdermal Daily Ivor Costa, MD   21 mg at 06/27/19 1052  . ondansetron (ZOFRAN) injection 4 mg  4 mg Intravenous Q8H PRN Ivor Costa, MD      . oxyCODONE (Oxy IR/ROXICODONE) immediate release tablet 5 mg  5 mg Oral Q6H PRN Ivor Costa, MD   5 mg at 06/27/19 0735  . QUEtiapine (SEROQUEL) tablet 150 mg  150 mg Oral QHS Ivor Costa, MD   150 mg at 06/26/19 2102  . sertraline (ZOLOFT) tablet 50 mg  50 mg Oral Daily Ivor Costa, MD   50 mg at 06/27/19 1052  . SUMAtriptan (IMITREX) tablet 50 mg  50 mg Oral Q2H PRN Ivor Costa, MD      . topiramate (TOPAMAX) tablet 100 mg  100 mg Oral BID Ivor Costa, MD   100 mg at 06/27/19 1053  . traZODone (DESYREL) tablet 100 mg  100 mg Oral QHS Ivor Costa, MD   100 mg at 06/26/19 2103    PMH:  Past Medical History:  Diagnosis Date  . Asthma   . Back pain   . GERD (gastroesophageal reflux disease)   . Hypertension   . Kidney stone   . Migraines   . Sickle cell trait ([place])     Fam Hx:  Family History  Problem Relation Age of Onset  . Hypertension Mother   . Hypertension Father   . Diabetes Mellitus II Maternal Grandmother   . Lupus Other     Soc Hx:   Social History   Socioeconomic History  . Marital status: Married    Spouse name: Not on file  . Number of children: Not on file  . Years of education: Not on file  . Highest education level: Not on  file  Occupational History  . Occupation: disabled  Tobacco Use  . Smoking status: Current Every Day Smoker    Packs/day: 0.25    Types: Cigarettes  . Smokeless tobacco: Never Used  Substance and Sexual Activity  . Alcohol use: Yes    Alcohol/week: 1.0 standard drinks    Types: 1 Cans of beer per week    Comment: 2 x 40oz bud ice  . Drug use: No  . Sexual activity: Yes    Birth control/protection: Surgical  Other Topics Concern  . Not on file  Social History Narrative  . Not on file   Social Determinants of Health   Financial Resource Strain:   . Difficulty of Paying Living Expenses:   Food Insecurity:   . Worried About Charity fundraiser in the Last Year:   . Arboriculturist in the Last Year:   Transportation Needs:   . Film/video editor (Medical):   Marland Kitchen Lack of Transportation (Non-Medical):   Physical Activity:   . Days of Exercise per Week:   . Minutes of Exercise per Session:   Stress:   . Feeling of Stress :   Social Connections:   . Frequency of Communication with Friends and Family:   . Frequency of Social Gatherings with Friends and Family:   . Attends Religious Services:   . Active Member of Clubs or Organizations:   . Attends Archivist Meetings:   Marland Kitchen Marital Status:   Intimate Partner Violence:   . Fear of Current or Ex-Partner:   . Emotionally Abused:   Marland Kitchen Physically Abused:   . Sexually Abused:     PSH:  Past Surgical History:  Procedure Laterality Date  . CESAREAN SECTION     2  . Procedures since admission: No admission procedures for hospital encounter.  ROS: Review of systems normal other than 12 systems except per HPI.  PHYSICAL EXAM Vitals:  Vitals:   06/27/19 0451 06/27/19 1101  BP: 138/76 133/71  Pulse: 69 79   Resp: 20 19  Temp: 97.7 F (36.5 C) 97.7 F (36.5 C)  SpO2: 100% 100%  . General: Well-developed, Well-nourished in no acute distress Mood: Mood and affect well adjusted, pleasant and cooperative. Orientation: Grossly alert and oriented. Vocal Quality: No hoarseness. Communicates verbally. head and Face: NCAT. Mild facial edema right side around eyelids/midface without erythema. No visible skin lesions. No significant facial scars. No tenderness with sinus percussion. Facial strength normal and symmetric. Ears: External ears with normal landmarks, no lesions. External auditory canals free of infection, cerumen impaction or lesions. Tympanic membranes intact with good landmarks and normal mobility on pneumatic otoscopy. No middle ear effusion. Hearing: Speech reception grossly normal. Nose: External nose normal with midline dorsum and no lesions or deformity. Nasal Cavity reveals left septal deviation and some nasal dryness/crusting. Mild mucosal congestion without erythema. Nasal secretions are minimal and clear. No polyps seen on anterior rhinoscopy. Oral Cavity/ Oropharynx: Lips are normal with no lesions. Teeth no frank dental caries, wears upper denture. Gingiva healthy with no lesions or gingivitis. Oropharynx including tongue, buccal mucosa, floor of mouth, hard and soft palate, uvula and posterior pharynx free of exudates, erythema or lesions with normal symmetry and hydration.  Indirect Laryngoscopy/Nasopharyngoscopy: Visualization of the larynx, hypopharynx and nasopharynx is not possible in this setting with routine examination. Neck: Supple and symmetric with no palpable masses, tenderness or crepitance. The trachea is midline. Thyroid gland is soft, nontender and symmetric with no masses or  enlargement. Parotid and submandibular glands are soft, nontender and symmetric, without masses. Lymphatic: Cervical lymph nodes are without palpable lymphadenopathy or tenderness. Respiratory:  Normal respiratory effort without labored breathing. Cardiovascular: Carotid pulse shows regular rate and rhythm Neurologic: Cranial Nerves II through XII are grossly intact. Eyes: Gaze and Ocular Motility are grossly normal. PERRLA. No visible nystagmus.  MEDICAL DECISION MAKING: Data Review:  Results for orders placed or performed during the hospital encounter of 06/25/19 (from the past 48 hour(s))  Glucose, capillary     Status: Abnormal   Collection Time: 06/25/19  1:10 PM  Result Value Ref Range   Glucose-Capillary 158 (H) 70 - 99 mg/dL    Comment: Glucose reference range applies only to samples taken after fasting for at least 8 hours.  MRSA PCR Screening     Status: Abnormal   Collection Time: 06/25/19  1:34 PM   Specimen: Nasopharyngeal  Result Value Ref Range   MRSA by PCR POSITIVE (A) NEGATIVE    Comment:        The GeneXpert MRSA Assay (FDA approved for NASAL specimens only), is one component of a comprehensive MRSA colonization surveillance program. It is not intended to diagnose MRSA infection nor to guide or monitor treatment for MRSA infections. RESULT CALLED TO, READ BACK BY AND VERIFIED WITH: Adalberto Ill RN AT [id number] ON 06/25/19 [facility]  Performed at [facility], [address] [place], [place] [address]   Potassium     Status: None   Collection Time: 06/25/19  7:38 PM  Result Value Ref Range   Potassium 4.0 3.5 - 5.1 mmol/L    Comment: Performed at [facility], [address] [place],  [address]  Magnesium     Status: None   Collection Time: 06/25/19  7:38 PM  Result Value Ref Range   Magnesium 1.9 1.7 - 2.4 mg/dL    Comment: Performed at [facility], [address] [place],  [address]  Procalcitonin - Baseline     Status: None   Collection Time: 06/25/19  7:38 PM  Result Value Ref Range   Procalcitonin <0.10 ng/mL    Comment:        Interpretation: PCT (Procalcitonin) <= 0.5 ng/mL: Systemic infection  (sepsis) is not likely. Local bacterial infection is possible. (NOTE)       Sepsis PCT Algorithm           Lower Respiratory Tract                                      Infection PCT Algorithm    ----------------------------     ----------------------------         PCT < 0.25 ng/mL                PCT < 0.10 ng/mL         Strongly encourage             Strongly discourage   discontinuation of antibiotics    initiation of antibiotics    ----------------------------     -----------------------------       PCT 0.25 - 0.50 ng/mL            PCT 0.10 - 0.25 ng/mL               OR       >80% decrease in PCT  Discourage initiation of                                            antibiotics      Encourage discontinuation           of antibiotics    ----------------------------     -----------------------------         PCT >= 0.50 ng/mL              PCT 0.26 - 0.50 ng/mL               AND        <80% decrease in PCT             Encourage initiation of                                             antibiotics       Encourage continuation           of antibiotics    ----------------------------     -----------------------------        PCT >= 0.50 ng/mL                  PCT > 0.50 ng/mL               AND         increase in PCT                  Strongly encourage                                      initiation of antibiotics    Strongly encourage escalation           of antibiotics                                     -----------------------------                                           PCT <= 0.25 ng/mL                                                 OR                                        > 80% decrease in PCT                                     Discontinue / Do not initiate  antibiotics Performed at [facility], [address] [place], [place] [address]   Pregnancy, urine     Status: None   Collection Time: 06/25/19  8:00 PM   Result Value Ref Range   Preg Test, Ur NEGATIVE NEGATIVE    Comment: Performed at [facility], [address] [place], [place] [address]  HIV Antibody (routine testing w rflx)     Status: None   Collection Time: 06/26/19  3:56 AM  Result Value Ref Range   HIV Screen 4th Generation wRfx NON REACTIVE NON REACTIVE    Comment: Performed at [facility] [address], [place], [place] [address]  Magnesium     Status: None   Collection Time: 06/26/19  3:56 AM  Result Value Ref Range   Magnesium 2.2 1.7 - 2.4 mg/dL    Comment: Performed at [facility], [address] [place], [place] [id number]  Basic metabolic panel     Status: Abnormal   Collection Time: 06/26/19  3:56 AM  Result Value Ref Range   Sodium 140 135 - 145 mmol/L   Potassium 3.7 3.5 - 5.1 mmol/L   Chloride 109 98 - 111 mmol/L   CO2 23 22 - 32 mmol/L   Glucose, Bld 143 (H) 70 - 99 mg/dL    Comment: Glucose reference range applies only to samples taken after fasting for at least 8 hours.   BUN 9 6 - 20 mg/dL   Creatinine, Ser 0.54 0.44 - 1.00 mg/dL   Calcium 8.8 (L) 8.9 - 10.3 mg/dL   GFR calc non Af Amer >60 >60 mL/min   GFR calc Af Amer >60 >60 mL/min   Anion gap 8 5 - 15    Comment: Performed at [facility], [address] [place], [place] [address]  CBC     Status: Abnormal   Collection Time: 06/26/19  3:56 AM  Result Value Ref Range   WBC 11.6 (H) 4.0 - 10.5 K/uL   RBC 4.53 3.87 - 5.11 MIL/uL   Hemoglobin 9.3 (L) 12.0 - 15.0 g/dL   HCT 31.1 (L) 36.0 - 46.0 %   MCV 68.7 (L) 80.0 - 100.0 fL   MCH 20.5 (L) 26.0 - 34.0 pg   MCHC 29.9 (L) 30.0 - 36.0 g/dL   RDW 18.4 (H) 11.5 - 15.5 %   Platelets 388 150 - 400 K/uL   nRBC 0.0 0.0 - 0.2 %    Comment: Performed at [facility], [address] [place], [place] [address]  Procalcitonin     Status: None   Collection Time: 06/26/19  3:56 AM  Result Value Ref Range   Procalcitonin <0.10 ng/mL    Comment:         Interpretation: PCT (Procalcitonin) <= 0.5 ng/mL: Systemic infection (sepsis) is not likely. Local bacterial infection is possible. (NOTE)       Sepsis PCT Algorithm           Lower Respiratory Tract                                      Infection PCT Algorithm    ----------------------------     ----------------------------         PCT < 0.25 ng/mL                PCT < 0.10 ng/mL  Strongly encourage             Strongly discourage   discontinuation of antibiotics    initiation of antibiotics    ----------------------------     -----------------------------       PCT 0.25 - 0.50 ng/mL            PCT 0.10 - 0.25 ng/mL               OR       >80% decrease in PCT            Discourage initiation of                                            antibiotics      Encourage discontinuation           of antibiotics    ----------------------------     -----------------------------         PCT >= 0.50 ng/mL              PCT 0.26 - 0.50 ng/mL               AND        <80% decrease in PCT             Encourage initiation of                                             antibiotics       Encourage continuation           of antibiotics    ----------------------------     -----------------------------        PCT >= 0.50 ng/mL                  PCT > 0.50 ng/mL               AND         increase in PCT                  Strongly encourage                                      initiation of antibiotics    Strongly encourage escalation           of antibiotics                                     -----------------------------                                           PCT <= 0.25 ng/mL                                                 OR                                        >   80% decrease in PCT                                     Discontinue / Do not initiate                                             antibiotics Performed at [facility], [address] [place], [place] [address]    Phosphorus     Status: Abnormal   Collection Time: 06/26/19  3:56 AM  Result Value Ref Range   Phosphorus 2.4 (L) 2.5 - 4.6 mg/dL    Comment: Performed at [facility], [address] [place], [place] [address]  Urinalysis, Complete w Microscopic     Status: Abnormal   Collection Time: 06/26/19  8:00 PM  Result Value Ref Range   Color, Urine YELLOW (A) YELLOW   APPearance HAZY (A) CLEAR   Specific Gravity, Urine 1.013 1.005 - 1.030   pH 7.0 5.0 - 8.0   Glucose, UA NEGATIVE NEGATIVE mg/dL   Hgb urine dipstick NEGATIVE NEGATIVE   Bilirubin Urine NEGATIVE NEGATIVE   Ketones, ur NEGATIVE NEGATIVE mg/dL   Protein, ur NEGATIVE NEGATIVE mg/dL   Nitrite NEGATIVE NEGATIVE   Leukocytes,Ua TRACE (A) NEGATIVE   RBC / HPF 0-5 0 - 5 RBC/hpf   WBC, UA 0-5 0 - 5 WBC/hpf   Bacteria, UA RARE (A) NONE SEEN   Squamous Epithelial / LPF 0-5 0 - 5   Mucus PRESENT     Comment: Performed at [facility], [address] [place], [place] [address]  Procalcitonin     Status: None   Collection Time: 06/27/19  5:53 AM  Result Value Ref Range   Procalcitonin <0.10 ng/mL    Comment:        Interpretation: PCT (Procalcitonin) <= 0.5 ng/mL: Systemic infection (sepsis) is not likely. Local bacterial infection is possible. (NOTE)       Sepsis PCT Algorithm           Lower Respiratory Tract                                      Infection PCT Algorithm    ----------------------------     ----------------------------         PCT < 0.25 ng/mL                PCT < 0.10 ng/mL         Strongly encourage             Strongly discourage   discontinuation of antibiotics    initiation of antibiotics    ----------------------------     -----------------------------       PCT 0.25 - 0.50 ng/mL            PCT 0.10 - 0.25 ng/mL               OR       >80% decrease in PCT            Discourage initiation of  antibiotics      Encourage discontinuation            of antibiotics    ----------------------------     -----------------------------         PCT >= 0.50 ng/mL              PCT 0.26 - 0.50 ng/mL               AND        <80% decrease in PCT             Encourage initiation of                                             antibiotics       Encourage continuation           of antibiotics    ----------------------------     -----------------------------        PCT >= 0.50 ng/mL                  PCT > 0.50 ng/mL               AND         increase in PCT                  Strongly encourage                                      initiation of antibiotics    Strongly encourage escalation           of antibiotics                                     -----------------------------                                           PCT <= 0.25 ng/mL                                                 OR                                        > 80% decrease in PCT                                     Discontinue / Do not initiate                                             antibiotics Performed at [facility], [address] [place], [place] [address]   . CT ABDOMEN PELVIS WO CONTRAST  Result Date: 06/25/2019 CLINICAL DATA:  Abdominal  pain EXAM: CT ABDOMEN AND PELVIS WITHOUT CONTRAST TECHNIQUE: Multidetector CT imaging of the abdomen and pelvis was performed following the standard protocol without IV contrast. COMPARISON:  12/18/2018 FINDINGS: Lower chest: No acute abnormality. Hepatobiliary: No focal liver abnormality is seen. No gallstones, gallbladder wall thickening, or biliary dilatation. Pancreas: Unremarkable. Spleen: Unremarkable. Adrenals/Urinary Tract: Adrenals are unremarkable. A 2 mm nonobstructing calculus of the lower pole of the left kidney the is again identified. There is no hydronephrosis. Bladder is unremarkable. Stomach/Bowel: Stomach is within normal limits. Bowel is normal in caliber. Normal appendix. Vascular/Lymphatic: Aortic  atherosclerosis. No enlarged abdominal or pelvic lymph nodes. Reproductive: Enlarged with several calcifications probably associated with fibroids. Ovaries are unremarkable by CT. Other: No ascites.  No abdominal wall hernia. 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 Chronic findings at the right hip. IMPRESSION: No new findings. Small nonobstructing left renal calculus. Enlarged, likely fibroid uterus. Electronically Signed   By: Macy Mis M.D.   On: 06/25/2019 13:16  .   PROCEDURE: Procedure: Diagnostic Fiberoptic Nasolaryngoscopy Diagnosis: Dysphagia, allergic reaction Indications: Allergic reaction with trouble swallowing Findings:Nasal cavity with some dryness and crusting and mild edema but no infection. Nasopharynx clear. TVC clear and mobile with no edema of the cords or the supraglottic larynx. Mild lingual tonsillar hyperplasia without erythema or exudate. Hypopharynx otherwise clear Description of Procedure: After discussing procedure and risks  (primarily nose bleed) with the patient, the nose was anesthetized with topical Lidocaine 4% and decongested with phenylephrine. A flexible fiberoptic scope was passed through the nasal cavity. The nasal cavity was inspected and the scope passed through the Nasopharynx to the region of the hypopharynx and larynx. The patient was instructed to phonate to assess vocal cord mobility. The tongue was extended to evaluate the tongue base completely. Valsalva was performed to insufflate the hypopharynx for improved examination. Findings are as noted above. The scope was withdrawn. The patient tolerated the procedure well.  ASSESSMENT: Dysphagia this AM was transient and see no evidence of airway compromise on scope exam, though she still has some residual facial edema from the allergic reaction. Difficult to identify cause, as could be due to some type of bug bite while at beach, food allergy, or medication reaction. While  there is no way to test for specific medication allergies, she could have a RAST test to assess for food allergies. A hidden food panel from [place] is a good starting point and could be ordered. Having patient perform a food diary for 2 weeks can allow for additional targeted testing.  PLAN: Continue steroids, antihistamines as outpatient once primary service is comfortable with discharge. Consider ordering RAST for hidden foods. She can follow up at our office if desired for additional testing after a food diary completed, or see another local allergist. She should have an Epipen at discharge.   [name], MD 06/27/2019 11:22 AM

## 2019-06-28 ENCOUNTER — Other Ambulatory Visit: Payer: Self-pay | Admitting: Family Medicine

## 2019-06-28 LAB — URINE CULTURE: Culture: >=100000 — AB

## 2019-06-28 MED ORDER — CEPHALEXIN 500 MG PO CAPS: 500.0000 mg | ORAL_CAPSULE | Freq: Two times a day (BID) | ORAL | 0 refills | Status: DC

## 2019-07-06 ENCOUNTER — Telehealth: Payer: Self-pay

## 2019-07-06 NOTE — Telephone Encounter (Signed)
-----   Message from Charlott Rakes, MD sent at 06/28/2019  5:07 PM EDT ----- Her urine culture from hospitalization revealed a UTI.I have sent a prescription for Keflex to her Pharmacy for this.

## 2019-07-06 NOTE — Telephone Encounter (Signed)
Patient was called on both numbers on file and there is no voicemail set up to leave a message.

## 2019-07-09 ENCOUNTER — Telehealth: Payer: Self-pay

## 2019-07-09 ENCOUNTER — Emergency Department: Payer: Medicaid Other

## 2019-07-09 ENCOUNTER — Emergency Department
Admission: EM | Admit: 2019-07-09 | Discharge: 2019-07-09 | Discharge status: Against Medical Advice | Payer: Medicaid Other | Attending: Emergency Medicine | Admitting: Emergency Medicine

## 2019-07-09 ENCOUNTER — Other Ambulatory Visit: Payer: Self-pay

## 2019-07-09 ENCOUNTER — Encounter: Payer: Self-pay | Admitting: Emergency Medicine

## 2019-07-09 DIAGNOSIS — R079 Chest pain, unspecified: Secondary | ICD-10-CM | POA: Insufficient documentation

## 2019-07-09 DIAGNOSIS — Z5321 Procedure and treatment not carried out due to patient leaving prior to being seen by health care provider: Secondary | ICD-10-CM | POA: Insufficient documentation

## 2019-07-09 LAB — BASIC METABOLIC PANEL
Anion gap: 7 (ref 5–15)
BUN: 9 mg/dL (ref 6–20)
CO2: 24 mmol/L (ref 22–32)
Calcium: 9.1 mg/dL (ref 8.9–10.3)
Chloride: 107 mmol/L (ref 98–111)
Creatinine, Ser: 0.61 mg/dL (ref 0.44–1.00)
GFR calc Af Amer: >60 mL/min (ref >60)
GFR calc non Af Amer: >60 mL/min (ref >60)
Glucose, Bld: 140 mg/dL — ABNORMAL HIGH (ref 70–99)
Potassium: 3 mmol/L — ABNORMAL LOW (ref 3.5–5.1)
Sodium: 138 mmol/L (ref 135–145)

## 2019-07-09 LAB — CBC
HCT: 31 % — ABNORMAL LOW (ref 36.0–46.0)
Hemoglobin: 9.4 g/dL — ABNORMAL LOW (ref 12.0–15.0)
MCH: 20.5 pg — ABNORMAL LOW (ref 26.0–34.0)
MCHC: 30.3 g/dL (ref 30.0–36.0)
MCV: 67.7 fL — ABNORMAL LOW (ref 80.0–100.0)
Platelets: 392 10*3/uL (ref 150–400)
RBC: 4.58 MIL/uL (ref 3.87–5.11)
RDW: 19.7 % — ABNORMAL HIGH (ref 11.5–15.5)
WBC: 12.4 10*3/uL — ABNORMAL HIGH (ref 4.0–10.5)
nRBC: 0 % (ref 0.0–0.2)

## 2019-07-09 LAB — TROPONIN I (HIGH SENSITIVITY): Troponin I (High Sensitivity): 7 ng/L (ref <18)

## 2019-07-09 NOTE — Telephone Encounter (Signed)
-----   Message from Charlott Rakes, MD sent at 06/28/2019  5:07 PM EDT ----- Her urine culture from hospitalization revealed a UTI.I have sent a prescription for Keflex to her Pharmacy for this.

## 2019-07-09 NOTE — ED Triage Notes (Signed)
Says sharp pain in left chest for 3 days that is intermittent.  Sharp and goes through to back.  No tenderness to abdomen.

## 2019-07-09 NOTE — Telephone Encounter (Signed)
Patient was called and a voicemail was left informing patient to return phone call. 

## 2019-07-10 ENCOUNTER — Telehealth: Payer: Self-pay | Admitting: Emergency Medicine

## 2019-07-10 NOTE — Telephone Encounter (Signed)
Called patient due to lwot to inquire about condition and follow up plans. Husband called back and says she is not there.  Asked him to let her know I called and that she should let her doctor know.

## 2019-07-16 ENCOUNTER — Ambulatory Visit: Payer: Medicaid Other | Admitting: Family Medicine

## 2019-07-22 ENCOUNTER — Encounter: Payer: Self-pay | Admitting: Emergency Medicine

## 2019-07-22 ENCOUNTER — Observation Stay
Admission: EM | Admit: 2019-07-22 | Discharge: 2019-07-23 | Disposition: A | Discharge status: Home / Self Care | Payer: Medicaid Other | Attending: Internal Medicine | Admitting: Internal Medicine

## 2019-07-22 ENCOUNTER — Other Ambulatory Visit: Payer: Self-pay

## 2019-07-22 DIAGNOSIS — E785 Hyperlipidemia, unspecified: Secondary | ICD-10-CM | POA: Insufficient documentation

## 2019-07-22 DIAGNOSIS — Z791 Long term (current) use of non-steroidal anti-inflammatories (NSAID): Secondary | ICD-10-CM | POA: Insufficient documentation

## 2019-07-22 DIAGNOSIS — T783XXA Angioneurotic edema, initial encounter: Secondary | ICD-10-CM | POA: Diagnosis not present

## 2019-07-22 DIAGNOSIS — F17210 Nicotine dependence, cigarettes, uncomplicated: Secondary | ICD-10-CM | POA: Insufficient documentation

## 2019-07-22 DIAGNOSIS — F332 Major depressive disorder, recurrent severe without psychotic features: Secondary | ICD-10-CM | POA: Insufficient documentation

## 2019-07-22 DIAGNOSIS — Z888 Allergy status to other drugs, medicaments and biological substances status: Secondary | ICD-10-CM | POA: Insufficient documentation

## 2019-07-22 DIAGNOSIS — X58XXXA Exposure to other specified factors, initial encounter: Secondary | ICD-10-CM | POA: Diagnosis not present

## 2019-07-22 DIAGNOSIS — Z91010 Allergy to peanuts: Secondary | ICD-10-CM | POA: Diagnosis not present

## 2019-07-22 DIAGNOSIS — Z20822 Contact with and (suspected) exposure to covid-19: Secondary | ICD-10-CM | POA: Diagnosis not present

## 2019-07-22 DIAGNOSIS — G43909 Migraine, unspecified, not intractable, without status migrainosus: Secondary | ICD-10-CM | POA: Diagnosis not present

## 2019-07-22 DIAGNOSIS — T783XXD Angioneurotic edema, subsequent encounter: Secondary | ICD-10-CM

## 2019-07-22 DIAGNOSIS — K219 Gastro-esophageal reflux disease without esophagitis: Secondary | ICD-10-CM | POA: Diagnosis not present

## 2019-07-22 DIAGNOSIS — J45909 Unspecified asthma, uncomplicated: Secondary | ICD-10-CM | POA: Diagnosis not present

## 2019-07-22 DIAGNOSIS — M1610 Unilateral primary osteoarthritis, unspecified hip: Secondary | ICD-10-CM | POA: Diagnosis not present

## 2019-07-22 DIAGNOSIS — Z79899 Other long term (current) drug therapy: Secondary | ICD-10-CM | POA: Diagnosis not present

## 2019-07-22 DIAGNOSIS — I10 Essential (primary) hypertension: Secondary | ICD-10-CM | POA: Diagnosis not present

## 2019-07-22 DIAGNOSIS — E876 Hypokalemia: Secondary | ICD-10-CM | POA: Insufficient documentation

## 2019-07-22 DIAGNOSIS — D573 Sickle-cell trait: Secondary | ICD-10-CM | POA: Diagnosis not present

## 2019-07-22 LAB — CBC WITH DIFFERENTIAL/PLATELET
Abs Immature Granulocytes: 0.03 10*3/uL (ref 0.00–0.07)
Basophils Absolute: 0 10*3/uL (ref 0.0–0.1)
Basophils Relative: 0 %
Eosinophils Absolute: 0.5 10*3/uL (ref 0.0–0.5)
Eosinophils Relative: 5 %
HCT: 32.4 % — ABNORMAL LOW (ref 36.0–46.0)
Hemoglobin: 9.7 g/dL — ABNORMAL LOW (ref 12.0–15.0)
Immature Granulocytes: 0 %
Lymphocytes Relative: 31 %
Lymphs Abs: 3.2 10*3/uL (ref 0.7–4.0)
MCH: 19.9 pg — ABNORMAL LOW (ref 26.0–34.0)
MCHC: 29.9 g/dL — ABNORMAL LOW (ref 30.0–36.0)
MCV: 66.5 fL — ABNORMAL LOW (ref 80.0–100.0)
Monocytes Absolute: 0.5 10*3/uL (ref 0.1–1.0)
Monocytes Relative: 5 %
Neutro Abs: 6 10*3/uL (ref 1.7–7.7)
Neutrophils Relative %: 59 %
Platelets: 541 10*3/uL — ABNORMAL HIGH (ref 150–400)
RBC: 4.87 MIL/uL (ref 3.87–5.11)
RDW: 19 % — ABNORMAL HIGH (ref 11.5–15.5)
WBC: 10.3 10*3/uL (ref 4.0–10.5)
nRBC: 0 % (ref 0.0–0.2)

## 2019-07-22 LAB — COMPREHENSIVE METABOLIC PANEL
ALT: 15 U/L (ref 0–44)
AST: 11 U/L — ABNORMAL LOW (ref 15–41)
Albumin: 3.4 g/dL — ABNORMAL LOW (ref 3.5–5.0)
Alkaline Phosphatase: 53 U/L (ref 38–126)
Anion gap: 7 (ref 5–15)
BUN: 7 mg/dL (ref 6–20)
CO2: 25 mmol/L (ref 22–32)
Calcium: 8.4 mg/dL — ABNORMAL LOW (ref 8.9–10.3)
Chloride: 106 mmol/L (ref 98–111)
Creatinine, Ser: 0.49 mg/dL (ref 0.44–1.00)
GFR calc Af Amer: >60 mL/min (ref >60)
GFR calc non Af Amer: >60 mL/min (ref >60)
Glucose, Bld: 109 mg/dL — ABNORMAL HIGH (ref 70–99)
Potassium: 3 mmol/L — ABNORMAL LOW (ref 3.5–5.1)
Sodium: 138 mmol/L (ref 135–145)
Total Bilirubin: 0.4 mg/dL (ref 0.3–1.2)
Total Protein: 6.8 g/dL (ref 6.5–8.1)

## 2019-07-22 LAB — TROPONIN I (HIGH SENSITIVITY): Troponin I (High Sensitivity): 6 ng/L (ref <18)

## 2019-07-22 LAB — ETHANOL: Alcohol, Ethyl (B): <10 mg/dL (ref <10)

## 2019-07-22 MED ORDER — DIPHENHYDRAMINE HCL 50 MG/ML IJ SOLN
50.0000 mg | Freq: Once | INTRAMUSCULAR | Status: AC
  Administered 2019-07-22: 50 mg via INTRAVENOUS
  Filled 2019-07-22: qty 1

## 2019-07-22 MED ORDER — METHYLPREDNISOLONE SODIUM SUCC 125 MG IJ SOLR
125.0000 mg | Freq: Once | INTRAMUSCULAR | Status: AC
  Administered 2019-07-22: 125 mg via INTRAVENOUS
  Filled 2019-07-22: qty 2

## 2019-07-22 NOTE — ED Provider Notes (Signed)
Kelly Byrd       Time seen: ----------------------------------------- 10:14 PM on 07/22/2019 -----------------------------------------   I have reviewed the triage vital signs and the nursing notes.  HISTORY   Chief Complaint Allergic Reaction    HPI Kelly Byrd is a 44 y.o. female with a history of asthma, back pain, GERD, hypertension, kidney stones, migraines, sickle cell trait who presents to the ED for lip, tongue and eye swelling that started several hours ago.  Patient states she took epinephrine and Benadryl and states that the tongue and swelling was improving but that it started to get worse again.  Past Medical History:  Diagnosis Date  . Asthma   . Back pain   . GERD (gastroesophageal reflux disease)   . Hypertension   . Kidney stone   . Migraines   . Sickle cell trait [facility])     Patient Active Problem List   Diagnosis Date Noted  . Angioedema 06/26/2019  . Facial swelling   . Allergic reaction 06/25/2019  . Hypokalemia 06/25/2019  . Abdominal pain 06/25/2019  . 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 Cocaine abuse ([place]) 10/11/2017  . Lumbar radiculopathy 05/31/2017  . Hyperlipidemia 05/31/2017  . GERD (gastroesophageal reflux disease) 08/05/2016  . Fibroids 09/19/2015  . Hypertension 08/29/2015  . Asthma 08/29/2015  . Back pain 08/29/2015  . 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 Anemia, iron deficiency 10/29/2014  . Menometrorrhagia 10/29/2014  . Migraine 10/27/2014    Past Surgical History:  Procedure Laterality Date  . CESAREAN SECTION     2    Allergies Ace inhibitors, Lisinopril, and Peanuts [peanut oil]  Social History Social History   Tobacco Use  . Smoking status: Current Every Day Smoker    Packs/day: 0.25    Types: Cigarettes  . Smokeless tobacco: Never Used  Substance Use Topics  . Alcohol  use: Yes    Alcohol/week: 1.0 standard drinks    Types: 1 Cans of beer per week  . Drug use: No   Review of Systems Constitutional: Negative for fever. HEENT: Positive for lip and tongue swelling, facial swelling Cardiovascular: Negative for chest pain. Respiratory: Negative for shortness of breath. Gastrointestinal: Negative for abdominal pain, vomiting and diarrhea. Musculoskeletal: Negative for back pain. Skin: Negative for rash. Neurological: Negative for headaches, focal weakness or numbness.  All systems negative/normal/unremarkable except as stated in the HPI  ____________________________________________   PHYSICAL EXAM:  VITAL SIGNS: ED Triage Vitals [07/22/19 2210]  Enc Vitals Group     BP (!) 171/94     Pulse Rate 99     Resp 18     Temp 98.5 F (36.9 C)     Temp Source Oral     SpO2 100 %     Weight 235 lb (106.6 kg)     Height 5\' 4"  (1.626 m)     Head Circumference      Peak Flow      Pain Score 10     Pain Loc      Pain Edu?      Excl. in [place]?     Constitutional: Alert and oriented.  No acute distress Eyes: Conjunctivae are normal. Normal extraocular movements. ENT      Head: Normocephalic and atraumatic.,  Left sided facial and periorbital swelling and edema is noted      Nose: No congestion/rhinnorhea.      Mouth/Throat: Mucous membranes are moist.  There is diffuse lower lip swelling, no intraoral involvement, no tongue swelling at this time      Neck: No stridor. Cardiovascular: Normal rate, regular rhythm. No murmurs, rubs, or gallops. Respiratory: Normal respiratory effort without tachypnea nor retractions. Breath sounds are clear and equal bilaterally. No wheezes/rales/rhonchi. Gastrointestinal: Soft and nontender. Normal bowel sounds Musculoskeletal: Nontender with normal range of motion in extremities. No lower extremity tenderness nor edema. Neurologic:  Normal speech and language. No gross focal neurologic deficits are appreciated.  Skin:   Skin is warm, dry and intact. No rash noted. Psychiatric: Mood and affect are normal. Speech and behavior are normal.  ____________________________________________  ED COURSE:  As part of my medical decision making, I reviewed the following data within the [place] History obtained from family if available, nursing notes, old chart and ekg, as well as notes from prior ED visits. Patient presented for allergic reaction, we will assess with labs and imaging as indicated at this time.   Procedures  Kelly Byrd was evaluated in Emergency Department on 07/22/2019 for the symptoms described in the history of present illness. She was evaluated in the context of the global COVID-19 pandemic, which necessitated consideration that the patient might be at risk for infection with the SARS-CoV-2 virus that causes COVID-19. Institutional protocols and algorithms that pertain to the evaluation of patients at risk for COVID-19 are in a state of rapid change based on information released by regulatory bodies including the CDC and federal and state organizations. These policies and algorithms were followed during the patient's care in the ED.  ____________________________________________   LABS (pertinent positives/negatives)  Labs Reviewed  CBC WITH DIFFERENTIAL/PLATELET  COMPREHENSIVE METABOLIC PANEL  URINALYSIS, COMPLETE (UACMP) WITH MICROSCOPIC  ETHANOL  URINE DRUG SCREEN, QUALITATIVE ([facility] ONLY)  CBG MONITORING, ED  TROPONIN I (HIGH SENSITIVITY)   ___________________________________________   DIFFERENTIAL DIAGNOSIS   Anaphylaxis, angioedema, C1 esterase deficiency  FINAL ASSESSMENT AND PLAN  Allergic reaction   Plan: The patient had presented for allergic reaction which had improved at home and now is worsened, she was recently admitted for anaphylaxis. Patient's labs are still pending at this time.  Anticipate need for admission due to refractory anaphylaxis or  angioedema   Laurence Aly, MD    Byrd: This Byrd was generated in part or whole with voice recognition software. Voice recognition is usually quite accurate but there are transcription errors that can and very often do occur. I apologize for any typographical errors that were not detected and corrected.     Earleen Newport, MD 07/22/19 [phone]

## 2019-07-22 NOTE — ED Notes (Signed)
Pt reports her daughter woke her up due to her face swelling and she took an epi pen at home, denies any known allergen or lotion/purfume- states "it just happened." Pt left side of face and lower lip is notably swollen. Pt denies difficulty breathing, states her tongue feels tingly. Pt c/o of headache. Lights dimmed, pt given warm blanket and updated of plan of care.

## 2019-07-22 NOTE — ED Triage Notes (Signed)
Patient states that she started having lip, tongue and eye swelling that started about 3 hours ago. Patient states that she took epi and benadryl about 2 hours ago. She states that she had some improvement but that the swelling has started getting worse again.

## 2019-07-23 DIAGNOSIS — E785 Hyperlipidemia, unspecified: Secondary | ICD-10-CM | POA: Diagnosis not present

## 2019-07-23 DIAGNOSIS — I10 Essential (primary) hypertension: Secondary | ICD-10-CM | POA: Diagnosis not present

## 2019-07-23 DIAGNOSIS — T783XXD Angioneurotic edema, subsequent encounter: Secondary | ICD-10-CM

## 2019-07-23 DIAGNOSIS — E876 Hypokalemia: Secondary | ICD-10-CM

## 2019-07-23 DIAGNOSIS — T783XXA Angioneurotic edema, initial encounter: Secondary | ICD-10-CM | POA: Diagnosis not present

## 2019-07-23 LAB — CBC
HCT: 34.2 % — ABNORMAL LOW (ref 36.0–46.0)
Hemoglobin: 10 g/dL — ABNORMAL LOW (ref 12.0–15.0)
MCH: 20 pg — ABNORMAL LOW (ref 26.0–34.0)
MCHC: 29.2 g/dL — ABNORMAL LOW (ref 30.0–36.0)
MCV: 68.5 fL — ABNORMAL LOW (ref 80.0–100.0)
Platelets: 526 10*3/uL — ABNORMAL HIGH (ref 150–400)
RBC: 4.99 MIL/uL (ref 3.87–5.11)
RDW: 18.7 % — ABNORMAL HIGH (ref 11.5–15.5)
WBC: 10.8 10*3/uL — ABNORMAL HIGH (ref 4.0–10.5)
nRBC: 0 % (ref 0.0–0.2)

## 2019-07-23 LAB — BASIC METABOLIC PANEL
Anion gap: 10 (ref 5–15)
BUN: 7 mg/dL (ref 6–20)
CO2: 21 mmol/L — ABNORMAL LOW (ref 22–32)
Calcium: 8.8 mg/dL — ABNORMAL LOW (ref 8.9–10.3)
Chloride: 105 mmol/L (ref 98–111)
Creatinine, Ser: 0.8 mg/dL (ref 0.44–1.00)
GFR calc Af Amer: >60 mL/min (ref >60)
GFR calc non Af Amer: >60 mL/min (ref >60)
Glucose, Bld: 258 mg/dL — ABNORMAL HIGH (ref 70–99)
Potassium: 3.7 mmol/L (ref 3.5–5.1)
Sodium: 136 mmol/L (ref 135–145)

## 2019-07-23 LAB — RESPIRATORY PANEL BY RT PCR (FLU A&B, COVID)
Influenza A by PCR: NEGATIVE
Influenza B by PCR: NEGATIVE
SARS Coronavirus 2 by RT PCR: NEGATIVE

## 2019-07-23 LAB — MAGNESIUM: Magnesium: 1.8 mg/dL (ref 1.7–2.4)

## 2019-07-23 MED ORDER — TRAZODONE HCL 50 MG PO TABS: 25.0000 mg | ORAL_TABLET | Freq: Every evening | ORAL | Status: DC | PRN

## 2019-07-23 MED ORDER — MAGNESIUM HYDROXIDE 400 MG/5ML PO SUSP: 30.0000 mL | Freq: Every day | ORAL | Status: DC | PRN

## 2019-07-23 MED ORDER — ENOXAPARIN SODIUM 40 MG/0.4ML ~~LOC~~ SOLN
40.0000 mg | Freq: Two times a day (BID) | SUBCUTANEOUS | Status: DC
  Administered 2019-07-23 (×2): 40 mg via SUBCUTANEOUS
  Filled 2019-07-23 (×2): qty 0.4

## 2019-07-23 MED ORDER — METHYLPREDNISOLONE SODIUM SUCC 40 MG IJ SOLR
40.0000 mg | Freq: Three times a day (TID) | INTRAMUSCULAR | Status: DC
  Administered 2019-07-23 (×2): 40 mg via INTRAVENOUS
  Filled 2019-07-23 (×2): qty 1

## 2019-07-23 MED ORDER — HYDROXYZINE HCL 25 MG PO TABS: 50.0000 mg | ORAL_TABLET | Freq: Three times a day (TID) | ORAL | Status: DC | PRN

## 2019-07-23 MED ORDER — ONDANSETRON HCL 4 MG PO TABS: 4.0000 mg | ORAL_TABLET | Freq: Four times a day (QID) | ORAL | Status: DC | PRN

## 2019-07-23 MED ORDER — DIPHENHYDRAMINE HCL 25 MG PO CAPS: 50.0000 mg | ORAL_CAPSULE | Freq: Three times a day (TID) | ORAL | Status: DC | PRN

## 2019-07-23 MED ORDER — CELECOXIB 100 MG PO CAPS
100.0000 mg | ORAL_CAPSULE | Freq: Every day | ORAL | Status: DC
  Administered 2019-07-23: 100 mg via ORAL
  Filled 2019-07-23 (×2): qty 1

## 2019-07-23 MED ORDER — SUMATRIPTAN SUCCINATE 50 MG PO TABS
100.0000 mg | ORAL_TABLET | ORAL | Status: DC | PRN
  Filled 2019-07-23: qty 2

## 2019-07-23 MED ORDER — MONTELUKAST SODIUM 10 MG PO TABS: 10.0000 mg | ORAL_TABLET | Freq: Every day | ORAL | Status: DC

## 2019-07-23 MED ORDER — FAMOTIDINE 20 MG PO TABS: 20.0000 mg | ORAL_TABLET | Freq: Two times a day (BID) | ORAL | Status: DC

## 2019-07-23 MED ORDER — HYDROXYZINE HCL 25 MG PO TABS: 50.0000 mg | ORAL_TABLET | Freq: Four times a day (QID) | ORAL | Status: DC | PRN

## 2019-07-23 MED ORDER — GABAPENTIN 300 MG PO CAPS
600.0000 mg | ORAL_CAPSULE | Freq: Two times a day (BID) | ORAL | Status: DC
  Administered 2019-07-23 (×2): 600 mg via ORAL
  Filled 2019-07-23 (×2): qty 2

## 2019-07-23 MED ORDER — DIPHENHYDRAMINE HCL 50 MG/ML IJ SOLN
25.0000 mg | Freq: Four times a day (QID) | INTRAMUSCULAR | Status: DC
  Administered 2019-07-23 (×3): 25 mg via INTRAVENOUS
  Filled 2019-07-23 (×3): qty 1

## 2019-07-23 MED ORDER — ONDANSETRON 4 MG PO TBDP: 4.0000 mg | ORAL_TABLET | Freq: Three times a day (TID) | ORAL | Status: DC | PRN

## 2019-07-23 MED ORDER — FAMOTIDINE IN NACL 20-0.9 MG/50ML-% IV SOLN
20.0000 mg | Freq: Two times a day (BID) | INTRAVENOUS | Status: DC
  Administered 2019-07-23 (×2): 20 mg via INTRAVENOUS
  Filled 2019-07-23 (×2): qty 50

## 2019-07-23 MED ORDER — TOPIRAMATE 100 MG PO TABS
100.0000 mg | ORAL_TABLET | Freq: Two times a day (BID) | ORAL | Status: DC
  Administered 2019-07-23 (×2): 100 mg via ORAL
  Filled 2019-07-23 (×2): qty 1
  Filled 2019-07-23: qty 4
  Filled 2019-07-23: qty 1

## 2019-07-23 MED ORDER — EPINEPHRINE 0.3 MG/0.3ML IJ SOAJ: 0.3000 mg | INTRAMUSCULAR | 2 refills | Status: DC | PRN

## 2019-07-23 MED ORDER — QUETIAPINE FUMARATE 25 MG PO TABS
150.0000 mg | ORAL_TABLET | Freq: Every day | ORAL | Status: DC
  Administered 2019-07-23: 150 mg via ORAL
  Filled 2019-07-23 (×2): qty 6

## 2019-07-23 MED ORDER — EPINEPHRINE 0.3 MG/0.3ML IJ SOAJ
0.3000 mg | INTRAMUSCULAR | Status: DC | PRN
  Filled 2019-07-23: qty 0.3

## 2019-07-23 MED ORDER — SERTRALINE HCL 50 MG PO TABS
50.0000 mg | ORAL_TABLET | Freq: Every day | ORAL | Status: DC
  Administered 2019-07-23: 50 mg via ORAL
  Filled 2019-07-23: qty 1

## 2019-07-23 MED ORDER — ATORVASTATIN CALCIUM 20 MG PO TABS: 20.0000 mg | ORAL_TABLET | Freq: Every day | ORAL | Status: DC

## 2019-07-23 MED ORDER — QUETIAPINE FUMARATE 50 MG PO TABS: 150.0000 mg | ORAL_TABLET | Freq: Every day | ORAL | 0 refills | Status: DC

## 2019-07-23 MED ORDER — PREDNISONE 20 MG PO TABS: 50.0000 mg | ORAL_TABLET | Freq: Every day | ORAL | Status: DC

## 2019-07-23 MED ORDER — VITAMIN D (ERGOCALCIFEROL) 1.25 MG (50000 UNIT) PO CAPS
50000.0000 [IU] | ORAL_CAPSULE | ORAL | Status: DC
  Administered 2019-07-23: 50000 [IU] via ORAL
  Filled 2019-07-23 (×2): qty 1

## 2019-07-23 MED ORDER — ALBUTEROL SULFATE (2.5 MG/3ML) 0.083% IN NEBU: 2.5000 mg | INHALATION_SOLUTION | Freq: Four times a day (QID) | RESPIRATORY_TRACT | Status: DC | PRN

## 2019-07-23 MED ORDER — POTASSIUM CHLORIDE 20 MEQ PO PACK
40.0000 meq | PACK | Freq: Once | ORAL | Status: AC
  Administered 2019-07-23: 03:00:00 40 meq via ORAL
  Filled 2019-07-23: qty 2

## 2019-07-23 MED ORDER — BUTALBITAL-APAP-CAFFEINE 50-325-40 MG PO TABS: 1.0000 | ORAL_TABLET | Freq: Four times a day (QID) | ORAL | Status: DC | PRN

## 2019-07-23 MED ORDER — OXYCODONE-ACETAMINOPHEN 7.5-325 MG PO TABS
1.0000 | ORAL_TABLET | ORAL | Status: DC | PRN
  Administered 2019-07-23: 1 via ORAL
  Filled 2019-07-23: qty 1

## 2019-07-23 MED ORDER — ONDANSETRON HCL 4 MG/2ML IJ SOLN: 4.0000 mg | Freq: Four times a day (QID) | INTRAMUSCULAR | Status: DC | PRN

## 2019-07-23 MED ORDER — AMLODIPINE BESYLATE 10 MG PO TABS
10.0000 mg | ORAL_TABLET | Freq: Every day | ORAL | Status: DC
  Administered 2019-07-23: 10 mg via ORAL
  Filled 2019-07-23: qty 1

## 2019-07-23 MED ORDER — SODIUM CHLORIDE 0.9 % IV SOLN: INTRAVENOUS | Status: DC

## 2019-07-23 MED ORDER — TRAZODONE HCL 100 MG PO TABS
100.0000 mg | ORAL_TABLET | Freq: Every day | ORAL | Status: DC
  Administered 2019-07-23: 03:00:00 100 mg via ORAL
  Filled 2019-07-23: qty 1

## 2019-07-23 MED ORDER — ACETAMINOPHEN 325 MG PO TABS: 650.0000 mg | ORAL_TABLET | Freq: Four times a day (QID) | ORAL | Status: DC | PRN

## 2019-07-23 MED ORDER — PREDNISONE 10 MG PO TABS: 50.0000 mg | ORAL_TABLET | Freq: Every day | ORAL | 0 refills | Status: DC

## 2019-07-23 MED ORDER — ACETAMINOPHEN 650 MG RE SUPP: 650.0000 mg | Freq: Four times a day (QID) | RECTAL | Status: DC | PRN

## 2019-07-23 MED ORDER — BACLOFEN 10 MG PO TABS
10.0000 mg | ORAL_TABLET | Freq: Two times a day (BID) | ORAL | Status: DC
  Administered 2019-07-23: 10 mg via ORAL
  Filled 2019-07-23 (×4): qty 1

## 2019-07-23 NOTE — Progress Notes (Signed)
Ransom visited pt. per OR for AD education/completion.  Pt. lying down in bed w/hus. Derek in chair @ bedside.  Pt. and hus. slightly alarmed @ seeing chaplain before [facility] explained visit was routine. Pt. requested prayer for strength and healing.  Kiron asked if pt. needed info re: AD after rpayer --> pt. requested document --> will complete when she is less drowsy; hus. will be MPOA.  Pt. will page [facility] if/when ready to get signatures on doc. or if any questions arise.    07/23/19 1000  Clinical Encounter Type  Visited With Patient and family together  Visit Type Initial;Social support;Spiritual support;Psychological support (AD education)  Referral From Nurse  Consult/Referral To Chaplain  Spiritual Encounters  Spiritual Needs Emotional  Stress Factors  Patient Stress Factors Health changes;Major life changes

## 2019-07-23 NOTE — ED Provider Notes (Signed)
Patient monitored for 3.5 hrs post epi and with persistent lower lip swelling and L periorbital swelling. No intra-oral swelling. Airway is patent, no respiratory distress, no stridor. Will admit to Hospitalist per plan of Dr. Jimmye Norman. Patient does not feel comfortable going home with swelling not improving.    Alfred Levins, [place], MD 07/23/19 [phone]

## 2019-07-23 NOTE — Discharge Instructions (Signed)
Patient and Husband advised to discuss with Dr. Margarita Rana regarding referral to allergy immunology specialist as outpatient

## 2019-07-23 NOTE — Plan of Care (Signed)
The patient has been discharged. IV removed. Education has been performed.

## 2019-07-23 NOTE — ED Notes (Signed)
Pt currently sleeping in room, lights off and door closed. Call light in reach

## 2019-07-23 NOTE — H&P (Signed)
[place] [facility] [place] NAME: Kelly Byrd    MR#:  [id number]  DATE OF BIRTH:  12/12/1975  DATE OF ADMISSION:  07/22/2019  PRIMARY CARE PHYSICIAN: Charlott Rakes, MD   REQUESTING/REFERRING PHYSICIAN: Gonzella Lex, MD  CHIEF COMPLAINT:   Chief Complaint  Patient presents with  . Allergic Reaction    HISTORY OF PRESENT ILLNESS:  Kelly Byrd  is a 44 y.o. female with a known history of asthma, hypertension and migraine and previous history of angioedema, who presented to the emergency room with acute onset of lower lip swelling as well as left facial and periorbital swelling with a feeling of throat closing.  She has had angioedema with lisinopril about a year and a half ago when it was stopped at that time.  She denies any new medications detergents, soaps or lotions.  No cough or wheezing or rashes.  No nausea or vomiting or diarrhea.  No chest pain or palpitations.  No headache or dizziness or blurred vision.  When she came to the ER, blood pressure was 171/94 with otherwise normal vital signs.  Labs revealed hypokalemia of 3 with albumin of 3.4 and otherwise normal CMP.  CBC showed anemia close to her baseline.  Alcohol level was less than 10.  The patient was given 125 mg of IV Solu-Medrol and 50 mg of IV Benadryl.  She took her subcu epi pen at home.  Her throat swelling is improved however she continued to have left facial and lip swelling.  She will be admitted to an observation medically monitored bed for further evaluation and management.Marland Kitchen  PAST MEDICAL HISTORY:   Past Medical History:  Diagnosis Date  . Asthma   . Back pain   . GERD (gastroesophageal reflux disease)   . Hypertension   . Kidney stone   . Migraines   . Sickle cell trait ([place])     PAST SURGICAL HISTORY:   Past Surgical History:  Procedure Laterality Date  . CESAREAN SECTION     2    SOCIAL HISTORY:   Social History   Tobacco Use  . Smoking status:  Current Every Day Smoker    Packs/day: 0.25    Types: Cigarettes  . Smokeless tobacco: Never Used  Substance Use Topics  . Alcohol use: Yes    Alcohol/week: 1.0 standard drinks    Types: 1 Cans of beer per week    FAMILY HISTORY:   Family History  Problem Relation Age of Onset  . Hypertension Mother   . Hypertension Father   . Diabetes Mellitus II Maternal Grandmother   . Lupus Other     DRUG ALLERGIES:   Allergies  Allergen Reactions  . Ace Inhibitors Swelling  . Lisinopril     Swelling--caused the pt to stay in the hospital  . Peanuts [Peanut Oil]     Swelling--caused the pt to stay in the hospital    REVIEW OF SYSTEMS:   ROS As per history of present illness. All pertinent systems were reviewed above. Constitutional,  HEENT, cardiovascular, respiratory, GI, GU, musculoskeletal, neuro, psychiatric, endocrine,  integumentary and hematologic systems were reviewed and are otherwise  negative/unremarkable except for positive findings mentioned above in the HPI.   MEDICATIONS AT HOME:   Prior to Admission medications   Medication Sig Start Date End Date Taking? Authorizing Provider  albuterol (VENTOLIN HFA) 108 (90 Base) MCG/ACT inhaler Inhale 2 puffs into the lungs every 6 (six) hours as needed for wheezing or shortness  of breath (wheezing). 03/19/19  Yes Charlott Rakes, MD  amLODipine (NORVASC) 10 MG tablet Take 1 tablet (10 mg total) by mouth daily. 03/19/19  Yes Charlott Rakes, MD  atorvastatin (LIPITOR) 20 MG tablet Take 1 tablet (20 mg total) by mouth daily. 03/19/19  Yes Charlott Rakes, MD  baclofen (LIORESAL) 10 MG tablet Take 10 mg by mouth 2 (two) times daily. 07/16/19  Yes [provider]  butalbital-acetaminophen-caffeine (FIORICET) 50-325-40 MG tablet Take 1 tablet by mouth every 6 (six) hours as needed for headache. 02/13/19 02/13/20 Yes Laban Emperor, PA-C  celecoxib (CELEBREX) 100 MG capsule Take 100 mg by mouth daily. 07/16/19  Yes [provider]  diclofenac Sodium (VOLTAREN) 1 % GEL APPLY ONE HALF INCH TO AFFECTED AREA AS DIRECTED 07/16/19  Yes [provider]  diphenhydrAMINE (BENADRYL) 50 MG tablet Take 1 tablet (50 mg total) by mouth every 8 (eight) hours as needed for allergies. 06/27/19  Yes Patrecia Pour, MD  EPINEPHrine 0.3 mg/0.3 mL IJ SOAJ injection Inject 0.3 mLs (0.3 mg total) into the muscle as needed for anaphylaxis. 06/27/19  Yes Patrecia Pour, MD  famotidine (PEPCID) 20 MG tablet Take 1 tablet (20 mg total) by mouth 2 (two) times daily. 06/27/19 07/27/19 Yes Patrecia Pour, MD  gabapentin (NEURONTIN) 300 MG capsule Take 2 capsules (600 mg total) by mouth 2 (two) times daily. 03/19/19  Yes Charlott Rakes, MD  hydrOXYzine (ATARAX/VISTARIL) 50 MG tablet Take 1 tablet (50 mg total) by mouth every 6 (six) hours as needed for anxiety. 10/14/17  Yes Starkes-Perry, Gayland Curry, FNP  meloxicam (MOBIC) 7.5 MG tablet Take 1 tablet (7.5 mg total) by mouth daily. 12/18/18 12/18/19 Yes Paduchowski, Lennette Bihari, MD  montelukast (SINGULAIR) 10 MG tablet Take 1 tablet (10 mg total) by mouth at bedtime. 03/19/19  Yes Charlott Rakes, MD  ondansetron (ZOFRAN ODT) 4 MG disintegrating tablet Take 1 tablet (4 mg total) by mouth every 8 (eight) hours as needed for nausea or vomiting. 12/18/18  Yes Harvest Dark, MD  oxyCODONE-acetaminophen (PERCOCET) 7.5-325 MG tablet Take 1 tablet by mouth 3 (three) times daily as needed. 07/16/19  Yes [provider]  QUEtiapine (SEROQUEL) 50 MG tablet Take 1 tablet (50 mg total) by mouth at bedtime. Patient taking differently: Take 150 mg by mouth at bedtime.  10/14/17  Yes Starkes-Perry, Gayland Curry, FNP  sertraline (ZOLOFT) 50 MG tablet Take 1 tablet (50 mg total) by mouth daily. 10/15/17  Yes Starkes-Perry, Gayland Curry, FNP  SUMAtriptan (IMITREX) 100 MG tablet 100 mg orally at the onset of a migraine, may repeat in 2 hours if headache persists or recurs. Max 200mg  in 24 hrs 03/19/19  Yes Newlin, Charlane Ferretti,  MD  topiramate (TOPAMAX) 50 MG tablet Take 2 tablets (100 mg total) by mouth 2 (two) times daily. 03/19/19  Yes Charlott Rakes, MD  traZODone (DESYREL) 100 MG tablet Take 1 tablet (100 mg total) by mouth at bedtime. 10/14/17  Yes Starkes-Perry, Gayland Curry, FNP  Vitamin D, Ergocalciferol, (DRISDOL) 1.25 MG (50000 UNIT) CAPS capsule Take 50,000 Units by mouth once a week. (MONDAYS) 07/16/19  Yes [provider]  cephALEXin (KEFLEX) 500 MG capsule Take 1 capsule (500 mg total) by mouth 2 (two) times daily. Patient not taking: Reported on 07/22/2019 06/28/19   Charlott Rakes, MD      VITAL SIGNS:  Blood pressure (!) 122/58, pulse 84, temperature 98.5 F (36.9 C), temperature source Oral, resp. rate 16, height 5\' 4"  (1.626 m), weight 106.6 kg,  last menstrual period 07/05/2019, SpO2 98 %.  PHYSICAL EXAMINATION:  Physical Exam  GENERAL:  44 y.o.-year-old African-American female patient lying in the bed with no acute distress.  EYES: Pupils equal, round, reactive to light and accommodation. No scleral icterus. Extraocular muscles intact.  HEENT: Head atraumatic, normocephalic with left facial swelling.  Oropharynx with swollen lower lip and moist mucous membrane and tongue nose clear. NECK:  Supple, no jugular venous distention. No thyroid enlargement, no tenderness.  LUNGS: Normal breath sounds bilaterally, no wheezing, rales,rhonchi or crepitation. No use of accessory muscles of respiration.  CARDIOVASCULAR: Regular rate and rhythm, S1, S2 normal. No murmurs, rubs, or gallops.  ABDOMEN: Soft, nondistended, nontender. Bowel sounds present. No organomegaly or mass.  EXTREMITIES: No pedal edema, cyanosis, or clubbing.  NEUROLOGIC: Cranial nerves II through XII are intact. Muscle strength 5/5 in all extremities. Sensation intact. Gait not checked.  PSYCHIATRIC: The patient is alert and oriented x 3.  Normal affect and good eye contact. SKIN: No obvious rash, lesion, or ulcer.   LABORATORY PANEL:    CBC Recent Labs  Lab 07/22/19 2236  WBC 10.3  HGB 9.7*  HCT 32.4*  PLT 541*   ------------------------------------------------------------------------------------------------------------------  Chemistries  Recent Labs  Lab 07/22/19 2236  NA 138  K 3.0*  CL 106  CO2 25  GLUCOSE 109*  BUN 7  CREATININE 0.49  CALCIUM 8.4*  AST 11*  ALT 15  ALKPHOS 53  BILITOT 0.4   ------------------------------------------------------------------------------------------------------------------  Cardiac Enzymes No results for input(s): TROPONINI in the last 168 hours. ------------------------------------------------------------------------------------------------------------------  RADIOLOGY:  No results found.    IMPRESSION AND PLAN:   1.  Recurrent angioedema. -The patient will be admitted to the medical monitored observation bed. -We will continue IV steroids with Solu-Medrol as well as H1 and H2 blockers with IV Benadryl and Pepcid. -We will utilize subcutaneous epinephrine as needed.  2.  Hypokalemia. We will replace potassium and check magnesium level.  3.  Hypertension. -Continue Norvasc.  4.  Dyslipidemia -We will continue statin therapy.  5.  History of migraine. -We will continue as needed Fioricet.  6.  Asthma without exacerbation. -We will continue Singulair and as needed albuterol.  7.  DVT prophylaxis. -Subcutaneous Lovenox.  All the records are reviewed and case discussed with ED provider. The plan of care was discussed in details with the patient. I answered all questions. The patient agreed to proceed with the above mentioned plan. Further management will depend upon hospital course.   CODE STATUS: Full code  Status is: Observation  The patient remains OBS appropriate and will d/c before 2 midnights.  Dispo: The patient is from: Home              Anticipated d/c is to: Home              Anticipated d/c date is: 1 day              Patient  currently is not medically stable to d/c.   TOTAL TIME TAKING CARE OF THIS PATIENT: 50 minutes.    Christel Mormon M.D on 07/23/2019 at 2:01 AM  [facility]   From 7 PM-7 AM, contact night-coverage [email or web address]  CC: Primary care physician; Charlott Rakes, MD   Note: This dictation was prepared with Dragon dictation along with smaller phrase technology. Any transcriptional errors that result from this process are unintentional.

## 2019-07-23 NOTE — Discharge Summary (Addendum)
[facility] [place] NAME: Kelly Byrd    MR#:  [id number]  DATE OF BIRTH:  Feb 15, 1976  DATE OF ADMISSION:  07/22/2019 ADMITTING PHYSICIAN: Christel Mormon, MD  DATE OF DISCHARGE: 07/23/2019  PRIMARY CARE PHYSICIAN: Charlott Rakes, MD    ADMISSION DIAGNOSIS:  Angioedema [T78.3XXA] Angioedema, subsequent encounter [T78.3XXD]  DISCHARGE DIAGNOSIS:  Angioedema --unknown eitology  SECONDARY DIAGNOSIS:   Past Medical History:  Diagnosis Date  . Asthma   . Back pain   . GERD (gastroesophageal reflux disease)   . Hypertension   . Kidney stone   . Migraines   . Sickle cell trait [facility])     HOSPITAL COURSE:   Adeola Byrd  is a 44 y.o. female with a known history of asthma, hypertension and migraine and previous history of angioedema, who presented to the emergency room with acute onset of lower lip swelling as well as left facial and periorbital swelling with a feeling of throat closing.  She has had angioedema with lisinopril about a year and a half ago when it was stopped at that time.  She denies any new medications detergents, soaps or lotions.    1.  Recurrent angioedema. --recieved IV steroids with Solu-Medrol as well as H1 and H2 blockers with po Benadryl and Pepcid. -sleepy from IV benadryl -- no respiratory distress. Sats are 98% on room air. Hemodynamically otherwise stable. -- Adding PO diet -- had 80% of her meals. -We will utilize subcutaneous epinephrine as needed. -- Patient was recommended to keep food diary and follow-up with either ENT or allergy specialist in town. I don't think she is made the appointment -- spoke with patient's husband Kelly Byrd on the phone and readdressed the same concern to follow-up as soon as possible with allergy specialist or follow with ENT to determine what is causing her to have angioedema. She is advised to get referral thru her PCP. -- pt's husband did voice understanding. -- I have  called in extra prescription for EpiPen for patient to be kept at home.  2.  Hypokalemia. ,Potassium is 3.7  -Mag is 1.8   3.  Hypertension. -Continue Norvasc.  4.  Dyslipidemia  continue statin therapy.  5.  History of migraine. continue as needed Fioricet.  6.  Asthma without exacerbation. -continue Singulair and as needed albuterol.  7.  DVT prophylaxis. -Subcutaneous Lovenox.  8. Degenerative joint arthritis of  Hip -follows with orthopedic at [facility]  Overall hemodynamically stable. Discussed discharge plan with patient and patient's husband Kelly Byrd on the phone   CONSULTS OBTAINED:    DRUG ALLERGIES:   Allergies  Allergen Reactions  . Ace Inhibitors Swelling  . Lisinopril     Swelling--caused the pt to stay in the hospital  . Peanuts [Peanut Oil]     Swelling--caused the pt to stay in the hospital    DISCHARGE MEDICATIONS:   Allergies as of 07/23/2019      Reactions   Ace Inhibitors Swelling   Lisinopril    Swelling--caused the pt to stay in the hospital   Peanuts [peanut Oil]    Swelling--caused the pt to stay in the hospital      Medication List    STOP taking these medications   cephALEXin 500 MG capsule Commonly known as: KEFLEX   hydrOXYzine 50 MG tablet Commonly known as: ATARAX/VISTARIL   meloxicam 7.5 MG tablet Commonly known as: Mobic     TAKE these medications   albuterol 108 (  90 Base) MCG/ACT inhaler Commonly known as: VENTOLIN HFA Inhale 2 puffs into the lungs every 6 (six) hours as needed for wheezing or shortness of breath (wheezing).   amLODipine 10 MG tablet Commonly known as: NORVASC Take 1 tablet (10 mg total) by mouth daily.   atorvastatin 20 MG tablet Commonly known as: LIPITOR Take 1 tablet (20 mg total) by mouth daily.   baclofen 10 MG tablet Commonly known as: LIORESAL Take 10 mg by mouth 2 (two) times daily.   butalbital-acetaminophen-caffeine 50-325-40 MG tablet Commonly known as:  FIORICET Take 1 tablet by mouth every 6 (six) hours as needed for headache.   celecoxib 100 MG capsule Commonly known as: CELEBREX Take 100 mg by mouth daily.   diclofenac Sodium 1 % Gel Commonly known as: VOLTAREN APPLY ONE HALF INCH TO AFFECTED AREA AS DIRECTED   diphenhydrAMINE 50 MG tablet Commonly known as: BENADRYL Take 1 tablet (50 mg total) by mouth every 8 (eight) hours as needed for allergies.   EPINEPHrine 0.3 mg/0.3 mL Soaj injection Commonly known as: EPI-PEN Inject 0.3 mLs (0.3 mg total) into the muscle as needed for anaphylaxis.   famotidine 20 MG tablet Commonly known as: PEPCID Take 1 tablet (20 mg total) by mouth 2 (two) times daily.   gabapentin 300 MG capsule Commonly known as: NEURONTIN Take 2 capsules (600 mg total) by mouth 2 (two) times daily.   montelukast 10 MG tablet Commonly known as: SINGULAIR Take 1 tablet (10 mg total) by mouth at bedtime.   ondansetron 4 MG disintegrating tablet Commonly known as: Zofran ODT Take 1 tablet (4 mg total) by mouth every 8 (eight) hours as needed for nausea or vomiting.   oxyCODONE-acetaminophen 7.5-325 MG tablet Commonly known as: PERCOCET Take 1 tablet by mouth 3 (three) times daily as needed.   predniSONE 10 MG tablet Commonly known as: DELTASONE Take 5 tablets (50 mg total) by mouth daily with breakfast. Take 50 mg daily. Taper by 10 mg daily then stop. Start taking on: July 24, 2019   QUEtiapine 50 MG tablet Commonly known as: SEROQUEL Take 3 tablets (150 mg total) by mouth at bedtime.   sertraline 50 MG tablet Commonly known as: ZOLOFT Take 1 tablet (50 mg total) by mouth daily.   SUMAtriptan 100 MG tablet Commonly known as: Imitrex 100 mg orally at the onset of a migraine, may repeat in 2 hours if headache persists or recurs. Max 200mg  in 24 hrs   topiramate 50 MG tablet Commonly known as: Topamax Take 2 tablets (100 mg total) by mouth 2 (two) times daily.   traZODone 100 MG  tablet Commonly known as: DESYREL Take 1 tablet (100 mg total) by mouth at bedtime.   Vitamin D (Ergocalciferol) 1.25 MG (50000 UNIT) Caps capsule Commonly known as: DRISDOL Take 50,000 Units by mouth once a week. (MONDAYS)       If you experience worsening of your admission symptoms, develop shortness of breath, life threatening emergency, suicidal or homicidal thoughts you must seek medical attention immediately by calling 911 or calling your MD immediately  if symptoms less severe.  You Must read complete instructions/literature along with all the possible adverse reactions/side effects for all the Medicines you take and that have been prescribed to you. Take any new Medicines after you have completely understood and accept all the possible adverse reactions/side effects.   Please note  You were cared for by a hospitalist during your hospital stay. If you have any questions about your  discharge medications or the care you received while you were in the hospital after you are discharged, you can call the unit and asked to speak with the hospitalist on call if the hospitalist that took care of you is not available. Once you are discharged, your primary care physician will handle any further medical issues. Please note that NO REFILLS for any discharge medications will be authorized once you are discharged, as it is imperative that you return to your primary care physician (or establish a relationship with a primary care physician if you do not have one) for your aftercare needs so that they can reassess your need for medications and monitor your lab values. Today   SUBJECTIVE   Swelling is improving. I did eat food. No choking no shortness of breath. Complains of generalized body ache due to her arthritis  VITAL SIGNS:  Blood pressure (!) 164/70, pulse 86, temperature 98 F (36.7 C), temperature source Oral, resp. rate 17, height 5\' 4"  (1.626 m), weight 106.6 kg, last menstrual period  07/05/2019, SpO2 98 %.  I/O:    Intake/Output Summary (Last 24 hours) at 07/23/2019 1519 Last data filed at 07/23/2019 1403 Gross per 24 hour  Intake 240 ml  Output 300 ml  Net -60 ml    PHYSICAL EXAMINATION:  GENERAL:  44 y.o.-year-old patient lying in the bed with no acute distress.  EYES: Pupils equal, round, reactive to light and accommodation. No scleral icterus.  HEENT: Head atraumatic, normocephalic. Oropharynx and nasopharynx clear.  NECK:  Supple, no jugular venous distention. No thyroid enlargement, no tenderness.  LUNGS: Normal breath sounds bilaterally, no wheezing, rales,rhonchi or crepitation. No use of accessory muscles of respiration.  CARDIOVASCULAR: S1, S2 normal. No murmurs, rubs, or gallops.  ABDOMEN: Soft, non-tender, non-distended. Bowel sounds present. No organomegaly or mass.  EXTREMITIES: No pedal edema, cyanosis, or clubbing.  NEUROLOGIC: Cranial nerves II through XII are intact. Muscle strength 5/5 in all extremities. Sensation intact. Gait not checked.  PSYCHIATRIC: The patient is alert and oriented x 3.  SKIN: No obvious rash, lesion, or ulcer.   DATA REVIEW:   CBC  Recent Labs  Lab 07/23/19 0509  WBC 10.8*  HGB 10.0*  HCT 34.2*  PLT 526*    Chemistries  Recent Labs  Lab 07/22/19 2236 07/22/19 2236 07/23/19 0509  NA 138   < > 136  K 3.0*   < > 3.7  CL 106   < > 105  CO2 25   < > 21*  GLUCOSE 109*   < > 258*  BUN 7   < > 7  CREATININE 0.49   < > 0.80  CALCIUM 8.4*   < > 8.8*  MG  --   --  1.8  AST 11*  --   --   ALT 15  --   --   ALKPHOS 53  --   --   BILITOT 0.4  --   --    < > = values in this interval not displayed.    Microbiology Results   Recent Results (from the past 240 hour(s))  Respiratory Panel by RT PCR (Flu A&B, Covid) - Nasopharyngeal Swab     Status: None   Collection Time: 07/23/19  3:27 AM   Specimen: Nasopharyngeal Swab  Result Value Ref Range Status   SARS Coronavirus 2 by RT PCR NEGATIVE NEGATIVE Final     Comment: (NOTE) SARS-CoV-2 target nucleic acids are NOT DETECTED. The SARS-CoV-2 RNA is generally detectable in  upper respiratoy specimens during the acute phase of infection. The lowest concentration of SARS-CoV-2 viral copies this assay can detect is 131 copies/mL. A negative result does not preclude SARS-Cov-2 infection and should not be used as the sole basis for treatment or other patient management decisions. A negative result may occur with  improper specimen collection/handling, submission of specimen other than nasopharyngeal swab, presence of viral mutation(s) within the areas targeted by this assay, and inadequate number of viral copies (<131 copies/mL). A negative result must be combined with clinical observations, patient history, and epidemiological information. The expected result is Negative. Fact Sheet for Patients:  PinkCheek.be Fact Sheet for Healthcare Providers:  GravelBags.it This test is not yet ap proved or cleared by the [place] FDA and  has been authorized for detection and/or diagnosis of SARS-CoV-2 by FDA under an Emergency Use Authorization (EUA). This EUA will remain  in effect (meaning this test can be used) for the duration of the COVID-19 declaration under Section 564(b)(1) of the Act, 21 U.S.C. section 360bbb-3(b)(1), unless the authorization is terminated or revoked sooner.    Influenza A by PCR NEGATIVE NEGATIVE Final   Influenza B by PCR NEGATIVE NEGATIVE Final    Comment: (NOTE) The Xpert Xpress SARS-CoV-2/FLU/RSV assay is intended as an aid in  the diagnosis of influenza from Nasopharyngeal swab specimens and  should not be used as a sole basis for treatment. Nasal washings and  aspirates are unacceptable for Xpert Xpress SARS-CoV-2/FLU/RSV  testing. Fact Sheet for Patients: PinkCheek.be Fact Sheet for Healthcare  Providers: GravelBags.it This test is not yet approved or cleared by the [place] FDA and  has been authorized for detection and/or diagnosis of SARS-CoV-2 by  FDA under an Emergency Use Authorization (EUA). This EUA will remain  in effect (meaning this test can be used) for the duration of the  Covid-19 declaration under Section 564(b)(1) of the Act, 21  U.S.C. section 360bbb-3(b)(1), unless the authorization is  terminated or revoked. Performed at [facility], [address], [place], [place] [address]     RADIOLOGY:  No results found.   CODE STATUS:     Code Status Orders  (From admission, onward)         Start     Ordered   07/23/19 0148  Full code  Continuous     07/23/19 0155        Code Status History    Date Active Date Inactive Code Status Order ID Comments User Context   06/25/2019 1248 06/27/2019 2247 Full Code [id number]  Ivor Costa, MD ED   10/11/2017 1739 10/14/2017 2036 Full Code [id number]  Patrecia Pour, NP Inpatient   10/10/2017 2212 10/11/2017 1643 Full Code [id number]  Duffy Bruce, MD ED   04/23/2017 2056 04/24/2017 1752 Full Code [id number]  Lavonia Drafts, MD ED   06/15/2015 0223 06/16/2015 1754 Full Code [id number]  Saundra Shelling, MD Inpatient   10/27/2014 0247 10/29/2014 1640 Full Code [id number]  Harrie Foreman, MD Inpatient   Advance Care Planning Activity       TOTAL TIME TAKING CARE OF THIS PATIENT: *40* minutes.    Fritzi Mandes M.D  [facility]    CC: Primary care physician; Charlott Rakes, MD

## 2019-07-23 NOTE — ED Notes (Signed)
Pt states that she no longer feels as if her tongue is swallow, and she feels like she is able to swallow. Swelling on left side of face still noted. Pt given apple juice and graham crackers with permission from MD Alfred Levins.

## 2019-07-24 ENCOUNTER — Telehealth: Payer: Self-pay

## 2019-07-24 NOTE — Telephone Encounter (Signed)
Transition Care Management Follow-up Telephone Call Date of discharge and from where: 07/23/2019, [facility]  Call placed to patient # [phone], her husband answered and he was not with her.  He said that he would have her return the call to this CM

## 2019-07-25 ENCOUNTER — Telehealth: Payer: Self-pay

## 2019-07-25 NOTE — Telephone Encounter (Signed)
Transition Care Management Follow-up Telephone Call Date of discharge and from where: 07/23/2019, [facility]   Call placed to patient # [phone], voicemail full.  Unable to leave a message.   Patient has appt at [facility] 08/02/2019

## 2019-07-30 NOTE — Progress Notes (Deleted)
Patient ID: Kelly Byrd, female    DOB: [birth year]  MRN: [id number]  CC: Hospital Follow-up  Subjective: Kelly Byrd is a 44 y.o. 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  Last visit at [facility] with Dr. Sidney Ace and Dr. Posey Pronto on 07/22/2019. During that encounter patient presented to the emergency room with acute onset of lower lip swelling as well as left facial and periorbital swelling with a feeling of throat closing. She had angioedema with Lisinopril about a year and a half ago when it was stopped at that time. She denied any new medications, detergents, soaps, and lotions. No cough, wheezing, or rashes. No vomiting or diarrhea. No chest pain of palpitations. No headache or dizziness or blurred vision.  When she presented to the ER, blood pressure was 171/9 with otherwise normal vital signs. Labs revealed hypokalemia of 3 with albumin of 3.4 and otherwise normal CMP. CBC showed anemia close to her baseline. Alcohol level was less than 10.   Patient was given 125 mg of IV Solu-Medrol and 50 mg of IV Benadryl. She took her subcutaneous EpiPen at home. Her throat swelling is improved but she continued to have left facial and lip swelling. Patient was admitted to an observation medically monitored bed for further evaluation and management.  The plan for recurrent angioedema was to be admitted to the monitored observation bed, continued on IV steroids with Solu-Medrol as well as H1 and H2 blockers with IV Benadryl and Pepcid with subcutaneous Epinephrine used as needed. While admitted patient was not in  respiratory distress, saturation of 98% on room air and hemodynamically stable. Patient had a meal by mouth and able to consume 80% of meal. It was recommended patient keep a food diary and follow-up with ENT or allergy specialist to determine what is causing her to have angioedema and advised to get referral through PCP. An extra prescription for EpiPen was prescribed.  The plan for hypokalemia was to replace potassium and check magnesium level.   The plan for hypertension was to continue Norvasc.  The plan for dyslipidemia was to continue statin therapy.   The plan for history of migraine was to continue as needed Fioricet.   The pan for asthma without exacerbation was to continue Singulair and as needed albuterol.   The plan for DVT prophylaxis was subcutaneous Lovenox.   The plan for degenerative joint arthritis of hip was to follow-up with Orthopedics.    1. ANGIOEDEMA FOLLOW-UP:   Patient Active Problem List   Diagnosis Date Noted  . Angioedema 06/26/2019  . Facial swelling   . Allergic reaction 06/25/2019  . Hypokalemia 06/25/2019  . Abdominal pain 06/25/2019  . Major depressive disorder, recurrent severe without psychotic features ([place]) 10/11/2017  . Cocaine abuse ([place]) 10/11/2017  . Lumbar radiculopathy 05/31/2017  . Hyperlipidemia 05/31/2017  . GERD (gastroesophageal reflux disease) 08/05/2016  . Fibroids 09/19/2015  . Hypertension 08/29/2015  . Asthma 08/29/2015  . Back pain 08/29/2015  . 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 Anemia, iron deficiency 10/29/2014  . Menometrorrhagia 10/29/2014  . Migraine 10/27/2014     Current Outpatient Medications on File Prior to Visit  Medication Sig Dispense Refill  . albuterol (VENTOLIN HFA) 108 (90 Base) MCG/ACT inhaler Inhale  2 puffs into the lungs every 6 (six) hours as needed for wheezing or shortness of breath (wheezing). 18 g 3  . amLODipine (NORVASC) 10 MG tablet Take 1 tablet (10 mg total) by mouth daily.  30 tablet 3  . atorvastatin (LIPITOR) 20 MG tablet Take 1 tablet (20 mg total) by mouth daily. 30 tablet 3  . baclofen (LIORESAL) 10 MG tablet Take 10 mg by mouth 2 (two) times daily.    . butalbital-acetaminophen-caffeine (FIORICET) 50-325-40 MG tablet Take 1 tablet by mouth every 6 (six) hours as needed for headache. 12 tablet 0  . celecoxib (CELEBREX) 100 MG capsule Take 100 mg by mouth daily.    . diclofenac Sodium (VOLTAREN) 1 % GEL APPLY ONE HALF INCH TO AFFECTED AREA AS DIRECTED    . diphenhydrAMINE (BENADRYL) 50 MG tablet Take 1 tablet (50 mg total) by mouth every 8 (eight) hours as needed for allergies. 30 tablet 0  . EPINEPHrine 0.3 mg/0.3 mL IJ SOAJ injection Inject 0.3 mLs (0.3 mg total) into the muscle as needed for anaphylaxis. 2 each 2  . famotidine (PEPCID) 20 MG tablet Take 1 tablet (20 mg total) by mouth 2 (two) times daily. 60 tablet 0  . gabapentin (NEURONTIN) 300 MG capsule Take 2 capsules (600 mg total) by mouth 2 (two) times daily. 120 capsule 3  . montelukast (SINGULAIR) 10 MG tablet Take 1 tablet (10 mg total) by mouth at bedtime. 30 tablet 3  . ondansetron (ZOFRAN ODT) 4 MG disintegrating tablet Take 1 tablet (4 mg total) by mouth every 8 (eight) hours as needed for nausea or vomiting. 20 tablet 0  . oxyCODONE-acetaminophen (PERCOCET) 7.5-325 MG tablet Take 1 tablet by mouth 3 (three) times daily as needed.    . predniSONE (DELTASONE) 10 MG tablet Take 5 tablets (50 mg total) by mouth daily with breakfast. Take 50 mg daily. Taper by 10 mg daily then stop. 15 tablet 0  . QUEtiapine (SEROQUEL) 50 MG tablet Take 3 tablets (150 mg total) by mouth at bedtime. 20 tablet 0  . sertraline (ZOLOFT) 50 MG tablet Take 1 tablet (50 mg total) by mouth daily. 30 tablet 0  . SUMAtriptan (IMITREX) 100 MG tablet 100 mg orally at the onset of a migraine, may repeat in 2 hours if headache persists or recurs. Max 200mg  in 24 hrs 30 tablet 1  . topiramate (TOPAMAX) 50 MG tablet Take 2 tablets  (100 mg total) by mouth 2 (two) times daily. 60 tablet 3  . traZODone (DESYREL) 100 MG tablet Take 1 tablet (100 mg total) by mouth at bedtime. 30 tablet 0  . Vitamin D, Ergocalciferol, (DRISDOL) 1.25 MG (50000 UNIT) CAPS capsule Take 50,000 Units by mouth once a week. (MONDAYS)     No current facility-administered medications on file prior to visit.    Allergies  Allergen Reactions  . Ace Inhibitors Swelling  . Lisinopril     Swelling--caused the pt to stay in the hospital  . Peanuts [Peanut Oil]     Swelling--caused the pt to stay in the hospital    Social History   Socioeconomic History  . Marital status: Married    Spouse name: Not on file  . Number of children: Not on file  . Years of education: Not on file  . Highest education level: Not on file  Occupational History  . Occupation: disabled  Tobacco Use  . Smoking status: Current Every Day Smoker    Packs/day: 0.25    Types: Cigarettes  .  Smokeless tobacco: Never Used  Substance and Sexual Activity  . Alcohol use: Yes    Alcohol/week: 1.0 standard drinks    Types: 1 Cans of beer per week  . Drug use: No  . Sexual activity: Yes    Birth control/protection: Surgical  Other Topics Concern  . Not on file  Social History Narrative  . Not on file   Social Determinants of Health   Financial Resource Strain:   . Difficulty of Paying Living Expenses:   Food Insecurity:   . Worried About Charity fundraiser in the Last Year:   . Arboriculturist in the Last Year:   Transportation Needs:   . Film/video editor (Medical):   Marland Kitchen Lack of Transportation (Non-Medical):   Physical Activity:   . Days of Exercise per Week:   . Minutes of Exercise per Session:   Stress:   . Feeling of Stress :   Social Connections:   . Frequency of Communication with Friends and Family:   . Frequency of Social Gatherings with Friends and Family:   . Attends Religious Services:   . Active Member of Clubs or Organizations:   . Attends  Archivist Meetings:   Marland Kitchen Marital Status:   Intimate Partner Violence:   . Fear of Current or Ex-Partner:   . Emotionally Abused:   Marland Kitchen Physically Abused:   . Sexually Abused:     Family History  Problem Relation Age of Onset  . Hypertension Mother   . Hypertension Father   . Diabetes Mellitus II Maternal Grandmother   . Lupus Other     Past Surgical History:  Procedure Laterality Date  . CESAREAN SECTION     2    ROS: Review of Systems Negative except as stated above  PHYSICAL EXAM: LMP 07/05/2019 (Exact Date)   Physical Exam  {female adult master:310786} {female adult master:310785}  CMP Latest Ref Rng & Units 07/23/2019 07/22/2019 07/09/2019  Glucose 70 - 99 mg/dL 258(H) 109(H) 140(H)  BUN 6 - 20 mg/dL 7 7 9   Creatinine 0.44 - 1.00 mg/dL 0.80 0.49 0.61  Sodium 135 - 145 mmol/L 136 138 138  Potassium 3.5 - 5.1 mmol/L 3.7 3.0(L) 3.0(L)  Chloride 98 - 111 mmol/L 105 106 107  CO2 22 - 32 mmol/L 21(L) 25 24  Calcium 8.9 - 10.3 mg/dL 8.8(L) 8.4(L) 9.1  Total Protein 6.5 - 8.1 g/dL - 6.8 -  Total Bilirubin 0.3 - 1.2 mg/dL - 0.4 -  Alkaline Phos 38 - 126 U/L - 53 -  AST 15 - 41 U/L - 11(L) -  ALT 0 - 44 U/L - 15 -   Lipid Panel     Component Value Date/Time   CHOL 177 10/14/2017 0619   CHOL 193 08/06/2016 0840   TRIG 106 10/14/2017 0619   HDL 45 10/14/2017 0619   HDL 44 08/06/2016 0840   CHOLHDL 3.9 10/14/2017 0619   VLDL 21 10/14/2017 0619   LDLCALC 111 (H) 10/14/2017 0619   LDLCALC 132 (H) 08/06/2016 0840    CBC    Component Value Date/Time   WBC 10.8 (H) 07/23/2019 0509   RBC 4.99 07/23/2019 0509   HGB 10.0 (L) 07/23/2019 0509   HCT 34.2 (L) 07/23/2019 0509   PLT 526 (H) 07/23/2019 0509   MCV 68.5 (L) 07/23/2019 0509   MCH 20.0 (L) 07/23/2019 0509   MCHC 29.2 (L) 07/23/2019 0509   RDW 18.7 (H) 07/23/2019 0509   LYMPHSABS 3.2 07/22/2019  2236   MONOABS 0.5 07/22/2019 2236   EOSABS 0.5 07/22/2019 2236   BASOSABS 0.0 07/22/2019 2236     ASSESSMENT AND PLAN:  There are no diagnoses linked to this encounter.   Patient was given the opportunity to ask questions.  Patient verbalized understanding of the plan and was able to repeat key elements of the plan.   No orders of the defined types were placed in this encounter.    Requested Prescriptions    No prescriptions requested or ordered in this encounter    No follow-ups on file.  Camillia Herter, NP

## 2019-08-02 ENCOUNTER — Ambulatory Visit: Payer: Medicaid Other | Admitting: Family

## 2019-08-08 ENCOUNTER — Inpatient Hospital Stay: Payer: Medicaid Other

## 2019-09-03 ENCOUNTER — Encounter: Payer: Self-pay | Admitting: *Deleted

## 2019-09-03 ENCOUNTER — Other Ambulatory Visit: Payer: Self-pay

## 2019-09-03 DIAGNOSIS — Z5321 Procedure and treatment not carried out due to patient leaving prior to being seen by health care provider: Secondary | ICD-10-CM | POA: Diagnosis not present

## 2019-09-03 DIAGNOSIS — R109 Unspecified abdominal pain: Secondary | ICD-10-CM | POA: Diagnosis present

## 2019-09-03 LAB — COMPREHENSIVE METABOLIC PANEL
ALT: 13 U/L (ref 0–44)
AST: 14 U/L — ABNORMAL LOW (ref 15–41)
Albumin: 4 g/dL (ref 3.5–5.0)
Alkaline Phosphatase: 59 U/L (ref 38–126)
Anion gap: 10 (ref 5–15)
BUN: 9 mg/dL (ref 6–20)
CO2: 25 mmol/L (ref 22–32)
Calcium: 9.3 mg/dL (ref 8.9–10.3)
Chloride: 101 mmol/L (ref 98–111)
Creatinine, Ser: 0.73 mg/dL (ref 0.44–1.00)
GFR calc Af Amer: >60 mL/min (ref >60)
GFR calc non Af Amer: >60 mL/min (ref >60)
Glucose, Bld: 106 mg/dL — ABNORMAL HIGH (ref 70–99)
Potassium: 3.3 mmol/L — ABNORMAL LOW (ref 3.5–5.1)
Sodium: 136 mmol/L (ref 135–145)
Total Bilirubin: 0.6 mg/dL (ref 0.3–1.2)
Total Protein: 7.8 g/dL (ref 6.5–8.1)

## 2019-09-03 LAB — CBC WITH DIFFERENTIAL/PLATELET
Abs Immature Granulocytes: 0.07 10*3/uL (ref 0.00–0.07)
Basophils Absolute: 0.1 10*3/uL (ref 0.0–0.1)
Basophils Relative: 0 %
Eosinophils Absolute: 0.6 10*3/uL — ABNORMAL HIGH (ref 0.0–0.5)
Eosinophils Relative: 3 %
HCT: 37 % (ref 36.0–46.0)
Hemoglobin: 10.8 g/dL — ABNORMAL LOW (ref 12.0–15.0)
Immature Granulocytes: 0 %
Lymphocytes Relative: 18 %
Lymphs Abs: 3.1 10*3/uL (ref 0.7–4.0)
MCH: 19.5 pg — ABNORMAL LOW (ref 26.0–34.0)
MCHC: 29.2 g/dL — ABNORMAL LOW (ref 30.0–36.0)
MCV: 66.9 fL — ABNORMAL LOW (ref 80.0–100.0)
Monocytes Absolute: 0.7 10*3/uL (ref 0.1–1.0)
Monocytes Relative: 4 %
Neutro Abs: 12.8 10*3/uL — ABNORMAL HIGH (ref 1.7–7.7)
Neutrophils Relative %: 75 %
Platelets: 499 10*3/uL — ABNORMAL HIGH (ref 150–400)
RBC: 5.53 MIL/uL — ABNORMAL HIGH (ref 3.87–5.11)
RDW: 21.2 % — ABNORMAL HIGH (ref 11.5–15.5)
Smear Review: NORMAL
WBC: 17.4 10*3/uL — ABNORMAL HIGH (ref 4.0–10.5)
nRBC: 0 % (ref 0.0–0.2)

## 2019-09-03 NOTE — ED Triage Notes (Signed)
Pt reports low abd pain with cramps.  Menses now. Pt states she has uterine fibroids.  Denies urinary sx.  Pt alert  Speech clear.

## 2019-09-04 ENCOUNTER — Ambulatory Visit: Payer: Medicaid Other | Admitting: Family Medicine

## 2019-09-04 ENCOUNTER — Telehealth: Payer: Self-pay | Admitting: Emergency Medicine

## 2019-09-04 ENCOUNTER — Emergency Department
Admission: EM | Admit: 2019-09-04 | Discharge: 2019-09-04 | Disposition: A | Discharge status: Home / Self Care | Payer: Medicaid Other | Attending: Emergency Medicine | Admitting: Emergency Medicine

## 2019-09-04 LAB — POCT PREGNANCY, URINE: Preg Test, Ur: NEGATIVE

## 2019-09-04 NOTE — Telephone Encounter (Signed)
Called patient due to lwot to inquire about condition and follow up plans. Left message.   
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
## 2019-09-19 ENCOUNTER — Ambulatory Visit (INDEPENDENT_AMBULATORY_CARE_PROVIDER_SITE_OTHER): Payer: Medicaid Other | Admitting: Obstetrics and Gynecology

## 2019-09-19 ENCOUNTER — Other Ambulatory Visit: Payer: Self-pay

## 2019-09-19 ENCOUNTER — Encounter: Payer: Self-pay | Admitting: Obstetrics and Gynecology

## 2019-09-19 VITALS — BP 140/98 | Ht 64.0 in | Wt 236.0 lb

## 2019-09-19 DIAGNOSIS — N939 Abnormal uterine and vaginal bleeding, unspecified: Secondary | ICD-10-CM

## 2019-09-19 NOTE — Progress Notes (Signed)
Gynecology Abnormal Uterine Bleeding Initial Evaluation   Chief Complaint:     Chief Complaint  Patient presents with  . Consult    History of Present Illness:    Kelly Byrd is a 44 y.o. G3P3003 who LMP was Patient's last menstrual period was 09/03/2019., presents today for a problem visit.  She complains of menorrhagia that  began several years ago and its severity is described as severe.  The patient menstrual complaints are chronic present for the past 6 monthsShe has regular periods every month and they are associated with severe menstrual cramping.  She has used the following for attempts at control: tampon and pad. The patient is sexually active. She currently uses tubal ligationfor contraception.  Associated symptoms pelvic pressure, urinary frequency, and intermittent back pain.     Previous evaluation: reports ultrasound in [place] revealed uterine fibroids.  She does have a history of 2 previous cesarean section once which required reoperation. No prior mention of adenomyosis per patient. Prior Diagnosis: uterine fibroids.  These have been documented as long ago as 03/17/2010 CT [facility] showing 5.6cm degenerating submucosal fibroid, as well as multicystic lesion arising from the right ovary. Previous Treatment: none.  LMP: Patient's last menstrual period was 09/03/2019.  Paramter Normal / Abnormal Prsent  Frequency Am[place]hea     Infrequent (>38 days)     Normal (?24 days ?38 days) X   Freequent (<24 days)    Duration Normal (?8 days) X   Prolonged (>8 days)    Regularity Regular (shortest to longest cycle variation ?7-9 days)* X   Irregular (shortest to longest cycle variation ?8-10days)*    Flow Volume Light    (Self reported) Normal     Heavy X      Intermenstrual Bleeding None X   Random     Cyclical early     Cyclical mid     Cyclical late        Unscheduled Bleeding  Not applicable X  (exogenous hormones) Absent     Present       FIGO AUB I System: *The available evidence suggests that, using these criteria, the normal range (shortest to longest) varies with age: 27-25 y of age, ?24 d; 65-41 y, ?7 d; and for 89-45 y, ?9 d    Review of Systems: ROS  Past Medical History:      Patient Active Problem List   Diagnosis Date Noted  . Angioedema 06/26/2019  . Facial swelling   . Allergic reaction 06/25/2019  . Hypokalemia 06/25/2019  . Abdominal pain 06/25/2019  . Major depressive disorder, recurrent severe without psychotic features ([place]) 10/11/2017  . Cocaine abuse ([place]) 10/11/2017  . Lumbar radiculopathy 05/31/2017  . Hyperlipidemia 05/31/2017  . GERD (gastroesophageal reflux disease) 08/05/2016  . Fibroids 09/19/2015  . Hypertension 08/29/2015  . Asthma 08/29/2015  . Back pain 08/29/2015  . 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 Anemia, iron deficiency 10/29/2014  . Menometrorrhagia 10/29/2014  . Migraine 10/27/2014    Past Surgical History:       Past Surgical History:  Procedure Laterality Date  . CESAREAN SECTION     2    Obstetric History: [id number]  Family History:       Family History  Problem Relation Age of Onset  . Hypertension Mother   . Hypertension Father   . Diabetes Mellitus II Maternal Grandmother   . Lupus Other     Social History:  Social History  Socioeconomic History  . Marital status: Married    Spouse name: Not on file  . Number of children: Not on file  . Years of education: Not on file  . Highest education level: Not on file  Occupational History  . Occupation: disabled  Tobacco Use  . Smoking status: Current Every Day Smoker    Packs/day: 0.25    Types: Cigarettes  . Smokeless tobacco: Never Used  Substance and Sexual Activity  . Alcohol use: Yes    Alcohol/week: 1.0 standard drink    Types: 1 Cans of beer per week  . Drug use: No  . Sexual activity: Yes    Birth control/protection: Surgical  Other  Topics Concern  . Not on file  Social History Narrative  . Not on file   Social Determinants of Health      Financial Resource Strain:   . Difficulty of Paying Living Expenses:   Food Insecurity:   . Worried About Charity fundraiser in the Last Year:   . Arboriculturist in the Last Year:   Transportation Needs:   . Film/video editor (Medical):   Marland Kitchen Lack of Transportation (Non-Medical):   Physical Activity:   . Days of Exercise per Week:   . Minutes of Exercise per Session:   Stress:   . Feeling of Stress :   Social Connections:   . Frequency of Communication with Friends and Family:   . Frequency of Social Gatherings with Friends and Family:   . Attends Religious Services:   . Active Member of Clubs or Organizations:   . Attends Archivist Meetings:   Marland Kitchen Marital Status:   Intimate Partner Violence:   . Fear of Current or Ex-Partner:   . Emotionally Abused:   Marland Kitchen Physically Abused:   . Sexually Abused:     Allergies:       Allergies  Allergen Reactions  . Ace Inhibitors Swelling  . Lisinopril     Swelling--caused the pt to stay in the hospital  . Peanuts [Peanut Oil]     Swelling--caused the pt to stay in the hospital    Medications:        Prior to Admission medications   Medication Sig Start Date End Date Taking? Authorizing Provider  albuterol (VENTOLIN HFA) 108 (90 Base) MCG/ACT inhaler Inhale 2 puffs into the lungs every 6 (six) hours as needed for wheezing or shortness of breath (wheezing). 03/19/19  Yes Charlott Rakes, MD  amLODipine (NORVASC) 10 MG tablet Take 1 tablet (10 mg total) by mouth daily. 03/19/19  Yes Charlott Rakes, MD  atorvastatin (LIPITOR) 20 MG tablet Take 1 tablet (20 mg total) by mouth daily. 03/19/19  Yes Charlott Rakes, MD  baclofen (LIORESAL) 10 MG tablet Take 10 mg by mouth 2 (two) times daily. 07/16/19  Yes [provider]  butalbital-acetaminophen-caffeine (FIORICET) 50-325-40 MG tablet Take 1  tablet by mouth every 6 (six) hours as needed for headache. 02/13/19 02/13/20 Yes Laban Emperor, PA-C  celecoxib (CELEBREX) 100 MG capsule Take 100 mg by mouth daily. 07/16/19  Yes [provider]  diclofenac Sodium (VOLTAREN) 1 % GEL APPLY ONE HALF INCH TO AFFECTED AREA AS DIRECTED 07/16/19  Yes [provider]  diphenhydrAMINE (BENADRYL) 50 MG tablet Take 1 tablet (50 mg total) by mouth every 8 (eight) hours as needed for allergies. 06/27/19  Yes Patrecia Pour, MD  EPINEPHrine 0.3 mg/0.3 mL IJ SOAJ injection Inject 0.3 mLs (0.3 mg total) into the  muscle as needed for anaphylaxis. 07/23/19  Yes Fritzi Mandes, MD  gabapentin (NEURONTIN) 300 MG capsule Take 2 capsules (600 mg total) by mouth 2 (two) times daily. 03/19/19  Yes Newlin, Charlane Ferretti, MD  montelukast (SINGULAIR) 10 MG tablet Take 1 tablet (10 mg total) by mouth at bedtime. 03/19/19  Yes Charlott Rakes, MD  ondansetron (ZOFRAN ODT) 4 MG disintegrating tablet Take 1 tablet (4 mg total) by mouth every 8 (eight) hours as needed for nausea or vomiting. 12/18/18  Yes Harvest Dark, MD  oxyCODONE-acetaminophen (PERCOCET) 7.5-325 MG tablet Take 1 tablet by mouth 3 (three) times daily as needed. 07/16/19  Yes [provider]  predniSONE (DELTASONE) 10 MG tablet Take 5 tablets (50 mg total) by mouth daily with breakfast. Take 50 mg daily. Taper by 10 mg daily then stop. 07/24/19  Yes Fritzi Mandes, MD  QUEtiapine (SEROQUEL) 50 MG tablet Take 3 tablets (150 mg total) by mouth at bedtime. 07/23/19  Yes Fritzi Mandes, MD  sertraline (ZOLOFT) 50 MG tablet Take 1 tablet (50 mg total) by mouth daily. 10/15/17  Yes Starkes-Perry, Gayland Curry, FNP  SUMAtriptan (IMITREX) 100 MG tablet 100 mg orally at the onset of a migraine, may repeat in 2 hours if headache persists or recurs. Max 200mg  in 24 hrs 03/19/19  Yes Newlin, Charlane Ferretti, MD  topiramate (TOPAMAX) 50 MG tablet Take 2 tablets (100 mg total) by mouth 2 (two) times daily. 03/19/19   Yes Charlott Rakes, MD  traZODone (DESYREL) 100 MG tablet Take 1 tablet (100 mg total) by mouth at bedtime. 10/14/17  Yes Starkes-Perry, Gayland Curry, FNP  Vitamin D, Ergocalciferol, (DRISDOL) 1.25 MG (50000 UNIT) CAPS capsule Take 50,000 Units by mouth once a week. (MONDAYS) 07/16/19  Yes [provider]  famotidine (PEPCID) 20 MG tablet Take 1 tablet (20 mg total) by mouth 2 (two) times daily. 06/27/19 07/27/19  Patrecia Pour, MD    Physical Exam Blood pressure (!) 140/98, height 5\' 4"  (1.626 m), weight 236 lb (107 kg), last menstrual period 09/03/2019.  Patient's last menstrual period was 09/03/2019.  General: NAD HEENT: normocephalic, anicteric Pulmonary: No increased work of breathing Neurologic: Grossly intact Psychiatric: mood appropriate, affect full  Female chaperone present for pelvic portions of the physical exam  Assessment: 44 y.o. [phone] with abnormal uterine bleeding  Plan:  1) Discussed management options for abnormal uterine bleeding including expectant, NSAIDs, tranexamic acid (Lysteda), oral progesterone (Provera, norethindrone, megace), Depo Provera, Levonorgestrel containing IUD, endometrial ablation (Novasure) or hysterectomy as definitive surgical management.  Discussed risks and benefits of each method.   Final management decision will hinge on results of patient's work up and whether an underlying etiology for the patients bleeding symptoms can be discerned.  We will conduct a basic work up examining using the PALM-COIEN classification system.  The role of unopposed estrogen in the development of endometrial hyperplasia or carcinoma is discussed.  The risk of endometrial hyperplasia is linearly correlated with increasing BMI given the production of estrone by adipose tissue. Printed patient education handouts were given to the patient to review at home.  Bleeding precautions reviewed.  - patient report was told she had uterine fibroid in [place] - will  obtain pap and endometrial biopsy at next visit - if fibroids desire definitive surgical management via hysterectomy.  Alternative such as Oriahnn, po progestins, and or uterine artery embolization discusssed   2) Return in about 1 week (around 09/26/2019) for TVUS and endometrial biopsy/pap.   Malachy Mood, MD, [facility] OB/GYN,  [employer]

## 2019-10-09 ENCOUNTER — Ambulatory Visit (INDEPENDENT_AMBULATORY_CARE_PROVIDER_SITE_OTHER): Payer: Medicaid Other | Admitting: Obstetrics and Gynecology

## 2019-10-09 ENCOUNTER — Encounter: Payer: Self-pay | Admitting: Obstetrics and Gynecology

## 2019-10-09 ENCOUNTER — Other Ambulatory Visit: Payer: Self-pay

## 2019-10-09 ENCOUNTER — Ambulatory Visit (INDEPENDENT_AMBULATORY_CARE_PROVIDER_SITE_OTHER): Payer: Medicaid Other

## 2019-10-09 ENCOUNTER — Other Ambulatory Visit (HOSPITAL_COMMUNITY)
Admission: RE | Admit: 2019-10-09 | Discharge: 2019-10-09 | Disposition: A | Discharge status: Home / Self Care | Payer: Medicaid Other | Source: Ambulatory Visit | Attending: Obstetrics and Gynecology | Admitting: Obstetrics and Gynecology

## 2019-10-09 ENCOUNTER — Other Ambulatory Visit: Payer: Self-pay | Admitting: Obstetrics and Gynecology

## 2019-10-09 VITALS — BP 118/70 | Ht 65.0 in | Wt 235.0 lb

## 2019-10-09 DIAGNOSIS — N939 Abnormal uterine and vaginal bleeding, unspecified: Secondary | ICD-10-CM | POA: Diagnosis present

## 2019-10-09 DIAGNOSIS — Z124 Encounter for screening for malignant neoplasm of cervix: Secondary | ICD-10-CM | POA: Insufficient documentation

## 2019-10-09 DIAGNOSIS — Z113 Encounter for screening for infections with a predominantly sexual mode of transmission: Secondary | ICD-10-CM | POA: Diagnosis present

## 2019-10-09 NOTE — Progress Notes (Signed)
Gynecology Ultrasound Follow Up  Chief Complaint:  Chief Complaint  Patient presents with  . Follow-up    GYN ultrasound     History of Present Illness: Patient is a 44 y.o. female who presents today for ultrasound evaluation of abnormal uterine bleeding.  Ultrasound demonstrates the following findgins Adnexa: no masses seen  Uterus: Enlarged with a single dominant 8cm intramural fibroid encroaching on the endometrial cavity. Endometrial stripe not clearly imaged Additional: no free fluid  Review of Systems: Review of Systems  Constitutional: Negative.   Gastrointestinal: Positive for abdominal pain. Negative for blood in stool, constipation, diarrhea, heartburn, melena, nausea and vomiting.  Genitourinary: Positive for frequency. Negative for dysuria, flank pain, hematuria and urgency.    Past Medical History:  Past Medical History:  Diagnosis Date  . Asthma   . Back pain   . GERD (gastroesophageal reflux disease)   . Hypertension   . Kidney stone   . Migraines   . Sickle cell trait [facility])     Past Surgical History:  Past Surgical History:  Procedure Laterality Date  . CESAREAN SECTION     2    Gynecologic History:  Patient's last menstrual period was 10/04/2019. Contraception: tubal ligation  Family History:  Family History  Problem Relation Age of Onset  . Hypertension Mother   . Hypertension Father   . Diabetes Mellitus II Maternal Grandmother   . Lupus Other     Social History:  Social History   Socioeconomic History  . Marital status: Married    Spouse name: Not on file  . Number of children: Not on file  . Years of education: Not on file  . Highest education level: Not on file  Occupational History  . Occupation: disabled  Tobacco Use  . Smoking status: Current Every Day Smoker    Packs/day: 0.25    Types: Cigarettes  . Smokeless tobacco: Never Used  Substance and Sexual Activity  . Alcohol use: Yes    Alcohol/week: 1.0 standard  drink    Types: 1 Cans of beer per week  . Drug use: No  . Sexual activity: Yes    Birth control/protection: Surgical  Other Topics Concern  . Not on file  Social History Narrative  . Not on file   Social Determinants of Health   Financial Resource Strain:   . Difficulty of Paying Living Expenses:   Food Insecurity:   . Worried About Charity fundraiser in the Last Year:   . Arboriculturist in the Last Year:   Transportation Needs:   . Film/video editor (Medical):   Marland Kitchen Lack of Transportation (Non-Medical):   Physical Activity:   . Days of Exercise per Week:   . Minutes of Exercise per Session:   Stress:   . Feeling of Stress :   Social Connections:   . Frequency of Communication with Friends and Family:   . Frequency of Social Gatherings with Friends and Family:   . Attends Religious Services:   . Active Member of Clubs or Organizations:   . Attends Archivist Meetings:   Marland Kitchen Marital Status:   Intimate Partner Violence:   . Fear of Current or Ex-Partner:   . Emotionally Abused:   Marland Kitchen Physically Abused:   . Sexually Abused:     Allergies:  Allergies  Allergen Reactions  . Ace Inhibitors Swelling  . Lisinopril     Swelling--caused the pt to stay in the hospital  . Peanuts [  Peanut Oil]     Swelling--caused the pt to stay in the hospital    Medications: Prior to Admission medications   Medication Sig Start Date End Date Taking? Authorizing Provider  albuterol (VENTOLIN HFA) 108 (90 Base) MCG/ACT inhaler Inhale 2 puffs into the lungs every 6 (six) hours as needed for wheezing or shortness of breath (wheezing). 03/19/19   Charlott Rakes, MD  amLODipine (NORVASC) 10 MG tablet Take 1 tablet (10 mg total) by mouth daily. 03/19/19   Charlott Rakes, MD  atorvastatin (LIPITOR) 20 MG tablet Take 1 tablet (20 mg total) by mouth daily. 03/19/19   Charlott Rakes, MD  baclofen (LIORESAL) 10 MG tablet Take 10 mg by mouth 2 (two) times daily. 07/16/19   [provider]  butalbital-acetaminophen-caffeine (FIORICET) 50-325-40 MG tablet Take 1 tablet by mouth every 6 (six) hours as needed for headache. 02/13/19 02/13/20  Laban Emperor, PA-C  celecoxib (CELEBREX) 100 MG capsule Take 100 mg by mouth daily. 07/16/19   [provider]  diclofenac Sodium (VOLTAREN) 1 % GEL APPLY ONE HALF INCH TO AFFECTED AREA AS DIRECTED 07/16/19   [provider]  diphenhydrAMINE (BENADRYL) 50 MG tablet Take 1 tablet (50 mg total) by mouth every 8 (eight) hours as needed for allergies. 06/27/19   Patrecia Pour, MD  EPINEPHrine 0.3 mg/0.3 mL IJ SOAJ injection Inject 0.3 mLs (0.3 mg total) into the muscle as needed for anaphylaxis. 07/23/19   Fritzi Mandes, MD  famotidine (PEPCID) 20 MG tablet Take 1 tablet (20 mg total) by mouth 2 (two) times daily. 06/27/19 07/27/19  Patrecia Pour, MD  gabapentin (NEURONTIN) 300 MG capsule Take 2 capsules (600 mg total) by mouth 2 (two) times daily. 03/19/19   Charlott Rakes, MD  montelukast (SINGULAIR) 10 MG tablet Take 1 tablet (10 mg total) by mouth at bedtime. 03/19/19   Charlott Rakes, MD  ondansetron (ZOFRAN ODT) 4 MG disintegrating tablet Take 1 tablet (4 mg total) by mouth every 8 (eight) hours as needed for nausea or vomiting. 12/18/18   Harvest Dark, MD  oxyCODONE-acetaminophen (PERCOCET) 7.5-325 MG tablet Take 1 tablet by mouth 3 (three) times daily as needed. 07/16/19   [provider]  predniSONE (DELTASONE) 10 MG tablet Take 5 tablets (50 mg total) by mouth daily with breakfast. Take 50 mg daily. Taper by 10 mg daily then stop. 07/24/19   Fritzi Mandes, MD  QUEtiapine (SEROQUEL) 50 MG tablet Take 3 tablets (150 mg total) by mouth at bedtime. 07/23/19   Fritzi Mandes, MD  sertraline (ZOLOFT) 50 MG tablet Take 1 tablet (50 mg total) by mouth daily. 10/15/17   Starkes-Perry, Gayland Curry, FNP  SUMAtriptan (IMITREX) 100 MG tablet 100 mg orally at the onset of a migraine, may repeat in 2 hours if headache persists or  recurs. Max 200mg  in 24 hrs 03/19/19   Charlott Rakes, MD  topiramate (TOPAMAX) 50 MG tablet Take 2 tablets (100 mg total) by mouth 2 (two) times daily. 03/19/19   Charlott Rakes, MD  traZODone (DESYREL) 100 MG tablet Take 1 tablet (100 mg total) by mouth at bedtime. 10/14/17   Starkes-Perry, Gayland Curry, FNP  Vitamin D, Ergocalciferol, (DRISDOL) 1.25 MG (50000 UNIT) CAPS capsule Take 50,000 Units by mouth once a week. (MONDAYS) 07/16/19   [provider]    Physical Exam Vitals: Blood pressure 118/70, height 5\' 5"  (1.651 m), weight 235 lb (106.6 kg), last menstrual period 10/04/2019.  General: NAD HEENT: normocephalic, anicteric Pulmonary: No increased work of  breathing Extremities: no edema, erythema, or tenderness Genitourinary:  External: Normal external female genitalia.  Normal urethral meatus, normal Bartholin's and Skene's glands.    Vagina: Normal vaginal mucosa, no evidence of prolapse.    Cervix: Grossly normal in appearance, mild bleeding, slightly left of midline  Uterus: Enlarged, decreased mobility/decensus, irregular contour.  No CMT  Adnexa: ovaries non-enlarged, no adnexal masses  Rectal: deferred  Lymphatic: no evidence of inguinal lymphadenopathy Neurologic: Grossly intact, normal gait Psychiatric: mood appropriate, affect full   ENDOMETRIAL BIOPSY     The indications for endometrial biopsy were reviewed.   Risks of the biopsy including cramping, bleeding, infection, uterine perforation, inadequate specimen and need for additional procedures  were discussed. The patient states she understands and agrees to undergo procedure today. Consent was signed. Time out was performed. Urine HCG was negative. A Graves speculum was placed and the cervix was brought into view.  The cervix was prepped with Betadine. A single-toothed tenaculum was placed on the anterior lip of the cervix for traction. A 3 mm pipelle was introduced through the cervix into the endometrial cavity  without difficulty to a depth of 10cm, and a small amount of tissue was obtained, the resulting specime sent to pathology. The instruments were removed from the patient's vagina. Minimal bleeding from the cervix was noted. The patient tolerated the procedure well. Routine post-procedure instructions were given to the patient.  She will be contacted by phone one results become available.      Assessment: 44 y.o. G3P3003 AUB-L  Plan: Problem List Items Addressed This Visit    None    Visit Diagnoses    Abnormal uterine bleeding    -  Primary   Relevant Orders   Surgical pathology   Screening for malignant neoplasm of cervix       Relevant Orders   Cytology - PAP   Routine screening for STI (sexually transmitted infection)       Relevant Orders   Cytology - PAP      1) AUB-L - discussed management option of uterine fibroid including expectant, hormonal (including IUD and Oriahnn), uterine artery embolization, and hysterectomy.  After discussion of pros and cons patient desires definitive surgical management - Pap collected - endometrial biopsy obtained - post TLH, BS, cysto  2) A total of 15 minutes were spent in face-to-face contact with the patient during this encounter with over half of that time devoted to counseling and coordination of care.  3) Return will be contacted for surgery scheduling.    Malachy Mood, MD, [facility], [facility] 10/09/2019, 5:46 PM

## 2019-10-10 LAB — SURGICAL PATHOLOGY

## 2019-10-11 ENCOUNTER — Encounter: Payer: Self-pay | Admitting: Obstetrics and Gynecology

## 2019-10-11 DIAGNOSIS — D251 Intramural leiomyoma of uterus: Secondary | ICD-10-CM | POA: Insufficient documentation

## 2019-10-11 DIAGNOSIS — N939 Abnormal uterine and vaginal bleeding, unspecified: Secondary | ICD-10-CM | POA: Insufficient documentation

## 2019-10-11 NOTE — Telephone Encounter (Signed)
Called pt to schedule TLH/BS, cystoscopy      with [facility]  DOS 7/27   H&P 7/20 @ 3:50   Covid testing 7/23 @ 8-10:30, [address], drive up and wear mask. Advised pt to quarantine until DOS.  Pre-admit phone call appointment to be requested - date and time will be included on H&P paper work. Explained that this appointment has a call window. Based on the time scheduled will indicate if the call will be received within a 4 hour window before 1:00 or after.  Advised that pt may also receive calls from the hospital pharmacy and pre-service center.  Confirmed pt has Medicaid as primary insurance. No secondary insurance.

## 2019-10-11 NOTE — Telephone Encounter (Signed)
-----   Message from Malachy Mood, MD sent at 10/11/2019  9:59 AM EDT ----- Not sure if I've sent a surgery request for this patient yet.  If I have ignore.  If I haven't let me know and I'll send it

## 2019-10-11 NOTE — Telephone Encounter (Signed)
Ok just sent it

## 2019-10-11 NOTE — Telephone Encounter (Signed)
-----   Message from Malachy Mood, MD sent at 10/11/2019 11:05 AM EDT ----- Surgery Date: 3-6 weeks  LOS: observation  Surgery Booking Request Patient Full Name: Kelly Byrd MRN: [id number]  DOB: 08-May-1975  Surgeon: Malachy Mood, MD  Requested Surgery Date and Time: 3-6 weeks Primary Diagnosis and Code: AUB N93.9 Secondary Diagnosis and Code: Intramural fibroid D25.1 Surgical Procedure: TLH/BS and cystoscopy L&D Notification:N/A Admission Status: observation Length of Surgery: 2hrs Special Case Needs: none H&P: week of (date) Phone Interview or Office Pre-Admit: pre-admit Interpreter: No Language: English Medical Clearance: No Special Scheduling Instructions: none

## 2019-10-11 NOTE — Telephone Encounter (Signed)
I have not rec'd a request yet. I will be looking for it.

## 2019-10-11 NOTE — Progress Notes (Signed)
Not sure if I've sent a surgery request for this patient yet.  If I have ignore.  If I haven't let me know and I'll send it

## 2019-10-12 LAB — CYTOLOGY - PAP
Chlamydia: NEGATIVE
Comment: NEGATIVE
Comment: NEGATIVE
Comment: NORMAL
Diagnosis: NEGATIVE
High risk HPV: NEGATIVE
Neisseria Gonorrhea: NEGATIVE

## 2019-10-16 ENCOUNTER — Encounter: Payer: Medicaid Other | Admitting: Obstetrics and Gynecology

## 2019-10-16 ENCOUNTER — Other Ambulatory Visit: Payer: Self-pay

## 2019-10-17 ENCOUNTER — Other Ambulatory Visit: Payer: Self-pay

## 2019-10-17 ENCOUNTER — Encounter
Admission: RE | Admit: 2019-10-17 | Discharge: 2019-10-17 | Disposition: A | Discharge status: Home / Self Care | Payer: Medicaid Other | Source: Ambulatory Visit | Attending: Obstetrics and Gynecology | Admitting: Obstetrics and Gynecology

## 2019-10-17 ENCOUNTER — Other Ambulatory Visit: Payer: Self-pay | Admitting: Obstetrics and Gynecology

## 2019-10-17 ENCOUNTER — Ambulatory Visit (INDEPENDENT_AMBULATORY_CARE_PROVIDER_SITE_OTHER): Payer: Medicaid Other | Admitting: Obstetrics and Gynecology

## 2019-10-17 ENCOUNTER — Encounter: Payer: Self-pay | Admitting: Obstetrics and Gynecology

## 2019-10-17 VITALS — BP 158/98 | Ht 64.0 in | Wt 241.0 lb

## 2019-10-17 DIAGNOSIS — N939 Abnormal uterine and vaginal bleeding, unspecified: Secondary | ICD-10-CM | POA: Diagnosis not present

## 2019-10-17 DIAGNOSIS — Z01818 Encounter for other preprocedural examination: Secondary | ICD-10-CM

## 2019-10-17 DIAGNOSIS — D251 Intramural leiomyoma of uterus: Secondary | ICD-10-CM | POA: Diagnosis not present

## 2019-10-17 DIAGNOSIS — Z01812 Encounter for preprocedural laboratory examination: Secondary | ICD-10-CM | POA: Insufficient documentation

## 2019-10-17 HISTORY — DX: Prediabetes: R73.03

## 2019-10-17 HISTORY — DX: Chronic kidney disease, unspecified: N18.9

## 2019-10-17 HISTORY — DX: Unspecified dislocation of unspecified hip, initial encounter: S73.006A

## 2019-10-17 HISTORY — DX: Depression, unspecified: F32.A

## 2019-10-17 MED ORDER — METRONIDAZOLE 500 MG PO TABS: 2000.0000 mg | ORAL_TABLET | Freq: Once | ORAL | 0 refills | Status: DC

## 2019-10-17 MED ORDER — METRONIDAZOLE 500 MG PO TABS: 2000.0000 mg | ORAL_TABLET | Freq: Once | ORAL | 0 refills | Status: AC

## 2019-10-17 NOTE — Progress Notes (Signed)
Flagyl rx positive trichomonas on pap

## 2019-10-17 NOTE — Progress Notes (Signed)
Obstetrics & Gynecology Surgery H&P    Chief Complaint: Scheduled Surgery   History of Present Illness: Patient is a 44 y.o. [id number] presenting for scheduled TLH, BS, cystoscopy, for the treatment or further evaluation of AUB-L.   Prior Treatments prior to proceeding with surgery include: ultrasound, OCP  Preoperative Pap: 10/09/2019 Results: NILM HPV negative  Preoperative Endometrial biopsy: 10/09/2019 Findings: pathology benign proliferative Preoperative Ultrasound: 10/09/2019 Findings: 84.8 x 60.7 x 71.6 mm intramural anterior fibroid multiple smaller fibroids   Review of Systems:10 point review of systems  Past Medical History:  Patient Active Problem List   Diagnosis Date Noted  . Abnormal uterine bleeding (AUB) 10/11/2019  . Intramural uterine fibroid 10/11/2019  . Angioedema 06/26/2019  . Facial swelling   . Allergic reaction 06/25/2019  . Hypokalemia 06/25/2019  . Abdominal pain 06/25/2019  . Major depressive disorder, recurrent severe without psychotic features ([place]) 10/11/2017  . Cocaine abuse ([place]) 10/11/2017  . Lumbar radiculopathy 05/31/2017  . Hyperlipidemia 05/31/2017  . GERD (gastroesophageal reflux disease) 08/05/2016  . Fibroids 09/19/2015  . Hypertension 08/29/2015  . Asthma 08/29/2015  . Back pain 08/29/2015  . 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 Anemia, iron deficiency 10/29/2014  . Menometrorrhagia 10/29/2014  . Migraine 10/27/2014    Past Surgical History:  Past Surgical History:  Procedure Laterality Date  . CESAREAN SECTION     2    Family History:  Family History  Problem Relation Age of Onset  . Hypertension Mother   . Hypertension Father   . Diabetes Mellitus II Maternal Grandmother   . Lupus Other     Social History:  Social History   Socioeconomic History  . Marital status: Married    Spouse name: Not on file  . Number of children: Not on file  . Years of education: Not on file  . Highest education level:  Not on file  Occupational History  . Occupation: disabled  Tobacco Use  . Smoking status: Current Every Day Smoker    Packs/day: 0.25    Types: Cigarettes  . Smokeless tobacco: Never Used  Substance and Sexual Activity  . Alcohol use: Yes    Alcohol/week: 1.0 standard drink    Types: 1 Cans of beer per week  . Drug use: No  . Sexual activity: Yes    Birth control/protection: Surgical  Other Topics Concern  . Not on file  Social History Narrative  . Not on file   Social Determinants of Health   Financial Resource Strain:   . Difficulty of Paying Living Expenses:   Food Insecurity:   . Worried About Charity fundraiser in the Last Year:   . Arboriculturist in the Last Year:   Transportation Needs:   . Film/video editor (Medical):   Marland Kitchen Lack of Transportation (Non-Medical):   Physical Activity:   . Days of Exercise per Week:   . Minutes of Exercise per Session:   Stress:   . Feeling of Stress :   Social Connections:   . Frequency of Communication with Friends and Family:   . Frequency of Social Gatherings with Friends and Family:   . Attends Religious Services:   . Active Member of Clubs or Organizations:   . Attends Archivist Meetings:   Marland Kitchen Marital Status:   Intimate Partner Violence:   . Fear of Current or Ex-Partner:   . Emotionally Abused:   Marland Kitchen Physically Abused:   . Sexually Abused:  Allergies:  Allergies  Allergen Reactions  . Ace Inhibitors Swelling  . Lisinopril     Swelling--caused the pt to stay in the hospital  . Peanuts [Peanut Oil]     Swelling--caused the pt to stay in the hospital    Medications: Prior to Admission medications   Medication Sig Start Date End Date Taking? Authorizing Provider  albuterol (VENTOLIN HFA) 108 (90 Base) MCG/ACT inhaler Inhale 2 puffs into the lungs every 6 (six) hours as needed for wheezing or shortness of breath (wheezing). 03/19/19  Yes Charlott Rakes, MD  amLODipine (NORVASC) 10 MG tablet Take 1  tablet (10 mg total) by mouth daily. 03/19/19  Yes Charlott Rakes, MD  atorvastatin (LIPITOR) 20 MG tablet Take 1 tablet (20 mg total) by mouth daily. 03/19/19  Yes Charlott Rakes, MD  baclofen (LIORESAL) 10 MG tablet Take 10 mg by mouth in the morning and at bedtime.  07/16/19  Yes [provider]  celecoxib (CELEBREX) 100 MG capsule Take 100 mg by mouth daily. 07/16/19  Yes [provider]  diclofenac Sodium (VOLTAREN) 1 % GEL Apply 1 application topically 4 (four) times daily as needed (pain.).  07/16/19  Yes [provider]  diphenhydrAMINE (BENADRYL) 50 MG tablet Take 1 tablet (50 mg total) by mouth every 8 (eight) hours as needed for allergies. Patient taking differently: Take 50 mg by mouth every evening.  06/27/19  Yes Patrecia Pour, MD  EPINEPHrine 0.3 mg/0.3 mL IJ SOAJ injection Inject 0.3 mLs (0.3 mg total) into the muscle as needed for anaphylaxis. 07/23/19  Yes Fritzi Mandes, MD  famotidine (PEPCID) 20 MG tablet Take 1 tablet (20 mg total) by mouth 2 (two) times daily. 06/27/19 10/11/20 Yes Patrecia Pour, MD  gabapentin (NEURONTIN) 300 MG capsule Take 2 capsules (600 mg total) by mouth 2 (two) times daily. 03/19/19  Yes Charlott Rakes, MD  metroNIDAZOLE (FLAGYL) 500 MG tablet Take 4 tablets (2,000 mg total) by mouth once for 1 dose. 10/17/19 10/17/19 Yes Malachy Mood, MD  montelukast (SINGULAIR) 10 MG tablet Take 1 tablet (10 mg total) by mouth at bedtime. 03/19/19  Yes Charlott Rakes, MD  ondansetron (ZOFRAN ODT) 4 MG disintegrating tablet Take 1 tablet (4 mg total) by mouth every 8 (eight) hours as needed for nausea or vomiting. 12/18/18  Yes Harvest Dark, MD  oxyCODONE-acetaminophen (PERCOCET) 7.5-325 MG tablet Take 1 tablet by mouth every 8 (eight) hours as needed (pain.).  07/16/19  Yes [provider]  predniSONE (DELTASONE) 10 MG tablet Take 5 tablets (50 mg total) by mouth daily with breakfast. Take 50 mg daily. Taper by 10 mg daily then stop.  07/24/19  Yes Fritzi Mandes, MD  QUEtiapine (SEROQUEL) 200 MG tablet Take 200 mg by mouth at bedtime. 10/08/19  Yes [provider]  sertraline (ZOLOFT) 100 MG tablet Take 100 mg by mouth daily. 10/08/19  Yes [provider]  SUMAtriptan (IMITREX) 100 MG tablet 100 mg orally at the onset of a migraine, may repeat in 2 hours if headache persists or recurs. Max 200mg  in 24 hrs Patient taking differently: Take 100 mg by mouth every 2 (two) hours as needed for migraine. 100 mg orally at the onset of a migraine, may repeat in 2 hours if headache persists or recurs. Max 200mg  in 24 hrs 03/19/19  Yes Newlin, Charlane Ferretti, MD  topiramate (TOPAMAX) 50 MG tablet Take 2 tablets (100 mg total) by mouth 2 (two) times daily. Patient taking differently: Take 50 mg by mouth 2 (  two) times daily.  03/19/19  Yes Charlott Rakes, MD  traZODone (DESYREL) 100 MG tablet Take 1 tablet (100 mg total) by mouth at bedtime. 10/14/17  Yes Starkes-Perry, Gayland Curry, FNP  Vitamin D, Ergocalciferol, (DRISDOL) 1.25 MG (50000 UNIT) CAPS capsule Take 50,000 Units by mouth every Monday.  07/16/19  Yes [provider]    Physical Exam Vitals: Blood pressure (!) 158/98, height 5\' 4"  (1.626 m), weight 241 lb (109.3 kg), last menstrual period 10/04/2019. General: NAD HEENT: normocephalic, anicteric Pulmonary: No increased work of breathing, CTAB Cardiovascular: RRR, distal pulses 2+ Abdomen: soft, non-tender, non-distended Genitourinary: deferred Extremities: no edema, erythema, or tenderness Neurologic: Grossly intact Psychiatric: mood appropriate, affect full  Imaging US PELVIC COMPLETE WITH TRANSVAGINAL  Result Date: 10/09/2019 Patient Name: Kelly Byrd DOB: 1975-09-24 MRN: [id number] ULTRASOUND REPORT Location: [facility] Date of Service: 10/09/2019 Indications:Abnormal Uterine Bleeding  And pelvic pain Findings: The uterus is anteverted and measures 14.0 x 9.7 x 10.2 cm. Echo texture is  heterogenous with evidence of focal masses. Within the uterus are multiple suspected fibroids measuring: Fibroid 1: 84.8 x 60.7 x 71.6 mm intramural anterior The Endometrium is not visualized. Right Ovary is not visualized. Left Ovary measures 2.8 x 1.6 x 1.9 cm. It is normal in appearance. Survey of the adnexa demonstrates no adnexal masses. There is no free fluid in the cul de sac. Impression: 1. There is one intramural fibroid seen. 2. The endometrium is not visible. 3. The left ovary appears normal, the right ovary is not visible. Recommendations: 1.Clinical correlation with the patient's History and Physical Exam. Gweneth Dimitri, RT Images reviewed.  One dominant intramural fibroid encroaching on the endometrial cavity.  Shadowing of the fibroid precludes clear evaluation of the endometrium. Malachy Mood, MD, [facility], [facility] 10/09/2019, 11:22 AM    Assessment: 44 y.o. [id number] presenting for scheduled TLH, BS, cystoscopy  Plan: 1) Patient opts for definitive surgical management via hysterectomy. The risks of surgery were discussed in detail with the patient including but not limited to: bleeding which may require transfusion or reoperation; infection which may require antibiotics; injury to bowel, bladder, ureters or other surrounding organs (With a literature reported rate of urinary tract injury of 1% quoted); need for additional procedures including laparotomy; thromboembolic phenomenon, incisional problems and other postoperative/anesthesia complications.  Patient was also advised that recovery procedure generally involves an overnight stay; and the  expected recovery time after a hysterectomy being in the range of 6-8 weeks.  Likelihood of success in alleviating the patient's symptoms was discussed.  While definitive in regards to issues with menstural bleeding, pelvic pain if present preoperatively may continue and in fact worsen postoperatively.  She is aware  that the procedure will render her unable to pursue childbearing in the future.   She was told that she will be contacted by our surgical scheduler regarding the time and date of her surgery; routine preoperative instructions of having nothing to eat or drink after midnight on the day prior to surgery and also coming to the hospital 1.5 hours prior to her time of surgery were also emphasized.  She was told she may be called for a preoperative appointment about a week prior to surgery and will be given further preoperative instructions at that visit.  Routine postoperative instructions will be reviewed with the patient and her family in detail after surgery. Printed patient education handouts about the procedure was given to the patient to review at home.  2) Routine postoperative instructions were reviewed with the patient and her family in detail today including the expected length of recovery and likely postoperative course.  The patient concurred with the proposed plan, giving informed written consent for the surgery today.  Patient instructed on the importance of being NPO after midnight prior to her procedure.  If warranted preoperative prophylactic antibiotics and SCDs ordered on call to the OR to meet SCIP guidelines and adhere to recommendation laid forth in [place] Number 104 May 2009  "Antibiotic Prophylaxis for Gynecologic Procedures".     Malachy Mood, MD, [facility], [facility] 10/17/2019, 9:03 AM

## 2019-10-17 NOTE — Patient Instructions (Addendum)
Your procedure is scheduled on: Tues 7/27 Report to Day Surgery.[facility] To find out your arrival time please call [phone] between 1PM - 3PM on Mon. 7/26.  Remember: Instructions that are not followed completely may result in serious medical risk,  up to and including death, or upon the discretion of your surgeon and anesthesiologist your  surgery may need to be rescheduled.     _X__ 1. Do not eat food after midnight the night before your procedure.                 No gum chewing or hard candies. You may drink clear liquids up to 2 hours                 before you are scheduled to arrive for your surgery- DO not drink clear                 liquids within 2 hours of the start of your surgery.                 Clear Liquids include:  water, apple juice without pulp, clear Gatorade, G2 or                  Gatorade Zero (avoid Red/Purple/Blue), Black Coffee or Tea (Do not add                 anything to coffee or tea). _____2.   Complete the carbohydrate drink provided to you, 2 hours before arrival.  __X__2.  On the morning of surgery brush your teeth with toothpaste and water, you                may rinse your mouth with mouthwash if you wish.  Do not swallow any toothpaste of mouthwash.     _X__ 3.  No Alcohol for 24 hours before or after surgery.   _X__ 4.  Do Not Smoke or use e-cigarettes For 24 Hours Prior to Your Surgery.                 Do not use any chewable tobacco products for at least 6 hours prior to                 Surgery.  __x_  5.  Do not use any recreational drugs (marijuana, cocaine, heroin, ecstacy, MDMA or other)                For at least one week prior to your surgery.  Combination of these drugs with anesthesia                May have life threatening results.  ____  6.  Bring all medications with you on the day of surgery if instructed.   __x__  7.  Notify your doctor if there is any change in your medical condition      (cold,  fever, infections).     Do not wear jewelry, make-up, hairpins, clips or nail polish. Do not wear lotions, powders, or perfumes. You may wear deodorant. Do not shave 48 hours prior to surgery.  Do not bring valuables to the hospital.    [facility] is not responsible for any belongings or valuables.  Contacts, dentures or bridgework may not be worn into surgery. Leave your suitcase in the car. After surgery it may be brought to your room. For patients admitted to the hospital, discharge time is determined by your treatment team.   Patients  discharged the day of surgery will not be allowed to drive home.   Make arrangements for someone to be with you for the first 24 hours of your Same Day Discharge.    Please read over the following fact sheets that you were given:    __x__ Take these medicines the morning of surgery with A SIP OF WATER:    1.amLODipine (NORVASC) 10 MG tablet  2. celecoxib (CELEBREX) 100 MG capsule  3. famotidine (PEPCID) 20 MG tablet  The night before and the morning of surgery  4.gabapentin (NEURONTIN) 300 MG capsule  5.sertraline (ZOLOFT) 100 MG tablet  6.topiramate (TOPAMAX) 50 MG tablet  ____ Fleet Enema (as directed)   __x__ Use CHG Soap (or wipes) as directed  ____ Use Benzoyl Peroxide Gel as instructed  __x__ Use inhalers on the day of surgery albuterol (VENTOLIN HFA) 108 (90 Base) MCG/ACT inhaler and bring to hospital  ____ Stop metformin 2 days prior to surgery    ____ Take 1/2 of usual insulin dose the night before surgery. No insulin the morning          of surgery.   ____ Stop Coumadin/Plavix/aspirin on   __x__ Stop Anti-inflammatories no ibuprofen aleve or aspirin products   May take tylenol   ____ Stop supplements until after surgery.    ____ Bring C-Pap to the hospital.

## 2019-10-18 ENCOUNTER — Telehealth: Payer: Self-pay

## 2019-10-18 NOTE — Telephone Encounter (Signed)
Pt calling for refill of flagyl.  [phone]  Left msg for pt to call with sxs.

## 2019-10-18 NOTE — Telephone Encounter (Signed)
This encounter was created in error - please disregard.

## 2019-10-19 ENCOUNTER — Other Ambulatory Visit
Admission: RE | Admit: 2019-10-19 | Discharge: 2019-10-19 | Disposition: A | Discharge status: Home / Self Care | Payer: Medicaid Other | Source: Ambulatory Visit | Attending: Obstetrics and Gynecology | Admitting: Obstetrics and Gynecology

## 2019-10-19 ENCOUNTER — Other Ambulatory Visit: Payer: Self-pay

## 2019-10-19 DIAGNOSIS — Z01812 Encounter for preprocedural laboratory examination: Secondary | ICD-10-CM | POA: Diagnosis present

## 2019-10-19 DIAGNOSIS — Z20822 Contact with and (suspected) exposure to covid-19: Secondary | ICD-10-CM | POA: Insufficient documentation

## 2019-10-19 LAB — BASIC METABOLIC PANEL
Anion gap: 11 (ref 5–15)
BUN: 8 mg/dL (ref 6–20)
CO2: 24 mmol/L (ref 22–32)
Calcium: 9 mg/dL (ref 8.9–10.3)
Chloride: 105 mmol/L (ref 98–111)
Creatinine, Ser: 0.69 mg/dL (ref 0.44–1.00)
GFR calc Af Amer: >60 mL/min (ref >60)
GFR calc non Af Amer: >60 mL/min (ref >60)
Glucose, Bld: 127 mg/dL — ABNORMAL HIGH (ref 70–99)
Potassium: 3.5 mmol/L (ref 3.5–5.1)
Sodium: 140 mmol/L (ref 135–145)

## 2019-10-19 LAB — CBC
HCT: 31.9 % — ABNORMAL LOW (ref 36.0–46.0)
Hemoglobin: 9.2 g/dL — ABNORMAL LOW (ref 12.0–15.0)
MCH: 19.2 pg — ABNORMAL LOW (ref 26.0–34.0)
MCHC: 28.8 g/dL — ABNORMAL LOW (ref 30.0–36.0)
MCV: 66.5 fL — ABNORMAL LOW (ref 80.0–100.0)
Platelets: 603 10*3/uL — ABNORMAL HIGH (ref 150–400)
RBC: 4.8 MIL/uL (ref 3.87–5.11)
RDW: 21 % — ABNORMAL HIGH (ref 11.5–15.5)
WBC: 12.6 10*3/uL — ABNORMAL HIGH (ref 4.0–10.5)
nRBC: 0 % (ref 0.0–0.2)

## 2019-10-19 LAB — SARS CORONAVIRUS 2 (TAT 6-24 HRS): SARS Coronavirus 2: NEGATIVE

## 2019-10-19 NOTE — Progress Notes (Signed)
  [facility] Perioperative Services: Pre-Admission/Anesthesia Testing  Abnormal Lab Notification    Date: 10/19/19  Name: Kelly Byrd MRN:   [id number]  Re: Abnormal labs noted during PAT appointment   Provider(s) Notified: Malachy Mood, MD Notification mode: Routed and/or faxed via [place] LAB VALUE(S): Lab Results  Component Value Date   WBC 12.6 (H) 10/19/2019   HGB 9.2 (L) 10/19/2019   HCT 31.9 (L) 10/19/2019   MCV 66.5 (L) 10/19/2019   MCH 19.2 (L) 10/19/2019   MCHC 28.8 (L) 10/19/2019   PLT 603 (H) 10/19/2019    Note: Patient scheduled for hysterectomy on 07/27/20218. Type and screen has been collected.   Honor Loh, MSN, APRN, FNP-C, CEN [facility]  Peri-operative Services Nurse Practitioner Phone: [phone] 10/19/19 12:46 PM

## 2019-10-23 ENCOUNTER — Ambulatory Visit: Payer: Medicaid Other | Admitting: Anesthesiology

## 2019-10-23 ENCOUNTER — Encounter: Payer: Self-pay | Admitting: Obstetrics and Gynecology

## 2019-10-23 ENCOUNTER — Inpatient Hospital Stay
Admission: RE | Admit: 2019-10-23 | Discharge: 2019-10-26 | DRG: 742 | Disposition: A | Discharge status: Home / Self Care | Payer: Medicaid Other | Attending: Obstetrics and Gynecology | Admitting: Obstetrics and Gynecology

## 2019-10-23 ENCOUNTER — Encounter
Admission: RE | Disposition: A | Discharge status: Home / Self Care | Payer: Self-pay | Source: Home / Self Care | Attending: Obstetrics and Gynecology

## 2019-10-23 ENCOUNTER — Other Ambulatory Visit: Payer: Self-pay

## 2019-10-23 DIAGNOSIS — R296 Repeated falls: Secondary | ICD-10-CM | POA: Diagnosis present

## 2019-10-23 DIAGNOSIS — N939 Abnormal uterine and vaginal bleeding, unspecified: Principal | ICD-10-CM | POA: Diagnosis present

## 2019-10-23 DIAGNOSIS — D251 Intramural leiomyoma of uterus: Secondary | ICD-10-CM | POA: Diagnosis present

## 2019-10-23 DIAGNOSIS — D62 Acute posthemorrhagic anemia: Secondary | ICD-10-CM | POA: Diagnosis not present

## 2019-10-23 DIAGNOSIS — I10 Essential (primary) hypertension: Secondary | ICD-10-CM | POA: Diagnosis present

## 2019-10-23 DIAGNOSIS — Z5331 Laparoscopic surgical procedure converted to open procedure: Secondary | ICD-10-CM

## 2019-10-23 DIAGNOSIS — K660 Peritoneal adhesions (postprocedural) (postinfection): Secondary | ICD-10-CM

## 2019-10-23 DIAGNOSIS — R102 Pelvic and perineal pain: Secondary | ICD-10-CM | POA: Diagnosis present

## 2019-10-23 DIAGNOSIS — Z9181 History of falling: Secondary | ICD-10-CM | POA: Diagnosis not present

## 2019-10-23 DIAGNOSIS — N736 Female pelvic peritoneal adhesions (postinfective): Secondary | ICD-10-CM | POA: Diagnosis present

## 2019-10-23 DIAGNOSIS — K219 Gastro-esophageal reflux disease without esophagitis: Secondary | ICD-10-CM | POA: Diagnosis present

## 2019-10-23 DIAGNOSIS — Z90711 Acquired absence of uterus with remaining cervical stump: Secondary | ICD-10-CM

## 2019-10-23 DIAGNOSIS — F17210 Nicotine dependence, cigarettes, uncomplicated: Secondary | ICD-10-CM | POA: Diagnosis present

## 2019-10-23 DIAGNOSIS — N800 Endometriosis of uterus: Secondary | ICD-10-CM | POA: Diagnosis present

## 2019-10-23 HISTORY — PX: CYSTOSCOPY: SHX5120

## 2019-10-23 HISTORY — PX: TOTAL LAPAROSCOPIC HYSTERECTOMY WITH SALPINGECTOMY: SHX6742

## 2019-10-23 LAB — URINE DRUG SCREEN, QUALITATIVE (ARMC ONLY)
Amphetamines, Ur Screen: NOT DETECTED
Barbiturates, Ur Screen: NOT DETECTED
Benzodiazepine, Ur Scrn: NOT DETECTED
Cannabinoid 50 Ng, Ur ~~LOC~~: NOT DETECTED
Cocaine Metabolite,Ur ~~LOC~~: NOT DETECTED
MDMA (Ecstasy)Ur Screen: NOT DETECTED
Methadone Scn, Ur: NOT DETECTED
Opiate, Ur Screen: NOT DETECTED
Phencyclidine (PCP) Ur S: NOT DETECTED
Tricyclic, Ur Screen: NOT DETECTED

## 2019-10-23 LAB — BPAM RBC
Blood Product Expiration Date: 202108292359
Blood Product Expiration Date: 202108292359
ISSUE DATE / TIME: 202107271008
ISSUE DATE / TIME: 202107271008
Unit Type and Rh: 5100
Unit Type and Rh: 5100

## 2019-10-23 LAB — TYPE AND SCREEN
ABO/RH(D): B POS
Antibody Screen: NEGATIVE
Unit division: 0
Unit division: 0

## 2019-10-23 LAB — ABO/RH: ABO/RH(D): B POS

## 2019-10-23 LAB — PREPARE RBC (CROSSMATCH)

## 2019-10-23 LAB — POCT PREGNANCY, URINE: Preg Test, Ur: NEGATIVE

## 2019-10-23 SURGERY — HYSTERECTOMY, TOTAL, LAPAROSCOPIC, WITH SALPINGECTOMY
Anesthesia: General

## 2019-10-23 MED ORDER — ONDANSETRON HCL 4 MG/2ML IJ SOLN: 4.0000 mg | Freq: Four times a day (QID) | INTRAMUSCULAR | Status: DC | PRN

## 2019-10-23 MED ORDER — SODIUM CHLORIDE 0.9% FLUSH: 9.0000 mL | INTRAVENOUS | Status: DC | PRN

## 2019-10-23 MED ORDER — CHLORHEXIDINE GLUCONATE 0.12 % MT SOLN
OROMUCOSAL | Status: AC
  Filled 2019-10-23: qty 15

## 2019-10-23 MED ORDER — SEVOFLURANE IN SOLN
RESPIRATORY_TRACT | Status: AC
  Filled 2019-10-23: qty 250

## 2019-10-23 MED ORDER — DEXMEDETOMIDINE (PRECEDEX) IN NS 20 MCG/5ML (4 MCG/ML) IV SYRINGE
PREFILLED_SYRINGE | INTRAVENOUS | Status: DC | PRN
  Administered 2019-10-23 (×2): 20 ug via INTRAVENOUS

## 2019-10-23 MED ORDER — DEXMEDETOMIDINE HCL IN NACL 80 MCG/20ML IV SOLN
INTRAVENOUS | Status: AC
  Filled 2019-10-23: qty 20

## 2019-10-23 MED ORDER — ONDANSETRON HCL 4 MG/2ML IJ SOLN: 4.0000 mg | Freq: Once | INTRAMUSCULAR | Status: DC | PRN

## 2019-10-23 MED ORDER — LACTATED RINGERS IV SOLN: INTRAVENOUS | Status: DC

## 2019-10-23 MED ORDER — MIDAZOLAM HCL 2 MG/2ML IJ SOLN
INTRAMUSCULAR | Status: DC | PRN
  Administered 2019-10-23: 2 mg via INTRAVENOUS

## 2019-10-23 MED ORDER — ROCURONIUM BROMIDE 100 MG/10ML IV SOLN
INTRAVENOUS | Status: DC | PRN
  Administered 2019-10-23: 20 mg via INTRAVENOUS
  Administered 2019-10-23: 25 mg via INTRAVENOUS
  Administered 2019-10-23: 10 mg via INTRAVENOUS

## 2019-10-23 MED ORDER — FENTANYL CITRATE (PF) 100 MCG/2ML IJ SOLN
INTRAMUSCULAR | Status: AC
  Administered 2019-10-23: 25 ug via INTRAVENOUS
  Filled 2019-10-23: qty 2

## 2019-10-23 MED ORDER — CEFAZOLIN SODIUM 1 G IJ SOLR
INTRAMUSCULAR | Status: AC
  Filled 2019-10-23: qty 20

## 2019-10-23 MED ORDER — PHENYLEPHRINE HCL (PRESSORS) 10 MG/ML IV SOLN
INTRAVENOUS | Status: AC
  Filled 2019-10-23: qty 1

## 2019-10-23 MED ORDER — POVIDONE-IODINE 10 % EX SWAB: 2.0000 "application " | Freq: Once | CUTANEOUS | Status: DC

## 2019-10-23 MED ORDER — ONDANSETRON HCL 4 MG PO TABS: 4.0000 mg | ORAL_TABLET | Freq: Four times a day (QID) | ORAL | Status: DC | PRN

## 2019-10-23 MED ORDER — KETOROLAC TROMETHAMINE 30 MG/ML IJ SOLN
30.0000 mg | Freq: Four times a day (QID) | INTRAMUSCULAR | Status: AC | PRN
  Administered 2019-10-23 (×2): 30 mg via INTRAVENOUS
  Filled 2019-10-23 (×2): qty 1

## 2019-10-23 MED ORDER — DEXAMETHASONE SODIUM PHOSPHATE 10 MG/ML IJ SOLN
INTRAMUSCULAR | Status: AC
  Filled 2019-10-23: qty 1

## 2019-10-23 MED ORDER — ALBUTEROL SULFATE HFA 108 (90 BASE) MCG/ACT IN AERS: 2.0000 | INHALATION_SPRAY | Freq: Four times a day (QID) | RESPIRATORY_TRACT | Status: DC | PRN

## 2019-10-23 MED ORDER — AMLODIPINE BESYLATE 10 MG PO TABS
10.0000 mg | ORAL_TABLET | Freq: Every day | ORAL | Status: DC
  Administered 2019-10-24 – 2019-10-26 (×3): 10 mg via ORAL
  Filled 2019-10-23 (×4): qty 1

## 2019-10-23 MED ORDER — ROCURONIUM BROMIDE 10 MG/ML (PF) SYRINGE
PREFILLED_SYRINGE | INTRAVENOUS | Status: AC
  Filled 2019-10-23: qty 10

## 2019-10-23 MED ORDER — SUCCINYLCHOLINE CHLORIDE 20 MG/ML IJ SOLN
INTRAMUSCULAR | Status: DC | PRN
  Administered 2019-10-23: 120 mg via INTRAVENOUS

## 2019-10-23 MED ORDER — MONTELUKAST SODIUM 10 MG PO TABS
10.0000 mg | ORAL_TABLET | Freq: Every day | ORAL | Status: DC
  Administered 2019-10-23 – 2019-10-25 (×3): 10 mg via ORAL
  Filled 2019-10-23 (×4): qty 1

## 2019-10-23 MED ORDER — LACTATED RINGERS IV SOLN: INTRAVENOUS | Status: DC | PRN

## 2019-10-23 MED ORDER — MIDAZOLAM HCL 2 MG/2ML IJ SOLN
INTRAMUSCULAR | Status: AC
  Filled 2019-10-23: qty 2

## 2019-10-23 MED ORDER — FENTANYL CITRATE (PF) 100 MCG/2ML IJ SOLN
INTRAMUSCULAR | Status: AC
  Filled 2019-10-23: qty 2

## 2019-10-23 MED ORDER — ALBUTEROL SULFATE (2.5 MG/3ML) 0.083% IN NEBU
2.5000 mg | INHALATION_SOLUTION | Freq: Four times a day (QID) | RESPIRATORY_TRACT | Status: DC | PRN
  Filled 2019-10-23: qty 3

## 2019-10-23 MED ORDER — CEFAZOLIN SODIUM-DEXTROSE 2-4 GM/100ML-% IV SOLN
INTRAVENOUS | Status: AC
  Filled 2019-10-23: qty 100

## 2019-10-23 MED ORDER — FENTANYL CITRATE (PF) 100 MCG/2ML IJ SOLN
INTRAMUSCULAR | Status: DC | PRN
  Administered 2019-10-23 (×3): 50 ug via INTRAVENOUS

## 2019-10-23 MED ORDER — BUPIVACAINE HCL (PF) 0.5 % IJ SOLN
INTRAMUSCULAR | Status: AC
  Filled 2019-10-23: qty 30

## 2019-10-23 MED ORDER — ALBUTEROL SULFATE HFA 108 (90 BASE) MCG/ACT IN AERS
INHALATION_SPRAY | RESPIRATORY_TRACT | Status: DC | PRN
Start: 2019-10-23 — End: 2019-10-23
  Administered 2019-10-23: 3 via RESPIRATORY_TRACT

## 2019-10-23 MED ORDER — NALOXONE HCL 0.4 MG/ML IJ SOLN: 0.4000 mg | INTRAMUSCULAR | Status: DC | PRN

## 2019-10-23 MED ORDER — MENTHOL 3 MG MT LOZG: 1.0000 | LOZENGE | OROMUCOSAL | Status: DC | PRN

## 2019-10-23 MED ORDER — DIPHENHYDRAMINE HCL 12.5 MG/5ML PO ELIX
12.5000 mg | ORAL_SOLUTION | Freq: Four times a day (QID) | ORAL | Status: DC | PRN
  Filled 2019-10-23: qty 5

## 2019-10-23 MED ORDER — GABAPENTIN 300 MG PO CAPS
600.0000 mg | ORAL_CAPSULE | Freq: Two times a day (BID) | ORAL | Status: DC
  Administered 2019-10-23 – 2019-10-26 (×7): 600 mg via ORAL
  Filled 2019-10-23 (×7): qty 2

## 2019-10-23 MED ORDER — ALBUMIN HUMAN 5 % IV SOLN
INTRAVENOUS | Status: AC
  Filled 2019-10-23: qty 250

## 2019-10-23 MED ORDER — EPHEDRINE 5 MG/ML INJ
INTRAVENOUS | Status: AC
  Filled 2019-10-23: qty 10

## 2019-10-23 MED ORDER — KETOROLAC TROMETHAMINE 30 MG/ML IJ SOLN
INTRAMUSCULAR | Status: AC
  Filled 2019-10-23: qty 1

## 2019-10-23 MED ORDER — SIMETHICONE 80 MG PO CHEW
80.0000 mg | CHEWABLE_TABLET | Freq: Four times a day (QID) | ORAL | Status: DC | PRN
  Administered 2019-10-23 – 2019-10-25 (×5): 80 mg via ORAL
  Filled 2019-10-23 (×5): qty 1

## 2019-10-23 MED ORDER — ACETAMINOPHEN 10 MG/ML IV SOLN
INTRAVENOUS | Status: DC | PRN
  Administered 2019-10-23: 1000 mg via INTRAVENOUS

## 2019-10-23 MED ORDER — CHLORHEXIDINE GLUCONATE 0.12 % MT SOLN
15.0000 mL | Freq: Once | OROMUCOSAL | Status: AC
  Administered 2019-10-23: 15 mL via OROMUCOSAL

## 2019-10-23 MED ORDER — SERTRALINE HCL 100 MG PO TABS
100.0000 mg | ORAL_TABLET | Freq: Every day | ORAL | Status: DC
  Administered 2019-10-23 – 2019-10-26 (×4): 100 mg via ORAL
  Filled 2019-10-23 (×4): qty 1

## 2019-10-23 MED ORDER — LIDOCAINE HCL (PF) 2 % IJ SOLN
INTRAMUSCULAR | Status: AC
  Filled 2019-10-23: qty 5

## 2019-10-23 MED ORDER — FENTANYL CITRATE (PF) 100 MCG/2ML IJ SOLN
25.0000 ug | INTRAMUSCULAR | Status: DC | PRN
  Administered 2019-10-23 (×5): 25 ug via INTRAVENOUS

## 2019-10-23 MED ORDER — ALBUMIN HUMAN 5 % IV SOLN: INTRAVENOUS | Status: DC | PRN

## 2019-10-23 MED ORDER — SUCCINYLCHOLINE CHLORIDE 200 MG/10ML IV SOSY
PREFILLED_SYRINGE | INTRAVENOUS | Status: AC
  Filled 2019-10-23: qty 10

## 2019-10-23 MED ORDER — ONDANSETRON HCL 4 MG/2ML IJ SOLN
INTRAMUSCULAR | Status: DC | PRN
  Administered 2019-10-23: 4 mg via INTRAVENOUS

## 2019-10-23 MED ORDER — KETOROLAC TROMETHAMINE 30 MG/ML IJ SOLN
INTRAMUSCULAR | Status: DC | PRN
  Administered 2019-10-23: 30 mg via INTRAVENOUS

## 2019-10-23 MED ORDER — ONDANSETRON HCL 4 MG/2ML IJ SOLN
INTRAMUSCULAR | Status: AC
  Filled 2019-10-23: qty 2

## 2019-10-23 MED ORDER — ACETAMINOPHEN 10 MG/ML IV SOLN
INTRAVENOUS | Status: AC
  Filled 2019-10-23: qty 100

## 2019-10-23 MED ORDER — QUETIAPINE FUMARATE 200 MG PO TABS
200.0000 mg | ORAL_TABLET | Freq: Every day | ORAL | Status: DC
  Administered 2019-10-23 – 2019-10-25 (×3): 200 mg via ORAL
  Filled 2019-10-23 (×4): qty 1

## 2019-10-23 MED ORDER — BUPIVACAINE HCL 0.5 % IJ SOLN
INTRAMUSCULAR | Status: DC | PRN
  Administered 2019-10-23: 20 mL

## 2019-10-23 MED ORDER — TRANEXAMIC ACID 1000 MG/10ML IV SOLN
INTRAVENOUS | Status: DC | PRN
  Administered 2019-10-23: 1000 mg via INTRAVENOUS

## 2019-10-23 MED ORDER — TRANEXAMIC ACID 1000 MG/10ML IV SOLN
INTRAVENOUS | Status: AC
  Filled 2019-10-23: qty 20

## 2019-10-23 MED ORDER — FENTANYL 50 MCG/ML IV PCA SOLN
INTRAVENOUS | Status: DC
  Filled 2019-10-23: qty 1000
  Filled 2019-10-23: qty 20

## 2019-10-23 MED ORDER — ALBUMIN HUMAN 5 % IV SOLN
INTRAVENOUS | Status: AC
  Filled 2019-10-23: qty 500

## 2019-10-23 MED ORDER — ORAL CARE MOUTH RINSE: 15.0000 mL | Freq: Once | OROMUCOSAL | Status: AC

## 2019-10-23 MED ORDER — DEXAMETHASONE SODIUM PHOSPHATE 10 MG/ML IJ SOLN
INTRAMUSCULAR | Status: DC | PRN
  Administered 2019-10-23: 10 mg via INTRAVENOUS

## 2019-10-23 MED ORDER — PROPOFOL 10 MG/ML IV BOLUS
INTRAVENOUS | Status: DC | PRN
  Administered 2019-10-23: 200 mg via INTRAVENOUS

## 2019-10-23 MED ORDER — DEXTROSE-NACL 5-0.45 % IV SOLN: INTRAVENOUS | Status: DC

## 2019-10-23 MED ORDER — SUGAMMADEX SODIUM 200 MG/2ML IV SOLN
INTRAVENOUS | Status: DC | PRN
  Administered 2019-10-23: 400 mg via INTRAVENOUS

## 2019-10-23 MED ORDER — BACLOFEN 10 MG PO TABS
10.0000 mg | ORAL_TABLET | Freq: Two times a day (BID) | ORAL | Status: DC
  Administered 2019-10-23 – 2019-10-26 (×6): 10 mg via ORAL
  Filled 2019-10-23 (×7): qty 1

## 2019-10-23 MED ORDER — DIPHENHYDRAMINE HCL 50 MG/ML IJ SOLN: 12.5000 mg | Freq: Four times a day (QID) | INTRAMUSCULAR | Status: DC | PRN

## 2019-10-23 MED ORDER — CEFAZOLIN SODIUM-DEXTROSE 2-4 GM/100ML-% IV SOLN
2.0000 g | INTRAVENOUS | Status: AC
  Administered 2019-10-23 (×2): 2 g via INTRAVENOUS

## 2019-10-23 MED ORDER — PROPOFOL 500 MG/50ML IV EMUL
INTRAVENOUS | Status: AC
  Filled 2019-10-23: qty 50

## 2019-10-23 MED ORDER — PHENYLEPHRINE HCL (PRESSORS) 10 MG/ML IV SOLN
INTRAVENOUS | Status: DC | PRN
  Administered 2019-10-23 (×2): 100 ug via INTRAVENOUS

## 2019-10-23 SURGICAL SUPPLY — 66 items
BAG URINE DRAIN 2000ML AR STRL (UROLOGICAL SUPPLIES) ×4 IMPLANT
BLADE SURG SZ10 CARB STEEL (BLADE) ×4 IMPLANT
BLADE SURG SZ11 CARB STEEL (BLADE) ×4 IMPLANT
CANISTER SUCT 1200ML W/VALVE (MISCELLANEOUS) ×4 IMPLANT
CATH FOLEY 2WAY  5CC 16FR (CATHETERS) ×2
CATH URTH 16FR FL 2W BLN LF (CATHETERS) ×2 IMPLANT
CHLORAPREP W/TINT 26 (MISCELLANEOUS) ×4 IMPLANT
COVER WAND RF STERILE (DRAPES) ×4 IMPLANT
DEFOGGER SCOPE WARMER CLEARIFY (MISCELLANEOUS) ×4 IMPLANT
DERMABOND ADVANCED (GAUZE/BANDAGES/DRESSINGS) ×2
DERMABOND ADVANCED .7 DNX12 (GAUZE/BANDAGES/DRESSINGS) ×2 IMPLANT
DEVICE SUTURE ENDOST 10MM (ENDOMECHANICALS) ×4 IMPLANT
DRSG OPSITE POSTOP 4X10 (GAUZE/BANDAGES/DRESSINGS) ×4 IMPLANT
GAUZE 4X4 16PLY RFD (DISPOSABLE) ×4 IMPLANT
GLOVE BIO SURGEON STRL SZ7 (GLOVE) ×16 IMPLANT
GLOVE BIOGEL PI IND STRL 6.5 (GLOVE) ×2 IMPLANT
GLOVE BIOGEL PI INDICATOR 6.5 (GLOVE) ×2
GLOVE INDICATOR 7.5 STRL GRN (GLOVE) ×12 IMPLANT
GLOVE SURG SYN 6.5 ES PF (GLOVE) ×4 IMPLANT
GOWN STRL REUS W/ TWL LRG LVL3 (GOWN DISPOSABLE) ×8 IMPLANT
GOWN STRL REUS W/ TWL XL LVL3 (GOWN DISPOSABLE) ×4 IMPLANT
GOWN STRL REUS W/TWL LRG LVL3 (GOWN DISPOSABLE) ×8
GOWN STRL REUS W/TWL XL LVL3 (GOWN DISPOSABLE) ×4
GRASPER SUT TROCAR 14GX15 (MISCELLANEOUS) ×4 IMPLANT
HANDLE YANKAUER SUCT BULB TIP (MISCELLANEOUS) ×4 IMPLANT
HOLDER FOLEY CATH W/STRAP (MISCELLANEOUS) ×4 IMPLANT
IRRIGATION STRYKERFLOW (MISCELLANEOUS) IMPLANT
IRRIGATOR STRYKERFLOW (MISCELLANEOUS)
IV LACTATED RINGERS 1000ML (IV SOLUTION) IMPLANT
IV NS 1000ML (IV SOLUTION) ×2
IV NS 1000ML BAXH (IV SOLUTION) ×2 IMPLANT
KIT PINK PAD W/HEAD ARE REST (MISCELLANEOUS) ×4
KIT PINK PAD W/HEAD ARM REST (MISCELLANEOUS) ×2 IMPLANT
LABEL OR SOLS (LABEL) ×4 IMPLANT
MANIPULATOR VCARE LG CRV RETR (MISCELLANEOUS) ×4 IMPLANT
MANIPULATOR VCARE SML CRV RETR (MISCELLANEOUS) IMPLANT
MANIPULATOR VCARE STD CRV RETR (MISCELLANEOUS) IMPLANT
NS IRRIG 1000ML POUR BTL (IV SOLUTION) ×4 IMPLANT
NS IRRIG 500ML POUR BTL (IV SOLUTION) ×4 IMPLANT
OCCLUDER COLPOPNEUMO (BALLOONS) IMPLANT
PACK GYN LAPAROSCOPIC (MISCELLANEOUS) ×4 IMPLANT
PAD OB MATERNITY 4.3X12.25 (PERSONAL CARE ITEMS) ×4 IMPLANT
PAD PREP 24X41 OB/GYN DISP (PERSONAL CARE ITEMS) ×4 IMPLANT
PENCIL ELECTRO HAND CTR (MISCELLANEOUS) ×4 IMPLANT
SCISSORS METZENBAUM CVD 33 (INSTRUMENTS) IMPLANT
SET CYSTO W/LG BORE CLAMP LF (SET/KITS/TRAYS/PACK) ×4 IMPLANT
SHEARS HARMONIC ACE PLUS 36CM (ENDOMECHANICALS) IMPLANT
SLEEVE ENDOPATH XCEL 5M (ENDOMECHANICALS) ×4 IMPLANT
SPONGE LAP 18X18 RF (DISPOSABLE) ×8 IMPLANT
STAPLER SKIN PROX 35W (STAPLE) ×4 IMPLANT
STRAP SAFETY 5IN WIDE (MISCELLANEOUS) ×4 IMPLANT
SURGILUBE 2OZ TUBE FLIPTOP (MISCELLANEOUS) ×4 IMPLANT
SUT ENDO VLOC 180-0-8IN (SUTURE) IMPLANT
SUT MNCRL 4-0 (SUTURE) ×4
SUT MNCRL 4-0 27XMFL (SUTURE) ×4
SUT PDS AB 1 TP1 96 (SUTURE) ×4 IMPLANT
SUT VIC AB 0 CT1 27 (SUTURE) ×6
SUT VIC AB 0 CT1 27XCR 8 STRN (SUTURE) ×6 IMPLANT
SUT VIC AB 0 CT1 36 (SUTURE) ×4 IMPLANT
SUT VICRYL 0 TIES 12 18 (SUTURE) ×4 IMPLANT
SUTURE MNCRL 4-0 27XMF (SUTURE) ×4 IMPLANT
SYR 10ML LL (SYRINGE) ×4 IMPLANT
SYR 50ML LL SCALE MARK (SYRINGE) ×4 IMPLANT
TROCAR ENDO BLADELESS 11MM (ENDOMECHANICALS) ×4 IMPLANT
TROCAR XCEL NON-BLD 5MMX100MML (ENDOMECHANICALS) ×4 IMPLANT
TUBING EVAC SMOKE HEATED PNEUM (TUBING) ×4 IMPLANT

## 2019-10-23 NOTE — Anesthesia Preprocedure Evaluation (Signed)
Anesthesia Evaluation  Patient identified by MRN, date of birth, ID band Patient awake    Reviewed: Allergy & Precautions, H&P , NPO status , Patient's Chart, lab work & pertinent test results, reviewed documented beta blocker date and time   Airway Mallampati: II  TM Distance: >3 FB Neck ROM: full    Dental  (+) Teeth Intact   Pulmonary neg shortness of breath, asthma , neg sleep apnea, neg recent URI, Current Smoker and Patient abstained from smoking.,    Pulmonary exam normal        Cardiovascular Exercise Tolerance: Good hypertension, On Medications negative cardio ROS Normal cardiovascular exam Rhythm:regular Rate:Normal     Neuro/Psych  Headaches, PSYCHIATRIC DISORDERS Depression  Neuromuscular disease    GI/Hepatic Neg liver ROS, GERD  Medicated,  Endo/Other  negative endocrine ROS  Renal/GU Renal disease  negative genitourinary   Musculoskeletal   Abdominal   Peds  Hematology  (+) Blood dyscrasia, anemia ,   Anesthesia Other Findings Past Medical History: No date: Asthma No date: Back pain No date: Chronic kidney disease     Comment:  frequent infrections No date: Depression No date: Dislocation of hip (HCC)     Comment:  right hip No date: GERD (gastroesophageal reflux disease) No date: Hypertension No date: Migraines No date: Pre-diabetes No date: Sickle cell trait ([place]) Past Surgical History: No date: CESAREAN SECTION     Comment:  2 No date: TUBAL LIGATION No date: upper teeth BMI    Body Mass Index: 40.10 kg/m     Reproductive/Obstetrics negative OB ROS                             Anesthesia Physical Anesthesia Plan  ASA: II  Anesthesia Plan: General ETT   Post-op Pain Management:    Induction:   PONV Risk Score and Plan:   Airway Management Planned:   Additional Equipment:   Intra-op Plan:   Post-operative Plan:   Informed Consent: I have  reviewed the patients History and Physical, chart, labs and discussed the procedure including the risks, benefits and alternatives for the proposed anesthesia with the patient or authorized representative who has indicated his/her understanding and acceptance.     Dental Advisory Given  Plan Discussed with: CRNA  Anesthesia Plan Comments:         Anesthesia Quick Evaluation

## 2019-10-23 NOTE — H&P (Signed)
Date of Initial H&P:10/17/2019  History reviewed, patient examined, no change in status, stable for surgery.

## 2019-10-23 NOTE — Brief Op Note (Signed)
10/23/2019  12:04 PM  PATIENT:  Kelly Byrd  44 y.o. female  PRE-OPERATIVE DIAGNOSIS:  AUB N93.9 Intramural fibroid D25.1  POST-OPERATIVE DIAGNOSIS:  AUB N93.9 Intramural fibroid D25.1  PROCEDURE:  Procedure(s): TOTAL LAPAROSCOPIC HYSTERECTOMY CONVERTED TO OPEN SUPRACERVICAL HYSTERECTOMY (Bilateral) CYSTOSCOPY (N/A)  SURGEON:  Surgeon(s) and Role:    Malachy Mood, MD - Primary    * Schuman, Stefanie Libel, MD - Assisting   ANESTHESIA:   general  EBL:  300 mL   BLOOD ADMINISTERED:none  DRAINS: Urinary Catheter (Foley)   LOCAL MEDICATIONS USED:  MARCAINE     SPECIMEN:  No Specimen  DISPOSITION OF SPECIMEN:  PATHOLOGY  COUNTS:  YES  TOURNIQUET:  * No tourniquets in log *  DICTATION: .Note written in EPIC  PLAN OF CARE: Admit to inpatient   PATIENT DISPOSITION:  PACU - hemodynamically stable.   Delay start of Pharmacological VTE agent (>24hrs) due to surgical blood loss or risk of bleeding: yes

## 2019-10-23 NOTE — Anesthesia Procedure Notes (Signed)
Procedure Name: Intubation Date/Time: 10/23/2019 7:47 AM Performed by: Justus Memory, CRNA Pre-anesthesia Checklist: Patient identified, Patient being monitored, Timeout performed, Emergency Drugs available and Suction available Patient Re-evaluated:Patient Re-evaluated prior to induction Oxygen Delivery Method: Circle system utilized Preoxygenation: Pre-oxygenation with 100% oxygen Induction Type: IV induction Ventilation: Mask ventilation without difficulty Laryngoscope Size: Mac, 3 and McGraph Grade View: Grade I Tube type: Oral Tube size: 7.0 mm Number of attempts: 1 Airway Equipment and Method: Stylet Placement Confirmation: ETT inserted through vocal cords under direct vision,  positive ETCO2 and breath sounds checked- equal and bilateral Secured at: 21 cm Tube secured with: Tape Dental Injury: Teeth and Oropharynx as per pre-operative assessment

## 2019-10-23 NOTE — Op Note (Signed)
Preoperative Diagnosis: 1) 44 y.o. with AUB and pelvic pain 2) 8cm intramural fibroid  Postoperative Diagnosis: 1) 44 y.o. with AUB and pelvic pain 2) 8cm intramural fibroid 3) Intra-abdominal adhesive disease  Operation Performed: Laparoscopic converted to open supracervical hysterectomy, lysis of adhesions >30 minutes, and cystoscopy  Indication:   Surgeon: Malachy Mood, MD  Assistant: Adrian Prows, MD this surgery required a high level surgical assistant with none other readily available  Anesthesia: General  Preoperative Antibiotics: 2g ancef redosed at 3-hr mark with an additional 2g ancef.  Estimated Blood Loss: 300 mL  IV Fluids: 271mL of albumin, and 1L of crystaloid  Urine Output:: 374mL  Drains or Tubes: foley to gravity drainage  Implants: none  Specimens Removed: Uterus  Complications: none  Intraoperative Findings: There were omental adhesions to the prior midline vertical abdominal incision.  The uterus was adhered to the pelvic sidewalls bilaterally limiting mobility and visualization on laparoscopy.  Dense adhesive disease of the bladder to the cervix.  Bilateral efflux of urine from both ureters and intact bladder dome on cystoscopy    Patient Condition: stable  Procedure in Detail:  Patient was taken to the operating room where she was administered general anesthesia.  She was positioned in the dorsal lithotomy position utilizing Allen stirups, prepped and draped in the usual sterile fashion.  Prior to proceeding with procedure a time out was performed.  Attention was turned to the patient's pelvis.  An indwelling foley catheter was placed to decompress the patient's bladder.  An operative speculum was placed to allow visualization of the cervix.  The anterior lip of the cervix was grasped with a single tooth tenaculum, and a large V-care uterine manipulator was placed to allow manipulation of the uterus.  The operative speculum and single tooth  tenaculum were then removed.  Attention was turned to the patient's abdomen.  The umbilicus was infiltrated with 1% Sensorcaine, before making a stab incision using an 11 blade scalpel.  A 10mm Excel trocar was then used to gain direct entry into the peritoneal cavity utilizing the camera to visualize progress of the trocar during placement.  Once peritoneal entry had been achieved, insufflation was started and pneumoperitoneum established at a pressure of 33mmHg.    One left and one right lower quadrant site were then injected with 1% Sensorcaine and a stab incision was made using an 11 blade scalpel.  Two additional 4mm Excel trocars were placed through these incisions under direct visualization.  The umbilical trocar was stepped up to an 16mm trocar. General inspection of the abdomen revealed the above noted findings.   Attention was turned to the omental adhesions which were taken down with a 39mm harmonic scalpel with Dr. Gilman Schmidt providing counter traction. Once the omental adhesions had been taken down it became apparent that the uterus had minimal mobility secondary to scarring bilaterally.  Decision was made after consulting with Dr. Gilman Schmidt that the most prudent approach would be to proceed with laparotomy.  A 15 blade scalpel was used to incise the abdominal skin utilizing the patient pre-existing midline incision.  This was carried down sharply to the level of the fascia.  The fascia was incised , tented up with a hemostat.   The midline was identified and entered bluntly.  Bowl was packed out of the pelvis utilizing three wet sponges.  A balfour retractor was placed.  The cornua were grasped with two Kocher clamps.  Attention was turned to the right adnexal structure.  The ovaries, tubes,  and round ligaments were adhere to the fundus.  The round ligament was clamped, transected, and suture ligated using a 0 Vicryl.  The right tube and ovary were then clamped, cut, and suture ligated using a fore and aft  stitch of 0 Vicryl.  The procedure was then repeat on the patient left adnexal structure in similar fashion.  Following dissection of the adnexal structures serial bites were taken down the mesosalpinx bilaterally which were suture ligated with Haney stitches of 0 Vicryl.  This afforded additional mobility and exposure.  A bladder flap was started using a bovie and Debakey forceps to tent up the anterior leaf of the broad and bladder.  The bladder did not mobilze well off the cervix.  At this point given starting hemoglobin, as well risk of urinary tract injury decision was made to proceed with a supracervical hysterectomy given normal preoperative endometrial biopsy as well as pap smear.  Two Kocher clamps were placed across the cervix at the level of the internal cervical os and the specimen was amputated using Jorgenson scissors.   The cervical stump was cauterized as was the internal cervical os.  The stump was then over sown using a running 0-Vicryl.  Pelvis was irrigated, all pedicles were inspected and noted to be hemostatic.  All packing and the self retaining retractor were removed.    The fascial edges were tagged with Kocker clamps, and the fascia was closed with a running +1 Looped PDS.  Following fascial closure the subcutaneous dead space was closed using a running 2-0 Vicryl.  Skin was closed using staples.  A 98mm trocar was reintroduced in the 96QI supraumbilical port site and pneumoperitoneum re-established.  A carter thompson device was then used to close the 64mm port site using a 0 Vicryl suture.  Pneumoperitoneum was evacuated and trocars were removed.  All trocar sites were then dressed with surgical skin glue.    The indwelling foley catheter was removed.  Cystoscopy was performed noting and intact bladder dome as well as brisk efflux of urine from bother ureteral orifices.  The cystoscopy was removed and the indwelling foley catheter was replaced.    Sponge needle and instrument counts  were correct time two.  The patient tolerated the procedure well and was taken to the recovery room in stable condition.

## 2019-10-23 NOTE — Telephone Encounter (Signed)
Pt having surgery today c AMS.

## 2019-10-23 NOTE — Transfer of Care (Signed)
Immediate Anesthesia Transfer of Care Note  Patient: Kelly Byrd  Procedure(s) Performed: TOTAL LAPAROSCOPIC HYSTERECTOMY WITH BILATERIAL SALPINGECTOMY CONVERTED TO OPEN SUPRACERVICAL HYSTERECTOMY (Bilateral ) CYSTOSCOPY (N/A )  Patient Location: PACU  Anesthesia Type:General  Level of Consciousness: drowsy  Airway & Oxygen Therapy: Patient Spontanous Breathing and Patient connected to face mask oxygen  Post-op Assessment: Report given to RN  Post vital signs: stable  Last Vitals:  Vitals Value Taken Time  BP    Temp    Pulse    Resp 15 10/23/19 1120  SpO2    Vitals shown include unvalidated device data.  Last Pain:  Vitals:   10/23/19 0620  TempSrc: Tympanic  PainSc: 9          Complications: No complications documented.

## 2019-10-24 ENCOUNTER — Encounter: Payer: Self-pay | Admitting: Obstetrics and Gynecology

## 2019-10-24 LAB — BASIC METABOLIC PANEL
Anion gap: 8 (ref 5–15)
BUN: 5 mg/dL — ABNORMAL LOW (ref 6–20)
CO2: 22 mmol/L (ref 22–32)
Calcium: 8.9 mg/dL (ref 8.9–10.3)
Chloride: 109 mmol/L (ref 98–111)
Creatinine, Ser: 0.69 mg/dL (ref 0.44–1.00)
GFR calc Af Amer: >60 mL/min (ref >60)
GFR calc non Af Amer: >60 mL/min (ref >60)
Glucose, Bld: 180 mg/dL — ABNORMAL HIGH (ref 70–99)
Potassium: 3.9 mmol/L (ref 3.5–5.1)
Sodium: 139 mmol/L (ref 135–145)

## 2019-10-24 LAB — CBC
HCT: 28 % — ABNORMAL LOW (ref 36.0–46.0)
Hemoglobin: 8.5 g/dL — ABNORMAL LOW (ref 12.0–15.0)
MCH: 19.5 pg — ABNORMAL LOW (ref 26.0–34.0)
MCHC: 30.4 g/dL (ref 30.0–36.0)
MCV: 64.1 fL — ABNORMAL LOW (ref 80.0–100.0)
Platelets: 505 10*3/uL — ABNORMAL HIGH (ref 150–400)
RBC: 4.37 MIL/uL (ref 3.87–5.11)
RDW: 20.6 % — ABNORMAL HIGH (ref 11.5–15.5)
WBC: 17.8 10*3/uL — ABNORMAL HIGH (ref 4.0–10.5)
nRBC: 0 % (ref 0.0–0.2)

## 2019-10-24 MED ORDER — SODIUM CHLORIDE 0.9 % IV SOLN: INTRAVENOUS | Status: DC

## 2019-10-24 MED ORDER — BACITRACIN-NEOMYCIN-POLYMYXIN 400-5-5000 EX OINT
TOPICAL_OINTMENT | Freq: Two times a day (BID) | CUTANEOUS | Status: DC
  Administered 2019-10-25 – 2019-10-26 (×3): 1 via TOPICAL
  Filled 2019-10-24 (×6): qty 1

## 2019-10-24 MED ORDER — HYDROMORPHONE HCL 2 MG PO TABS
2.0000 mg | ORAL_TABLET | ORAL | Status: DC | PRN
  Administered 2019-10-24: 2 mg via ORAL
  Administered 2019-10-24: 4 mg via ORAL
  Administered 2019-10-24: 2 mg via ORAL
  Administered 2019-10-25 – 2019-10-26 (×9): 4 mg via ORAL
  Filled 2019-10-24 (×2): qty 1
  Filled 2019-10-24 (×2): qty 2
  Filled 2019-10-24: qty 1
  Filled 2019-10-24 (×3): qty 2
  Filled 2019-10-24: qty 1
  Filled 2019-10-24 (×4): qty 2

## 2019-10-24 MED ORDER — IBUPROFEN 600 MG PO TABS
600.0000 mg | ORAL_TABLET | Freq: Four times a day (QID) | ORAL | Status: DC | PRN
  Administered 2019-10-24 – 2019-10-25 (×6): 600 mg via ORAL
  Filled 2019-10-24 (×6): qty 1

## 2019-10-24 MED ORDER — ACETAMINOPHEN 500 MG PO TABS
1000.0000 mg | ORAL_TABLET | Freq: Four times a day (QID) | ORAL | Status: DC | PRN
  Administered 2019-10-24 – 2019-10-26 (×6): 1000 mg via ORAL
  Filled 2019-10-24 (×6): qty 2

## 2019-10-24 NOTE — Progress Notes (Signed)
Subjective: Patient reports incisional pain and + flatus.  Pain is well-controlled on current  analgesic regimen..  Tolerating po: Yes tolerating full liquid.  Feels removal of foley and abdominal binder have made a difference overnight  Objective: Vital signs in last 24 hours: Temp:  [97.1 F (36.2 C)-98.6 F (37 C)] 98.2 F (36.8 C) (07/28 0423) Pulse Rate:  [77-91] 78 (07/28 0423) Resp:  [13-23] 17 (07/28 0720) BP: (113-167)/(66-104) 139/73 (07/28 0423) SpO2:  [95 %-100 %] 98 % (07/28 0720) Last BM Date: 10/22/19  Intake/Output from previous day: 07/27 0701 - 07/28 0700 In: 2795.7 [P.O.:240; I.V.:2105.7; IV Piggyback:450] Out: 3350 [Urine:3050; Blood:300]  Physical Examination: General: NAD Pulmonary: no increased work of breathing Abdomen: NABS, soft, non-tender, non-distended, incision D/C/I Extremities: no edema Neurologic: normal gait   Labs: Results for orders placed or performed during the hospital encounter of 10/23/19 (from the past 72 hour(s))  Urine Drug Screen, Qualitative ([facility] only)     Status: None   Collection Time: 10/23/19  6:16 AM  Result Value Ref Range   Tricyclic, Ur Screen NONE DETECTED NONE DETECTED   Amphetamines, Ur Screen NONE DETECTED NONE DETECTED   MDMA (Ecstasy)Ur Screen NONE DETECTED NONE DETECTED   Cocaine Metabolite,Ur [place] NONE DETECTED NONE DETECTED   Opiate, Ur Screen NONE DETECTED NONE DETECTED   Phencyclidine (PCP) Ur S NONE DETECTED NONE DETECTED   Cannabinoid 50 Ng, Ur [place] NONE DETECTED NONE DETECTED   Barbiturates, Ur Screen NONE DETECTED NONE DETECTED   Benzodiazepine, Ur Scrn NONE DETECTED NONE DETECTED   Methadone Scn, Ur NONE DETECTED NONE DETECTED    Comment: (NOTE) Tricyclics + metabolites, urine    Cutoff 1000 ng/mL Amphetamines + metabolites, urine  Cutoff 1000 ng/mL MDMA (Ecstasy), urine              Cutoff 500 ng/mL Cocaine Metabolite, urine          Cutoff 300 ng/mL Opiate + metabolites, urine        Cutoff 300  ng/mL Phencyclidine (PCP), urine         Cutoff 25 ng/mL Cannabinoid, urine                 Cutoff 50 ng/mL Barbiturates + metabolites, urine  Cutoff 200 ng/mL Benzodiazepine, urine              Cutoff 200 ng/mL Methadone, urine                   Cutoff 300 ng/mL  The urine drug screen provides only a preliminary, unconfirmed analytical test result and should not be used for non-medical purposes. Clinical consideration and professional judgment should be applied to any positive drug screen result due to possible interfering substances. A more specific alternate chemical method must be used in order to obtain a confirmed analytical result. Gas chromatography / mass spectrometry (GC/MS) is the preferred confirm atory method. Performed at [facility], [address] [place], [place] [address]   ABO/Rh     Status: None   Collection Time: 10/23/19  6:39 AM  Result Value Ref Range   ABO/RH(D)      B POS Performed at [facility], [address] [place], [place] [address]   Pregnancy, urine POC     Status: None   Collection Time: 10/23/19  7:17 AM  Result Value Ref Range   Preg Test, Ur NEGATIVE NEGATIVE    Comment:        THE SENSITIVITY  OF THIS METHODOLOGY IS >24 mIU/mL   Prepare RBC (crossmatch)     Status: None   Collection Time: 10/23/19  9:55 AM  Result Value Ref Range   Order Confirmation      ORDER PROCESSED BY BLOOD BANK Performed at [facility], [address] [place], [place] [address]   CBC     Status: Abnormal   Collection Time: 10/24/19  5:27 AM  Result Value Ref Range   WBC 17.8 (H) 4.0 - 10.5 K/uL   RBC 4.37 3.87 - 5.11 MIL/uL   Hemoglobin 8.5 (L) 12.0 - 15.0 g/dL    Comment: Reticulocyte Hemoglobin testing may be clinically indicated, consider ordering this additional test [id number]    HCT 28.0 (L) 36 - 46 %   MCV 64.1 (L) 80.0 - 100.0 fL   MCH 19.5 (L) 26.0 - 34.0 pg   MCHC 30.4 30.0 - 36.0 g/dL   RDW 20.6 (H) 11.5 -  15.5 %   Platelets 505 (H) 150 - 400 K/uL   nRBC 0.0 0.0 - 0.2 %    Comment: Performed at [facility], [address] [place], [place] [address]  Basic metabolic panel     Status: Abnormal   Collection Time: 10/24/19  5:27 AM  Result Value Ref Range   Sodium 139 135 - 145 mmol/L   Potassium 3.9 3.5 - 5.1 mmol/L   Chloride 109 98 - 111 mmol/L   CO2 22 22 - 32 mmol/L   Glucose, Bld 180 (H) 70 - 99 mg/dL    Comment: Glucose reference range applies only to samples taken after fasting for at least 8 hours.   BUN 5 (L) 6 - 20 mg/dL   Creatinine, Ser 0.69 0.44 - 1.00 mg/dL   Calcium 8.9 8.9 - 10.3 mg/dL   GFR calc non Af Amer >60 >60 mL/min   GFR calc Af Amer >60 >60 mL/min   Anion gap 8 5 - 15    Comment: Performed at [facility], [address] [place], [place] [address]     Assessment:  44 y.o. s/p 1 Day Post-Op Procedure(s) (LRB): TOTAL LAPAROSCOPIC HYSTERECTOMY CONVERTED TO OPEN SUPRACERVICAL HYSTERECTOMY (Bilateral) CYSTOSCOPY (N/A) : stable  Plan: 1) Pain: continue PCA as not narcotic naive and not tolerating general diet yet  2) Heme: acute on chronic blood loss anemia.  Hemodynamically stable and above transfusion threshold  3) FEN: Advance diet as tolerated.  Decrease IV fluid to 67mL/hr and switch to NS given BG this morning 180  4) Prophylaxis: intermittent pneumatic compression boots.  5) Disposition: Anticipate discharge to home POD2-3.  Malachy Mood, MD, [facility], [facility] 10/24/2019, 7:51 AM

## 2019-10-24 NOTE — Progress Notes (Signed)
Pt doing well today. Tolerating regular diet, passing gas, voiding adequately, and walked around nurses station x1 standby assist. PCA discontinued and wasted with Dewaine Conger RN. PO pain medications given as ordered. Pt updated on current plan of care.

## 2019-10-25 LAB — BASIC METABOLIC PANEL
Anion gap: 8 (ref 5–15)
BUN: 6 mg/dL (ref 6–20)
CO2: 24 mmol/L (ref 22–32)
Calcium: 8.4 mg/dL — ABNORMAL LOW (ref 8.9–10.3)
Chloride: 109 mmol/L (ref 98–111)
Creatinine, Ser: 0.56 mg/dL (ref 0.44–1.00)
GFR calc Af Amer: >60 mL/min (ref >60)
GFR calc non Af Amer: >60 mL/min (ref >60)
Glucose, Bld: 100 mg/dL — ABNORMAL HIGH (ref 70–99)
Potassium: 3.8 mmol/L (ref 3.5–5.1)
Sodium: 141 mmol/L (ref 135–145)

## 2019-10-25 LAB — CBC
HCT: 27.3 % — ABNORMAL LOW (ref 36.0–46.0)
Hemoglobin: 7.8 g/dL — ABNORMAL LOW (ref 12.0–15.0)
MCH: 19.2 pg — ABNORMAL LOW (ref 26.0–34.0)
MCHC: 28.6 g/dL — ABNORMAL LOW (ref 30.0–36.0)
MCV: 67.1 fL — ABNORMAL LOW (ref 80.0–100.0)
Platelets: 460 10*3/uL — ABNORMAL HIGH (ref 150–400)
RBC: 4.07 MIL/uL (ref 3.87–5.11)
RDW: 20.2 % — ABNORMAL HIGH (ref 11.5–15.5)
WBC: 10.7 10*3/uL — ABNORMAL HIGH (ref 4.0–10.5)
nRBC: 0 % (ref 0.0–0.2)

## 2019-10-25 LAB — SURGICAL PATHOLOGY

## 2019-10-25 MED ORDER — DIPHENHYDRAMINE HCL 25 MG PO CAPS
50.0000 mg | ORAL_CAPSULE | Freq: Four times a day (QID) | ORAL | Status: DC | PRN
  Administered 2019-10-25: 50 mg via ORAL
  Filled 2019-10-25: qty 2

## 2019-10-25 NOTE — Progress Notes (Signed)
Subjective: Pain is well-controlled on current  analgesic regimen, but still having some pain and discomfort with ambulation.  Tolerating po: Yes.  No nausea or emesis. Reports +flatus.    Objective: Vital signs in last 24 hours: Temp:  [97.8 F (36.6 C)-98.5 F (36.9 C)] 97.8 F (36.6 C) (07/29 0402) Pulse Rate:  [69-80] 69 (07/29 0402) Resp:  [17-22] 20 (07/29 0402) BP: (93-143)/(57-86) 93/57 (07/29 0402) SpO2:  [95 %-100 %] 95 % (07/29 0402) Last BM Date: 10/22/19 (passing gas)  Intake/Output from previous day: 07/28 0701 - 07/29 0700 In: 1118.7 [P.O.:480; I.V.:638.7] Out: 1050 [Urine:1050]  Physical Examination: General: NAD Pulmonary: no increased work of breathing Abdomen: NABS, soft, appropriately tender, non-distended, incision(s) D/C/I Extremities: no edema Neurologic: normal gait    Labs: Results for orders placed or performed during the hospital encounter of 10/23/19 (from the past 72 hour(s))  Urine Drug Screen, Qualitative ([facility] only)     Status: None   Collection Time: 10/23/19  6:16 AM  Result Value Ref Range   Tricyclic, Ur Screen NONE DETECTED NONE DETECTED   Amphetamines, Ur Screen NONE DETECTED NONE DETECTED   MDMA (Ecstasy)Ur Screen NONE DETECTED NONE DETECTED   Cocaine Metabolite,Ur [place] NONE DETECTED NONE DETECTED   Opiate, Ur Screen NONE DETECTED NONE DETECTED   Phencyclidine (PCP) Ur S NONE DETECTED NONE DETECTED   Cannabinoid 50 Ng, Ur [place] NONE DETECTED NONE DETECTED   Barbiturates, Ur Screen NONE DETECTED NONE DETECTED   Benzodiazepine, Ur Scrn NONE DETECTED NONE DETECTED   Methadone Scn, Ur NONE DETECTED NONE DETECTED    Comment: (NOTE) Tricyclics + metabolites, urine    Cutoff 1000 ng/mL Amphetamines + metabolites, urine  Cutoff 1000 ng/mL MDMA (Ecstasy), urine              Cutoff 500 ng/mL Cocaine Metabolite, urine          Cutoff 300 ng/mL Opiate + metabolites, urine        Cutoff 300 ng/mL Phencyclidine (PCP), urine         Cutoff 25  ng/mL Cannabinoid, urine                 Cutoff 50 ng/mL Barbiturates + metabolites, urine  Cutoff 200 ng/mL Benzodiazepine, urine              Cutoff 200 ng/mL Methadone, urine                   Cutoff 300 ng/mL  The urine drug screen provides only a preliminary, unconfirmed analytical test result and should not be used for non-medical purposes. Clinical consideration and professional judgment should be applied to any positive drug screen result due to possible interfering substances. A more specific alternate chemical method must be used in order to obtain a confirmed analytical result. Gas chromatography / mass spectrometry (GC/MS) is the preferred confirm atory method. Performed at [facility], [address] [place], [place] [address]   ABO/Rh     Status: None   Collection Time: 10/23/19  6:39 AM  Result Value Ref Range   ABO/RH(D)      B POS Performed at [facility], [address] [place], [place] [address]   Pregnancy, urine POC     Status: None   Collection Time: 10/23/19  7:17 AM  Result Value Ref Range   Preg Test, Ur NEGATIVE NEGATIVE    Comment:        THE SENSITIVITY OF THIS METHODOLOGY IS >24  mIU/mL   Prepare RBC (crossmatch)     Status: None   Collection Time: 10/23/19  9:55 AM  Result Value Ref Range   Order Confirmation      ORDER PROCESSED BY BLOOD BANK Performed at [facility], [address] [place], [place] [address]   CBC     Status: Abnormal   Collection Time: 10/24/19  5:27 AM  Result Value Ref Range   WBC 17.8 (H) 4.0 - 10.5 K/uL   RBC 4.37 3.87 - 5.11 MIL/uL   Hemoglobin 8.5 (L) 12.0 - 15.0 g/dL    Comment: Reticulocyte Hemoglobin testing may be clinically indicated, consider ordering this additional test [id number]    HCT 28.0 (L) 36 - 46 %   MCV 64.1 (L) 80.0 - 100.0 fL   MCH 19.5 (L) 26.0 - 34.0 pg   MCHC 30.4 30.0 - 36.0 g/dL   RDW 20.6 (H) 11.5 - 15.5 %   Platelets 505 (H) 150 - 400 K/uL   nRBC  0.0 0.0 - 0.2 %    Comment: Performed at [facility], [address], [place], [place] [address]  Basic metabolic panel     Status: Abnormal   Collection Time: 10/24/19  5:27 AM  Result Value Ref Range   Sodium 139 135 - 145 mmol/L   Potassium 3.9 3.5 - 5.1 mmol/L   Chloride 109 98 - 111 mmol/L   CO2 22 22 - 32 mmol/L   Glucose, Bld 180 (H) 70 - 99 mg/dL    Comment: Glucose reference range applies only to samples taken after fasting for at least 8 hours.   BUN 5 (L) 6 - 20 mg/dL   Creatinine, Ser 0.69 0.44 - 1.00 mg/dL   Calcium 8.9 8.9 - 10.3 mg/dL   GFR calc non Af Amer >60 >60 mL/min   GFR calc Af Amer >60 >60 mL/min   Anion gap 8 5 - 15    Comment: Performed at [facility], [address] [place], [place] [address]  CBC     Status: Abnormal   Collection Time: 10/25/19  5:47 AM  Result Value Ref Range   WBC 10.7 (H) 4.0 - 10.5 K/uL   RBC 4.07 3.87 - 5.11 MIL/uL   Hemoglobin 7.8 (L) 12.0 - 15.0 g/dL    Comment: Reticulocyte Hemoglobin testing may be clinically indicated, consider ordering this additional test [id number]    HCT 27.3 (L) 36 - 46 %   MCV 67.1 (L) 80.0 - 100.0 fL   MCH 19.2 (L) 26.0 - 34.0 pg   MCHC 28.6 (L) 30.0 - 36.0 g/dL   RDW 20.2 (H) 11.5 - 15.5 %   Platelets 460 (H) 150 - 400 K/uL   nRBC 0.0 0.0 - 0.2 %    Comment: Performed at [facility], [address], [place], [place] [address]  Basic metabolic panel     Status: Abnormal   Collection Time: 10/25/19  5:47 AM  Result Value Ref Range   Sodium 141 135 - 145 mmol/L   Potassium 3.8 3.5 - 5.1 mmol/L   Chloride 109 98 - 111 mmol/L   CO2 24 22 - 32 mmol/L   Glucose, Bld 100 (H) 70 - 99 mg/dL    Comment: Glucose reference range applies only to samples taken after fasting for at least 8 hours.   BUN 6 6 - 20 mg/dL   Creatinine, Ser 0.56 0.44 - 1.00 mg/dL   Calcium 8.4 (L) 8.9 - 10.3  mg/dL   GFR calc non Af Amer >60 >60 mL/min   GFR calc Af Amer >60 >60 mL/min   Anion  gap 8 5 - 15    Comment: Performed at [facility], [address] [place], [place] [address]     Assessment:  44 y.o. s/p 2 Days Post-Op Procedure(s) (LRB): TOTAL LAPAROSCOPIC HYSTERECTOMY CONVERTED TO OPEN SUPRACERVICAL HYSTERECTOMY (Bilateral) CYSTOSCOPY (N/A) : stable  Plan: 1) Pain: doing well with transition to po dilaudid - still some pain and discomfort with ambulatin - will have evaluated by OT for possible bedside commode or need for walker at home  2) Heme: acute on chronic blood loss anemia, hemodynamically stable and afebrile  3) FEN: advanced diet to general yesterday, lost IV access  4) Prophylaxis: intermittent pneumatic compression boots., ambulation  5) Disposition: Anticipate discharge POD3.  Malachy Mood, MD, [facility], [facility] 10/25/2019, 7:14 AM

## 2019-10-25 NOTE — Evaluation (Signed)
Occupational Therapy Evaluation Patient Details Name: Kelly Byrd MRN: [id number] DOB: 1975/10/14 Today's Date: 10/25/2019    History of Present Illness Kelly Byrd is a 44 y.o. female s/p open supracervical hysterectomy, and cystoscopy. PMH includes: angioedema, HTN, asthma, GERD, depression, anxiety sickle cell trait, and GERD.   Clinical Impression   Kelly Byrd was seen for OT evaluation this date. Pt received long-sitting in bed, reporting 10/10 pain. Despite being pain limited pt agreeable to OT session. She is generally oriented t/o session, but requires consistent re-direction to task/topic at hand and provides some conflicting information re: PMH/PLOF/home set-up. Pt states during session  "I have bipolar disorder, anxiety, depression and schizophrenia". Unable to verify all of these diagnoses in EMR. No family/caregiver present to verify information re: PLOF/home set-up. Pt reports that she lives with her spouse in a 1 level house with 6 STE and bilateral hand rails. She endorses having a PCA 7 days/week to assist her with BADL management including bathing and dressing. She states her level of assist varies from day to day, but she generally requires at least some assistance for bathing and lower body dressing. She endorses multiple falls in the past 6 months including "at least 5 in the last month" in all areas of her home. Pt denies use of any AE for functional mobility.  Pt presents with increased pain, decreased activity tolerance, decreased safety awareness, and decreased awareness of deficits, which functionally limit her ability to perform ADL/self-care tasks. Pt currently requires supervision to CGA for functional mobility, MAX A for LB ADL management in a seated position, and set-up/supervision assist for seated UB ADL management. Pt educated on safe use of AE/DME (including safe use of RW and BSC) for ADL management, and routines modifications to support safety and  functional independence upon hospital DC. At one point during session, pt calls an individual she she identifies as her personal care attendant and requests this Kelly Byrd go over AE/DME education. Caregiver verbalizes understanding. Pt then hangs up with this person, but later is observed to continue speaking with her as if she had not hung up. Pt would benefit from skilled OT to address noted impairments and functional limitations (see below for any additional details) in order to maximize safety and independence while minimizing falls risk and caregiver burden. Upon hospital discharge, recommend HHOT to maximize pt safety and return to functional independence during meaningful occupations of daily life.    Follow Up Recommendations  Home health OT;Supervision - Intermittent    Equipment Recommendations  3 in 1 bedside commode    Recommendations for Other Services       Precautions / Restrictions Precautions Precautions: Fall Precaution Comments: Moderate Fall Restrictions Weight Bearing Restrictions: No      Mobility Bed Mobility Overal bed mobility: Modified Independent             General bed mobility comments: Pt comes to sitting at EOB with HOB elevated and moderate increased time/effort to perform.  Transfers Overall transfer level: Needs assistance Equipment used: 1 person hand held assist Transfers: Sit to/from Stand Sit to Stand: Min guard;Supervision         General transfer comment: Pt ambulates in room with close supervision to min guard for safety.    Balance Overall balance assessment: History of Falls;Needs assistance Sitting-balance support: Feet supported;No upper extremity supported Sitting balance-Leahy Scale: Good Sitting balance - Comments: Steady static sitting, reaching within BOS.   Standing balance support: During functional activity;Single extremity supported;No upper  extremity supported Standing balance-Leahy Scale: Good                              ADL either performed or assessed with clinical judgement   ADL Overall ADL's : Needs assistance/impaired                                       General ADL Comments: Pt is functionally limited by increased abdominal pain, decreased activity tolerance, and generalized weakness. She requires supervision for safety during functional mobility. MAX A to don bilat hospital socks at bed level, as pt is unable to reach feet w/o causing significant discomfort. Pt would benefit from further education on AE to support safety, comfort, and functional independence.     Vision         Perception     Praxis      Pertinent Vitals/Pain Pain Assessment: 0-10 Pain Score: 10-Worst pain ever Pain Location: abdomen, low back, legs. Pain Descriptors / Indicators: Constant;Grimacing;Operative site guarding;Sore Pain Intervention(s): Limited activity within patient's tolerance;Monitored during session;Repositioned;Patient requesting pain meds-RN notified;Premedicated before session     Hand Dominance Right   Extremity/Trunk Assessment Upper Extremity Assessment Upper Extremity Assessment: Generalized weakness   Lower Extremity Assessment Lower Extremity Assessment: Generalized weakness   Cervical / Trunk Assessment Cervical / Trunk Assessment: Normal   Communication Communication Communication: No difficulties   Cognition Arousal/Alertness: Awake/alert Behavior During Therapy: WFL for tasks assessed/performed Overall Cognitive Status: No family/caregiver present to determine baseline cognitive functioning                                 General Comments: Pt generally A&O x 4, however occasionally tangential and requires re-direction to task/topic at hand. She endorses multiple mental heath challenges including schizophrenia, bipolar disorder, and depression during session. No family/caregiver present to determine baseline.   General Comments        Exercises Other Exercises Other Exercises: Pt educated on role of OT in acute setting, safe use of AE/DME for ADL management, falls prevention strategies for home and hospital and routines modifications to support safety and functional independence upon hospital DC. Would benefit from further review and opportunity to trial RW for functional mobility.   Shoulder Instructions      Home Living Family/patient expects to be discharged to:: Private residence Living Arrangements: Spouse/significant other Available Help at Discharge: Family;Personal care attendant;Available PRN/intermittently (Pt reports PCA visits 7 days/week) Type of Home: House Home Access: Stairs to enter [employer] of Steps: 6 Entrance Stairs-Rails: Left;Right (cannot reach both.) Home Layout: One level     Bathroom Shower/Tub: Teacher, early years/pre: Standard     Home Equipment: Environmental consultant - 2 wheels;Adaptive equipment;Hand held shower head Adaptive Equipment: Long-handled sponge Additional Comments: Pt provides some conflicting information re: equipment in the home. No family/caregiver present to determine baseline.      Prior Functioning/Environment Level of Independence: Needs assistance  Gait / Transfers Assistance Needed: Pt denies use of AE for mobility, however, she endorses 5+ falls in the past month in multiple areas of the home. ADL's / Homemaking Assistance Needed: Pt states her level of assist varies by day. She is at times mod I for ADL management using a LH sponge to bathe, other times she requires assist from  her PCA.   Comments: Pt provides some conflicting information re: equipment in the home, PLOF, etc. No family/caregiver present to determine baseline.        OT Problem List: Decreased strength;Decreased coordination;Pain;Decreased range of motion;Decreased activity tolerance;Decreased safety awareness;Impaired balance (sitting and/or standing);Decreased knowledge of  use of DME or AE      OT Treatment/Interventions: Self-care/ADL training;Therapeutic exercise;Therapeutic activities;DME and/or AE instruction;Patient/family education;Balance training    OT Goals(Current goals can be found in the care plan section) Acute Rehab OT Goals Patient Stated Goal: To have less pain OT Goal Formulation: With patient Time For Goal Achievement: 11/08/19 Potential to Achieve Goals: Good ADL Goals Pt Will Perform Lower Body Dressing: with set-up;with supervision;with caregiver independent in assisting;sit to/from stand (c LRAD PRN for improved safety and functional indep.) Pt Will Transfer to Toilet: ambulating;bedside commode;with supervision;with set-up Pt Will Perform Toileting - Clothing Manipulation and hygiene: sit to/from stand;with modified independence;with adaptive equipment (c LRAD PRN for improved safety and functional indep.) Additional ADL Goal #1: Pt will independently demonstrate safe use of AE for functional mobility prior to DC to maximize safety and functional independence upon return home.  OT Frequency: Min 1X/week   Barriers to D/C: Decreased caregiver support;Inaccessible home environment          Co-evaluation              AM-PAC OT "6 Clicks" Daily Activity     Outcome Measure Help from another person eating meals?: None Help from another person taking care of personal grooming?: A Little Help from another person toileting, which includes using toliet, bedpan, or urinal?: A Little Help from another person bathing (including washing, rinsing, drying)?: A Little Help from another person to put on and taking off regular upper body clothing?: A Little Help from another person to put on and taking off regular lower body clothing?: A Little 6 Click Score: 19   End of Session Equipment Utilized During Treatment: Gait belt Nurse Communication: Mobility status;Patient requests pain meds  Activity Tolerance: Patient tolerated treatment  well Patient left: in bed;with call bell/phone within reach;with SCD's reapplied (Pt recieved with bed alarm off.)  OT Visit Diagnosis: Other abnormalities of gait and mobility (R26.89);Muscle weakness (generalized) (M62.81);Repeated falls (R29.6)                Time: 2683-4196 OT Time Calculation (min): 32 min Charges:  OT General Charges $OT Visit: 1 Visit OT Evaluation $OT Eval Moderate Complexity: 1 Mod OT Treatments $Self Care/Home Management : 23-37 mins  Shara Blazing, M.S., OTR/L Ascom: [phone] 10/25/19, 2:55 PM

## 2019-10-25 NOTE — Anesthesia Postprocedure Evaluation (Signed)
Anesthesia Post Note  Patient: Kelly Byrd  Procedure(s) Performed: TOTAL LAPAROSCOPIC HYSTERECTOMY CONVERTED TO OPEN SUPRACERVICAL HYSTERECTOMY (Bilateral ) CYSTOSCOPY (N/A )  Patient location during evaluation: PACU Anesthesia Type: General Level of consciousness: awake and alert Pain management: pain level controlled Vital Signs Assessment: post-procedure vital signs reviewed and stable Respiratory status: spontaneous breathing, nonlabored ventilation, respiratory function stable and patient connected to nasal cannula oxygen Cardiovascular status: blood pressure returned to baseline and stable Postop Assessment: no apparent nausea or vomiting Anesthetic complications: no   No complications documented.   Last Vitals:  Vitals:   10/25/19 0832 10/25/19 1103  BP: (!) 132/78 (!) 139/86  Pulse: 79 84  Resp: 16 18  Temp: 37.1 C 36.7 C  SpO2: 100%     Last Pain:  Vitals:   10/25/19 1113  TempSrc:   PainSc: [name]

## 2019-10-26 LAB — BASIC METABOLIC PANEL
Anion gap: 7 (ref 5–15)
BUN: 10 mg/dL (ref 6–20)
CO2: 26 mmol/L (ref 22–32)
Calcium: 8.7 mg/dL — ABNORMAL LOW (ref 8.9–10.3)
Chloride: 108 mmol/L (ref 98–111)
Creatinine, Ser: 0.84 mg/dL (ref 0.44–1.00)
GFR calc Af Amer: >60 mL/min (ref >60)
GFR calc non Af Amer: >60 mL/min (ref >60)
Glucose, Bld: 110 mg/dL — ABNORMAL HIGH (ref 70–99)
Potassium: 3.7 mmol/L (ref 3.5–5.1)
Sodium: 141 mmol/L (ref 135–145)

## 2019-10-26 LAB — CBC
HCT: 27.8 % — ABNORMAL LOW (ref 36.0–46.0)
Hemoglobin: 7.9 g/dL — ABNORMAL LOW (ref 12.0–15.0)
MCH: 19.3 pg — ABNORMAL LOW (ref 26.0–34.0)
MCHC: 28.4 g/dL — ABNORMAL LOW (ref 30.0–36.0)
MCV: 68 fL — ABNORMAL LOW (ref 80.0–100.0)
Platelets: 467 10*3/uL — ABNORMAL HIGH (ref 150–400)
RBC: 4.09 MIL/uL (ref 3.87–5.11)
RDW: 20.6 % — ABNORMAL HIGH (ref 11.5–15.5)
WBC: 14 10*3/uL — ABNORMAL HIGH (ref 4.0–10.5)
nRBC: 0 % (ref 0.0–0.2)

## 2019-10-26 MED ORDER — HYDROMORPHONE HCL 2 MG PO TABS: 2.0000 mg | ORAL_TABLET | ORAL | 0 refills | Status: AC | PRN

## 2019-10-26 NOTE — Progress Notes (Signed)
Patient discharged home. Discharge instructions and prescriptions given and reviewed with patient. Patient verbalized understanding.   Pt had already called for ride after talking with Dr. Georgianne Fick and nurse did not realize PT had not actually seen pt.   Nurse reviewed all medications in depth and wound care at home.   Scheduled follow-up appointment for Friday, August 6th at 10:30am with Dr. Georgianne Fick at [facility]. He will provide wound care and remove staples at this appointment.  Escorted out by staff.  Pt called back from the [place] entrance and wanted the bedside commode that PT had promised her. Nurse coordinated with social work and PT to get the bedside commode to pt.

## 2019-10-26 NOTE — Progress Notes (Signed)
  Went to assess patient for prn breathing treatment .  Patient states she was wheezing earlier.  Patient is clear at present and saturations are 100% on RA.  Patient states she has just done her bronchodilator inhaler before I came in the room.  Will not administer breathing treatment at this time,  RN made aware of all that transpired while in patient's room.

## 2019-10-26 NOTE — Discharge Summary (Signed)
Physician Discharge Summary  Patient ID: Kelly Byrd MRN: [id number] DOB/AGE: [birth year] 44 y.o.  Admit date: 10/23/2019 Discharge date: 10/26/2019  Admission Diagnoses:  Discharge Diagnoses:  Active Problems:   Intramural leiomyoma of uterus   S/P abdominal supracervical subtotal hysterectomy   Abdominal adhesions   Discharged Condition: stable  Hospital Course: 44 y.o. [id number] POD3 abdominal supracervical hysterectomy fibroid uterus and adenomyosis.  Patient initially scheduled for laparoscopic approach but conversion to midline laparotomy given adhesive disease.  The patient was initially managed with a fentanyl PCA, she displayed good po tolerance and was passing faltus and was transition to po dilaudid on the evening of POD1.  The patient remained hemodynamically stable and afebrile throughout admission.  She uses home health at baseline. History of frequent falls at home.  Was evaluated by PT for bedside commode at home, as well as OT for any additional services she may require in the postoperative period.    Consults: None  Significant Diagnostic Studies:  Results for orders placed or performed during the hospital encounter of 10/23/19 (from the past 72 hour(s))  Prepare RBC (crossmatch)     Status: None   Collection Time: 10/23/19  9:55 AM  Result Value Ref Range   Order Confirmation      ORDER PROCESSED BY BLOOD BANK Performed at [facility], [address] [place], [place] [address]   Surgical pathology     Status: None   Collection Time: 10/23/19 10:45 AM  Result Value Ref Range   SURGICAL PATHOLOGY      SURGICAL PATHOLOGY CASE: [id number] PATIENT: Hosie Poisson Surgical Pathology Report     Specimen Submitted: A. Uterus  Clinical History: AUB N93.9, intramural fibroid D25.1    DIAGNOSIS: A. UTERUS; SUPRACERVICAL HYSTERECTOMY: - ENDOMETRIUM:      - SECRETORY PHASE ENDOMETRIUM.      - NEGATIVE FOR ATYPICAL HYPERPLASIA/EIN AND  MALIGNANCY. - MYOMETRIUM:      - ADENOMYOSIS.      - LEIOMYOMATA UTERI.      - NEGATIVE FOR FEATURES OF MALIGNANCY.  GROSS DESCRIPTION: A. Labeled: Uterus Received: Formalin Weight: 560.27 grams Dimensions:      Fundus -10.7 (superior to inferior) x 10.5 (cornu to cornu) x 10.4 (anterior to posterior) cm      Cervix -none grossly identified Serosa: The serosa is tan-pink, smooth, and glistening.  A distinct orientation between anterior and posterior cannot be determined. Endometrial cavity:      Dimensions -6.8 (superior to inferior) x 5.2 (cornu to cornu) cm      Thickness -0.1 cm      Other findings -the endometrium is t an-red and finely granular. Myometrium:     Thickness -up to 4.8 cm     Other findings -the myometrium is tan-pink with 8 intramural nodules ranging from 0.3 to 7.6 cm in greatest dimension.  The nodules have a tan-white, whorled, firm, and well-circumscribed cut surface.  The largest nodule has a focal area of calcifications.  One of the smaller nodules has areas of tan-brown discoloration. Other comments: None grossly identified  Block summary: 1 - representative lower uterine segment, side A 2 - representative lower uterine segment, side B 3 - representative transmural endomyometrium, side A 4 - representative transmural endomyometrium, side B 5 - 6 - representative intramural nodules 7 - representative nodule with discolorations (1/cm) 8 - 10 - representative nodule with calcifications (1/cm)      8 - following decalcification    Final Diagnosis performed by Allena Napoleon,  MD.   Electronically signed 10/25/2019 9:51:38AM The electronic signature indicates that  the named Attending Pathologist has evaluated the specimen Technical component performed at [facility], [address], [place], [place] [address] Lab: [phone] Dir: Rush Farmer, MD, MMM  Professional component performed at [facility], [facility], [place],  [place], [place] [address] Lab: [phone] Dir: Dellia Nims. Rubinas, MD   CBC     Status: Abnormal   Collection Time: 10/24/19  5:27 AM  Result Value Ref Range   WBC 17.8 (H) 4.0 - 10.5 K/uL   RBC 4.37 3.87 - 5.11 MIL/uL   Hemoglobin 8.5 (L) 12.0 - 15.0 g/dL    Comment: Reticulocyte Hemoglobin testing may be clinically indicated, consider ordering this additional test [id number]    HCT 28.0 (L) 36 - 46 %   MCV 64.1 (L) 80.0 - 100.0 fL   MCH 19.5 (L) 26.0 - 34.0 pg   MCHC 30.4 30.0 - 36.0 g/dL   RDW 20.6 (H) 11.5 - 15.5 %   Platelets 505 (H) 150 - 400 K/uL   nRBC 0.0 0.0 - 0.2 %    Comment: Performed at [facility], [address], [place], [place] [address]  Basic metabolic panel     Status: Abnormal   Collection Time: 10/24/19  5:27 AM  Result Value Ref Range   Sodium 139 135 - 145 mmol/L   Potassium 3.9 3.5 - 5.1 mmol/L   Chloride 109 98 - 111 mmol/L   CO2 22 22 - 32 mmol/L   Glucose, Bld 180 (H) 70 - 99 mg/dL    Comment: Glucose reference range applies only to samples taken after fasting for at least 8 hours.   BUN 5 (L) 6 - 20 mg/dL   Creatinine, Ser 0.69 0.44 - 1.00 mg/dL   Calcium 8.9 8.9 - 10.3 mg/dL   GFR calc non Af Amer >60 >60 mL/min   GFR calc Af Amer >60 >60 mL/min   Anion gap 8 5 - 15    Comment: Performed at [facility], [address] [place], [place] [address]  CBC     Status: Abnormal   Collection Time: 10/25/19  5:47 AM  Result Value Ref Range   WBC 10.7 (H) 4.0 - 10.5 K/uL   RBC 4.07 3.87 - 5.11 MIL/uL   Hemoglobin 7.8 (L) 12.0 - 15.0 g/dL    Comment: Reticulocyte Hemoglobin testing may be clinically indicated, consider ordering this additional test [id number]    HCT 27.3 (L) 36 - 46 %   MCV 67.1 (L) 80.0 - 100.0 fL   MCH 19.2 (L) 26.0 - 34.0 pg   MCHC 28.6 (L) 30.0 - 36.0 g/dL   RDW 20.2 (H) 11.5 - 15.5 %   Platelets 460 (H) 150 - 400 K/uL   nRBC 0.0 0.0 - 0.2 %    Comment: Performed at [facility], [address], [place], [place] [address]  Basic metabolic panel     Status: Abnormal   Collection Time: 10/25/19  5:47 AM  Result Value Ref Range   Sodium 141 135 - 145 mmol/L   Potassium 3.8 3.5 - 5.1 mmol/L   Chloride 109 98 - 111 mmol/L   CO2 24 22 - 32 mmol/L   Glucose, Bld 100 (H) 70 - 99 mg/dL    Comment: Glucose reference range applies only to samples taken after fasting for at least 8 hours.   BUN 6 6 - 20 mg/dL   Creatinine, Ser  0.56 0.44 - 1.00 mg/dL   Calcium 8.4 (L) 8.9 - 10.3 mg/dL   GFR calc non Af Amer >60 >60 mL/min   GFR calc Af Amer >60 >60 mL/min   Anion gap 8 5 - 15    Comment: Performed at [facility], [address] [place], [place] [address]  CBC     Status: Abnormal   Collection Time: 10/26/19  6:01 AM  Result Value Ref Range   WBC 14.0 (H) 4.0 - 10.5 K/uL   RBC 4.09 3.87 - 5.11 MIL/uL   Hemoglobin 7.9 (L) 12.0 - 15.0 g/dL    Comment: Reticulocyte Hemoglobin testing may be clinically indicated, consider ordering this additional test [id number]    HCT 27.8 (L) 36 - 46 %   MCV 68.0 (L) 80.0 - 100.0 fL   MCH 19.3 (L) 26.0 - 34.0 pg   MCHC 28.4 (L) 30.0 - 36.0 g/dL   RDW 20.6 (H) 11.5 - 15.5 %   Platelets 467 (H) 150 - 400 K/uL   nRBC 0.0 0.0 - 0.2 %    Comment: Performed at [facility], [address], [place], [place] [address]  Basic metabolic panel     Status: Abnormal   Collection Time: 10/26/19  6:01 AM  Result Value Ref Range   Sodium 141 135 - 145 mmol/L   Potassium 3.7 3.5 - 5.1 mmol/L   Chloride 108 98 - 111 mmol/L   CO2 26 22 - 32 mmol/L   Glucose, Bld 110 (H) 70 - 99 mg/dL    Comment: Glucose reference range applies only to samples taken after fasting for at least 8 hours.   BUN 10 6 - 20 mg/dL   Creatinine, Ser 0.84 0.44 - 1.00 mg/dL   Calcium 8.7 (L) 8.9 - 10.3 mg/dL   GFR calc non Af Amer >60 >60 mL/min   GFR calc Af Amer >60 >60 mL/min   Anion gap 7 5 - 15    Comment: Performed at [facility], [address] [place], [place] [address]     Treatments: surgery: laparotomy with lysis of adhesions, supracervical hysterectomy, and cystoscopy 10/23/2019  Discharge Exam: Blood pressure (!) 131/69, pulse 81, temperature 97.9 F (36.6 C), temperature source Oral, resp. rate 18, weight (!) 109.3 kg, last menstrual period 10/04/2019, SpO2 97 %. General appearance: alert, appears stated age and no distress Resp: clear to auscultation bilaterally Cardio: RRR GI: NABS, soft, appropriately tender Extremities: extremities normal, atraumatic, no cyanosis or edema Incision/Wound: D/C/I  Disposition: Discharge disposition: 01-Home or Self Care       Discharge Instructions    Call MD for:   Complete by: As directed    Heavy vaginal bleeding greater than 1 pad an hour   Call MD for:  difficulty breathing, headache or visual disturbances   Complete by: As directed    Call MD for:  extreme fatigue   Complete by: As directed    Call MD for:  hives   Complete by: As directed    Call MD for:  persistant dizziness or light-headedness   Complete by: As directed    Call MD for:  persistant nausea and vomiting   Complete by: As directed    Call MD for:  redness, tenderness, or signs of infection (pain, swelling, redness, odor or green/yellow discharge around incision site)   Complete by: As directed    Call MD for:  severe uncontrolled pain   Complete by: As directed  Call MD for:  temperature >100.4   Complete by: As directed    Diet general   Complete by: As directed    Discharge wound care:   Complete by: As directed    You may apply a light dressing for minor discharge from the incision or to keep waistbands of clothing from rubbing.  You may also have been discharge with a clear dressing in which case this will be removed at your postoperative clinic visit.  You may shower, use soap on your incision.  Avoid baths or soaking the incision in the first 6 weeks following your surgery..   Driving  restriction   Complete by: As directed    Avoid driving for at least 2 weeks or while taking prescription narcotics.   Lifting restrictions   Complete by: As directed    Weight restriction of 10lbs for 6 weeks.     Allergies as of 10/26/2019      Reactions   Ace Inhibitors Swelling   Throat and face   Lisinopril Other (See Comments)   Swelling--caused the pt to stay in the hospital   Peanuts [peanut Oil] Other (See Comments)   Swelling--caused the pt to stay in the hospital      Medication List    TAKE these medications   albuterol 108 (90 Base) MCG/ACT inhaler Commonly known as: VENTOLIN HFA Inhale 2 puffs into the lungs every 6 (six) hours as needed for wheezing or shortness of breath (wheezing).   amLODipine 10 MG tablet Commonly known as: NORVASC Take 1 tablet (10 mg total) by mouth daily.   atorvastatin 20 MG tablet Commonly known as: LIPITOR Take 1 tablet (20 mg total) by mouth daily.   baclofen 10 MG tablet Commonly known as: LIORESAL Take 10 mg by mouth in the morning and at bedtime.   celecoxib 100 MG capsule Commonly known as: CELEBREX Take 100 mg by mouth daily.   diclofenac Sodium 1 % Gel Commonly known as: VOLTAREN Apply 1 application topically 4 (four) times daily as needed (pain.).   diphenhydrAMINE 50 MG tablet Commonly known as: BENADRYL Take 1 tablet (50 mg total) by mouth every 8 (eight) hours as needed for allergies. What changed: when to take this   EPINEPHrine 0.3 mg/0.3 mL Soaj injection Commonly known as: EPI-PEN Inject 0.3 mLs (0.3 mg total) into the muscle as needed for anaphylaxis.   famotidine 20 MG tablet Commonly known as: PEPCID Take 1 tablet (20 mg total) by mouth 2 (two) times daily.   gabapentin 300 MG capsule Commonly known as: NEURONTIN Take 2 capsules (600 mg total) by mouth 2 (two) times daily.   HYDROmorphone 2 MG tablet Commonly known as: DILAUDID Take 1-2 tablets (2-4 mg total) by mouth every 3 (three) hours as  needed for up to 7 days for moderate pain or severe pain (postoperative pain).   montelukast 10 MG tablet Commonly known as: SINGULAIR Take 1 tablet (10 mg total) by mouth at bedtime.   ondansetron 4 MG disintegrating tablet Commonly known as: Zofran ODT Take 1 tablet (4 mg total) by mouth every 8 (eight) hours as needed for nausea or vomiting.   oxyCODONE-acetaminophen 7.5-325 MG tablet Commonly known as: PERCOCET Take 1 tablet by mouth every 8 (eight) hours as needed (pain.).   QUEtiapine 200 MG tablet Commonly known as: SEROQUEL Take 200 mg by mouth at bedtime.   sertraline 100 MG tablet Commonly known as: ZOLOFT Take 100 mg by mouth daily.   SUMAtriptan 100 MG  tablet Commonly known as: Imitrex 100 mg orally at the onset of a migraine, may repeat in 2 hours if headache persists or recurs. Max 200mg  in 24 hrs What changed:   how much to take  how to take this  when to take this  reasons to take this   topiramate 50 MG tablet Commonly known as: Topamax Take 2 tablets (100 mg total) by mouth 2 (two) times daily. What changed: how much to take   traZODone 100 MG tablet Commonly known as: DESYREL Take 1 tablet (100 mg total) by mouth at bedtime.   Vitamin D (Ergocalciferol) 1.25 MG (50000 UNIT) Caps capsule Commonly known as: DRISDOL Take 50,000 Units by mouth every Monday.            Discharge Care Instructions  (From admission, onward)         Start     Ordered   10/26/19 0000  Discharge wound care:       Comments: You may apply a light dressing for minor discharge from the incision or to keep waistbands of clothing from rubbing.  You may also have been discharge with a clear dressing in which case this will be removed at your postoperative clinic visit.  You may shower, use soap on your incision.  Avoid baths or soaking the incision in the first 6 weeks following your surgery.Marland Kitchen   10/26/19 0744          Follow-up Information    Malachy Mood, MD.  Schedule an appointment as soon as possible for a visit on 11/02/2019.   Specialty: Obstetrics and Gynecology Why: staple removal and wound check Contact information: [address] [place] [address] [phone]               Signed: Malachy Mood 10/26/2019, 7:57 AM

## 2019-10-26 NOTE — Progress Notes (Signed)
PT Cancellation Note  Patient Details Name: Kelly Byrd MRN: [id number] DOB: Jan 09, 1976   Cancelled Treatment:    Reason Eval/Treat Not Completed:  (Consult received and chart reviewed.  Patient already discharged from unit prior to PT evaluation.  Per OT evaluation previous date, recommending RW, BSC and follow up HHPT/OT services at discharge.  TOC informed/aware.)   Divine Hansley H. Owens Shark, PT, DPT, NCS 10/26/19, 11:10 AM [phone]

## 2019-10-29 ENCOUNTER — Telehealth: Payer: Self-pay

## 2019-10-29 NOTE — Telephone Encounter (Signed)
Transition Care Management Follow-up Telephone Call Date of discharge and from where: 10/26/2019, [facility]  Call placed to patient # [phone]  the message stated that the call cannot be completed at this time

## 2019-10-30 ENCOUNTER — Telehealth: Payer: Self-pay

## 2019-10-30 NOTE — Telephone Encounter (Signed)
Transition Care Management Follow-up Telephone Call Date of discharge and from where: 10/26/2019, [facility]  Any questions or concerns? None  Items Reviewed: Did the pt receive and understand the discharge instructions provided? YES Medications obtained and verified? YES  Any new allergies since your discharge? NONE Dietary orders reviewed? Yes  Do you have support at home? Husband and daughter    Functional Questionnaire: (I = Independent and D = Dependent) ADLs: I   Follow up appointments reviewed:  PCP Hospital f/u appt confirmed?  Scheduled to see Dr Margarita Rana on 11/08/2019 at 10:50 am  [facility] f/u appt confirmed? Yes Margret Chance MD on 11/02/2019  Are transportation arrangements needed? NO  If their condition worsens, /is the pt aware to call PCP or go to the Emergency Dept.?  Pt is aware if condition is worsening or start experiencing any of diff breathing, SOB, chest pain, extreme fatigue, Persistent nausea and vomiting, bleeding , rapid weight gain, severe uncontrolled pain,fevers, any signs of infections at the incision site  or visual disturbances to return to ED  Was the patient provided with contact information for the PCP's office or ED? YES given.  Was to pt encouraged to call back with questions or concerns?YES name and contact information .  DME pt was discharged with walker and bedside commode. Pt has previously  7 days a week aid home services.

## 2019-11-02 ENCOUNTER — Ambulatory Visit (INDEPENDENT_AMBULATORY_CARE_PROVIDER_SITE_OTHER): Payer: Medicaid Other | Admitting: Obstetrics and Gynecology

## 2019-11-02 ENCOUNTER — Encounter: Payer: Self-pay | Admitting: Obstetrics and Gynecology

## 2019-11-02 ENCOUNTER — Other Ambulatory Visit: Payer: Self-pay

## 2019-11-02 VITALS — BP 140/88 | Wt 238.0 lb

## 2019-11-02 DIAGNOSIS — Z4889 Encounter for other specified surgical aftercare: Secondary | ICD-10-CM

## 2019-11-02 DIAGNOSIS — L03311 Cellulitis of abdominal wall: Secondary | ICD-10-CM

## 2019-11-02 MED ORDER — SULFAMETHOXAZOLE-TRIMETHOPRIM 800-160 MG PO TABS: 1.0000 | ORAL_TABLET | Freq: Two times a day (BID) | ORAL | 0 refills | Status: AC

## 2019-11-02 NOTE — Progress Notes (Signed)
Postoperative Follow-up Patient presents post op from abdominal supracervical hysterectomy 1weeks ago for uterine fibroid.  Subjective: Patient reports some improvement in her preop symptoms. Eating a regular diet without difficulty. Pain is controlled without any medications.  Activity: still limited secondary to soreness.  Objective: Blood pressure 140/88, weight 238 lb (108 kg), last menstrual period 10/04/2019.  General: NAD Pulmonary: no increased work of breathing Abdomen: soft, non-tender, non-distended, incision D/C/I  Several staples have fallen out already.  Remaining staples removed today.  There is a small area with right edge overriding left edge superiorly with some mild surrounding erythema Extremities: no edema Neurologic: normal gait    Admission on 10/23/2019, Discharged on 10/26/2019  Component Date Value Ref Range Status  . Tricyclic, Ur Screen 91/47/8295 NONE DETECTED  NONE DETECTED Final  . Amphetamines, Ur Screen 10/23/2019 NONE DETECTED  NONE DETECTED Final  . MDMA (Ecstasy)Ur Screen 10/23/2019 NONE DETECTED  NONE DETECTED Final  . Cocaine Metabolite,Ur [place] 10/23/2019 NONE DETECTED  NONE DETECTED Final  . Opiate, Ur Screen 10/23/2019 NONE DETECTED  NONE DETECTED Final  . Phencyclidine (PCP) Ur S 10/23/2019 NONE DETECTED  NONE DETECTED Final  . Cannabinoid 50 Ng, Ur [place] 10/23/2019 NONE DETECTED  NONE DETECTED Final  . Barbiturates, Ur Screen 10/23/2019 NONE DETECTED  NONE DETECTED Final  . Benzodiazepine, Ur Scrn 10/23/2019 NONE DETECTED  NONE DETECTED Final  . Methadone Scn, Ur 10/23/2019 NONE DETECTED  NONE DETECTED Final   Comment: (NOTE) Tricyclics + metabolites, urine    Cutoff 1000 ng/mL Amphetamines + metabolites, urine  Cutoff 1000 ng/mL MDMA (Ecstasy), urine              Cutoff 500 ng/mL Cocaine Metabolite, urine          Cutoff 300 ng/mL Opiate + metabolites, urine        Cutoff 300 ng/mL Phencyclidine (PCP), urine         Cutoff 25  ng/mL Cannabinoid, urine                 Cutoff 50 ng/mL Barbiturates + metabolites, urine  Cutoff 200 ng/mL Benzodiazepine, urine              Cutoff 200 ng/mL Methadone, urine                   Cutoff 300 ng/mL  The urine drug screen provides only a preliminary, unconfirmed analytical test result and should not be used for non-medical purposes. Clinical consideration and professional judgment should be applied to any positive drug screen result due to possible interfering substances. A more specific alternate chemical method must be used in order to obtain a confirmed analytical result. Gas chromatography / mass spectrometry (GC/MS) is the preferred confirm                          atory method. Performed at [facility], [address], [place], [place] [address]   . ABO/RH(D) 10/23/2019    Final                   Value:B POS Performed at [facility], [address] [place], [place] [address]   . Preg Test, Ur 10/23/2019 NEGATIVE  NEGATIVE Final   Comment:        THE SENSITIVITY OF THIS METHODOLOGY IS >24 mIU/mL   . Order Confirmation 10/23/2019    Final  Value:ORDER PROCESSED BY BLOOD BANK Performed at [facility], [address] [place], [place] [address]   . SURGICAL PATHOLOGY 10/23/2019    Final-Edited                   Value:SURGICAL PATHOLOGY CASE: [id number] PATIENT: Kelly Byrd Surgical Pathology Report     Specimen Submitted: A. Uterus  Clinical History: AUB N93.9, intramural fibroid D25.1    DIAGNOSIS: A. UTERUS; SUPRACERVICAL HYSTERECTOMY: - ENDOMETRIUM:      - SECRETORY PHASE ENDOMETRIUM.      - NEGATIVE FOR ATYPICAL HYPERPLASIA/EIN AND MALIGNANCY. - MYOMETRIUM:      - ADENOMYOSIS.      - LEIOMYOMATA UTERI.      - NEGATIVE FOR FEATURES OF MALIGNANCY.  GROSS DESCRIPTION: A. Labeled: Uterus Received: Formalin Weight: 560.27 grams Dimensions:      Fundus -10.7 (superior to inferior) x  10.5 (cornu to cornu) x 10.4 (anterior to posterior) cm      Cervix -none grossly identified Serosa: The serosa is tan-pink, smooth, and glistening.  A distinct orientation between anterior and posterior cannot be determined. Endometrial cavity:      Dimensions -6.8 (superior to inferior) x 5.2 (cornu to cornu) cm      Thickness -0.1 cm      Other findings -the endometrium is t                         an-red and finely granular. Myometrium:     Thickness -up to 4.8 cm     Other findings -the myometrium is tan-pink with 8 intramural nodules ranging from 0.3 to 7.6 cm in greatest dimension.  The nodules have a tan-white, whorled, firm, and well-circumscribed cut surface.  The largest nodule has a focal area of calcifications.  One of the smaller nodules has areas of tan-brown discoloration. Other comments: None grossly identified  Block summary: 1 - representative lower uterine segment, side A 2 - representative lower uterine segment, side B 3 - representative transmural endomyometrium, side A 4 - representative transmural endomyometrium, side B 5 - 6 - representative intramural nodules 7 - representative nodule with discolorations (1/cm) 8 - 10 - representative nodule with calcifications (1/cm)      8 - following decalcification    Final Diagnosis performed by Allena Napoleon, MD.   Electronically signed 10/25/2019 9:51:38AM The electronic signature indicates that                          the named Attending Pathologist has evaluated the specimen Technical component performed at [facility], [address], [place], [place] [address] Lab: [phone] Dir: Rush Farmer, MD, MMM  Professional component performed at [facility], [facility], [place], [place], [place] [address] Lab: [phone] Dir: Dellia Nims. Rubinas, MD   . WBC 10/24/2019 17.8* 4.0 - 10.5 K/uL Final  . RBC 10/24/2019 4.37  3.87 - 5.11 MIL/uL Final  . Hemoglobin 10/24/2019 8.5* 12.0 - 15.0 g/dL  Final   Comment: Reticulocyte Hemoglobin testing may be clinically indicated, consider ordering this additional test [id number]   . HCT 10/24/2019 28.0* 36 - 46 % Final  . MCV 10/24/2019 64.1* 80.0 - 100.0 fL Final  . MCH 10/24/2019 19.5* 26.0 - 34.0 pg Final  . MCHC 10/24/2019 30.4  30.0 - 36.0 g/dL Final  . RDW 10/24/2019 20.6* 11.5 - 15.5 % Final  . Platelets 10/24/2019 505* 150 - 400 K/uL Final  .  nRBC 10/24/2019 0.0  0.0 - 0.2 % Final   Performed at [facility], [address] [place], [place] [address]  . Sodium 10/24/2019 139  135 - 145 mmol/L Final  . Potassium 10/24/2019 3.9  3.5 - 5.1 mmol/L Final  . Chloride 10/24/2019 109  98 - 111 mmol/L Final  . CO2 10/24/2019 22  22 - 32 mmol/L Final  . Glucose, Bld 10/24/2019 180* 70 - 99 mg/dL Final   Glucose reference range applies only to samples taken after fasting for at least 8 hours.  . BUN 10/24/2019 5* 6 - 20 mg/dL Final  . Creatinine, Ser 10/24/2019 0.69  0.44 - 1.00 mg/dL Final  . Calcium 10/24/2019 8.9  8.9 - 10.3 mg/dL Final  . GFR calc non Af Amer 10/24/2019 >60  >60 mL/min Final  . GFR calc Af Amer 10/24/2019 >60  >60 mL/min Final  . Anion gap 10/24/2019 8  5 - 15 Final   Performed at [facility], [address] [place], [place] [address]  . WBC 10/25/2019 10.7* 4.0 - 10.5 K/uL Final  . RBC 10/25/2019 4.07  3.87 - 5.11 MIL/uL Final  . Hemoglobin 10/25/2019 7.8* 12.0 - 15.0 g/dL Final   Comment: Reticulocyte Hemoglobin testing may be clinically indicated, consider ordering this additional test [id number]   . HCT 10/25/2019 27.3* 36 - 46 % Final  . MCV 10/25/2019 67.1* 80.0 - 100.0 fL Final  . MCH 10/25/2019 19.2* 26.0 - 34.0 pg Final  . MCHC 10/25/2019 28.6* 30.0 - 36.0 g/dL Final  . RDW 10/25/2019 20.2* 11.5 - 15.5 % Final  . Platelets 10/25/2019 460* 150 - 400 K/uL Final  . nRBC 10/25/2019 0.0  0.0 - 0.2 % Final   Performed at [facility], [address], [place], [place]  [address]  . Sodium 10/25/2019 141  135 - 145 mmol/L Final  . Potassium 10/25/2019 3.8  3.5 - 5.1 mmol/L Final  . Chloride 10/25/2019 109  98 - 111 mmol/L Final  . CO2 10/25/2019 24  22 - 32 mmol/L Final  . Glucose, Bld 10/25/2019 100* 70 - 99 mg/dL Final   Glucose reference range applies only to samples taken after fasting for at least 8 hours.  . BUN 10/25/2019 6  6 - 20 mg/dL Final  . Creatinine, Ser 10/25/2019 0.56  0.44 - 1.00 mg/dL Final  . Calcium 10/25/2019 8.4* 8.9 - 10.3 mg/dL Final  . GFR calc non Af Amer 10/25/2019 >60  >60 mL/min Final  . GFR calc Af Amer 10/25/2019 >60  >60 mL/min Final  . Anion gap 10/25/2019 8  5 - 15 Final   Performed at [facility], [address], [place], [place] [address]  . WBC 10/26/2019 14.0* 4.0 - 10.5 K/uL Final  . RBC 10/26/2019 4.09  3.87 - 5.11 MIL/uL Final  . Hemoglobin 10/26/2019 7.9* 12.0 - 15.0 g/dL Final   Comment: Reticulocyte Hemoglobin testing may be clinically indicated, consider ordering this additional test [id number]   . HCT 10/26/2019 27.8* 36 - 46 % Final  . MCV 10/26/2019 68.0* 80.0 - 100.0 fL Final  . MCH 10/26/2019 19.3* 26.0 - 34.0 pg Final  . MCHC 10/26/2019 28.4* 30.0 - 36.0 g/dL Final  . RDW 10/26/2019 20.6* 11.5 - 15.5 % Final  . Platelets 10/26/2019 467* 150 - 400 K/uL Final  . nRBC 10/26/2019 0.0  0.0 - 0.2 % Final   Performed at [facility], [address], [place], [place] [address]  .  Sodium 10/26/2019 141  135 - 145 mmol/L Final  . Potassium 10/26/2019 3.7  3.5 - 5.1 mmol/L Final  . Chloride 10/26/2019 108  98 - 111 mmol/L Final  . CO2 10/26/2019 26  22 - 32 mmol/L Final  . Glucose, Bld 10/26/2019 110* 70 - 99 mg/dL Final   Glucose reference range applies only to samples taken after fasting for at least 8 hours.  . BUN 10/26/2019 10  6 - 20 mg/dL Final  . Creatinine, Ser 10/26/2019 0.84  0.44 - 1.00 mg/dL Final  . Calcium 10/26/2019 8.7* 8.9 - 10.3 mg/dL Final  . GFR calc non Af Amer  10/26/2019 >60  >60 mL/min Final  . GFR calc Af Amer 10/26/2019 >60  >60 mL/min Final  . Anion gap 10/26/2019 7  5 - 15 Final   Performed at [facility], [address] [place], [place] [address]    Assessment: 44 y.o. s/p abdominal supracervical hysterectomy stable  Plan: Patient has done well after surgery with no apparent complications.  I have discussed the post-operative course to date, and the expected progress moving forward.  The patient understands what complications to be concerned about.  I will see the patient in routine follow up, or sooner if needed.    Activity plan: No heavy lifting.  Staples removed early cellulitis rx bactrim   Good on pain medications  Return in about 1 week (around 11/09/2019) for wound check.    Malachy Mood, MD, [facility], [facility] 11/02/2019, 11:07 AM

## 2019-11-07 ENCOUNTER — Ambulatory Visit: Payer: Medicaid Other | Admitting: Obstetrics and Gynecology

## 2019-11-08 ENCOUNTER — Ambulatory Visit: Payer: Medicaid Other | Attending: Family Medicine | Admitting: Family Medicine

## 2019-11-08 ENCOUNTER — Other Ambulatory Visit: Payer: Self-pay

## 2019-11-14 NOTE — H&P (Signed)
Obstetrics & Gynecology Surgery H&P    Chief Complaint: Scheduled Surgery   History of Present Illness: Patient is a 44 y.o. [id number] presenting for scheduled TLH, BS, cystoscopy, for the treatment or further evaluation of AUB-L.   Prior Treatments prior to proceeding with surgery include: ultrasound, OCP  Preoperative Pap: 10/09/2019 Results: NILM HPV negative  Preoperative Endometrial biopsy: 10/09/2019 Findings: pathology benign proliferative Preoperative Ultrasound: 10/09/2019 Findings: 84.8 x 60.7 x 71.6 mm intramural anterior fibroid multiple smaller fibroids   Review of Systems:10 point review of systems  Past Medical History:      Patient Active Problem List   Diagnosis Date Noted  . Abnormal uterine bleeding (AUB) 10/11/2019  . Intramural uterine fibroid 10/11/2019  . Angioedema 06/26/2019  . Facial swelling   . Allergic reaction 06/25/2019  . Hypokalemia 06/25/2019  . Abdominal pain 06/25/2019  . 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 Cocaine abuse ([place]) 10/11/2017  . Lumbar radiculopathy 05/31/2017  . Hyperlipidemia 05/31/2017  . GERD (gastroesophageal reflux disease) 08/05/2016  . Fibroids 09/19/2015  . Hypertension 08/29/2015  . Asthma 08/29/2015  . Back pain 08/29/2015  . 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 Anemia, iron deficiency 10/29/2014  . Menometrorrhagia 10/29/2014  . Migraine 10/27/2014    Past Surgical History:       Past Surgical History:  Procedure Laterality Date  . CESAREAN SECTION     2    Family History:       Family History  Problem Relation Age of Onset  . Hypertension Mother   . Hypertension Father   . Diabetes Mellitus II Maternal Grandmother   . Lupus Other     Social History:  Social History        Socioeconomic History  . Marital status: Married    Spouse name: Not on file  . Number of children: Not on file  . Years of  education: Not on file  . Highest education level: Not on file  Occupational History  . Occupation: disabled  Tobacco Use  . Smoking status: Current Every Day Smoker    Packs/day: 0.25    Types: Cigarettes  . Smokeless tobacco: Never Used  Substance and Sexual Activity  . Alcohol use: Yes    Alcohol/week: 1.0 standard drink    Types: 1 Cans of beer per week  . Drug use: No  . Sexual activity: Yes    Birth control/protection: Surgical  Other Topics Concern  . Not on file  Social History Narrative  . Not on file   Social Determinants of Health      Financial Resource Strain:   . Difficulty of Paying Living Expenses:   Food Insecurity:   . Worried About Charity fundraiser in the Last Year:   . Arboriculturist in the Last Year:   Transportation Needs:   . Film/video editor (Medical):   Mar[place] Kitchen Lack of Transportation (Non-Medical):   Physical Activity:   . Days of Exercise per Week:   . Minutes of Exercise per Session:   Stress:   . Feeling of Stress :   Social Connections:   . Frequency of Communication with Friends and Family:   . Frequency of Social Gatherings with Friends and Family:   . Attends Religious Services:   . Active Member of Clubs or Organizations:   . Attends Archivist Meetings:   Mar[place] Kitchen Marital Status:   Intimate Partner Violence:   . Fear of Current  or Ex-Partner:   . Emotionally Abused:   Mar[place] Kitchen Physically Abused:   . Sexually Abused:     Allergies:       Allergies  Allergen Reactions  . Ace Inhibitors Swelling  . Lisinopril     Swelling--caused the pt to stay in the hospital  . Peanuts [Peanut Oil]     Swelling--caused the pt to stay in the hospital    Medications:        Prior to Admission medications   Medication Sig Start Date End Date Taking? Authorizing Provider  albuterol (VENTOLIN HFA) 108 (90 Base) MCG/ACT inhaler Inhale 2 puffs into the lungs every 6 (six) hours as needed for wheezing or shortness of  breath (wheezing). 03/19/19  Yes Charlott Rakes, MD  amLODipine (NORVASC) 10 MG tablet Take 1 tablet (10 mg total) by mouth daily. 03/19/19  Yes Charlott Rakes, MD  atorvastatin (LIPITOR) 20 MG tablet Take 1 tablet (20 mg total) by mouth daily. 03/19/19  Yes Charlott Rakes, MD  baclofen (LIORESAL) 10 MG tablet Take 10 mg by mouth in the morning and at bedtime.  07/16/19  Yes [provider]  celecoxib (CELEBREX) 100 MG capsule Take 100 mg by mouth daily. 07/16/19  Yes [provider]  diclofenac Sodium (VOLTAREN) 1 % GEL Apply 1 application topically 4 (four) times daily as needed (pain.).  07/16/19  Yes [provider]  diphenhydrAMINE (BENADRYL) 50 MG tablet Take 1 tablet (50 mg total) by mouth every 8 (eight) hours as needed for allergies. Patient taking differently: Take 50 mg by mouth every evening.  06/27/19  Yes Patrecia Pour, MD  EPINEPHrine 0.3 mg/0.3 mL IJ SOAJ injection Inject 0.3 mLs (0.3 mg total) into the muscle as needed for anaphylaxis. 07/23/19  Yes Fritzi Mandes, MD  famotidine (PEPCID) 20 MG tablet Take 1 tablet (20 mg total) by mouth 2 (two) times daily. 06/27/19 10/11/20 Yes Patrecia Pour, MD  gabapentin (NEURONTIN) 300 MG capsule Take 2 capsules (600 mg total) by mouth 2 (two) times daily. 03/19/19  Yes Charlott Rakes, MD  metroNIDAZOLE (FLAGYL) 500 MG tablet Take 4 tablets (2,000 mg total) by mouth once for 1 dose. 10/17/19 10/17/19 Yes Malachy Mood, MD  montelukast (SINGULAIR) 10 MG tablet Take 1 tablet (10 mg total) by mouth at bedtime. 03/19/19  Yes Charlott Rakes, MD  ondansetron (ZOFRAN ODT) 4 MG disintegrating tablet Take 1 tablet (4 mg total) by mouth every 8 (eight) hours as needed for nausea or vomiting. 12/18/18  Yes Harvest Dark, MD  oxyCODONE-acetaminophen (PERCOCET) 7.5-325 MG tablet Take 1 tablet by mouth every 8 (eight) hours as needed (pain.).  07/16/19  Yes [provider]  predniSONE (DELTASONE) 10 MG tablet  Take 5 tablets (50 mg total) by mouth daily with breakfast. Take 50 mg daily. Taper by 10 mg daily then stop. 07/24/19  Yes Fritzi Mandes, MD  QUEtiapine (SEROQUEL) 200 MG tablet Take 200 mg by mouth at bedtime. 10/08/19  Yes [provider]  sertraline (ZOLOFT) 100 MG tablet Take 100 mg by mouth daily. 10/08/19  Yes [provider]  SUMAtriptan (IMITREX) 100 MG tablet 100 mg orally at the onset of a migraine, may repeat in 2 hours if headache persists or recurs. Max 200mg  in 24 hrs Patient taking differently: Take 100 mg by mouth every 2 (two) hours as needed for migraine. 100 mg orally at the onset of a migraine, may repeat in 2 hours if headache persists or recurs. Max 200mg  in 24 hrs  03/19/19  Yes Charlott Rakes, MD  topiramate (TOPAMAX) 50 MG tablet Take 2 tablets (100 mg total) by mouth 2 (two) times daily. Patient taking differently: Take 50 mg by mouth 2 (two) times daily.  03/19/19  Yes Charlott Rakes, MD  traZODone (DESYREL) 100 MG tablet Take 1 tablet (100 mg total) by mouth at bedtime. 10/14/17  Yes Starkes-Perry, Gayland Curry, FNP  Vitamin D, Ergocalciferol, (DRISDOL) 1.25 MG (50000 UNIT) CAPS capsule Take 50,000 Units by mouth every Monday.  07/16/19  Yes [provider]    Physical Exam Vitals: Blood pressure (!) 158/98, height 5\' 4"  (1.626 m), weight 241 lb (109.3 kg), last menstrual period 10/04/2019. General: NAD HEENT: normocephalic, anicteric Pulmonary: No increased work of breathing, CTAB Cardiovascular: RRR, distal pulses 2+ Abdomen: soft, non-tender, non-distended Genitourinary: deferred Extremities: no edema, erythema, or tenderness Neurologic: Grossly intact Psychiatric: mood appropriate, affect full  Imaging  Imaging Results  US PELVIC COMPLETE WITH TRANSVAGINAL  Result Date: 10/09/2019 Patient Name: Nayleah Gamel DOB: 1975-03-30 MRN: [id number] ULTRASOUND REPORT Location: [facility] Date of Service: 10/09/2019  Indications:Abnormal Uterine Bleeding  And pelvic pain Findings: The uterus is anteverted and measures 14.0 x 9.7 x 10.2 cm. Echo texture is heterogenous with evidence of focal masses. Within the uterus are multiple suspected fibroids measuring: Fibroid 1: 84.8 x 60.7 x 71.6 mm intramural anterior The Endometrium is not visualized. Right Ovary is not visualized. Left Ovary measures 2.8 x 1.6 x 1.9 cm. It is normal in appearance. Survey of the adnexa demonstrates no adnexal masses. There is no free fluid in the cul de sac. Impression: 1. There is one intramural fibroid seen. 2. The endometrium is not visible. 3. The left ovary appears normal, the right ovary is not visible. Recommendations: 1.Clinical correlation with the patient's History and Physical Exam. Gweneth Dimitri, RT Images reviewed.  One dominant intramural fibroid encroaching on the endometrial cavity.  Shadowing of the fibroid precludes clear evaluation of the endometrium. Malachy Mood, MD, [facility], [facility] 10/09/2019, 11:22 AM     Assessment: 44 y.o. [id number] presenting for scheduled TLH, BS, cystoscopy  Plan: 1) Patient opts for definitive surgical management via hysterectomy. The risks of surgery were discussed in detail with the patient including but not limited to: bleeding which may require transfusion or reoperation; infection which may require antibiotics; injury to bowel, bladder, ureters or other surrounding organs (With a literature reported rate of urinary tract injury of 1% quoted); need for additional procedures including laparotomy; thromboembolic phenomenon, incisional problems and other postoperative/anesthesia complications.  Patient was also advised that recovery procedure generally involves an overnight stay; and the  expected recovery time after a hysterectomy being in the range of 6-8 weeks.  Likelihood of success in alleviating the patient's symptoms was discussed.  While definitive in  regards to issues with menstural bleeding, pelvic pain if present preoperatively may continue and in fact worsen postoperatively.  She is aware that the procedure will render her unable to pursue childbearing in the future.   She was told that she will be contacted by our surgical scheduler regarding the time and date of her surgery; routine preoperative instructions of having nothing to eat or drink after midnight on the day prior to surgery and also coming to the hospital 1.5 hours prior to her time of surgery were also emphasized.  She was told she may be called for a preoperative appointment about a week prior to surgery and will be given further preoperative  instructions at that visit.  Routine postoperative instructions will be reviewed with the patient and her family in detail after surgery. Printed patient education handouts about the procedure was given to the patient to review at home.   2) Routine postoperative instructions were reviewed with the patient and her family in detail today including the expected length of recovery and likely postoperative course.  The patient concurred with the proposed plan, giving informed written consent for the surgery today.  Patient instructed on the importance of being NPO after midnight prior to her procedure.  If warranted preoperative prophylactic antibiotics and SCDs ordered on call to the OR to meet SCIP guidelines and adhere to recommendation laid forth in [place] Number 104 May 2009  "Antibiotic Prophylaxis for Gynecologic Procedures".     Malachy Mood, MD, [facility], [facility] 10/17/2019, 9:03 AM  Date of Initial H&P: 10/17/2019 above  History reviewed, patient examined, no change in status, stable for surgery.

## 2019-11-16 ENCOUNTER — Ambulatory Visit: Payer: Medicaid Other | Admitting: Obstetrics and Gynecology

## 2020-01-03 ENCOUNTER — Other Ambulatory Visit: Payer: Self-pay | Admitting: Family Medicine

## 2020-01-03 DIAGNOSIS — J4520 Mild intermittent asthma, uncomplicated: Secondary | ICD-10-CM

## 2020-01-03 DIAGNOSIS — M5416 Radiculopathy, lumbar region: Secondary | ICD-10-CM

## 2020-01-03 DIAGNOSIS — I10 Essential (primary) hypertension: Secondary | ICD-10-CM

## 2020-01-03 MED ORDER — AMLODIPINE BESYLATE 10 MG PO TABS: 10.0000 mg | ORAL_TABLET | Freq: Every day | ORAL | 1 refills | Status: DC

## 2020-01-03 MED ORDER — GABAPENTIN 300 MG PO CAPS: 600.0000 mg | ORAL_CAPSULE | Freq: Two times a day (BID) | ORAL | 0 refills | Status: DC

## 2020-01-03 MED ORDER — ALBUTEROL SULFATE HFA 108 (90 BASE) MCG/ACT IN AERS: 2.0000 | INHALATION_SPRAY | Freq: Four times a day (QID) | RESPIRATORY_TRACT | 1 refills | Status: DC | PRN

## 2020-01-03 NOTE — Telephone Encounter (Signed)
Requested medication (s) are due for refill today: yes  Requested medication (s) are on the active medication list:yes   Future visit scheduled: no  Notes to clinic:  refill cannot be delegated Pepcid was not filled by your office    Requested Prescriptions  Pending Prescriptions Disp Refills   Vitamin D, Ergocalciferol, (DRISDOL) 1.25 MG (50000 UNIT) CAPS capsule 5 capsule     Sig: Take 1 capsule (50,000 Units total) by mouth every Monday.      Endocrinology:  Vitamins - Vitamin D Supplementation Failed - 01/03/2020  4:02 PM      Failed - 50,000 IU strengths are not delegated      Failed - Ca in normal range and within 360 days    Calcium  Date Value Ref Range Status  10/26/2019 8.7 (L) 8.9 - 10.3 mg/dL Final          Failed - Phosphate in normal range and within 360 days    Phosphorus  Date Value Ref Range Status  06/26/2019 2.4 (L) 2.5 - 4.6 mg/dL Final    Comment:    Performed at [facility], [address] [place], [place] [address]          Failed - Vitamin D in normal range and within 360 days    No results found for: [id number], [id number], [id number], [place], [place], 25OHVITD3, 25OHVITD2, 25OHVITD1, 25OHVITD2, 25OHVITD3, VD25OH        Passed - Valid encounter within last 12 months    Recent Outpatient Visits           9 months ago Refractive errors   [place], Enobong, MD   1 year ago Lumbar radiculopathy   [place], Enobong, MD   1 year ago 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      Pulmonology:  Beta Agonists Failed - 01/03/2020  4:02 PM      Failed - One inhaler should last at least one month. If the patient is requesting refills earlier, contact the patient to check for uncontrolled symptoms.      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

## 2020-01-03 NOTE — Telephone Encounter (Signed)
Medication: amLODipine (NORVASC) 10 MG tablet [[id number], gabapentin (NEURONTIN) 300 MG capsule [[id number]], albuterol (VENTOLIN HFA) 108 (90 Base) MCG/ACT inhaler [[id number]] , famotidine (PEPCID) 20 MG tablet [[id number]] , Vitamin D, Ergocalciferol, (DRISDOL) 1.25 MG (50000 UNIT) CAPS capsule [[id number]] ,   Has the patient contacted their pharmacy? YES (Agent: If no, request that the patient contact the pharmacy for the refill.) (Agent: If yes, when and what did the pharmacy advise?)  Preferred Pharmacy (with phone number or street name): [facility]   Agent: Please be advised that RX refills may take up to 3 business days. We ask that you follow-up with your pharmacy.

## 2020-01-14 ENCOUNTER — Other Ambulatory Visit: Payer: Medicaid Other

## 2020-02-27 ENCOUNTER — Other Ambulatory Visit: Payer: Self-pay | Admitting: Family Medicine

## 2020-02-27 ENCOUNTER — Ambulatory Visit: Payer: Self-pay

## 2020-02-27 DIAGNOSIS — M5416 Radiculopathy, lumbar region: Secondary | ICD-10-CM

## 2020-02-27 MED ORDER — GABAPENTIN 300 MG PO CAPS: 600.0000 mg | ORAL_CAPSULE | Freq: Two times a day (BID) | ORAL | 0 refills | Status: DC

## 2020-02-27 NOTE — Telephone Encounter (Signed)
Medication Refill - Medication: gabapentin (NEURONTIN) 300 MG capsule    Preferred Pharmacy (with phone number or street name):  [facility] [place] [facility] [place] Phone:  [phone]  Fax:  [phone]       Agent: Please be advised that RX refills may take up to 3 business days. We ask that you follow-up with your pharmacy.

## 2020-02-27 NOTE — Telephone Encounter (Signed)
Just a FYI

## 2020-02-27 NOTE — Telephone Encounter (Signed)
Patient called stating that she has had chest pains left side and weakness and numbness to her rt side arm hand leg.  She has also had right side headache.  She states that the weakness and numbness started last weak on Tuesday.  She states it causes her to fall a lot.  She states that the chest pains came before any of the numbness. She also experiencing blurred vision. Friday and Saturday she noticed her feet were swollen.  Per protocol patient will go to ER for evaluation.  Care advice read to patient.  She verbalized understanding.   Reason for Disposition . Headache  (and neurologic deficit)  Answer Assessment - Initial Assessment Questions 1. SYMPTOM: "What is the main symptom you are concerned about?" (e.g., weakness, numbness)     Numbness on right side arm leg rt side head pain 2. ONSET: "When did this start?" (minutes, hours, days; while sleeping)     Last week 3. LAST NORMAL: "When was the last time you were normal (no symptoms)?"    Last tuesday 4. PATTERN "Does this come and go, or has it been constant since it started?"  "Is it present now?"     constant 5. CARDIAC SYMPTOMS: "Have you had any of the following symptoms: chest pain, difficulty breathing, palpitations?"     Chest pain befor numbness  6. NEUROLOGIC SYMPTOMS: "Have you had any of the following symptoms: headache, dizziness, vision loss, double vision, changes in speech, unsteady on your feet?"    Headache, vision blurry, fall weak on rt side 7. OTHER SYMPTOMS: "Do you have any other symptoms?"    Swelling rt foot Friday and saturday 8. PREGNANCY: "Is there any chance you are pregnant?" "When was your last menstrual period?"     No histerectomy  Protocols used: NEUROLOGIC DEFICIT-A-AH

## 2020-02-29 ENCOUNTER — Encounter: Payer: Self-pay | Admitting: Emergency Medicine

## 2020-02-29 ENCOUNTER — Other Ambulatory Visit: Payer: Self-pay

## 2020-02-29 ENCOUNTER — Emergency Department: Payer: Medicaid Other

## 2020-02-29 ENCOUNTER — Emergency Department
Admission: EM | Admit: 2020-02-29 | Discharge: 2020-02-29 | Disposition: A | Discharge status: Home / Self Care | Payer: Medicaid Other | Attending: Emergency Medicine | Admitting: Emergency Medicine

## 2020-02-29 DIAGNOSIS — G43909 Migraine, unspecified, not intractable, without status migrainosus: Secondary | ICD-10-CM | POA: Insufficient documentation

## 2020-02-29 DIAGNOSIS — Z79899 Other long term (current) drug therapy: Secondary | ICD-10-CM | POA: Insufficient documentation

## 2020-02-29 DIAGNOSIS — I129 Hypertensive chronic kidney disease with stage 1 through stage 4 chronic kidney disease, or unspecified chronic kidney disease: Secondary | ICD-10-CM | POA: Diagnosis not present

## 2020-02-29 DIAGNOSIS — R0789 Other chest pain: Secondary | ICD-10-CM | POA: Insufficient documentation

## 2020-02-29 DIAGNOSIS — R079 Chest pain, unspecified: Secondary | ICD-10-CM | POA: Diagnosis present

## 2020-02-29 DIAGNOSIS — Z91010 Allergy to peanuts: Secondary | ICD-10-CM | POA: Diagnosis not present

## 2020-02-29 DIAGNOSIS — F17210 Nicotine dependence, cigarettes, uncomplicated: Secondary | ICD-10-CM | POA: Diagnosis not present

## 2020-02-29 DIAGNOSIS — N189 Chronic kidney disease, unspecified: Secondary | ICD-10-CM | POA: Insufficient documentation

## 2020-02-29 DIAGNOSIS — J45909 Unspecified asthma, uncomplicated: Secondary | ICD-10-CM | POA: Diagnosis not present

## 2020-02-29 LAB — BASIC METABOLIC PANEL
Anion gap: 13 (ref 5–15)
BUN: 8 mg/dL (ref 6–20)
CO2: 24 mmol/L (ref 22–32)
Calcium: 9.3 mg/dL (ref 8.9–10.3)
Chloride: 100 mmol/L (ref 98–111)
Creatinine, Ser: 0.52 mg/dL (ref 0.44–1.00)
GFR, Estimated: >60 mL/min (ref >60)
Glucose, Bld: 139 mg/dL — ABNORMAL HIGH (ref 70–99)
Potassium: 2.8 mmol/L — ABNORMAL LOW (ref 3.5–5.1)
Sodium: 137 mmol/L (ref 135–145)

## 2020-02-29 LAB — CBC
HCT: 40.5 % (ref 36.0–46.0)
Hemoglobin: 12.5 g/dL (ref 12.0–15.0)
MCH: 21 pg — ABNORMAL LOW (ref 26.0–34.0)
MCHC: 30.9 g/dL (ref 30.0–36.0)
MCV: 68 fL — ABNORMAL LOW (ref 80.0–100.0)
Platelets: 493 10*3/uL — ABNORMAL HIGH (ref 150–400)
RBC: 5.96 MIL/uL — ABNORMAL HIGH (ref 3.87–5.11)
RDW: 22.5 % — ABNORMAL HIGH (ref 11.5–15.5)
WBC: 11.6 10*3/uL — ABNORMAL HIGH (ref 4.0–10.5)
nRBC: 0 % (ref 0.0–0.2)

## 2020-02-29 LAB — TROPONIN I (HIGH SENSITIVITY)
Troponin I (High Sensitivity): 8 ng/L (ref <18)
Troponin I (High Sensitivity): 7 ng/L (ref <18)

## 2020-02-29 LAB — APTT: aPTT: 31 seconds (ref 24–36)

## 2020-02-29 LAB — PROTIME-INR
INR: 1 (ref 0.8–1.2)
Prothrombin Time: 12.5 seconds (ref 11.4–15.2)

## 2020-02-29 MED ORDER — POTASSIUM CHLORIDE ER 10 MEQ PO TBCR: 10.0000 meq | EXTENDED_RELEASE_TABLET | Freq: Two times a day (BID) | ORAL | 0 refills | Status: DC

## 2020-02-29 MED ORDER — ONDANSETRON HCL 4 MG/2ML IJ SOLN
4.0000 mg | Freq: Once | INTRAMUSCULAR | Status: AC
  Administered 2020-02-29: 4 mg via INTRAVENOUS
  Filled 2020-02-29: qty 2

## 2020-02-29 MED ORDER — MORPHINE SULFATE (PF) 4 MG/ML IV SOLN
4.0000 mg | Freq: Once | INTRAVENOUS | Status: AC
  Administered 2020-02-29: 4 mg via INTRAVENOUS
  Filled 2020-02-29: qty 1

## 2020-02-29 MED ORDER — IOHEXOL 350 MG/ML SOLN: 75.0000 mL | Freq: Once | INTRAVENOUS | Status: DC | PRN

## 2020-02-29 MED ORDER — IOHEXOL 350 MG/ML SOLN
100.0000 mL | Freq: Once | INTRAVENOUS | Status: AC | PRN
  Administered 2020-02-29: 100 mL via INTRAVENOUS

## 2020-02-29 NOTE — ED Triage Notes (Signed)
Pt to ED via POV, pt ambulatory into triage room without difficulty or assistance. Pt reports chest pain since last week. Pt states that the pain is in the left side of her chest and radiation under her left breast and around her left side at times.  Pt also reports that she has numbness in her right arm that first started on Wednesday. Pt states that it has gotten worse since then. Pt reports that she is having trouble holding onto things, pt states that she has numbness in her leg as well and has had increased falls. Pt states that she is also having some tingling in her left arm and left as well. Pt reports having dizziness and headache as well.  Pt does not have any slurred speech or facial droop noted, no unilateral neglect noted at this time.

## 2020-02-29 NOTE — ED Provider Notes (Signed)
[facility] Emergency Department Provider Note   ____________________________________________    I have reviewed the triage vital signs and the nursing notes.   HISTORY  Chief Complaint Chest Pain and Numbness     HPI Kelly Byrd is a 44 y.o. female who presents with multiple complaints.  Patient reports that for the last 2 weeks she has had a migraine headache that occasionally gets better but then seems to come back.  2 to 3 days ago she developed chest pain with radiation to her left arm as well.  She reports she has not had this before.  She has not take anything for this.  She denies shortness of breath.  No pleurisy.  No fevers chills or cough.  Past Medical History:  Diagnosis Date  . Asthma   . Back pain   . Chronic kidney disease    frequent infrections  . Depression   . Dislocation of hip ([place])    right hip  . GERD (gastroesophageal reflux disease)   . Hypertension   . Migraines   . Pre-diabetes   . Sickle cell trait [facility])     Patient Active Problem List   Diagnosis Date Noted  . S/P abdominal supracervical subtotal hysterectomy 10/23/2019  . Abdominal adhesions   . Abnormal uterine bleeding (AUB) 10/11/2019  . Intramural leiomyoma of uterus 10/11/2019  . Angioedema 06/26/2019  . Facial swelling   . Allergic reaction 06/25/2019  . Hypokalemia 06/25/2019  . Abdominal pain 06/25/2019  . 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 Cocaine abuse ([place]) 10/11/2017  . Lumbar radiculopathy 05/31/2017  . Hyperlipidemia 05/31/2017  . GERD (gastroesophageal reflux disease) 08/05/2016  . Fibroids 09/19/2015  . Hypertension 08/29/2015  . Asthma 08/29/2015  . Back pain 08/29/2015  . 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 Anemia, iron deficiency 10/29/2014  . Menometrorrhagia 10/29/2014  . Migraine 10/27/2014    Past Surgical History:  Procedure Laterality Date    . CESAREAN SECTION     2  . CYSTOSCOPY N/A 10/23/2019   Procedure: CYSTOSCOPY;  Surgeon: Malachy Mood, MD;  Location: [facility] ORS;  Service: Gynecology;  Laterality: N/A;  . TOTAL LAPAROSCOPIC HYSTERECTOMY WITH SALPINGECTOMY Bilateral 10/23/2019   Procedure: TOTAL LAPAROSCOPIC HYSTERECTOMY CONVERTED TO OPEN SUPRACERVICAL HYSTERECTOMY;  Surgeon: Malachy Mood, MD;  Location: [facility] ORS;  Service: Gynecology;  Laterality: Bilateral;  . TUBAL LIGATION    . upper teeth      Prior to Admission medications   Medication Sig Start Date End Date Taking? Authorizing Provider  albuterol (VENTOLIN HFA) 108 (90 Base) MCG/ACT inhaler Inhale 2 puffs into the lungs every 6 (six) hours as needed for wheezing or shortness of breath (wheezing). 01/03/20   Charlott Rakes, MD  amLODipine (NORVASC) 10 MG tablet Take 1 tablet (10 mg total) by mouth daily. 01/03/20   Charlott Rakes, MD  atorvastatin (LIPITOR) 20 MG tablet Take 1 tablet (20 mg total) by mouth daily. 03/19/19   Charlott Rakes, MD  baclofen (LIORESAL) 10 MG tablet Take 10 mg by mouth in the morning and at bedtime.  07/16/19   [provider]  celecoxib (CELEBREX) 100 MG capsule Take 100 mg by mouth daily. 07/16/19   [provider]  diclofenac Sodium (VOLTAREN) 1 % GEL Apply 1 application topically 4 (four) times daily as needed (pain.).  07/16/19   [provider]  diphenhydrAMINE (BENADRYL) 50 MG tablet Take 1 tablet (50 mg total) by mouth every 8 (  eight) hours as needed for allergies. Patient taking differently: Take 50 mg by mouth every evening.  06/27/19   Patrecia Pour, MD  EPINEPHrine 0.3 mg/0.3 mL IJ SOAJ injection Inject 0.3 mLs (0.3 mg total) into the muscle as needed for anaphylaxis. 07/23/19   Fritzi Mandes, MD  famotidine (PEPCID) 20 MG tablet Take 1 tablet (20 mg total) by mouth 2 (two) times daily. 06/27/19 10/11/20  Patrecia Pour, MD  gabapentin (NEURONTIN) 300 MG capsule Take 2 capsules (600 mg total) by mouth 2  (two) times daily. 02/27/20   Charlott Rakes, MD  montelukast (SINGULAIR) 10 MG tablet Take 1 tablet (10 mg total) by mouth at bedtime. 03/19/19   Charlott Rakes, MD  ondansetron (ZOFRAN ODT) 4 MG disintegrating tablet Take 1 tablet (4 mg total) by mouth every 8 (eight) hours as needed for nausea or vomiting. 12/18/18   Harvest Dark, MD  oxyCODONE-acetaminophen (PERCOCET) 7.5-325 MG tablet Take 1 tablet by mouth every 8 (eight) hours as needed (pain.).  07/16/19   [provider]  potassium chloride (KLOR-CON) 10 MEQ tablet Take 1 tablet (10 mEq total) by mouth 2 (two) times daily. 02/29/20   Lavonia Drafts, MD  QUEtiapine (SEROQUEL) 200 MG tablet Take 200 mg by mouth at bedtime. 10/08/19   [provider]  sertraline (ZOLOFT) 100 MG tablet Take 100 mg by mouth daily. 10/08/19   [provider]  SUMAtriptan (IMITREX) 100 MG tablet 100 mg orally at the onset of a migraine, may repeat in 2 hours if headache persists or recurs. Max 200mg  in 24 hrs Patient taking differently: Take 100 mg by mouth every 2 (two) hours as needed for migraine. 100 mg orally at the onset of a migraine, may repeat in 2 hours if headache persists or recurs. Max 200mg  in 24 hrs 03/19/19   Charlott Rakes, MD  topiramate (TOPAMAX) 50 MG tablet Take 2 tablets (100 mg total) by mouth 2 (two) times daily. Patient taking differently: Take 50 mg by mouth 2 (two) times daily.  03/19/19   Charlott Rakes, MD  traZODone (DESYREL) 100 MG tablet Take 1 tablet (100 mg total) by mouth at bedtime. 10/14/17   Starkes-Perry, Gayland Curry, FNP  Vitamin D, Ergocalciferol, (DRISDOL) 1.25 MG (50000 UNIT) CAPS capsule Take 50,000 Units by mouth every Monday.  07/16/19   [provider]     Allergies Ace inhibitors, Lisinopril, and Peanuts [peanut oil]  Family History  Problem Relation Age of Onset  . Hypertension Mother   . Hypertension Father   . Diabetes Mellitus II Maternal Grandmother   . Lupus Other      Social History Social History   Tobacco Use  . Smoking status: Current Every Day Smoker    Packs/day: 0.25    Types: Cigarettes  . Smokeless tobacco: Never Used  Vaping Use  . Vaping Use: Never used  Substance Use Topics  . Alcohol use: Yes    Alcohol/week: 1.0 standard drink    Types: 1 Cans of beer per week  . Drug use: Yes    Types: Cocaine    Comment: + cocaine 09/2017   Denies use on 7/21 2021    Review of Systems  Constitutional: No fever/chills Eyes: No visual changes.  ENT: No sore throat. Cardiovascular: As above Respiratory: Denies shortness of breath. Gastrointestinal: No abdominal pain.  No nausea, no vomiting.   Genitourinary: Negative for dysuria. Musculoskeletal: Negative for back pain. Skin: Negative for rash. Neurological: As above, tingling in the right  arm and right leg but no weakness, now resolved   ____________________________________________   PHYSICAL EXAM:  VITAL SIGNS: ED Triage Vitals  Enc Vitals Group     BP 02/29/20 1000 (!) 142/82     Pulse Rate 02/29/20 1000 71     Resp 02/29/20 1000 19     Temp 02/29/20 1230 98.4 F (36.9 C)     Temp Source 02/29/20 1230 Oral     SpO2 02/29/20 1000 98 %     Weight 02/29/20 0812 107.5 kg (237 lb)     Height 02/29/20 0812 1.626 m (5\' 4" )     Head Circumference --      Peak Flow --      Pain Score 02/29/20 0812 9     Pain Loc --      Pain Edu? --      Excl. in [place]? --     Constitutional: Alert and oriented.  Eyes: Conjunctivae are normal.  PERRLA, Head: Atraumatic. Nose: No congestion/rhinnorhea. Mouth/Throat: Mucous membranes are moist.    Cardiovascular: Normal rate, regular rhythm. Grossly normal heart sounds.  Good peripheral circulation.  Equal pulses bilaterally Respiratory: Normal respiratory effort.  No retractions. Lungs CTAB. Gastrointestinal: Soft and nontender. No distention.  No CVA tenderness.  Musculoskeletal: No lower extremity tenderness nor edema.  Warm and well  perfused Neurologic:  Normal speech and language. No gross focal neurologic deficits are appreciated.  Skin:  Skin is warm, dry and intact. No rash noted. Psychiatric: Mood and affect are normal. Speech and behavior are normal.  ____________________________________________   LABS (all labs ordered are listed, but only abnormal results are displayed)  Labs Reviewed  BASIC METABOLIC PANEL - Abnormal; Notable for the following components:      Result Value   Potassium 2.8 (*)    Glucose, Bld 139 (*)    All other components within normal limits  CBC - Abnormal; Notable for the following components:   WBC 11.6 (*)    RBC 5.96 (*)    MCV 68.0 (*)    MCH 21.0 (*)    RDW 22.5 (*)    Platelets 493 (*)    All other components within normal limits  PROTIME-INR  APTT  POC URINE PREG, ED  CBG MONITORING, ED  TROPONIN I (HIGH SENSITIVITY)  TROPONIN I (HIGH SENSITIVITY)   ____________________________________________  EKG  ED ECG REPORT I, Lavonia Drafts, the attending physician, personally viewed and interpreted this ECG.  Date: 02/29/2020  Rhythm: normal sinus rhythm QRS Axis: normal Intervals: normal ST/T Wave abnormalities: normal Narrative Interpretation: no evidence of acute ischemia  ____________________________________________  RADIOLOGY  CT head unremarkable CT angiography reviewed by me, no evidence of dissection Chest x-ray reviewed by me, no infiltrate or effusion ____________________________________________   PROCEDURES  Procedure(s) performed: No  Procedures   Critical Care performed: No ____________________________________________   INITIAL IMPRESSION / ASSESSMENT AND PLAN / ED COURSE  Pertinent labs & imaging results that were available during my care of the patient were reviewed by me and considered in my medical decision making (see chart for details).  Patient presents with multiple complaints primarily chest discomfort with radiation to her  left arm, mostly resolved at this time however did have for nearly 2 days.  No history of heart disease, does have a history of substance abuse but denies recent use of cocaine.  Headache as noted above, also seems to be improving but reports tingling in her right arm and right leg also resolved at this  time.  EKG is reassuring, CT head is unremarkable.  Delta troponin unchanged.  CT angiography of the chest negative for dissection no evidence of PE.  She is impatient to leave but is reassured that testing is unremarkable.  I will have her follow-up closely with cardiology, strict return precautions of any worsening or change    ____________________________________________   FINAL CLINICAL IMPRESSION(S) / ED DIAGNOSES  Final diagnoses:  Atypical chest pain        Note:  This document was prepared using Dragon voice recognition software and may include unintentional dictation errors.   Lavonia Drafts, MD 02/29/20 1500

## 2020-04-03 ENCOUNTER — Other Ambulatory Visit: Payer: Self-pay

## 2020-04-03 ENCOUNTER — Ambulatory Visit: Payer: Medicaid Other | Attending: Family Medicine | Admitting: Family Medicine

## 2020-04-03 DIAGNOSIS — Z20822 Contact with and (suspected) exposure to covid-19: Secondary | ICD-10-CM | POA: Diagnosis not present

## 2020-04-03 DIAGNOSIS — G43809 Other migraine, not intractable, without status migrainosus: Secondary | ICD-10-CM

## 2020-04-03 DIAGNOSIS — J4520 Mild intermittent asthma, uncomplicated: Secondary | ICD-10-CM | POA: Diagnosis not present

## 2020-04-03 MED ORDER — DULERA 200-5 MCG/ACT IN AERO: 2.0000 | INHALATION_SPRAY | Freq: Two times a day (BID) | RESPIRATORY_TRACT | 3 refills | Status: DC

## 2020-04-03 MED ORDER — SUMATRIPTAN SUCCINATE 100 MG PO TABS: ORAL_TABLET | ORAL | 1 refills | Status: DC

## 2020-04-03 NOTE — Progress Notes (Signed)
Virtual Visit via Telephone Note  I connected with Kelly Byrd, on 04/03/2020 at 10:43 AM by telephone due to the COVID-19 pandemic and verified that I am speaking with the correct person using two identifiers.   Consent: I discussed the limitations, risks, security and privacy concerns of performing an evaluation and management service by telephone and the availability of in person appointments. I also discussed with the patient that there may be a patient responsible charge related to this service. The patient expressed understanding and agreed to proceed.   Location of Patient: Out and about  Location of Provider: Home   Persons participating in Telemedicine visit: Araya Sabino Donovan Farrington-CMA Dr. Margarita Rana     History of Present Illness: Kelly Byrd is a 45 year old female with a history of hypertension, asthma, migraine, anemia (secondary to menorrhagia from fibroids), chronic low back pain from lumbar degenerative disc disease, slipped capital femoral epiphysisseen for an acute visit.   Her daughter and Kelly Byrd daughter have COVID-19 and she has been the major caregiver of her Kelly Byrd daughter since her daughter is hospitalized. For the last 5 days she has had fever, cold chills, Diarrhea and she has been taking Robitussin. Symptoms are worse at night. Currently on her way to go get tested at the Urgent care. She has not been vaccinated against COVID-19. She has headaches but has no pain medication as she ran out of her Imitrex and chronic opioids and is unable to see her Pain specialist due to her symptoms.  Past Medical History:  Diagnosis Date  . Asthma   . Back pain   . Chronic kidney disease    frequent infrections  . Depression   . Dislocation of hip ([place])    right hip  . GERD (gastroesophageal reflux disease)   . Hypertension   . Migraines   . Pre-diabetes   . Sickle cell trait (HCC)    Allergies  Allergen Reactions  . Ace  Inhibitors Swelling    Throat and face  . Lisinopril Other (See Comments)    Swelling--caused the pt to stay in the hospital  . Peanuts [Peanut Oil] Other (See Comments)    Swelling--caused the pt to stay in the hospital    Current Outpatient Medications on File Prior to Visit  Medication Sig Dispense Refill  . albuterol (VENTOLIN HFA) 108 (90 Base) MCG/ACT inhaler Inhale 2 puffs into the lungs every 6 (six) hours as needed for wheezing or shortness of breath (wheezing). 18 g 1  . amLODipine (NORVASC) 10 MG tablet Take 1 tablet (10 mg total) by mouth daily. 30 tablet 1  . atorvastatin (LIPITOR) 20 MG tablet Take 1 tablet (20 mg total) by mouth daily. 30 tablet 3  . baclofen (LIORESAL) 10 MG tablet Take 10 mg by mouth in the morning and at bedtime.     . celecoxib (CELEBREX) 100 MG capsule Take 100 mg by mouth daily.    . diclofenac Sodium (VOLTAREN) 1 % GEL Apply 1 application topically 4 (four) times daily as needed (pain.).     Marland Kitchen diphenhydrAMINE (BENADRYL) 50 MG tablet Take 1 tablet (50 mg total) by mouth every 8 (eight) hours as needed for allergies. (Patient taking differently: Take 50 mg by mouth every evening.) 30 tablet 0  . EPINEPHrine 0.3 mg/0.3 mL IJ SOAJ injection Inject 0.3 mLs (0.3 mg total) into the muscle as needed for anaphylaxis. 2 each 2  . famotidine (PEPCID) 20 MG tablet Take 1 tablet (20 mg total) by mouth  2 (two) times daily. 60 tablet 0  . gabapentin (NEURONTIN) 300 MG capsule Take 2 capsules (600 mg total) by mouth 2 (two) times daily. 120 capsule 0  . montelukast (SINGULAIR) 10 MG tablet Take 1 tablet (10 mg total) by mouth at bedtime. 30 tablet 3  . ondansetron (ZOFRAN ODT) 4 MG disintegrating tablet Take 1 tablet (4 mg total) by mouth every 8 (eight) hours as needed for nausea or vomiting. 20 tablet 0  . oxyCODONE-acetaminophen (PERCOCET) 7.5-325 MG tablet Take 1 tablet by mouth every 8 (eight) hours as needed (pain.).     Marland Kitchen potassium chloride (KLOR-CON) 10 MEQ  tablet Take 1 tablet (10 mEq total) by mouth 2 (two) times daily. 20 tablet 0  . QUEtiapine (SEROQUEL) 200 MG tablet Take 200 mg by mouth at bedtime.    . sertraline (ZOLOFT) 100 MG tablet Take 100 mg by mouth daily.    . SUMAtriptan (IMITREX) 100 MG tablet 100 mg orally at the onset of a migraine, may repeat in 2 hours if headache persists or recurs. Max 200mg  in 24 hrs (Patient taking differently: Take 100 mg by mouth every 2 (two) hours as needed for migraine. 100 mg orally at the onset of a migraine, may repeat in 2 hours if headache persists or recurs. Max 200mg  in 24 hrs) 30 tablet 1  . topiramate (TOPAMAX) 50 MG tablet Take 2 tablets (100 mg total) by mouth 2 (two) times daily. (Patient taking differently: Take 50 mg by mouth 2 (two) times daily.) 60 tablet 3  . traZODone (DESYREL) 100 MG tablet Take 1 tablet (100 mg total) by mouth at bedtime. 30 tablet 0  . Vitamin D, Ergocalciferol, (DRISDOL) 1.25 MG (50000 UNIT) CAPS capsule Take 50,000 Units by mouth every Monday.      No current facility-administered medications on file prior to visit.    Observations/Objective: Awake, alert, oriented x3 Not in acute distress  Assessment and Plan: 1. Other migraine without status migrainosus, not intractable Uncontrolled due to running  Out of Imitrex which I have refilled - SUMAtriptan (IMITREX) 100 MG tablet; 100 mg orally at the onset of a migraine, may repeat in 2 hours if headache persists or recurs. Max 200mg  in 24 hrs  Dispense: 30 tablet; Refill: 1  2. Exposure to COVID-19 virus On her way to go get tested Advised to quarantine and will receive further instructions after test results  3. Mild intermittent asthma without complication She has been out of Dulera which I have refilled - mometasone-formoterol (DULERA) 200-5 MCG/ACT AERO; Inhale 2 puffs into the lungs in the morning and at bedtime.  Dispense: 1 each; Refill: 3   Follow Up Instructions: 3 weeks for complete physical exam    I discussed the assessment and treatment plan with the patient. The patient was provided an opportunity to ask questions and all were answered. The patient agreed with the plan and demonstrated an understanding of the instructions.   The patient was advised to call back or seek an in-person evaluation if the symptoms worsen or if the condition fails to improve as anticipated.     I provided 16 minutes total of non-face-to-face time during this encounter including median intraservice time, reviewing previous notes, investigations, ordering medications, medical decision making, coordinating care and patient verbalized understanding at the end of the visit.     , MD, FAAFP. [facility] [place], [place] [facility]   04/03/2020, 10:43 AM

## 2020-04-03 NOTE — Progress Notes (Signed)
Wants to get covid test, she was exposed to covid  She has had diarrheas,fever,chills, and coughing,

## 2020-04-18 ENCOUNTER — Other Ambulatory Visit: Payer: Self-pay | Admitting: Family Medicine

## 2020-04-18 DIAGNOSIS — I10 Essential (primary) hypertension: Secondary | ICD-10-CM

## 2020-04-20 ENCOUNTER — Emergency Department
Admission: EM | Admit: 2020-04-20 | Discharge: 2020-04-20 | Disposition: A | Discharge status: Home / Self Care | Payer: Medicaid Other | Attending: Emergency Medicine | Admitting: Emergency Medicine

## 2020-04-20 ENCOUNTER — Other Ambulatory Visit: Payer: Self-pay

## 2020-04-20 ENCOUNTER — Emergency Department: Payer: Medicaid Other

## 2020-04-20 DIAGNOSIS — Z5321 Procedure and treatment not carried out due to patient leaving prior to being seen by health care provider: Secondary | ICD-10-CM | POA: Insufficient documentation

## 2020-04-20 DIAGNOSIS — M79602 Pain in left arm: Secondary | ICD-10-CM | POA: Insufficient documentation

## 2020-04-20 DIAGNOSIS — R079 Chest pain, unspecified: Secondary | ICD-10-CM | POA: Diagnosis present

## 2020-04-20 LAB — CBC
HCT: 41.3 % (ref 36.0–46.0)
Hemoglobin: 12.9 g/dL (ref 12.0–15.0)
MCH: 22 pg — ABNORMAL LOW (ref 26.0–34.0)
MCHC: 31.2 g/dL (ref 30.0–36.0)
MCV: 70.5 fL — ABNORMAL LOW (ref 80.0–100.0)
Platelets: 380 10*3/uL (ref 150–400)
RBC: 5.86 MIL/uL — ABNORMAL HIGH (ref 3.87–5.11)
RDW: 20.3 % — ABNORMAL HIGH (ref 11.5–15.5)
WBC: 8.8 10*3/uL (ref 4.0–10.5)
nRBC: 0 % (ref 0.0–0.2)

## 2020-04-20 LAB — BASIC METABOLIC PANEL
Anion gap: 12 (ref 5–15)
BUN: 8 mg/dL (ref 6–20)
CO2: 25 mmol/L (ref 22–32)
Calcium: 8.8 mg/dL — ABNORMAL LOW (ref 8.9–10.3)
Chloride: 103 mmol/L (ref 98–111)
Creatinine, Ser: 0.51 mg/dL (ref 0.44–1.00)
GFR, Estimated: >60 mL/min (ref >60)
Glucose, Bld: 85 mg/dL (ref 70–99)
Potassium: 2.8 mmol/L — ABNORMAL LOW (ref 3.5–5.1)
Sodium: 140 mmol/L (ref 135–145)

## 2020-04-20 LAB — TROPONIN I (HIGH SENSITIVITY): Troponin I (High Sensitivity): 12 ng/L (ref <18)

## 2020-04-20 NOTE — ED Notes (Signed)
Called lab to ask about lab values. Lab reports blood tubes are down there and adds on labs.

## 2020-04-20 NOTE — ED Notes (Signed)
Pt to front desk requesting IV be taken out and that she is going to follow up with her doctor.

## 2020-04-20 NOTE — ED Notes (Signed)
Pt requesting to have IV taken out so that she can go to another hospital because of wait. Pt encouraged to wait until troponin comes back before leaving. Pt agreeable.

## 2020-04-20 NOTE — ED Triage Notes (Signed)
Pt comes ems from home with CP and left arm pain. Had 324mg  asp with ems and 1 sl nitro with pain from 10/10 to 8/10 and HTN.

## 2020-04-21 ENCOUNTER — Telehealth: Payer: Self-pay | Admitting: Emergency Medicine

## 2020-04-21 NOTE — Telephone Encounter (Signed)
Called patient due to left emergency department before provider exam to inquire about condition and follow up plans.  She has called her doctor this am.  I told her that it was concerning that potassium was low and that she needs to discuss that with her doctor.  She will call them now.

## 2020-04-25 ENCOUNTER — Other Ambulatory Visit: Payer: Self-pay

## 2020-04-25 ENCOUNTER — Ambulatory Visit: Payer: Medicaid Other | Attending: Internal Medicine | Admitting: Internal Medicine

## 2020-04-25 ENCOUNTER — Other Ambulatory Visit (HOSPITAL_COMMUNITY)
Admission: RE | Admit: 2020-04-25 | Discharge: 2020-04-25 | Disposition: A | Discharge status: Home / Self Care | Payer: Medicaid Other | Source: Ambulatory Visit | Attending: Internal Medicine | Admitting: Internal Medicine

## 2020-04-25 DIAGNOSIS — N898 Other specified noninflammatory disorders of vagina: Secondary | ICD-10-CM | POA: Diagnosis present

## 2020-04-25 MED ORDER — FLUCONAZOLE 150 MG PO TABS: 150.0000 mg | ORAL_TABLET | Freq: Once | ORAL | 0 refills | Status: AC

## 2020-04-25 NOTE — Progress Notes (Signed)
Pt states she got monistat otc and it made her irritated

## 2020-04-25 NOTE — Progress Notes (Signed)
Virtual Visit via Telephone Note  I connected with Kelly Byrd on 04/25/20 at 4:54 p.m by telephone and verified that I am speaking with the correct person using two identifiers.  Location: Patient: Home Provider: Office  The patient, my CMA Ms. Sallyanne Havers, myself participated in this context. I discussed the limitations, risks, security and privacy concerns of performing an evaluation and management service by telephone and the availability of in person appointments. I also discussed with the patient that there may be a patient responsible charge related to this service. The patient expressed understanding and agreed to proceed.   History of Present Illness: Kelly Byrd is a 45 year old female with a history of hypertension, asthma, migraine, anemia (secondary to menorrhagia from fibroids), chronic low back pain from lumbar degenerative disc disease, slipped capital femoral epiphysisseen foran acute visit.   Pt c/o vaginal itching x 2 days.  No dischg. Thinks it is yeast.  Used Monistat cream and caused vaginal irritation. She is wondering a pill for vaginal yeast infection. She came by earlier today and did a self swab. Outpatient Encounter Medications as of 04/25/2020  Medication Sig  . albuterol (VENTOLIN HFA) 108 (90 Base) MCG/ACT inhaler Inhale 2 puffs into the lungs every 6 (six) hours as needed for wheezing or shortness of breath (wheezing).  Marland Kitchen amLODipine (NORVASC) 10 MG tablet Take 1 tablet by mouth once daily  . atorvastatin (LIPITOR) 20 MG tablet Take 1 tablet (20 mg total) by mouth daily.  . baclofen (LIORESAL) 10 MG tablet Take 10 mg by mouth in the morning and at bedtime.   . celecoxib (CELEBREX) 100 MG capsule Take 100 mg by mouth daily.  . diclofenac Sodium (VOLTAREN) 1 % GEL Apply 1 application topically 4 (four) times daily as needed (pain.).   Marland Kitchen diphenhydrAMINE (BENADRYL) 50 MG tablet Take 1 tablet (50 mg total) by mouth every 8 (eight) hours as needed for  allergies. (Patient taking differently: Take 50 mg by mouth every evening.)  . EPINEPHrine 0.3 mg/0.3 mL IJ SOAJ injection Inject 0.3 mLs (0.3 mg total) into the muscle as needed for anaphylaxis.  . famotidine (PEPCID) 20 MG tablet Take 1 tablet (20 mg total) by mouth 2 (two) times daily.  Marland Kitchen gabapentin (NEURONTIN) 300 MG capsule Take 2 capsules (600 mg total) by mouth 2 (two) times daily.  . mometasone-formoterol (DULERA) 200-5 MCG/ACT AERO Inhale 2 puffs into the lungs in the morning and at bedtime.  . montelukast (SINGULAIR) 10 MG tablet Take 1 tablet (10 mg total) by mouth at bedtime.  . ondansetron (ZOFRAN ODT) 4 MG disintegrating tablet Take 1 tablet (4 mg total) by mouth every 8 (eight) hours as needed for nausea or vomiting.  Marland Kitchen oxyCODONE-acetaminophen (PERCOCET) 7.5-325 MG tablet Take 1 tablet by mouth every 8 (eight) hours as needed (pain.).   Marland Kitchen potassium chloride (KLOR-CON) 10 MEQ tablet Take 1 tablet (10 mEq total) by mouth 2 (two) times daily.  . QUEtiapine (SEROQUEL) 200 MG tablet Take 200 mg by mouth at bedtime.  . sertraline (ZOLOFT) 100 MG tablet Take 100 mg by mouth daily.  . SUMAtriptan (IMITREX) 100 MG tablet 100 mg orally at the onset of a migraine, may repeat in 2 hours if headache persists or recurs. Max 200mg  in 24 hrs  . topiramate (TOPAMAX) 50 MG tablet Take 2 tablets (100 mg total) by mouth 2 (two) times daily. (Patient taking differently: Take 50 mg by mouth 2 (two) times daily.)  . traZODone (DESYREL) 100 MG tablet Take 1  tablet (100 mg total) by mouth at bedtime.  . Vitamin D, Ergocalciferol, (DRISDOL) 1.25 MG (50000 UNIT) CAPS capsule Take 50,000 Units by mouth every Monday.    No facility-administered encounter medications on file as of 04/25/2020.    Observations/Objective: No direct observation done as this was a telephone.  Assessment and Plan: 1. Vaginal itching We will treat empirically for yeast with Diflucan. She will be notified of the results of the self  swab once it is resulted. - Cervicovaginal ancillary only - fluconazole (DIFLUCAN) 150 MG tablet; Take 1 tablet (150 mg total) by mouth once for 1 dose.  Dispense: 1 tablet; Refill: 0   Follow Up Instructions: As needed   I discussed the assessment and treatment plan with the patient. The patient was provided an opportunity to ask questions and all were answered. The patient agreed with the plan and demonstrated an understanding of the instructions.   The patient was advised to call back or seek an in-person evaluation if the symptoms worsen or if the condition fails to improve as anticipated.  I provided 4 minutes minutes of non-face-to-face time during this encounter.   Karle Plumber, MD

## 2020-04-28 ENCOUNTER — Other Ambulatory Visit: Payer: Self-pay | Admitting: Internal Medicine

## 2020-04-28 ENCOUNTER — Telehealth: Payer: Self-pay | Admitting: Family Medicine

## 2020-04-28 LAB — CERVICOVAGINAL ANCILLARY ONLY
Bacterial Vaginitis (gardnerella): POSITIVE — AB
Candida Glabrata: NEGATIVE
Candida Vaginitis: POSITIVE — AB
Chlamydia: NEGATIVE
Comment: NEGATIVE
Comment: NEGATIVE
Comment: NEGATIVE
Comment: NEGATIVE
Comment: NEGATIVE
Comment: NORMAL
Neisseria Gonorrhea: NEGATIVE
Trichomonas: NEGATIVE

## 2020-04-28 MED ORDER — METRONIDAZOLE 500 MG PO TABS: 500.0000 mg | ORAL_TABLET | Freq: Two times a day (BID) | ORAL | 0 refills | Status: DC

## 2020-04-28 NOTE — Telephone Encounter (Signed)
Will route to PCP for review. 

## 2020-04-28 NOTE — Telephone Encounter (Signed)
FYI   Patient has an appt scheduled for 04/30/20   Copied from [place] [phone]. Topic: General - Other >> Apr 21, 2020  3:52 PM Leward Quan A wrote: Reason for CRM: Patient called in to inform Dr Margarita Rana that she went to the ER in an ambulance on 04/20/20 thought she was having a heart attack. Per patient she was given Nitro glycerin but also found out that her Potassium is at a 2.8 and that is very low.  Asking if Dr Margarita Rana can please send an Rx to her Pharmacy for her to pick up and start taking. Please call Ph# [phone]

## 2020-04-29 MED ORDER — POTASSIUM CHLORIDE ER 20 MEQ PO TBCR
20.0000 meq | EXTENDED_RELEASE_TABLET | Freq: Every day | ORAL | 3 refills | Status: DC
Start: 2020-04-29 — End: 2020-10-24

## 2020-04-29 NOTE — Telephone Encounter (Signed)
Pt was called and informed of medication being sent to pharmacy. 

## 2020-04-29 NOTE — Telephone Encounter (Signed)
Prescription has been sent to her pharmacy

## 2020-04-30 ENCOUNTER — Encounter: Payer: Medicaid Other | Admitting: Family Medicine

## 2020-04-30 ENCOUNTER — Telehealth: Payer: Self-pay

## 2020-04-30 NOTE — Telephone Encounter (Signed)
Contacted pt to go over lab results pt is aware and doesn't have any questions or concerns 

## 2020-05-14 ENCOUNTER — Other Ambulatory Visit: Payer: Self-pay

## 2020-05-14 ENCOUNTER — Encounter: Payer: Self-pay | Admitting: Family Medicine

## 2020-05-14 ENCOUNTER — Ambulatory Visit: Payer: Medicaid Other | Attending: Family Medicine | Admitting: Family Medicine

## 2020-05-14 VITALS — BP 142/83 | HR 85 | Ht 64.0 in | Wt 236.2 lb

## 2020-05-14 DIAGNOSIS — Z0001 Encounter for general adult medical examination with abnormal findings: Secondary | ICD-10-CM | POA: Diagnosis present

## 2020-05-14 DIAGNOSIS — B373 Candidiasis of vulva and vagina: Secondary | ICD-10-CM | POA: Insufficient documentation

## 2020-05-14 DIAGNOSIS — D500 Iron deficiency anemia secondary to blood loss (chronic): Secondary | ICD-10-CM | POA: Diagnosis not present

## 2020-05-14 DIAGNOSIS — Z1231 Encounter for screening mammogram for malignant neoplasm of breast: Secondary | ICD-10-CM

## 2020-05-14 DIAGNOSIS — J45909 Unspecified asthma, uncomplicated: Secondary | ICD-10-CM | POA: Diagnosis not present

## 2020-05-14 DIAGNOSIS — Z791 Long term (current) use of non-steroidal anti-inflammatories (NSAID): Secondary | ICD-10-CM | POA: Diagnosis not present

## 2020-05-14 DIAGNOSIS — Z7984 Long term (current) use of oral hypoglycemic drugs: Secondary | ICD-10-CM | POA: Diagnosis not present

## 2020-05-14 DIAGNOSIS — N3000 Acute cystitis without hematuria: Secondary | ICD-10-CM | POA: Diagnosis not present

## 2020-05-14 DIAGNOSIS — Z7951 Long term (current) use of inhaled steroids: Secondary | ICD-10-CM | POA: Insufficient documentation

## 2020-05-14 DIAGNOSIS — Z0000 Encounter for general adult medical examination without abnormal findings: Secondary | ICD-10-CM | POA: Diagnosis not present

## 2020-05-14 DIAGNOSIS — M5136 Other intervertebral disc degeneration, lumbar region: Secondary | ICD-10-CM | POA: Insufficient documentation

## 2020-05-14 DIAGNOSIS — Z8249 Family history of ischemic heart disease and other diseases of the circulatory system: Secondary | ICD-10-CM | POA: Diagnosis not present

## 2020-05-14 DIAGNOSIS — N189 Chronic kidney disease, unspecified: Secondary | ICD-10-CM | POA: Insufficient documentation

## 2020-05-14 DIAGNOSIS — G8929 Other chronic pain: Secondary | ICD-10-CM | POA: Insufficient documentation

## 2020-05-14 DIAGNOSIS — I129 Hypertensive chronic kidney disease with stage 1 through stage 4 chronic kidney disease, or unspecified chronic kidney disease: Secondary | ICD-10-CM | POA: Diagnosis not present

## 2020-05-14 DIAGNOSIS — B3731 Acute candidiasis of vulva and vagina: Secondary | ICD-10-CM

## 2020-05-14 DIAGNOSIS — Z79899 Other long term (current) drug therapy: Secondary | ICD-10-CM | POA: Insufficient documentation

## 2020-05-14 DIAGNOSIS — R7303 Prediabetes: Secondary | ICD-10-CM | POA: Diagnosis not present

## 2020-05-14 LAB — POCT URINALYSIS DIP (CLINITEK)
Bilirubin, UA: NEGATIVE
Glucose, UA: NEGATIVE mg/dL
Ketones, POC UA: NEGATIVE mg/dL
Leukocytes, UA: NEGATIVE
Nitrite, UA: POSITIVE — AB
POC PROTEIN,UA: 100 — AB
Spec Grav, UA: 1.025 (ref 1.010–1.025)
Urobilinogen, UA: 1 E.U./dL
pH, UA: 7 (ref 5.0–8.0)

## 2020-05-14 LAB — POCT GLYCOSYLATED HEMOGLOBIN (HGB A1C): HbA1c, POC (controlled diabetic range): 6.2 % (ref 0.0–7.0)

## 2020-05-14 MED ORDER — FLUCONAZOLE 150 MG PO TABS: 150.0000 mg | ORAL_TABLET | Freq: Once | ORAL | 1 refills | Status: AC

## 2020-05-14 MED ORDER — CEPHALEXIN 500 MG PO CAPS: 500.0000 mg | ORAL_CAPSULE | Freq: Two times a day (BID) | ORAL | 0 refills | Status: DC

## 2020-05-14 MED ORDER — METFORMIN HCL 500 MG PO TABS: 500.0000 mg | ORAL_TABLET | Freq: Two times a day (BID) | ORAL | 1 refills | Status: DC

## 2020-05-14 NOTE — Progress Notes (Signed)
Keeps getting yeast infections, she think its due to a medication she is taking.

## 2020-05-14 NOTE — Patient Instructions (Signed)
Health Maintenance, Female Adopting a healthy lifestyle and getting preventive care are important in promoting health and wellness. Ask your health care provider about:  The right schedule for you to have regular tests and exams.  Things you can do on your own to prevent diseases and keep yourself healthy. What should I know about diet, weight, and exercise? Eat a healthy diet  Eat a diet that includes plenty of vegetables, fruits, low-fat dairy products, and lean protein.  Do not eat a lot of foods that are high in solid fats, added sugars, or sodium.   Maintain a healthy weight Body mass index (BMI) is used to identify weight problems. It estimates body fat based on height and weight. Your health care provider can help determine your BMI and help you achieve or maintain a healthy weight. Get regular exercise Get regular exercise. This is one of the most important things you can do for your health. Most adults should:  Exercise for at least 150 minutes each week. The exercise should increase your heart rate and make you sweat (moderate-intensity exercise).  Do strengthening exercises at least twice a week. This is in addition to the moderate-intensity exercise.  Spend less time sitting. Even light physical activity can be beneficial. Watch cholesterol and blood lipids Have your blood tested for lipids and cholesterol at 44 years of age, then have this test every 5 years. Have your cholesterol levels checked more often if:  Your lipid or cholesterol levels are high.  You are older than 44 years of age.  You are at high risk for heart disease. What should I know about cancer screening? Depending on your health history and family history, you may need to have cancer screening at various ages. This may include screening for:  Breast cancer.  Cervical cancer.  Colorectal cancer.  Skin cancer.  Lung cancer. What should I know about heart disease, diabetes, and high blood  pressure? Blood pressure and heart disease  High blood pressure causes heart disease and increases the risk of stroke. This is more likely to develop in people who have high blood pressure readings, are of African descent, or are overweight.  Have your blood pressure checked: ? Every 3-5 years if you are 18-39 years of age. ? Every year if you are 40 years old or older. Diabetes Have regular diabetes screenings. This checks your fasting blood sugar level. Have the screening done:  Once every three years after age 40 if you are at a normal weight and have a low risk for diabetes.  More often and at a younger age if you are overweight or have a high risk for diabetes. What should I know about preventing infection? Hepatitis B If you have a higher risk for hepatitis B, you should be screened for this virus. Talk with your health care provider to find out if you are at risk for hepatitis B infection. Hepatitis C Testing is recommended for:  Everyone born from 1945 through 1965.  Anyone with known risk factors for hepatitis C. Sexually transmitted infections (STIs)  Get screened for STIs, including gonorrhea and chlamydia, if: ? You are sexually active and are younger than 44 years of age. ? You are older than 44 years of age and your health care provider tells you that you are at risk for this type of infection. ? Your sexual activity has changed since you were last screened, and you are at increased risk for chlamydia or gonorrhea. Ask your health care provider   if you are at risk.  Ask your health care provider about whether you are at high risk for HIV. Your health care provider may recommend a prescription medicine to help prevent HIV infection. If you choose to take medicine to prevent HIV, you should first get tested for HIV. You should then be tested every 3 months for as long as you are taking the medicine. Pregnancy  If you are about to stop having your period (premenopausal) and  you may become pregnant, seek counseling before you get pregnant.  Take 400 to 800 micrograms (mcg) of folic acid every day if you become pregnant.  Ask for birth control (contraception) if you want to prevent pregnancy. Osteoporosis and menopause Osteoporosis is a disease in which the bones lose minerals and strength with aging. This can result in bone fractures. If you are 65 years old or older, or if you are at risk for osteoporosis and fractures, ask your health care provider if you should:  Be screened for bone loss.  Take a calcium or vitamin D supplement to lower your risk of fractures.  Be given hormone replacement therapy (HRT) to treat symptoms of menopause. Follow these instructions at home: Lifestyle  Do not use any products that contain nicotine or tobacco, such as cigarettes, e-cigarettes, and chewing tobacco. If you need help quitting, ask your health care provider.  Do not use street drugs.  Do not share needles.  Ask your health care provider for help if you need support or information about quitting drugs. Alcohol use  Do not drink alcohol if: ? Your health care provider tells you not to drink. ? You are pregnant, may be pregnant, or are planning to become pregnant.  If you drink alcohol: ? Limit how much you use to 0-1 drink a day. ? Limit intake if you are breastfeeding.  Be aware of how much alcohol is in your drink. In the [place] one drink equals one 12 oz bottle of beer (355 mL), one 5 oz glass of wine (148 mL), or one 1 oz glass of hard liquor (44 mL). General instructions  Schedule regular health, dental, and eye exams.  Stay current with your vaccines.  Tell your health care provider if: ? You often feel depressed. ? You have ever been abused or do not feel safe at home. Summary  Adopting a healthy lifestyle and getting preventive care are important in promoting health and wellness.  Follow your health care provider's instructions about healthy  diet, exercising, and getting tested or screened for diseases.  Follow your health care provider's instructions on monitoring your cholesterol and blood pressure. This information is not intended to replace advice given to you by your health care provider. Make sure you discuss any questions you have with your health care provider. Document Revised: 03/08/2018 Document Reviewed: 03/08/2018 Elsevier Patient Education  2021 [employer]  

## 2020-05-14 NOTE — Progress Notes (Signed)
Subjective:  Patient ID: Kelly Byrd, female    DOB: [birth year]  Age: 45 y.o. MRN: [id number]  CC: Annual Exam   HPI Kailany Dandrea Medders is a 45 year old female with a history of hypertension, asthma, migraine, anemia (secondary to menorrhagia from fibroids), chronic low back pain from lumbar degenerative disc disease, slipped capital femoral epiphysisseen foran annual physical exam.  She keeps getting a yeast infection as she has itching and is having to repeatedly request Diflucan pill.  She is up-to-date on her Pap smear from 09/2019. Due for mammogram today. Past Medical History:  Diagnosis Date  . Asthma   . Back pain   . Chronic kidney disease    frequent infrections  . Depression   . Dislocation of hip ([place])    right hip  . GERD (gastroesophageal reflux disease)   . Hypertension   . Migraines   . Pre-diabetes   . Sickle cell trait [facility])     Past Surgical History:  Procedure Laterality Date  . CESAREAN SECTION     2  . CYSTOSCOPY N/A 10/23/2019   Procedure: CYSTOSCOPY;  Surgeon: Malachy Mood, MD;  Location: [facility] ORS;  Service: Gynecology;  Laterality: N/A;  . TOTAL LAPAROSCOPIC HYSTERECTOMY WITH SALPINGECTOMY Bilateral 10/23/2019   Procedure: TOTAL LAPAROSCOPIC HYSTERECTOMY CONVERTED TO OPEN SUPRACERVICAL HYSTERECTOMY;  Surgeon: Malachy Mood, MD;  Location: [facility] ORS;  Service: Gynecology;  Laterality: Bilateral;  . TUBAL LIGATION    . upper teeth      Family History  Problem Relation Age of Onset  . Hypertension Mother   . Hypertension Father   . Diabetes Mellitus II Maternal Grandmother   . Lupus Other     Allergies  Allergen Reactions  . Ace Inhibitors Swelling    Throat and face  . Lisinopril Other (See Comments)    Swelling--caused the pt to stay in the hospital  . Peanuts [Peanut Oil] Other (See Comments)    Swelling--caused the pt to stay in the hospital    Outpatient Medications Prior to Visit  Medication Sig Dispense  Refill  . albuterol (VENTOLIN HFA) 108 (90 Base) MCG/ACT inhaler Inhale 2 puffs into the lungs every 6 (six) hours as needed for wheezing or shortness of breath (wheezing). 18 g 1  . amLODipine (NORVASC) 10 MG tablet Take 1 tablet by mouth once daily 30 tablet 0  . atorvastatin (LIPITOR) 20 MG tablet Take 1 tablet (20 mg total) by mouth daily. 30 tablet 3  . baclofen (LIORESAL) 10 MG tablet Take 10 mg by mouth in the morning and at bedtime.     . celecoxib (CELEBREX) 100 MG capsule Take 100 mg by mouth daily.    . diclofenac Sodium (VOLTAREN) 1 % GEL Apply 1 application topically 4 (four) times daily as needed (pain.).     Marland Kitchen diphenhydrAMINE (BENADRYL) 50 MG tablet Take 1 tablet (50 mg total) by mouth every 8 (eight) hours as needed for allergies. (Patient taking differently: Take 50 mg by mouth every evening.) 30 tablet 0  . EPINEPHrine 0.3 mg/0.3 mL IJ SOAJ injection Inject 0.3 mLs (0.3 mg total) into the muscle as needed for anaphylaxis. 2 each 2  . famotidine (PEPCID) 20 MG tablet Take 1 tablet (20 mg total) by mouth 2 (two) times daily. 60 tablet 0  . gabapentin (NEURONTIN) 300 MG capsule Take 2 capsules (600 mg total) by mouth 2 (two) times daily. 120 capsule 0  . mometasone-formoterol (DULERA) 200-5 MCG/ACT AERO Inhale 2 puffs into the lungs in  the morning and at bedtime. 1 each 3  . montelukast (SINGULAIR) 10 MG tablet Take 1 tablet (10 mg total) by mouth at bedtime. 30 tablet 3  . ondansetron (ZOFRAN ODT) 4 MG disintegrating tablet Take 1 tablet (4 mg total) by mouth every 8 (eight) hours as needed for nausea or vomiting. 20 tablet 0  . oxyCODONE-acetaminophen (PERCOCET) 7.5-325 MG tablet Take 1 tablet by mouth every 8 (eight) hours as needed (pain.).     Marland Kitchen potassium chloride 20 MEQ TBCR Take 20 mEq by mouth daily. 30 tablet 3  . QUEtiapine (SEROQUEL) 200 MG tablet Take 200 mg by mouth at bedtime.    . sertraline (ZOLOFT) 100 MG tablet Take 100 mg by mouth daily.    . SUMAtriptan (IMITREX)  100 MG tablet 100 mg orally at the onset of a migraine, may repeat in 2 hours if headache persists or recurs. Max 200mg  in 24 hrs 30 tablet 1  . topiramate (TOPAMAX) 50 MG tablet Take 2 tablets (100 mg total) by mouth 2 (two) times daily. (Patient taking differently: Take 50 mg by mouth 2 (two) times daily.) 60 tablet 3  . traZODone (DESYREL) 100 MG tablet Take 1 tablet (100 mg total) by mouth at bedtime. 30 tablet 0  . Vitamin D, Ergocalciferol, (DRISDOL) 1.25 MG (50000 UNIT) CAPS capsule Take 50,000 Units by mouth every Monday.     . metroNIDAZOLE (FLAGYL) 500 MG tablet Take 1 tablet (500 mg total) by mouth 2 (two) times daily. (Patient not taking: Reported on 05/14/2020) 14 tablet 0   No facility-administered medications prior to visit.     ROS Review of Systems  Constitutional: Negative for activity change, appetite change and fatigue.  HENT: Negative for congestion, sinus pressure and sore throat.   Eyes: Negative for visual disturbance.  Respiratory: Negative for cough, chest tightness, shortness of breath and wheezing.   Cardiovascular: Negative for chest pain and palpitations.  Gastrointestinal: Negative for abdominal distention, abdominal pain and constipation.  Endocrine: Negative for polydipsia.  Genitourinary: Negative for dysuria, frequency and vaginal discharge.  Musculoskeletal: Negative for arthralgias and back pain.  Skin: Negative for rash.  Neurological: Negative for tremors, light-headedness and numbness.  Hematological: Does not bruise/bleed easily.  Psychiatric/Behavioral: Negative for agitation and behavioral problems.    Objective:  BP (!) 142/83   Pulse 85   Ht 5\' 4"  (1.626 m)   Wt 236 lb 3.2 oz (107.1 kg)   SpO2 98%   BMI 40.54 kg/m   BP/Weight 05/14/2020 04/20/2020 56/05/8754  Systolic BP 433 295 188  Diastolic BP 83 416 73  Wt. (Lbs) 236.2 225 237  BMI 40.54 38.62 40.68  Some encounter information is confidential and restricted. Go to Review Flowsheets  activity to see all data.      Physical Exam Constitutional:      General: She is not in acute distress.    Appearance: She is well-developed and well-nourished. She is not diaphoretic.  HENT:     Head: Normocephalic.     Right Ear: External ear normal.     Left Ear: External ear normal.     Nose: Nose normal.     Mouth/Throat:     Mouth: Oropharynx is clear and moist.  Eyes:     Extraocular Movements: EOM normal.     Conjunctiva/sclera: Conjunctivae normal.     Pupils: Pupils are equal, round, and reactive to light.  Neck:     Vascular: No JVD.  Cardiovascular:     Rate  and Rhythm: Normal rate and regular rhythm.     Pulses: Intact distal pulses.     Heart sounds: Normal heart sounds. No murmur heard. No gallop.   Pulmonary:     Effort: Pulmonary effort is normal. No respiratory distress.     Breath sounds: Normal breath sounds. No wheezing or rales.  Chest:     Chest wall: No tenderness.  Breasts:     Right: No mass, tenderness, axillary adenopathy or supraclavicular adenopathy.     Left: No mass, tenderness, axillary adenopathy or supraclavicular adenopathy.    Abdominal:     General: Bowel sounds are normal. There is no distension.     Palpations: Abdomen is soft. There is no mass.     Tenderness: There is no abdominal tenderness.  Musculoskeletal:        General: No tenderness or edema. Normal range of motion.     Cervical back: Normal range of motion.  Lymphadenopathy:     Upper Body:     Right upper body: No supraclavicular, axillary or pectoral adenopathy.     Left upper body: No supraclavicular, axillary or pectoral adenopathy.  Skin:    General: Skin is warm and dry.  Neurological:     Mental Status: She is alert and oriented to person, place, and time.     Deep Tendon Reflexes: Reflexes are normal and symmetric.  Psychiatric:        Mood and Affect: Mood and affect normal.     CMP Latest Ref Rng & Units 04/20/2020 02/29/2020 10/26/2019  Glucose 70 -  99 mg/dL 85 139(H) 110(H)  BUN 6 - 20 mg/dL 8 8 10   Creatinine 0.44 - 1.00 mg/dL 0.51 0.52 0.84  Sodium 135 - 145 mmol/L 140 137 141  Potassium 3.5 - 5.1 mmol/L 2.8(L) 2.8(L) 3.7  Chloride 98 - 111 mmol/L 103 100 108  CO2 22 - 32 mmol/L 25 24 26   Calcium 8.9 - 10.3 mg/dL 8.8(L) 9.3 8.7(L)  Total Protein 6.5 - 8.1 g/dL - - -  Total Bilirubin 0.3 - 1.2 mg/dL - - -  Alkaline Phos 38 - 126 U/L - - -  AST 15 - 41 U/L - - -  ALT 0 - 44 U/L - - -    Lipid Panel     Component Value Date/Time   CHOL 177 10/14/2017 0619   CHOL 193 08/06/2016 0840   TRIG 106 10/14/2017 0619   HDL 45 10/14/2017 0619   HDL 44 08/06/2016 0840   CHOLHDL 3.9 10/14/2017 0619   VLDL 21 10/14/2017 0619   LDLCALC 111 (H) 10/14/2017 0619   LDLCALC 132 (H) 08/06/2016 0840    CBC    Component Value Date/Time   WBC 8.8 04/20/2020 1752   RBC 5.86 (H) 04/20/2020 1752   HGB 12.9 04/20/2020 1752   HCT 41.3 04/20/2020 1752   PLT 380 04/20/2020 1752   MCV 70.5 (L) 04/20/2020 1752   MCH 22.0 (L) 04/20/2020 1752   MCHC 31.2 04/20/2020 1752   RDW 20.3 (H) 04/20/2020 1752   LYMPHSABS 3.1 09/03/2019 2235   MONOABS 0.7 09/03/2019 2235   EOSABS 0.6 (H) 09/03/2019 2235   BASOSABS 0.1 09/03/2019 2235    Lab Results  Component Value Date   HGBA1C 6.2 05/14/2020    Assessment & Plan:  1. Annual physical exam Counseled on 150 minutes of exercise per week, healthy eating (including decreased daily intake of saturated fats, cholesterol, added sugars, sodium), STI prevention, routine healthcare maintenance. -  Basic Metabolic Panel - POCT URINALYSIS DIP (CLINITEK) - POCT glycosylated hemoglobin (Hb A1C)  2. Encounter for screening mammogram for malignant neoplasm of breast - MM 3D SCREEN BREAST BILATERAL; Future  3. Vaginal candidiasis Due to recurrent vaginal candidiasis we will screen for diabetes mellitus - fluconazole (DIFLUCAN) 150 MG tablet; Take 1 tablet (150 mg total) by mouth once for 1 dose. Then repeat  in 72 hours  Dispense: 2 tablet; Refill: 1  4. Acute cystitis without hematuria - cephALEXin (KEFLEX) 500 MG capsule; Take 1 capsule (500 mg total) by mouth 2 (two) times daily.  Dispense: 6 capsule; Refill: 0  5. Prediabetes A1c of 6.2 Review of her chart indicates she previously had an A1c of 6.5 in 2016 and 6.8 in 2019-unsure if this was related to steroid use.  She is unaware of a history of diabetes After shared decision making she has consented to commencing Metformin especially in the light of recurrent vaginal candidiasis - metFORMIN (GLUCOPHAGE) 500 MG tablet; Take 1 tablet (500 mg total) by mouth 2 (two) times daily with a meal.  Dispense: 180 tablet; Refill: 1    Meds ordered this encounter  Medications  . cephALEXin (KEFLEX) 500 MG capsule    Sig: Take 1 capsule (500 mg total) by mouth 2 (two) times daily.    Dispense:  6 capsule    Refill:  0  . metFORMIN (GLUCOPHAGE) 500 MG tablet    Sig: Take 1 tablet (500 mg total) by mouth 2 (two) times daily with a meal.    Dispense:  180 tablet    Refill:  1  . fluconazole (DIFLUCAN) 150 MG tablet    Sig: Take 1 tablet (150 mg total) by mouth once for 1 dose. Then repeat in 72 hours    Dispense:  2 tablet    Refill:  1    Follow-up: Return in about 3 months (around 08/11/2020) for Chronic disease management.       Charlott Rakes, MD, FAAFP. [facility] [place], [place]   05/14/2020, 3:59 PM

## 2020-05-15 ENCOUNTER — Telehealth: Payer: Self-pay

## 2020-05-15 LAB — BASIC METABOLIC PANEL
BUN/Creatinine Ratio: 16 (ref 9–23)
BUN: 10 mg/dL (ref 6–24)
CO2: 25 mmol/L (ref 20–29)
Calcium: 9.7 mg/dL (ref 8.7–10.2)
Chloride: 102 mmol/L (ref 96–106)
Creatinine, Ser: 0.61 mg/dL (ref 0.57–1.00)
GFR calc Af Amer: 128 mL/min/{1.73_m2} (ref >59)
GFR calc non Af Amer: 111 mL/min/{1.73_m2} (ref >59)
Glucose: 91 mg/dL (ref 65–99)
Potassium: 3.9 mmol/L (ref 3.5–5.2)
Sodium: 141 mmol/L (ref 134–144)

## 2020-05-15 NOTE — Telephone Encounter (Signed)
Patient was called and a voicemail was left informing patient to return phone call for lab results.  Normal results letter mailed.

## 2020-05-15 NOTE — Telephone Encounter (Signed)
-----   Message from Charlott Rakes, MD sent at 05/15/2020 11:22 AM EST ----- Please inform the patient that labs are normal. Thank you.

## 2020-05-22 ENCOUNTER — Other Ambulatory Visit: Payer: Self-pay | Admitting: Family Medicine

## 2020-05-22 DIAGNOSIS — I10 Essential (primary) hypertension: Secondary | ICD-10-CM

## 2020-06-09 ENCOUNTER — Other Ambulatory Visit: Payer: Self-pay | Admitting: Family Medicine

## 2020-06-09 DIAGNOSIS — I10 Essential (primary) hypertension: Secondary | ICD-10-CM

## 2020-07-22 ENCOUNTER — Telehealth: Payer: Self-pay | Admitting: Family Medicine

## 2020-07-22 ENCOUNTER — Telehealth: Payer: Self-pay

## 2020-07-22 NOTE — Telephone Encounter (Signed)
Copied from [place] [phone]. Topic: General - Other >> Jul 09, 2020 11:40 AM Leward Quan A wrote: Reason for CRM: Patient called in to inform Dr Margarita Rana that she have a sinus infection say that she have to work and need an Rx sent to her pharmacy please c/o burning of the nose and itchy ears would like a call back with an update can leave a message if no answer. Ph# [phone]  Please advice if PCP can send in medication or will patient need an appt.

## 2020-07-22 NOTE — Telephone Encounter (Signed)
Does patient need an OV ? 

## 2020-07-22 NOTE — Telephone Encounter (Signed)
Copied from [place] [phone]. Topic: General - Call Back - No Documentation >> Jul 22, 2020 10:01 AM Loma [place] wrote: Requested Nurse to fu with her re ordering  an HIV, AIDS  test and any other STD test, please fu as husband has been promiscuous and devastated.[phone].

## 2020-07-22 NOTE — Telephone Encounter (Signed)
Pt will be coming in tomorrow for a nurse visit.

## 2020-07-23 ENCOUNTER — Ambulatory Visit: Payer: Medicaid Other

## 2020-07-23 MED ORDER — FLUTICASONE PROPIONATE 50 MCG/ACT NA SUSP: 2.0000 | Freq: Every day | NASAL | 1 refills | Status: DC

## 2020-07-23 MED ORDER — CETIRIZINE HCL 10 MG PO TABS: 10.0000 mg | ORAL_TABLET | Freq: Every day | ORAL | 1 refills | Status: DC

## 2020-07-23 NOTE — Telephone Encounter (Signed)
I have sent prescriptions for Flonase and Zyrtec to her pharmacy. ?

## 2020-07-23 NOTE — Addendum Note (Signed)
Addended by: Charlott Rakes on: 07/23/2020 05:24 PM   Modules accepted: Orders

## 2020-07-25 NOTE — Telephone Encounter (Signed)
Pt was called and informed of medication being sent to pharmacy via VM.

## 2020-08-12 ENCOUNTER — Ambulatory Visit: Payer: Medicaid Other | Admitting: Family Medicine

## 2020-09-01 ENCOUNTER — Other Ambulatory Visit: Payer: Self-pay | Admitting: Family Medicine

## 2020-09-01 DIAGNOSIS — J4520 Mild intermittent asthma, uncomplicated: Secondary | ICD-10-CM

## 2020-09-01 DIAGNOSIS — I10 Essential (primary) hypertension: Secondary | ICD-10-CM

## 2020-09-01 DIAGNOSIS — M5416 Radiculopathy, lumbar region: Secondary | ICD-10-CM

## 2020-09-01 NOTE — Telephone Encounter (Signed)
   Notes to clinic:  scripts last filled on 04/17/2020 Review for continued use and refill    Requested Prescriptions  Pending Prescriptions Disp Refills   VENTOLIN HFA 108 (90 Base) MCG/ACT inhaler [Pharmacy Med Name: Ventolin HFA 108 (90 Base) MCG/ACT Inhalation Aerosol Solution] 18 g 0    Sig: INHALE 2 PUFFS BY MOUTH EVERY 6 HOURS AS NEEDED FOR WHEEZING OR SHORTNESS OF BREATH      Pulmonology:  Beta Agonists Failed - 09/01/2020 11:58 AM      Failed - One inhaler should last at least one month. If the patient is requesting refills earlier, contact the patient to check for uncontrolled symptoms.      Passed - Valid encounter within last 12 months    Recent Outpatient Visits           3 months ago Annual physical exam   [place], [place]ong, MD   4 months ago Vaginal itching   [place] Ladell Pier, MD   5 months ago Exposure to COVID-19 virus   [place], Enobong, MD   1 year ago Refractive errors   [place] Charlott Rakes, MD   2 years ago Lumbar radiculopathy   [place], [place], MD                  gabapentin (NEURONTIN) 300 MG capsule [Pharmacy Med Name: Gabapentin 300 MG Oral Capsule] 120 capsule 0    Sig: Take 2 capsules by mouth twice daily      Neurology: Anticonvulsants - gabapentin Passed - 09/01/2020 11:58 AM      Passed - Valid encounter within last 12 months    Recent Outpatient Visits           3 months ago Annual physical exam   [place], Enobong, MD   4 months ago Vaginal itching   [place] Ladell Pier, MD   5 months ago Exposure to COVID-19 virus   [place], Enobong, MD   1 year ago Refractive errors   [place], Enobong, MD   2 years ago Lumbar radiculopathy   [place] [facility] Charlott Rakes, MD

## 2020-10-07 ENCOUNTER — Other Ambulatory Visit: Payer: Self-pay | Admitting: Family Medicine

## 2020-10-07 DIAGNOSIS — I10 Essential (primary) hypertension: Secondary | ICD-10-CM

## 2020-10-07 NOTE — Telephone Encounter (Signed)
  Notes to clinic:  Review for refill  Last appt was canceled and not rescheduled    Requested Prescriptions  Pending Prescriptions Disp Refills   amLODipine (NORVASC) 10 MG tablet [Pharmacy Med Name: amLODIPine Besylate 10 MG Oral Tablet] 30 tablet 0    Sig: Take 1 tablet by mouth once daily      Cardiovascular:  Calcium Channel Blockers Failed - 10/07/2020 10:29 AM      Failed - Last BP in normal range    BP Readings from Last 1 Encounters:  05/14/20 (!) 142/83          Passed - Valid encounter within last 6 months    Recent Outpatient Visits           4 months ago Annual physical exam   [place], Charlane Ferretti, MD   5 months ago Vaginal itching   [place] Ladell Pier, MD   6 months ago Exposure to COVID-19 virus   [place], Enobong, MD   1 year ago Refractive errors   [place], Enobong, MD   2 years ago Lumbar radiculopathy   [facility] Charlott Rakes, MD

## 2020-10-16 ENCOUNTER — Inpatient Hospital Stay
Admission: EM | Admit: 2020-10-16 | Discharge: 2020-10-18 | DRG: 565 | Disposition: A | Discharge status: Home / Self Care | Payer: Medicaid Other | Attending: Hospitalist | Admitting: Hospitalist

## 2020-10-16 ENCOUNTER — Encounter: Payer: Self-pay | Admitting: Emergency Medicine

## 2020-10-16 ENCOUNTER — Emergency Department: Payer: Medicaid Other

## 2020-10-16 ENCOUNTER — Other Ambulatory Visit: Payer: Self-pay

## 2020-10-16 DIAGNOSIS — I10 Essential (primary) hypertension: Secondary | ICD-10-CM | POA: Diagnosis present

## 2020-10-16 DIAGNOSIS — M5136 Other intervertebral disc degeneration, lumbar region: Secondary | ICD-10-CM | POA: Diagnosis present

## 2020-10-16 DIAGNOSIS — R55 Syncope and collapse: Secondary | ICD-10-CM | POA: Diagnosis not present

## 2020-10-16 DIAGNOSIS — K219 Gastro-esophageal reflux disease without esophagitis: Secondary | ICD-10-CM | POA: Diagnosis present

## 2020-10-16 DIAGNOSIS — R7303 Prediabetes: Secondary | ICD-10-CM | POA: Diagnosis present

## 2020-10-16 DIAGNOSIS — G8929 Other chronic pain: Secondary | ICD-10-CM | POA: Diagnosis present

## 2020-10-16 DIAGNOSIS — T796XXA Traumatic ischemia of muscle, initial encounter: Principal | ICD-10-CM | POA: Diagnosis present

## 2020-10-16 DIAGNOSIS — D72829 Elevated white blood cell count, unspecified: Secondary | ICD-10-CM | POA: Diagnosis present

## 2020-10-16 DIAGNOSIS — G43809 Other migraine, not intractable, without status migrainosus: Secondary | ICD-10-CM

## 2020-10-16 DIAGNOSIS — W182XXA Fall in (into) shower or empty bathtub, initial encounter: Secondary | ICD-10-CM | POA: Diagnosis present

## 2020-10-16 DIAGNOSIS — R400 Somnolence: Secondary | ICD-10-CM | POA: Diagnosis not present

## 2020-10-16 DIAGNOSIS — Z833 Family history of diabetes mellitus: Secondary | ICD-10-CM

## 2020-10-16 DIAGNOSIS — Z6841 Body Mass Index (BMI) 40.0 and over, adult: Secondary | ICD-10-CM

## 2020-10-16 DIAGNOSIS — R4781 Slurred speech: Secondary | ICD-10-CM | POA: Diagnosis not present

## 2020-10-16 DIAGNOSIS — Y92239 Unspecified place in hospital as the place of occurrence of the external cause: Secondary | ICD-10-CM | POA: Diagnosis not present

## 2020-10-16 DIAGNOSIS — F17210 Nicotine dependence, cigarettes, uncomplicated: Secondary | ICD-10-CM | POA: Diagnosis present

## 2020-10-16 DIAGNOSIS — Z7984 Long term (current) use of oral hypoglycemic drugs: Secondary | ICD-10-CM

## 2020-10-16 DIAGNOSIS — D573 Sickle-cell trait: Secondary | ICD-10-CM | POA: Diagnosis present

## 2020-10-16 DIAGNOSIS — J45909 Unspecified asthma, uncomplicated: Secondary | ICD-10-CM | POA: Diagnosis present

## 2020-10-16 DIAGNOSIS — Z90710 Acquired absence of both cervix and uterus: Secondary | ICD-10-CM

## 2020-10-16 DIAGNOSIS — Z8249 Family history of ischemic heart disease and other diseases of the circulatory system: Secondary | ICD-10-CM

## 2020-10-16 DIAGNOSIS — Z20822 Contact with and (suspected) exposure to covid-19: Secondary | ICD-10-CM | POA: Diagnosis present

## 2020-10-16 DIAGNOSIS — W19XXXA Unspecified fall, initial encounter: Principal | ICD-10-CM

## 2020-10-16 DIAGNOSIS — Z79899 Other long term (current) drug therapy: Secondary | ICD-10-CM

## 2020-10-16 DIAGNOSIS — R7989 Other specified abnormal findings of blood chemistry: Secondary | ICD-10-CM | POA: Diagnosis present

## 2020-10-16 DIAGNOSIS — T4275XA Adverse effect of unspecified antiepileptic and sedative-hypnotic drugs, initial encounter: Secondary | ICD-10-CM | POA: Diagnosis not present

## 2020-10-16 DIAGNOSIS — M6282 Rhabdomyolysis: Secondary | ICD-10-CM | POA: Diagnosis present

## 2020-10-16 DIAGNOSIS — Y92002 Bathroom of unspecified non-institutional (private) residence single-family (private) house as the place of occurrence of the external cause: Secondary | ICD-10-CM

## 2020-10-16 LAB — CBC WITH DIFFERENTIAL/PLATELET
Abs Immature Granulocytes: 0.06 10*3/uL (ref 0.00–0.07)
Basophils Absolute: 0 10*3/uL (ref 0.0–0.1)
Basophils Relative: 0 %
Eosinophils Absolute: 0.1 10*3/uL (ref 0.0–0.5)
Eosinophils Relative: 1 %
HCT: 48.1 % — ABNORMAL HIGH (ref 36.0–46.0)
Hemoglobin: 15.8 g/dL — ABNORMAL HIGH (ref 12.0–15.0)
Immature Granulocytes: 0 %
Lymphocytes Relative: 9 %
Lymphs Abs: 1.5 10*3/uL (ref 0.7–4.0)
MCH: 25.6 pg — ABNORMAL LOW (ref 26.0–34.0)
MCHC: 32.8 g/dL (ref 30.0–36.0)
MCV: 77.8 fL — ABNORMAL LOW (ref 80.0–100.0)
Monocytes Absolute: 0.6 10*3/uL (ref 0.1–1.0)
Monocytes Relative: 4 %
Neutro Abs: 15.2 10*3/uL — ABNORMAL HIGH (ref 1.7–7.7)
Neutrophils Relative %: 86 %
Platelets: 252 10*3/uL (ref 150–400)
RBC: 6.18 MIL/uL — ABNORMAL HIGH (ref 3.87–5.11)
RDW: 17.6 % — ABNORMAL HIGH (ref 11.5–15.5)
WBC: 17.6 10*3/uL — ABNORMAL HIGH (ref 4.0–10.5)
nRBC: 0 % (ref 0.0–0.2)

## 2020-10-16 LAB — COMPREHENSIVE METABOLIC PANEL
ALT: 67 U/L — ABNORMAL HIGH (ref 0–44)
AST: 149 U/L — ABNORMAL HIGH (ref 15–41)
Albumin: 3.5 g/dL (ref 3.5–5.0)
Alkaline Phosphatase: 76 U/L (ref 38–126)
Anion gap: 9 (ref 5–15)
BUN: 14 mg/dL (ref 6–20)
CO2: 24 mmol/L (ref 22–32)
Calcium: 8.8 mg/dL — ABNORMAL LOW (ref 8.9–10.3)
Chloride: 101 mmol/L (ref 98–111)
Creatinine, Ser: 0.85 mg/dL (ref 0.44–1.00)
GFR, Estimated: >60 mL/min (ref >60)
Glucose, Bld: 138 mg/dL — ABNORMAL HIGH (ref 70–99)
Potassium: 3.8 mmol/L (ref 3.5–5.1)
Sodium: 134 mmol/L — ABNORMAL LOW (ref 135–145)
Total Bilirubin: 0.9 mg/dL (ref 0.3–1.2)
Total Protein: 7.2 g/dL (ref 6.5–8.1)

## 2020-10-16 LAB — TSH: TSH: 0.73 u[IU]/mL (ref 0.350–4.500)

## 2020-10-16 LAB — RESP PANEL BY RT-PCR (FLU A&B, COVID) ARPGX2
Influenza A by PCR: NEGATIVE
Influenza B by PCR: NEGATIVE
SARS Coronavirus 2 by RT PCR: NEGATIVE

## 2020-10-16 LAB — CK: Total CK: 10037 U/L — ABNORMAL HIGH (ref 38–234)

## 2020-10-16 MED ORDER — ALBUTEROL SULFATE (2.5 MG/3ML) 0.083% IN NEBU: 2.5000 mg | INHALATION_SOLUTION | Freq: Four times a day (QID) | RESPIRATORY_TRACT | Status: DC | PRN

## 2020-10-16 MED ORDER — ONDANSETRON HCL 4 MG/2ML IJ SOLN: 4.0000 mg | Freq: Four times a day (QID) | INTRAMUSCULAR | Status: DC | PRN

## 2020-10-16 MED ORDER — METFORMIN HCL 500 MG PO TABS
500.0000 mg | ORAL_TABLET | Freq: Two times a day (BID) | ORAL | Status: DC
  Administered 2020-10-17: 500 mg via ORAL
  Filled 2020-10-16: qty 1

## 2020-10-16 MED ORDER — AMLODIPINE BESYLATE 10 MG PO TABS
10.0000 mg | ORAL_TABLET | Freq: Every day | ORAL | Status: DC
  Administered 2020-10-16 – 2020-10-18 (×2): 10 mg via ORAL
  Filled 2020-10-16: qty 2
  Filled 2020-10-16: qty 1
  Filled 2020-10-16: qty 2

## 2020-10-16 MED ORDER — QUETIAPINE FUMARATE 25 MG PO TABS
200.0000 mg | ORAL_TABLET | Freq: Every day | ORAL | Status: DC
  Administered 2020-10-16: 200 mg via ORAL
  Filled 2020-10-16: qty 8

## 2020-10-16 MED ORDER — ONDANSETRON HCL 4 MG PO TABS: 4.0000 mg | ORAL_TABLET | Freq: Four times a day (QID) | ORAL | Status: DC | PRN

## 2020-10-16 MED ORDER — ATORVASTATIN CALCIUM 20 MG PO TABS
20.0000 mg | ORAL_TABLET | Freq: Every day | ORAL | Status: DC
  Filled 2020-10-16: qty 1

## 2020-10-16 MED ORDER — GABAPENTIN 300 MG PO CAPS
600.0000 mg | ORAL_CAPSULE | Freq: Two times a day (BID) | ORAL | Status: DC
  Administered 2020-10-16 – 2020-10-18 (×3): 600 mg via ORAL
  Filled 2020-10-16 (×4): qty 2

## 2020-10-16 MED ORDER — POLYETHYLENE GLYCOL 3350 17 G PO PACK: 17.0000 g | PACK | Freq: Every day | ORAL | Status: DC | PRN

## 2020-10-16 MED ORDER — ENOXAPARIN SODIUM 40 MG/0.4ML IJ SOSY: 40.0000 mg | PREFILLED_SYRINGE | INTRAMUSCULAR | Status: DC

## 2020-10-16 MED ORDER — POTASSIUM CHLORIDE CRYS ER 20 MEQ PO TBCR
20.0000 meq | EXTENDED_RELEASE_TABLET | Freq: Every day | ORAL | Status: DC
  Administered 2020-10-16 – 2020-10-18 (×2): 20 meq via ORAL
  Filled 2020-10-16 (×3): qty 1

## 2020-10-16 MED ORDER — SODIUM CHLORIDE 0.9 % IV BOLUS: 1000.0000 mL | Freq: Once | INTRAVENOUS | Status: DC

## 2020-10-16 MED ORDER — MORPHINE SULFATE (PF) 2 MG/ML IV SOLN
2.0000 mg | INTRAVENOUS | Status: DC | PRN
  Administered 2020-10-16: 2 mg via INTRAVENOUS
  Filled 2020-10-16 (×2): qty 1

## 2020-10-16 MED ORDER — LORATADINE 10 MG PO TABS
10.0000 mg | ORAL_TABLET | Freq: Every day | ORAL | Status: DC
Start: 2020-10-17 — End: 2020-10-18
  Administered 2020-10-18: 10 mg via ORAL
  Filled 2020-10-16 (×2): qty 1

## 2020-10-16 MED ORDER — MOMETASONE FURO-FORMOTEROL FUM 200-5 MCG/ACT IN AERO
2.0000 | INHALATION_SPRAY | Freq: Two times a day (BID) | RESPIRATORY_TRACT | Status: DC
  Administered 2020-10-16 – 2020-10-18 (×3): 2 via RESPIRATORY_TRACT
  Filled 2020-10-16: qty 8.8

## 2020-10-16 MED ORDER — ACETAMINOPHEN 650 MG RE SUPP: 650.0000 mg | Freq: Four times a day (QID) | RECTAL | Status: DC | PRN

## 2020-10-16 MED ORDER — HYDROCODONE-ACETAMINOPHEN 5-325 MG PO TABS: 1.0000 | ORAL_TABLET | ORAL | Status: DC | PRN

## 2020-10-16 MED ORDER — ACETAMINOPHEN 325 MG PO TABS: 650.0000 mg | ORAL_TABLET | Freq: Four times a day (QID) | ORAL | Status: DC | PRN

## 2020-10-16 MED ORDER — SERTRALINE HCL 50 MG PO TABS
100.0000 mg | ORAL_TABLET | Freq: Every day | ORAL | Status: DC
  Administered 2020-10-18: 100 mg via ORAL
  Filled 2020-10-16 (×2): qty 2

## 2020-10-16 MED ORDER — SODIUM CHLORIDE 0.9 % IV SOLN: Freq: Once | INTRAVENOUS | Status: AC

## 2020-10-16 MED ORDER — TRAZODONE HCL 100 MG PO TABS
100.0000 mg | ORAL_TABLET | Freq: Every day | ORAL | Status: DC
  Administered 2020-10-16: 100 mg via ORAL
  Filled 2020-10-16: qty 1

## 2020-10-16 MED ORDER — FLUTICASONE PROPIONATE 50 MCG/ACT NA SUSP
2.0000 | Freq: Every day | NASAL | Status: DC | PRN
  Filled 2020-10-16: qty 16

## 2020-10-16 MED ORDER — TOPIRAMATE 25 MG PO TABS
50.0000 mg | ORAL_TABLET | Freq: Two times a day (BID) | ORAL | Status: DC
Start: 2020-10-16 — End: 2020-10-18
  Administered 2020-10-16 – 2020-10-18 (×3): 50 mg via ORAL
  Filled 2020-10-16 (×5): qty 2

## 2020-10-16 MED ORDER — SODIUM CHLORIDE 0.9 % IV SOLN: INTRAVENOUS | Status: DC

## 2020-10-16 MED ORDER — ALBUTEROL SULFATE HFA 108 (90 BASE) MCG/ACT IN AERS: 2.0000 | INHALATION_SPRAY | Freq: Four times a day (QID) | RESPIRATORY_TRACT | Status: DC | PRN

## 2020-10-16 MED ORDER — SODIUM CHLORIDE 0.9% FLUSH
3.0000 mL | Freq: Two times a day (BID) | INTRAVENOUS | Status: DC
  Administered 2020-10-16 – 2020-10-18 (×3): 3 mL via INTRAVENOUS

## 2020-10-16 MED ORDER — MONTELUKAST SODIUM 10 MG PO TABS
10.0000 mg | ORAL_TABLET | Freq: Every day | ORAL | Status: DC
  Administered 2020-10-16 – 2020-10-17 (×2): 10 mg via ORAL
  Filled 2020-10-16 (×3): qty 1

## 2020-10-16 MED ORDER — ENOXAPARIN SODIUM 60 MG/0.6ML IJ SOSY
0.5000 mg/kg | PREFILLED_SYRINGE | INTRAMUSCULAR | Status: DC
  Administered 2020-10-16 – 2020-10-17 (×2): 55 mg via SUBCUTANEOUS
  Filled 2020-10-16 (×2): qty 0.6

## 2020-10-16 NOTE — ED Provider Notes (Signed)
[facility] Emergency Department Provider Note   ____________________________________________   Event Date/Time   First MD Initiated Contact with Patient 10/16/20 1436     (approximate)  I have reviewed the triage vital signs and the nursing notes.   HISTORY  Chief Complaint Fall    HPI Kelly Byrd is a 45 y.o. female presents to the ED via EMS with multiple complaints.  Patient reports that yesterday while in the shower she fell and laid in bathtub for several hours.  Patient states that she crawled to her bed where she stayed.  Husband states that he left the home at 11 AM and that it is very possible that she spent several hours laying in the bathtub.  Patient denies any head injury or loss of consciousness but this was an unwitnessed fall.  Currently she is complaining of right hip pain, right shoulder pain, neck and rib pain.  Patient states that she took trazodone and gabapentin which is not helping with her pain.  No lacerations or abrasions were noted by family.         Past Medical History:  Diagnosis Date   Asthma    Back pain    Chronic kidney disease    frequent infrections   Depression    Dislocation of hip (HCC)    right hip   GERD (gastroesophageal reflux disease)    Hypertension    Migraines    Pre-diabetes    Sickle cell trait [facility])     Patient Active Problem List   Diagnosis Date Noted   S/P abdominal supracervical subtotal hysterectomy 10/23/2019   Abdominal adhesions    Abnormal uterine bleeding (AUB) 10/11/2019   Intramural leiomyoma of uterus 10/11/2019   Angioedema 06/26/2019   Facial swelling    Allergic reaction 06/25/2019   Hypokalemia 06/25/2019   Abdominal pain 06/25/2019   Major depressive disorder, 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/11/2017   Cocaine abuse ([place]) 10/11/2017   Lumbar radiculopathy 05/31/2017   Hyperlipidemia 05/31/2017   GERD (gastroesophageal reflux disease)  08/05/2016   Fibroids 09/19/2015   Hypertension 08/29/2015   Asthma 08/29/2015   Back pain 08/29/2015   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   Anemia, iron deficiency 10/29/2014   Menometrorrhagia 10/29/2014   Migraine 10/27/2014    Past Surgical History:  Procedure Laterality Date   CESAREAN SECTION     2   CYSTOSCOPY N/A 10/23/2019   Procedure: CYSTOSCOPY;  Surgeon: Malachy Mood, MD;  Location: [facility] ORS;  Service: Gynecology;  Laterality: N/A;   TOTAL LAPAROSCOPIC HYSTERECTOMY WITH SALPINGECTOMY Bilateral 10/23/2019   Procedure: TOTAL LAPAROSCOPIC HYSTERECTOMY CONVERTED TO OPEN SUPRACERVICAL HYSTERECTOMY;  Surgeon: Malachy Mood, MD;  Location: [facility] ORS;  Service: Gynecology;  Laterality: Bilateral;   TUBAL LIGATION     upper teeth      Prior to Admission medications   Medication Sig Start Date End Date Taking? Authorizing Provider  amLODipine (NORVASC) 10 MG tablet Take 1 tablet by mouth once daily 09/01/20   Charlott Rakes, MD  atorvastatin (LIPITOR) 20 MG tablet Take 1 tablet (20 mg total) by mouth daily. 03/19/19   Charlott Rakes, MD  baclofen (LIORESAL) 10 MG tablet Take 10 mg by mouth in the morning and at bedtime.  07/16/19   [provider]  celecoxib (CELEBREX) 100 MG capsule Take 100 mg by mouth daily. 07/16/19   [provider]  cephALEXin (KEFLEX) 500 MG capsule Take 1 capsule (500 mg total) by mouth 2 (two) times  daily. 05/14/20   Charlott Rakes, MD  cetirizine (ZYRTEC) 10 MG tablet Take 1 tablet (10 mg total) by mouth daily. 07/23/20   Charlott Rakes, MD  diclofenac Sodium (VOLTAREN) 1 % GEL Apply 1 application topically 4 (four) times daily as needed (pain.).  07/16/19   [provider]  diphenhydrAMINE (BENADRYL) 50 MG tablet Take 1 tablet (50 mg total) by mouth every 8 (eight) hours as needed for allergies. Patient taking differently: Take 50 mg by mouth every evening. 06/27/19   Patrecia Pour, MD  EPINEPHrine 0.3 mg/0.3 mL  IJ SOAJ injection Inject 0.3 mLs (0.3 mg total) into the muscle as needed for anaphylaxis. 07/23/19   Fritzi Mandes, MD  famotidine (PEPCID) 20 MG tablet Take 1 tablet (20 mg total) by mouth 2 (two) times daily. 06/27/19 10/11/20  Patrecia Pour, MD  fluticasone (FLONASE) 50 MCG/ACT nasal spray Place 2 sprays into both nostrils daily. 07/23/20   Charlott Rakes, MD  gabapentin (NEURONTIN) 300 MG capsule Take 2 capsules by mouth twice daily 09/03/20   Charlott Rakes, MD  metFORMIN (GLUCOPHAGE) 500 MG tablet Take 1 tablet (500 mg total) by mouth 2 (two) times daily with a meal. 05/14/20   Charlott Rakes, MD  metroNIDAZOLE (FLAGYL) 500 MG tablet Take 1 tablet (500 mg total) by mouth 2 (two) times daily. Patient not taking: Reported on 05/14/2020 04/28/20   Ladell Pier, MD  mometasone-formoterol [facility]) 200-5 MCG/ACT AERO Inhale 2 puffs into the lungs in the morning and at bedtime. 04/03/20   Charlott Rakes, MD  montelukast (SINGULAIR) 10 MG tablet Take 1 tablet (10 mg total) by mouth at bedtime. 03/19/19   Charlott Rakes, MD  ondansetron (ZOFRAN ODT) 4 MG disintegrating tablet Take 1 tablet (4 mg total) by mouth every 8 (eight) hours as needed for nausea or vomiting. 12/18/18   Harvest Dark, MD  oxyCODONE-acetaminophen (PERCOCET) 7.5-325 MG tablet Take 1 tablet by mouth every 8 (eight) hours as needed (pain.).  07/16/19   [provider]  potassium chloride 20 MEQ TBCR Take 20 mEq by mouth daily. 04/29/20   Charlott Rakes, MD  QUEtiapine (SEROQUEL) 200 MG tablet Take 200 mg by mouth at bedtime. 10/08/19   [provider]  sertraline (ZOLOFT) 100 MG tablet Take 100 mg by mouth daily. 10/08/19   [provider]  SUMAtriptan (IMITREX) 100 MG tablet 100 mg orally at the onset of a migraine, may repeat in 2 hours if headache persists or recurs. Max 200mg  in 24 hrs 04/03/20   Charlott Rakes, MD  topiramate (TOPAMAX) 50 MG tablet Take 2 tablets (100 mg total) by mouth 2 (two) times  daily. Patient taking differently: Take 50 mg by mouth 2 (two) times daily. 03/19/19   Charlott Rakes, MD  traZODone (DESYREL) 100 MG tablet Take 1 tablet (100 mg total) by mouth at bedtime. 10/14/17   Starkes-Perry, Gayland Curry, FNP  VENTOLIN HFA 108 (90 Base) MCG/ACT inhaler INHALE 2 PUFFS BY MOUTH EVERY 6 HOURS AS NEEDED FOR WHEEZING OR SHORTNESS OF BREATH 09/02/20   Charlott Rakes, MD  Vitamin D, Ergocalciferol, (DRISDOL) 1.25 MG (50000 UNIT) CAPS capsule Take 50,000 Units by mouth every Monday.  07/16/19   [provider]    Allergies Ace inhibitors, Lisinopril, and Peanuts [peanut oil]  Family History  Problem Relation Age of Onset   Hypertension Mother    Hypertension Father    Diabetes Mellitus II Maternal Grandmother    Lupus Other     Social History Social  History   Tobacco Use   Smoking status: Every Day    Packs/day: 0.25    Types: Cigarettes   Smokeless tobacco: Never  Vaping Use   Vaping Use: Never used  Substance Use Topics   Alcohol use: Yes    Alcohol/week: 1.0 standard drink    Types: 1 Cans of beer per week   Drug use: Yes    Types: Cocaine    Comment: + cocaine 09/2017   Denies use on 7/21 2021    Review of Systems Constitutional: No fever/chills Eyes: No visual changes. ENT: No sore throat. Cardiovascular: Denies chest pain. Respiratory: Denies shortness of breath.  Positive bilateral rib pain. Gastrointestinal: No abdominal pain.  No nausea, no vomiting.   Genitourinary: Negative for dysuria. Musculoskeletal: Positive cervical pain.  Positive right shoulder pain.  Positive right hip pain. Skin: Negative for rash. Neurological: Negative for headaches, focal weakness or numbness.   ____________________________________________   PHYSICAL EXAM:  VITAL SIGNS: ED Triage Vitals [10/16/20 1435]  Enc Vitals Group     BP 127/78     Pulse Rate 96     Resp 20     Temp 98 F (36.7 C)     Temp Source Oral     SpO2 99 %     Weight 246 lb  (111.6 kg)     Height 5\' 4"  (1.626 m)     Head Circumference      Peak Flow      Pain Score 10     Pain Loc      Pain Edu?      Excl. in [place]?     Constitutional: Alert and oriented. Well appearing and in no acute distress. Eyes: Conjunctivae are normal. PERRL. EOMI. Head: Atraumatic. Nose: No  trauma noted. Mouth/Throat: No trauma noted. Neck: No stridor.  Diffuse tenderness is noted on palpation of cervical spine posteriorly.  No soft tissue edema or discoloration is noted. Cardiovascular: Normal rate, regular rhythm. Grossly normal heart sounds.  Good peripheral circulation. Respiratory: Normal respiratory effort.  No retractions. Lungs CTAB.  On palpation of the ribs bilaterally there is tenderness but no ecchymosis or soft tissue edema present. Gastrointestinal: Soft and nontender. No distention.  Bowel sounds normoactive x4 quadrants. Musculoskeletal: On examination of the right shoulder there is no gross deformity or discoloration however patient is guarding against any range of motion due to increased pain.  She is moderately tender to palpation right lateral hip however on exam of the posterior hip there are no bruises or abrasions noted.  No tenderness is noted on palpation of the tib-fib bilaterally.  No rotation or shortening was noted during exam.  Skin is intact and without evidence of injury or soft tissue edema. Neurologic:  Normal speech and language. No gross focal neurologic deficits are appreciated.  Skin:  Skin is warm, dry and intact.  No abrasions or discoloration noted. Psychiatric: Mood and affect are normal. Speech and behavior are normal.  ____________________________________________   LABS (all labs ordered are listed, but only abnormal results are displayed)  Labs Reviewed  CBC WITH DIFFERENTIAL/PLATELET  COMPREHENSIVE METABOLIC PANEL  URINALYSIS, COMPLETE (UACMP) WITH MICROSCOPIC  CK    ____________________________________________  EKG   ____________________________________________  RADIOLOGY Leana Gamer, personally viewed and evaluated these images (plain radiographs) as part of my medical decision making, as well as reviewing the written report by the radiologist.    Official radiology report(s): No results found.  ____________________________________________   PROCEDURES  Procedure(s)  performed (including Critical Care):  Procedures   ____________________________________________   INITIAL IMPRESSION / ASSESSMENT AND PLAN / ED COURSE  As part of my medical decision making, I reviewed the following data within the electronic MEDICAL RECORD NUMBER Notes from prior ED visits and [place] Controlled Substance Database  ----------------------------------------- 3:53 PM on 10/16/2020 ----------------------------------------- At this time patient is being transferred to the care of [facility], PA-C.  Labs and x-rays are pending at this time.  Patient is resting comfortably on her stretcher with the lights out.  ____________________________________________   FINAL CLINICAL IMPRESSION(S) / ED DIAGNOSES  Final diagnoses:  Fall, initial encounter     ED Discharge Orders     None        Note:  This document was prepared using Dragon voice recognition software and may include unintentional dictation errors.    Johnn Hai, PA-C 10/16/20 1555    Lavonia Drafts, MD 10/16/20 [phone]

## 2020-10-16 NOTE — ED Notes (Signed)
Hospitalist is in room at this time.

## 2020-10-16 NOTE — ED Notes (Signed)
IV team is in room at this time.

## 2020-10-16 NOTE — ED Triage Notes (Signed)
Pt reports that she took a shower, and was getting out and fell. She laid in the shower for several hours yesterday, she crawled to her bed and states that is where she stayed. Family on scene reports that she got up last night and got a drink. She was ambulatory at scene. She is complaining of right butt cheek, right shoulder, neck and both sides of her abd. No deformity or bruising seen

## 2020-10-16 NOTE — ED Provider Notes (Signed)
  Physical Exam  BP 127/78 (BP Location: Right Arm)   Pulse 96   Temp 98 F (36.7 C) (Oral)   Resp 20   Ht 5\' 4"  (1.626 m)   Wt 111.6 kg   SpO2 99%   BMI 42.23 kg/m   Physical Exam  ED Course/Procedures     Procedures  MDM  Assumed patient care from Letitia Neri, PA-C.  Patient's CK was greater than 10,000.  Will admit to the hospitalist service for [place]. Patient was given bolus of normal saline and started on maintenance fluids. Screen Covid 19 test is in process at this time.        Vallarie Mare [place], PA-C 10/16/20 1757    Blake Divine, MD 10/16/20 2141

## 2020-10-16 NOTE — H&P (Addendum)
History and Physical    Kelly Byrd GMW:[id number] DOB: [birth year] DOA: 10/16/2020  PCP: Charlott Rakes, MD  Chief Complaint: Unwitnessed fall, possible syncope  HPI: Kelly Byrd is a 45 y.o. female with a past medical history of hypertension, asthma, migraines, chronic anemia, chronic low back pain, lumbar degenerative disc disease, slipped capital femoral epiphysis.  The patient presented to the emergency department after having a unwitnessed mechanical fall in the bathtub yesterday.  Approximately down for 4 to 6 hours.  No previous history of seizures.  Does not remember what happened.  Just states that she blacked out.  No prior history of the same.  In the emergency department her CPK is elevated at 10,000.  Elevated LFTs as well.  White blood cell count is elevated with no clear source of infection.  Vital signs are stable.  She has been given normal saline 1 L bolus and will be subsequently started on maintenance IV fluids.  CT imaging and plain film imaging unremarkable for any acute findings in the emergency department    ED Course: CT imaging and plain film imaging unremarkable for any acute findings in the emergency department.  CPK significantly elevated.  Given intravenous fluids.  Review of Systems: 14 point review of systems is negative except for what is mentioned above in the HPI.   Past Medical History:  Diagnosis Date   Asthma    Back pain    Chronic kidney disease    frequent infrections   Depression    Dislocation of hip (HCC)    right hip   GERD (gastroesophageal reflux disease)    Hypertension    Migraines    Pre-diabetes    Sickle cell trait ([place])     Past Surgical History:  Procedure Laterality Date   CESAREAN SECTION     2   CYSTOSCOPY N/A 10/23/2019   Procedure: CYSTOSCOPY;  Surgeon: Malachy Mood, MD;  Location: [facility] ORS;  Service: Gynecology;  Laterality: N/A;   TOTAL LAPAROSCOPIC HYSTERECTOMY WITH SALPINGECTOMY  Bilateral 10/23/2019   Procedure: TOTAL LAPAROSCOPIC HYSTERECTOMY CONVERTED TO OPEN SUPRACERVICAL HYSTERECTOMY;  Surgeon: Malachy Mood, MD;  Location: [facility] ORS;  Service: Gynecology;  Laterality: Bilateral;   TUBAL LIGATION     upper teeth      Social History   Socioeconomic History   Marital status: Married    Spouse name: Not on file   Number of children: Not on file   Years of education: Not on file   Highest education level: Not on file  Occupational History   Occupation: disabled  Tobacco Use   Smoking status: Every Day    Packs/day: 0.25    Types: Cigarettes   Smokeless tobacco: Never  Vaping Use   Vaping Use: Never used  Substance and Sexual Activity   Alcohol use: Yes    Alcohol/week: 1.0 standard drink    Types: 1 Cans of beer per week   Drug use: Yes    Types: Cocaine    Comment: + cocaine 09/2017   Denies use on 7/21 2021   Sexual activity: Yes    Birth control/protection: Surgical  Other Topics Concern   Not on file  Social History Narrative   Not on file   Social Determinants of Health   Financial Resource Strain: Not on file  Food Insecurity: Not on file  Transportation Needs: Not on file  Physical Activity: Not on file  Stress: Not on file  Social Connections: Not on file  Intimate Partner Violence:  Not on file    Allergies  Allergen Reactions   Ace Inhibitors Swelling    Throat and face   Lisinopril Other (See Comments)    Swelling--caused the pt to stay in the hospital   Peanuts [Peanut Oil] Other (See Comments)    Swelling--caused the pt to stay in the hospital    Family History  Problem Relation Age of Onset   Hypertension Mother    Hypertension Father    Diabetes Mellitus II Maternal Grandmother    Lupus Other     Prior to Admission medications   Medication Sig Start Date End Date Taking? Authorizing Provider  amLODipine (NORVASC) 10 MG tablet Take 1 tablet by mouth once daily 09/01/20   Charlott Rakes, MD  atorvastatin  (LIPITOR) 20 MG tablet Take 1 tablet (20 mg total) by mouth daily. 03/19/19   Charlott Rakes, MD  baclofen (LIORESAL) 10 MG tablet Take 10 mg by mouth in the morning and at bedtime.  07/16/19   [provider]  celecoxib (CELEBREX) 100 MG capsule Take 100 mg by mouth daily. 07/16/19   [provider]  cephALEXin (KEFLEX) 500 MG capsule Take 1 capsule (500 mg total) by mouth 2 (two) times daily. 05/14/20   Charlott Rakes, MD  cetirizine (ZYRTEC) 10 MG tablet Take 1 tablet (10 mg total) by mouth daily. 07/23/20   Charlott Rakes, MD  diclofenac Sodium (VOLTAREN) 1 % GEL Apply 1 application topically 4 (four) times daily as needed (pain.).  07/16/19   [provider]  diphenhydrAMINE (BENADRYL) 50 MG tablet Take 1 tablet (50 mg total) by mouth every 8 (eight) hours as needed for allergies. Patient taking differently: Take 50 mg by mouth every evening. 06/27/19   Patrecia Pour, MD  EPINEPHrine 0.3 mg/0.3 mL IJ SOAJ injection Inject 0.3 mLs (0.3 mg total) into the muscle as needed for anaphylaxis. 07/23/19   Fritzi Mandes, MD  famotidine (PEPCID) 20 MG tablet Take 1 tablet (20 mg total) by mouth 2 (two) times daily. 06/27/19 10/11/20  Patrecia Pour, MD  fluticasone (FLONASE) 50 MCG/ACT nasal spray Place 2 sprays into both nostrils daily. 07/23/20   Charlott Rakes, MD  gabapentin (NEURONTIN) 300 MG capsule Take 2 capsules by mouth twice daily 09/03/20   Charlott Rakes, MD  metFORMIN (GLUCOPHAGE) 500 MG tablet Take 1 tablet (500 mg total) by mouth 2 (two) times daily with a meal. 05/14/20   Charlott Rakes, MD  metroNIDAZOLE (FLAGYL) 500 MG tablet Take 1 tablet (500 mg total) by mouth 2 (two) times daily. Patient not taking: Reported on 05/14/2020 04/28/20   Ladell Pier, MD  mometasone-formoterol [facility]) 200-5 MCG/ACT AERO Inhale 2 puffs into the lungs in the morning and at bedtime. 04/03/20   Charlott Rakes, MD  montelukast (SINGULAIR) 10 MG tablet Take 1 tablet (10 mg total) by mouth  at bedtime. 03/19/19   Charlott Rakes, MD  ondansetron (ZOFRAN ODT) 4 MG disintegrating tablet Take 1 tablet (4 mg total) by mouth every 8 (eight) hours as needed for nausea or vomiting. 12/18/18   Harvest Dark, MD  oxyCODONE-acetaminophen (PERCOCET) 7.5-325 MG tablet Take 1 tablet by mouth every 8 (eight) hours as needed (pain.).  07/16/19   [provider]  potassium chloride 20 MEQ TBCR Take 20 mEq by mouth daily. 04/29/20   Charlott Rakes, MD  QUEtiapine (SEROQUEL) 200 MG tablet Take 200 mg by mouth at bedtime. 10/08/19   [provider]  sertraline (ZOLOFT) 100 MG tablet Take 100 mg  by mouth daily. 10/08/19   [provider]  SUMAtriptan (IMITREX) 100 MG tablet 100 mg orally at the onset of a migraine, may repeat in 2 hours if headache persists or recurs. Max 200mg  in 24 hrs 04/03/20   Charlott Rakes, MD  topiramate (TOPAMAX) 50 MG tablet Take 2 tablets (100 mg total) by mouth 2 (two) times daily. Patient taking differently: Take 50 mg by mouth 2 (two) times daily. 03/19/19   Charlott Rakes, MD  traZODone (DESYREL) 100 MG tablet Take 1 tablet (100 mg total) by mouth at bedtime. 10/14/17   Starkes-Perry, Gayland Curry, FNP  VENTOLIN HFA 108 (90 Base) MCG/ACT inhaler INHALE 2 PUFFS BY MOUTH EVERY 6 HOURS AS NEEDED FOR WHEEZING OR SHORTNESS OF BREATH 09/02/20   Charlott Rakes, MD  Vitamin D, Ergocalciferol, (DRISDOL) 1.25 MG (50000 UNIT) CAPS capsule Take 50,000 Units by mouth every Monday.  07/16/19   [provider]    Physical Exam: Vitals:   10/16/20 1435  BP: 127/78  Pulse: 96  Resp: 20  Temp: 98 F (36.7 C)  TempSrc: Oral  SpO2: 99%  Weight: 111.6 kg  Height: 5\' 4"  (1.626 m)     General:  Appears calm and comfortable and is in NAD Cardiovascular:  RRR, no m/r/g.  Respiratory:   CTA bilaterally with no wheezes/rales/rhonchi.  Normal respiratory effort. Abdomen:  soft, NT, ND, NABS Skin:  no rash or induration seen on limited exam Musculoskeletal:   grossly normal tone BUE/BLE, good ROM, no bony abnormality Lower extremity:  No LE edema.  Limited foot exam with no ulcerations.  2+ distal pulses. Psychiatric:  grossly normal mood and affect, speech fluent and appropriate, AOx3 Neurologic:  CN 2-12 grossly intact, moves all extremities in coordinated fashion, sensation intact    Radiological Exams on Admission: Independently reviewed - see discussion in A/P where applicable  DG Chest 1 View  Result Date: 10/16/2020 CLINICAL DATA:  Fall in shower with chest pain, initial encounter EXAM: CHEST  1 VIEW COMPARISON:  04/20/2020 FINDINGS: The heart size and mediastinal contours are within normal limits. Both lungs are clear. The visualized skeletal structures are unremarkable. IMPRESSION: No active disease. Electronically Signed   By: Inez Catalina M.D.   On: 10/16/2020 16:03   DG Shoulder Right  Result Date: 10/16/2020 CLINICAL DATA:  Recent fall in the shower with right shoulder pain, initial encounter EXAM: RIGHT SHOULDER - 2+ VIEW COMPARISON:  09/30/2017 FINDINGS: Degenerative changes of the acromioclavicular joint are seen. Humeral head is well seated. No acute fracture or dislocation is noted. Underlying bony thorax is unremarkable. IMPRESSION: Degenerative change without acute abnormality. Electronically Signed   By: Inez Catalina M.D.   On: 10/16/2020 16:05   CT Head Wo Contrast  Result Date: 10/16/2020 CLINICAL DATA:  Unwitnessed fall in the bathtub. EXAM: CT HEAD WITHOUT CONTRAST CT CERVICAL SPINE WITHOUT CONTRAST TECHNIQUE: Multidetector CT imaging of the head and cervical spine was performed following the standard protocol without intravenous contrast. Multiplanar CT image reconstructions of the cervical spine were also generated. COMPARISON:  Head CT 10/26/2014 FINDINGS: CT HEAD FINDINGS Brain: The brain shows a normal appearance without evidence of malformation, atrophy, old or acute small or large vessel infarction, mass lesion,  hemorrhage, hydrocephalus or extra-axial collection. Vascular: No hyperdense vessel. No evidence of atherosclerotic calcification. Skull: Normal.  No traumatic finding.  No focal bone lesion. Sinuses/Orbits: Sinuses are clear. Orbits appear normal. Mastoids are clear. Other: None significant CT CERVICAL SPINE FINDINGS Alignment: No traumatic malalignment.  Mild forward bending of the neck. Skull base and vertebrae: No regional fracture. Soft tissues and spinal canal: No evidence of soft tissue injury. Disc levels: No disc level pathology is seen. Facets on the right are normal. On the left, there is osteoarthritis of the facets at C6-7, C7-T1 and T1-2 with mild left foraminal encroachment. Upper chest: Negative Other: None IMPRESSION: Head CT: Normal Cervical spine CT: No acute or traumatic finding. 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 Electronically Signed   By: Nelson Chimes M.D.   On: 10/16/2020 15:52   CT Cervical Spine Wo Contrast  Result Date: 10/16/2020 CLINICAL DATA:  Unwitnessed fall in the bathtub. EXAM: CT HEAD WITHOUT CONTRAST CT CERVICAL SPINE WITHOUT CONTRAST TECHNIQUE: Multidetector CT imaging of the head and cervical spine was performed following the standard protocol without intravenous contrast. Multiplanar CT image reconstructions of the cervical spine were also generated. COMPARISON:  Head CT 10/26/2014 FINDINGS: CT HEAD FINDINGS Brain: The brain shows a normal appearance without evidence of malformation, atrophy, old or acute small or large vessel infarction, mass lesion, hemorrhage, hydrocephalus or extra-axial collection. Vascular: No hyperdense vessel. No evidence of atherosclerotic calcification. Skull: Normal.  No traumatic finding.  No focal bone lesion. Sinuses/Orbits: Sinuses are clear. Orbits appear normal. Mastoids are clear. Other: None significant CT CERVICAL SPINE FINDINGS Alignment: No traumatic malalignment. Mild forward bending of the neck. Skull base and  vertebrae: No regional fracture. Soft tissues and spinal canal: No evidence of soft tissue injury. Disc levels: No disc level pathology is seen. Facets on the right are normal. On the left, there is osteoarthritis of the facets at C6-7, C7-T1 and T1-2 with mild left foraminal encroachment. Upper chest: Negative Other: None IMPRESSION: Head CT: Normal Cervical spine CT: No acute or traumatic finding. 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 Electronically Signed   By: Nelson Chimes M.D.   On: 10/16/2020 15:52   DG Hip Unilat W or Wo Pelvis 2-3 Views Right  Result Date: 10/16/2020 CLINICAL DATA:  Recent fall in shower with right hip pain, initial encounter EXAM: DG HIP (WITH OR WITHOUT PELVIS) 3V RIGHT COMPARISON:  10/28/2014 FINDINGS: Remodeling of the right femoral head is noted similar to that seen in 2016 shown on prior MRI to represent healed slipped capital femoral epiphysis. Degenerative changes of the right hip joint is noted. No soft tissue abnormality is seen. Pelvic ring is intact. IMPRESSION: Chronic changes in the right hip as described. These are stable from previous exam in 2016. Electronically Signed   By: Inez Catalina M.D.   On: 10/16/2020 16:04     Labs on Admission: I have personally reviewed the available labs and imaging studies at the time of the admission.  Pertinent labs: CPK 10,000, AST 149, ALT 67, WBC 17, sodium 134     Assessment/Plan: Acute rhabdomyolysis: The patient will be admitted to the medical/surgical floor under observation status.  Trend the CPK.  No acute renal failure.  Given 1 L of normal saline emergency department.  We will continue with normal saline 125 cc an hour.  Syncope and collapse: Obtain orthostatic vital signs.  Obtain carotid duplex ultrasound and echocardiogram.  Continue patient on telemetry to monitor for any arrhythmia.  Leukocytosis likely reactive: No clear source of infection.  Urinalysis is pending.  Elevated LFTs: Obtain  hepatitis serologies.  Prediabates: Continue home metformin.  Obtain A1c.  Morbid obesity: BMI 42.  Counseled on lifestyle modifications.  Level of Care: MedSurg DVT prophylaxis:  Lovenox subcu Code Status: Full Consults: None Admission status: Observation   Leslee Home DO [facility]   How to contact the [facility] Attending or Consulting provider [place] or covering provider during after hours [place], for this patient?  Check the care team in [facility] and look for a) attending/consulting [facility] provider listed and b) the [facility] team listed Log into [email or web address] and use [place]'s universal password to access. If you do not have the password, please contact the hospital operator. Locate the [facility] provider you are looking for under [facility] and page to a number that you can be directly reached. If you still have difficulty reaching the provider, please page the [facility] (Director on Call) for the Hospitalists listed on amion for assistance.   10/16/2020, 6:50 PM

## 2020-10-16 NOTE — Progress Notes (Signed)
PHARMACIST - PHYSICIAN COMMUNICATION  CONCERNING:  Enoxaparin (Lovenox) for DVT Prophylaxis    RECOMMENDATION: Patient was prescribed enoxaprin 40mg  q24 hours for VTE prophylaxis.   Filed Weights   10/16/20 1435  Weight: 111.6 kg (246 lb)    Body mass index is 42.23 kg/m.  Estimated Creatinine Clearance: 104.4 mL/min (by C-G formula based on SCr of 0.85 mg/dL).   Based on [place] patient is candidate for enoxaparin 0.5mg /kg TBW SQ every 24 hours based on BMI being >30.  DESCRIPTION: Pharmacy has adjusted enoxaparin dose per [facility] policy.  Patient is now receiving enoxaparin 55 mg every 24 hours    Berta Minor, PharmD Clinical Pharmacist  10/16/2020 6:54 PM

## 2020-10-17 ENCOUNTER — Observation Stay: Admit: 2020-10-17 | Payer: Medicaid Other

## 2020-10-17 ENCOUNTER — Observation Stay: Payer: Medicaid Other

## 2020-10-17 ENCOUNTER — Encounter: Payer: Self-pay | Admitting: Family Medicine

## 2020-10-17 ENCOUNTER — Inpatient Hospital Stay: Payer: Medicaid Other

## 2020-10-17 ENCOUNTER — Observation Stay (HOSPITAL_COMMUNITY)
Admit: 2020-10-17 | Discharge: 2020-10-17 | Disposition: A | Discharge status: Home / Self Care | Payer: Medicaid Other | Attending: Family Medicine | Admitting: Family Medicine

## 2020-10-17 DIAGNOSIS — Z90710 Acquired absence of both cervix and uterus: Secondary | ICD-10-CM | POA: Diagnosis not present

## 2020-10-17 DIAGNOSIS — W182XXA Fall in (into) shower or empty bathtub, initial encounter: Secondary | ICD-10-CM | POA: Diagnosis present

## 2020-10-17 DIAGNOSIS — Z833 Family history of diabetes mellitus: Secondary | ICD-10-CM | POA: Diagnosis not present

## 2020-10-17 DIAGNOSIS — Z6841 Body Mass Index (BMI) 40.0 and over, adult: Secondary | ICD-10-CM | POA: Diagnosis not present

## 2020-10-17 DIAGNOSIS — R7989 Other specified abnormal findings of blood chemistry: Secondary | ICD-10-CM | POA: Diagnosis present

## 2020-10-17 DIAGNOSIS — Y92239 Unspecified place in hospital as the place of occurrence of the external cause: Secondary | ICD-10-CM | POA: Diagnosis not present

## 2020-10-17 DIAGNOSIS — F17210 Nicotine dependence, cigarettes, uncomplicated: Secondary | ICD-10-CM | POA: Diagnosis present

## 2020-10-17 DIAGNOSIS — Y92002 Bathroom of unspecified non-institutional (private) residence single-family (private) house as the place of occurrence of the external cause: Secondary | ICD-10-CM | POA: Diagnosis not present

## 2020-10-17 DIAGNOSIS — D573 Sickle-cell trait: Secondary | ICD-10-CM | POA: Diagnosis present

## 2020-10-17 DIAGNOSIS — R7303 Prediabetes: Secondary | ICD-10-CM | POA: Diagnosis present

## 2020-10-17 DIAGNOSIS — I10 Essential (primary) hypertension: Secondary | ICD-10-CM | POA: Diagnosis present

## 2020-10-17 DIAGNOSIS — R4781 Slurred speech: Secondary | ICD-10-CM | POA: Diagnosis not present

## 2020-10-17 DIAGNOSIS — Z7984 Long term (current) use of oral hypoglycemic drugs: Secondary | ICD-10-CM | POA: Diagnosis not present

## 2020-10-17 DIAGNOSIS — T796XXA Traumatic ischemia of muscle, initial encounter: Principal | ICD-10-CM

## 2020-10-17 DIAGNOSIS — G8929 Other chronic pain: Secondary | ICD-10-CM | POA: Diagnosis present

## 2020-10-17 DIAGNOSIS — R55 Syncope and collapse: Secondary | ICD-10-CM | POA: Diagnosis not present

## 2020-10-17 DIAGNOSIS — Z79899 Other long term (current) drug therapy: Secondary | ICD-10-CM | POA: Diagnosis not present

## 2020-10-17 DIAGNOSIS — T4275XA Adverse effect of unspecified antiepileptic and sedative-hypnotic drugs, initial encounter: Secondary | ICD-10-CM | POA: Diagnosis not present

## 2020-10-17 DIAGNOSIS — D72829 Elevated white blood cell count, unspecified: Secondary | ICD-10-CM | POA: Diagnosis present

## 2020-10-17 DIAGNOSIS — J45909 Unspecified asthma, uncomplicated: Secondary | ICD-10-CM | POA: Diagnosis present

## 2020-10-17 DIAGNOSIS — Z20822 Contact with and (suspected) exposure to covid-19: Secondary | ICD-10-CM | POA: Diagnosis present

## 2020-10-17 DIAGNOSIS — M5136 Other intervertebral disc degeneration, lumbar region: Secondary | ICD-10-CM | POA: Diagnosis present

## 2020-10-17 DIAGNOSIS — R400 Somnolence: Secondary | ICD-10-CM | POA: Diagnosis not present

## 2020-10-17 DIAGNOSIS — K219 Gastro-esophageal reflux disease without esophagitis: Secondary | ICD-10-CM | POA: Diagnosis present

## 2020-10-17 DIAGNOSIS — Z8249 Family history of ischemic heart disease and other diseases of the circulatory system: Secondary | ICD-10-CM | POA: Diagnosis not present

## 2020-10-17 LAB — CBC
HCT: 44.1 % (ref 36.0–46.0)
HCT: 42.8 % (ref 36.0–46.0)
Hemoglobin: 14.4 g/dL (ref 12.0–15.0)
Hemoglobin: 14.6 g/dL (ref 12.0–15.0)
MCH: 25.3 pg — ABNORMAL LOW (ref 26.0–34.0)
MCH: 26.2 pg (ref 26.0–34.0)
MCHC: 32.7 g/dL (ref 30.0–36.0)
MCHC: 34.1 g/dL (ref 30.0–36.0)
MCV: 77.4 fL — ABNORMAL LOW (ref 80.0–100.0)
MCV: 76.7 fL — ABNORMAL LOW (ref 80.0–100.0)
Platelets: 294 10*3/uL (ref 150–400)
Platelets: 285 10*3/uL (ref 150–400)
RBC: 5.7 MIL/uL — ABNORMAL HIGH (ref 3.87–5.11)
RBC: 5.58 MIL/uL — ABNORMAL HIGH (ref 3.87–5.11)
RDW: 16.7 % — ABNORMAL HIGH (ref 11.5–15.5)
RDW: 16.5 % — ABNORMAL HIGH (ref 11.5–15.5)
WBC: 12.2 10*3/uL — ABNORMAL HIGH (ref 4.0–10.5)
WBC: 12.5 10*3/uL — ABNORMAL HIGH (ref 4.0–10.5)
nRBC: 0 % (ref 0.0–0.2)
nRBC: 0 % (ref 0.0–0.2)

## 2020-10-17 LAB — BLOOD GAS, ARTERIAL
Acid-Base Excess: 4.5 mmol/L — ABNORMAL HIGH (ref 0.0–2.0)
Bicarbonate: 28.4 mmol/L — ABNORMAL HIGH (ref 20.0–28.0)
FIO2: 0.28
O2 Saturation: 97.4 %
Patient temperature: 37
pCO2 arterial: 39 mmHg (ref 32.0–48.0)
pH, Arterial: 7.47 — ABNORMAL HIGH (ref 7.350–7.450)
pO2, Arterial: 89 mmHg (ref 83.0–108.0)

## 2020-10-17 LAB — ECHOCARDIOGRAM COMPLETE
AR max vel: 1.62 cm2
AV Area VTI: 1.7 cm2
AV Area mean vel: 1.42 cm2
AV Mean grad: 6 mmHg
AV Peak grad: 8.8 mmHg
Ao pk vel: 1.48 m/s
Area-P 1/2: 8.62 cm2
Height: 64 in
MV VTI: 2.99 cm2
S' Lateral: 3.9 cm
Weight: 3873.04 oz

## 2020-10-17 LAB — BASIC METABOLIC PANEL
Anion gap: 10 (ref 5–15)
BUN: 12 mg/dL (ref 6–20)
CO2: 23 mmol/L (ref 22–32)
Calcium: 8.5 mg/dL — ABNORMAL LOW (ref 8.9–10.3)
Chloride: 103 mmol/L (ref 98–111)
Creatinine, Ser: 0.76 mg/dL (ref 0.44–1.00)
GFR, Estimated: >60 mL/min (ref >60)
Glucose, Bld: 123 mg/dL — ABNORMAL HIGH (ref 70–99)
Potassium: 3.8 mmol/L (ref 3.5–5.1)
Sodium: 136 mmol/L (ref 135–145)

## 2020-10-17 LAB — GLUCOSE, CAPILLARY
Glucose-Capillary: 168 mg/dL — ABNORMAL HIGH (ref 70–99)
Glucose-Capillary: 125 mg/dL — ABNORMAL HIGH (ref 70–99)

## 2020-10-17 LAB — HIV ANTIBODY (ROUTINE TESTING W REFLEX): HIV Screen 4th Generation wRfx: NONREACTIVE

## 2020-10-17 LAB — HEMOGLOBIN A1C
Hgb A1c MFr Bld: 6.3 % — ABNORMAL HIGH (ref 4.8–5.6)
Mean Plasma Glucose: 134 mg/dL

## 2020-10-17 LAB — CK: Total CK: 4574 U/L — ABNORMAL HIGH (ref 38–234)

## 2020-10-17 MED ORDER — NALOXONE HCL 0.4 MG/ML IJ SOLN
INTRAMUSCULAR | Status: AC
  Filled 2020-10-17: qty 1

## 2020-10-17 MED ORDER — NALOXONE HCL 0.4 MG/ML IJ SOLN
0.4000 mg | INTRAMUSCULAR | Status: DC | PRN
  Administered 2020-10-17 (×2): 0.4 mg via INTRAVENOUS
  Filled 2020-10-17: qty 1

## 2020-10-17 MED ORDER — KETOROLAC TROMETHAMINE 15 MG/ML IJ SOLN
15.0000 mg | Freq: Four times a day (QID) | INTRAMUSCULAR | Status: DC | PRN
  Administered 2020-10-18: 15 mg via INTRAVENOUS
  Filled 2020-10-17: qty 1

## 2020-10-17 MED ORDER — PERFLUTREN LIPID MICROSPHERE
1.0000 mL | INTRAVENOUS | Status: AC | PRN
  Administered 2020-10-17: 2 mL via INTRAVENOUS
  Filled 2020-10-17: qty 10

## 2020-10-17 MED ORDER — ASPIRIN 300 MG RE SUPP
300.0000 mg | Freq: Once | RECTAL | Status: DC
  Filled 2020-10-17: qty 1

## 2020-10-17 MED ORDER — NALOXONE HCL 0.4 MG/ML IJ SOLN
INTRAMUSCULAR | Status: AC
  Administered 2020-10-17: 0.4 mg via INTRAVENOUS
  Filled 2020-10-17: qty 1

## 2020-10-17 MED ORDER — KETOROLAC TROMETHAMINE 15 MG/ML IJ SOLN
15.0000 mg | Freq: Once | INTRAMUSCULAR | Status: AC
  Administered 2020-10-17: 15 mg via INTRAVENOUS
  Filled 2020-10-17: qty 1

## 2020-10-17 NOTE — Plan of Care (Signed)

## 2020-10-17 NOTE — Progress Notes (Signed)
Rapid Response Event Note   Reason for Call : AMS, slurred speech   Initial Focused Assessment: Patient in bed, drowsy, slurred speech noted. BE FAST completed, negative. Patient had received seroquel, topamax, trazodone and '2mg'$  of morphine last night at bedtime. RR orders placed while waiting on MD, 0.'4mg'$  of Narcan given. Patient immediately more alert, moving around in bed, stating that she had to pee. C/o back pain and hip pain, per primary RN patient has hx of same. Came in for rhabdo after laying in shower for unknown amount of time. MD to bedside, decided that pt was safe to remain on med-surg, but should be transferred to 1C for more frequent neuro checks.      Interventions: Narcan, ABG, EKG, MD to order head CT.    Plan of Care: To be transferred to 1C for more frequent neuro checks, CT head ordered     Event Summary:   MD Notified: Billie Ruddy however Agbata saw patient Call Time: 0820 Arrival Time: [id number] End Time: 0908  Mila Merry, RN

## 2020-10-17 NOTE — Progress Notes (Signed)
Chaplain Maggie responded to Rapid Response to patient's room. Chaplain offered a non-anxious presence at the bedside and prayer for relief, peace, and rest. Available for continued support per on call chaplain.

## 2020-10-17 NOTE — Progress Notes (Signed)
   10/17/20 0844  Notify: Provider  Provider Name/Title Dr. Francine Graven  Date Provider Notified 10/17/20  Time Provider Notified [phone]  Notification Type Face-to-face  Notification Reason Change in status  Provider response At bedside  Date of Provider Response 10/17/20  Time of Provider Response 0845  Document  Patient Outcome Transferred/level of care increased  Progress note created (see row info) Yes  Patient to be transferred to 1 C for neuro checks

## 2020-10-17 NOTE — Progress Notes (Signed)
*  PRELIMINARY RESULTS* Echocardiogram 2D Echocardiogram has been performed.  Kelly Byrd 10/17/2020, 12:02 PM

## 2020-10-17 NOTE — Progress Notes (Signed)
PROGRESS NOTE    Kelly Byrd  [id number] DOB: Sep 18, 1975 DOA: 10/16/2020 PCP: Charlott Rakes, MD  [phone]   Assessment & Plan:   Active Problems:   Rhabdomyolysis   Syncope and collapse   Kelly Byrd is a 45 y.o. female with a past medical history of hypertension, asthma, migraines, chronic anemia, chronic low back pain, lumbar degenerative disc disease, slipped capital femoral epiphysis.  The patient presented to the emergency department after having a unwitnessed mechanical fall in the bathtub yesterday.  Approximately down for 4 to 6 hours.  No previous history of seizures.  Does not remember what happened.  Just states that she blacked out.  No prior history of the same.  In the emergency department her CPK is elevated at 10,000.  Elevated LFTs as well.  White blood cell count is elevated with no clear source of infection.  Vital signs are stable.  She has been given normal saline 1 L bolus and will be subsequently started on maintenance IV fluids.  CT imaging and plain film imaging unremarkable for any acute findings in the emergency department.   Acute traumatic rhabdomyolysis:  --no AKI on presentation.   --CK decreased to 4500's next day with IVF --cont MIVF'@125'$   Somnolence likely due to sedating medication --Pt take oxycodone at home, and since presentation, had received IV morphine, home seroquel, gabapentin, trazodone.  Reported woke up after 2 doses of Narcan. --Hold oxycodone, seroquel and trazodone for now   Syncope and collapse:  --given noted somnolence, this may have been the cause of pt's fall and her inability to remember the event. --carotid US --monitor on tele   Leukocytosis likely reactive: No clear source of infection.  Already improved the next day without abx.   Elevated LFTs:  --AST/ALT 2:1 ratio.  Query alcohol use.   Prediabates:  --hold home metformin   Morbid obesity: BMI 42.  Counseled on lifestyle  modifications.   DVT prophylaxis: Lovenox SQ Code Status: Full code  Family Communication: husband and mother updated at bedside today Level of care: Med-Surg Dispo:   The patient is from: home Anticipated d/c is to: home Anticipated d/c date is: tomorrow Patient currently is not medically ready to d/c due to: need IVF for rhabdo and monitor for kidney function   Subjective and Interval History:  Rapid was called this morning due to pt being somnolent and slurred speech.  Reportedly pt woke up to Narcan, however, fell back to sleep shortly after.  Repeat CT head no acute finding.  Pt did wake up more later in the day, then started complaining about pain.   Objective: Vitals:   10/17/20 1227 10/17/20 1531 10/17/20 1830 10/17/20 2024  BP: 136/82 127/85 137/68 127/74  Pulse: 100 100 92 88  Resp: '18 16 20 19  '$ Temp: 98.3 F (36.8 C) 98.2 F (36.8 C) 98.1 F (36.7 C) 98.4 F (36.9 C)  TempSrc: Oral Oral Oral Oral  SpO2: 100% 100% 100% 100%  Weight:      Height:        Intake/Output Summary (Last 24 hours) at 10/17/2020 2238 Last data filed at 10/17/2020 1858 Gross per 24 hour  Intake 2129.03 ml  Output 1600 ml  Net 529.03 ml   Filed Weights   10/16/20 1435 10/17/20 0430  Weight: 111.6 kg 109.8 kg    Examination:   Constitutional: NAD, AAOx3 HEENT: conjunctivae and lids normal, EOMI CV: No cyanosis.   RESP: normal respiratory effort, on RA Extremities: No effusions,  edema in BLE SKIN: warm, dry Neuro: II - XII grossly intact.   Psych: Normal mood and affect.  Appropriate judgement and reason   Data Reviewed: I have personally reviewed following labs and imaging studies  CBC: Recent Labs  Lab 10/16/20 1604 10/17/20 0447 10/17/20 0909  WBC 17.6* 12.2* 12.5*  NEUTROABS 15.2*  --   --   HGB 15.8* 14.4 14.6  HCT 48.1* 44.1 42.8  MCV 77.8* 77.4* 76.7*  PLT 252 294 [id number]   Basic Metabolic Panel: Recent Labs  Lab 10/16/20 1604 10/17/20 0909  NA 134* 136   K 3.8 3.8  CL 101 103  CO2 24 23  GLUCOSE 138* 123*  BUN 14 12  CREATININE 0.85 0.76  CALCIUM 8.8* 8.5*   GFR: Estimated Creatinine Clearance: 109.8 mL/min (by C-G formula based on SCr of 0.76 mg/dL). Liver Function Tests: Recent Labs  Lab 10/16/20 1604  AST 149*  ALT 67*  ALKPHOS 76  BILITOT 0.9  PROT 7.2  ALBUMIN 3.5   No results for input(s): LIPASE, AMYLASE in the last 168 hours. No results for input(s): AMMONIA in the last 168 hours. Coagulation Profile: No results for input(s): INR, PROTIME in the last 168 hours. Cardiac Enzymes: Recent Labs  Lab 10/16/20 1604 10/17/20 0447  CKTOTAL 10,037* 4,574*   BNP (last 3 results) No results for input(s): PROBNP in the last 8760 hours. HbA1C: No results for input(s): HGBA1C in the last 72 hours. CBG: Recent Labs  Lab 10/17/20 0543 10/17/20 0818  GLUCAP 168* 125*   Lipid Profile: No results for input(s): CHOL, HDL, LDLCALC, TRIG, CHOLHDL, LDLDIRECT in the last 72 hours. Thyroid Function Tests: Recent Labs    10/16/20 1604  TSH 0.730   Anemia Panel: No results for input(s): VITAMINB12, FOLATE, FERRITIN, TIBC, IRON, RETICCTPCT in the last 72 hours. Sepsis Labs: No results for input(s): PROCALCITON, LATICACIDVEN in the last 168 hours.  Recent Results (from the past 240 hour(s))  Resp Panel by RT-PCR (Flu A&B, Covid) Nasopharyngeal Swab     Status: None   Collection Time: 10/16/20  5:33 PM   Specimen: Nasopharyngeal Swab; Nasopharyngeal(NP) swabs in vial transport medium  Result Value Ref Range Status   SARS Coronavirus 2 by RT PCR NEGATIVE NEGATIVE Final    Comment: (NOTE) SARS-CoV-2 target nucleic acids are NOT DETECTED.  The SARS-CoV-2 RNA is generally detectable in upper respiratory specimens during the acute phase of infection. The lowest concentration of SARS-CoV-2 viral copies this assay can detect is 138 copies/mL. A negative result does not preclude SARS-Cov-2 infection and should not be used as  the sole basis for treatment or other patient management decisions. A negative result may occur with  improper specimen collection/handling, submission of specimen other than nasopharyngeal swab, presence of viral mutation(s) within the areas targeted by this assay, and inadequate number of viral copies(<138 copies/mL). A negative result must be combined with clinical observations, patient history, and epidemiological information. The expected result is Negative.  Fact Sheet for Patients:  EntrepreneurPulse.com.au  Fact Sheet for Healthcare Providers:  IncredibleEmployment.be  This test is no t yet approved or cleared by the [place] FDA and  has been authorized for detection and/or diagnosis of SARS-CoV-2 by FDA under an Emergency Use Authorization (EUA). This EUA will remain  in effect (meaning this test can be used) for the duration of the COVID-19 declaration under Section 564(b)(1) of the Act, 21 U.S.C.section 360bbb-3(b)(1), unless the authorization is terminated  or revoked sooner.  Influenza A by PCR NEGATIVE NEGATIVE Final   Influenza B by PCR NEGATIVE NEGATIVE Final    Comment: (NOTE) The Xpert Xpress SARS-CoV-2/FLU/RSV plus assay is intended as an aid in the diagnosis of influenza from Nasopharyngeal swab specimens and should not be used as a sole basis for treatment. Nasal washings and aspirates are unacceptable for Xpert Xpress SARS-CoV-2/FLU/RSV testing.  Fact Sheet for Patients: EntrepreneurPulse.com.au  Fact Sheet for Healthcare Providers: IncredibleEmployment.be  This test is not yet approved or cleared by the [place] FDA and has been authorized for detection and/or diagnosis of SARS-CoV-2 by FDA under an Emergency Use Authorization (EUA). This EUA will remain in effect (meaning this test can be used) for the duration of the COVID-19 declaration under Section 564(b)(1) of  the Act, 21 U.S.C. section 360bbb-3(b)(1), unless the authorization is terminated or revoked.  Performed at [facility], [address] [place], [place] [address]       Radiology Studies: DG Chest 1 View  Result Date: 10/16/2020 CLINICAL DATA:  Fall in shower with chest pain, initial encounter EXAM: CHEST  1 VIEW COMPARISON:  04/20/2020 FINDINGS: The heart size and mediastinal contours are within normal limits. Both lungs are clear. The visualized skeletal structures are unremarkable. IMPRESSION: No active disease. Electronically Signed   By: Inez Catalina M.D.   On: 10/16/2020 16:03   DG Shoulder Right  Result Date: 10/16/2020 CLINICAL DATA:  Recent fall in the shower with right shoulder pain, initial encounter EXAM: RIGHT SHOULDER - 2+ VIEW COMPARISON:  09/30/2017 FINDINGS: Degenerative changes of the acromioclavicular joint are seen. Humeral head is well seated. No acute fracture or dislocation is noted. Underlying bony thorax is unremarkable. IMPRESSION: Degenerative change without acute abnormality. Electronically Signed   By: Inez Catalina M.D.   On: 10/16/2020 16:05   CT HEAD WO CONTRAST  Result Date: 10/17/2020 CLINICAL DATA:  Mental status change. EXAM: CT HEAD WITHOUT CONTRAST TECHNIQUE: Contiguous axial images were obtained from the base of the skull through the vertex without intravenous contrast. COMPARISON:  CT head without contrast 10/16/2020 and 02/29/2020 FINDINGS: Brain: No acute infarct, hemorrhage, or mass lesion is present. No significant white matter lesions are present. The ventricles are of normal size. No significant extraaxial fluid collection is present. Cerebellar tonsils extend to the foramen magnum without Chiari malformation. This is stable. Vascular: No hyperdense vessel or unexpected calcification. Skull: Calvarium is intact. No focal lytic or blastic lesions are present. No significant extracranial soft tissue lesion is present. Sinuses/Orbits: The  paranasal sinuses and mastoid air cells are clear. The globes and orbits are within normal limits. IMPRESSION: Negative CT of the head. Electronically Signed   By: San Morelle M.D.   On: 10/17/2020 10:09   CT Head Wo Contrast  Result Date: 10/16/2020 CLINICAL DATA:  Unwitnessed fall in the bathtub. EXAM: CT HEAD WITHOUT CONTRAST CT CERVICAL SPINE WITHOUT CONTRAST TECHNIQUE: Multidetector CT imaging of the head and cervical spine was performed following the standard protocol without intravenous contrast. Multiplanar CT image reconstructions of the cervical spine were also generated. COMPARISON:  Head CT 10/26/2014 FINDINGS: CT HEAD FINDINGS Brain: The brain shows a normal appearance without evidence of malformation, atrophy, old or acute small or large vessel infarction, mass lesion, hemorrhage, hydrocephalus or extra-axial collection. Vascular: No hyperdense vessel. No evidence of atherosclerotic calcification. Skull: Normal.  No traumatic finding.  No focal bone lesion. Sinuses/Orbits: Sinuses are clear. Orbits appear normal. Mastoids are clear. Other: None significant CT CERVICAL SPINE FINDINGS Alignment:  No traumatic malalignment. Mild forward bending of the neck. Skull base and vertebrae: No regional fracture. Soft tissues and spinal canal: No evidence of soft tissue injury. Disc levels: No disc level pathology is seen. Facets on the right are normal. On the left, there is osteoarthritis of the facets at C6-7, C7-T1 and T1-2 with mild left foraminal encroachment. Upper chest: Negative Other: None IMPRESSION: Head CT: Normal Cervical spine CT: No acute or traumatic finding. 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 Electronically Signed   By: Nelson Chimes M.D.   On: 10/16/2020 15:52   CT Cervical Spine Wo Contrast  Result Date: 10/16/2020 CLINICAL DATA:  Unwitnessed fall in the bathtub. EXAM: CT HEAD WITHOUT CONTRAST CT CERVICAL SPINE WITHOUT CONTRAST TECHNIQUE: Multidetector CT imaging  of the head and cervical spine was performed following the standard protocol without intravenous contrast. Multiplanar CT image reconstructions of the cervical spine were also generated. COMPARISON:  Head CT 10/26/2014 FINDINGS: CT HEAD FINDINGS Brain: The brain shows a normal appearance without evidence of malformation, atrophy, old or acute small or large vessel infarction, mass lesion, hemorrhage, hydrocephalus or extra-axial collection. Vascular: No hyperdense vessel. No evidence of atherosclerotic calcification. Skull: Normal.  No traumatic finding.  No focal bone lesion. Sinuses/Orbits: Sinuses are clear. Orbits appear normal. Mastoids are clear. Other: None significant CT CERVICAL SPINE FINDINGS Alignment: No traumatic malalignment. Mild forward bending of the neck. Skull base and vertebrae: No regional fracture. Soft tissues and spinal canal: No evidence of soft tissue injury. Disc levels: No disc level pathology is seen. Facets on the right are normal. On the left, there is osteoarthritis of the facets at C6-7, C7-T1 and T1-2 with mild left foraminal encroachment. Upper chest: Negative Other: None IMPRESSION: Head CT: Normal Cervical spine CT: No acute or traumatic finding. 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 Electronically Signed   By: Nelson Chimes M.D.   On: 10/16/2020 15:52   ECHOCARDIOGRAM COMPLETE  Result Date: 10/17/2020    ECHOCARDIOGRAM REPORT   Patient Name:   CLARIBEL BOTA Date of Exam: 10/17/2020 Medical Rec #:  [id number]               Height:       64.0 in Accession #:    [id number]              Weight:       242.1 lb Date of Birth:  1975-09-22              BSA:          2.122 m Patient Age:    89 years                BP:           109/75 mmHg Patient Gender: F                       HR:           95 bpm. Exam Location:  [facility] Procedure: 2D Echo, Color Doppler, Cardiac Doppler and Intracardiac            Opacification Agent Indications:     R55 Syncope  History:          Patient has no prior history of Echocardiogram examinations.                  CKD; Risk Factors:Hypertension. Asthma.  Sonographer:     Charmayne Sheer RDCS (AE)  Referring Phys:  Omega Diagnosing Phys: Ida Rogue MD  Sonographer Comments: Suboptimal apical window and no subcostal window. Image acquisition challenging due to patient body habitus. IMPRESSIONS  1. Left ventricular ejection fraction, by estimation, is 50 to 55%. The left ventricle has low normal function. The left ventricle has no regional wall motion abnormalities. Left ventricular diastolic parameters are consistent with Grade I diastolic dysfunction (impaired relaxation).  2. Right ventricular systolic function is normal. The right ventricular size is normal. Tricuspid regurgitation signal is inadequate for assessing PA pressure.  3. The aortic valve was not well visualized. Unable to determine number of leaflets. Aortic valve regurgitation is not visualized. Mild aortic valve sclerosis is present, with no evidence of aortic valve stenosis. FINDINGS  Left Ventricle: Left ventricular ejection fraction, by estimation, is 50 to 55%. The left ventricle has low normal function. The left ventricle has no regional wall motion abnormalities. Definity contrast agent was given IV to delineate the left ventricular endocardial borders. The left ventricular internal cavity size was normal in size. There is no left ventricular hypertrophy. Left ventricular diastolic parameters are consistent with Grade I diastolic dysfunction (impaired relaxation). Right Ventricle: The right ventricular size is normal. No increase in right ventricular wall thickness. Right ventricular systolic function is normal. Tricuspid regurgitation signal is inadequate for assessing PA pressure. Left Atrium: Left atrial size was normal in size. Right Atrium: Right atrial size was normal in size. Pericardium: There is no evidence of pericardial effusion. Mitral Valve: The  mitral valve is normal in structure. No evidence of mitral valve regurgitation. No evidence of mitral valve stenosis. MV peak gradient, 2.8 mmHg. The mean mitral valve gradient is 1.0 mmHg. Tricuspid Valve: The tricuspid valve is normal in structure. Tricuspid valve regurgitation is not demonstrated. No evidence of tricuspid stenosis. Aortic Valve: The aortic valve was not well visualized. Aortic valve regurgitation is not visualized. Mild aortic valve sclerosis is present, with no evidence of aortic valve stenosis. Aortic valve mean gradient measures 6.0 mmHg. Aortic valve peak gradient measures 8.8 mmHg. Aortic valve area, by VTI measures 1.70 cm. Pulmonic Valve: The pulmonic valve was normal in structure. Pulmonic valve regurgitation is not visualized. No evidence of pulmonic stenosis. Aorta: The aortic root is normal in size and structure. Venous: The pulmonary veins were not well visualized. The inferior vena cava is normal in size with greater than 50% respiratory variability, suggesting right atrial pressure of 3 mmHg. IAS/Shunts: No atrial level shunt detected by color flow Doppler.  LEFT VENTRICLE PLAX 2D LVIDd:         4.97 cm  Diastology LVIDs:         3.90 cm  LV e' medial:    5.44 cm/s LV PW:         1.01 cm  LV E/e' medial:  12.8 LV IVS:        1.11 cm  LV e' lateral:   5.44 cm/s LVOT diam:     2.10 cm  LV E/e' lateral: 12.8 LV SV:         38 LV SV Index:   18 LVOT Area:     3.46 cm  RIGHT VENTRICLE RV Basal diam:  3.25 cm LEFT ATRIUM             Index       RIGHT ATRIUM           Index LA diam:        3.20 cm 1.51 cm/m  RA Area:     19.40 cm LA Vol (A2C):   23.4 ml 11.03 ml/m RA Volume:   59.70 ml  28.14 ml/m LA Vol (A4C):   32.3 ml 15.22 ml/m LA Biplane Vol: 29.6 ml 13.95 ml/m  AORTIC VALVE                    PULMONIC VALVE AV Area (Vmax):    1.62 cm     PV Vmax:       0.84 m/s AV Area (Vmean):   1.42 cm     PV Vmean:      55.600 cm/s AV Area (VTI):     1.70 cm     PV VTI:        0.133 m  AV Vmax:           148.00 cm/s  PV Peak grad:  2.8 mmHg AV Vmean:          117.000 cm/s PV Mean grad:  1.0 mmHg AV VTI:            0.222 m AV Peak Grad:      8.8 mmHg AV Mean Grad:      6.0 mmHg LVOT Vmax:         69.20 cm/s LVOT Vmean:        48.000 cm/s LVOT VTI:          0.109 m LVOT/AV VTI ratio: 0.49  AORTA Ao Root diam: 3.20 cm MITRAL VALVE MV Area (PHT): 8.62 cm    SHUNTS MV Area VTI:   2.99 cm    Systemic VTI:  0.11 m MV Peak grad:  2.8 mmHg    Systemic Diam: 2.10 cm MV Mean grad:  1.0 mmHg MV Vmax:       0.84 m/s MV Vmean:      57.2 cm/s MV Decel Time: 88 msec MV E velocity: 69.80 cm/s MV A velocity: 75.00 cm/s MV E/A ratio:  0.93 Ida Rogue MD Electronically signed by Ida Rogue MD Signature Date/Time: 10/17/2020/5:00:47 PM    Final    DG Hip Unilat W or Wo Pelvis 2-3 Views Right  Result Date: 10/16/2020 CLINICAL DATA:  Recent fall in shower with right hip pain, initial encounter EXAM: DG HIP (WITH OR WITHOUT PELVIS) 3V RIGHT COMPARISON:  10/28/2014 FINDINGS: Remodeling of the right femoral head is noted similar to that seen in 2016 shown on prior MRI to represent healed slipped capital femoral epiphysis. Degenerative changes of the right hip joint is noted. No soft tissue abnormality is seen. Pelvic ring is intact. IMPRESSION: Chronic changes in the right hip as described. These are stable from previous exam in 2016. Electronically Signed   By: Inez Catalina M.D.   On: 10/16/2020 16:04     Scheduled Meds:  amLODipine  10 mg Oral Daily   aspirin  300 mg Rectal Once   enoxaparin (LOVENOX) injection  0.5 mg/kg Subcutaneous Q24H   gabapentin  600 mg Oral BID   loratadine  10 mg Oral Daily   metFORMIN  500 mg Oral BID WC   mometasone-formoterol  2 puff Inhalation BID   montelukast  10 mg Oral QHS   potassium chloride SA  20 mEq Oral Daily   sertraline  100 mg Oral Daily   sodium chloride flush  3 mL Intravenous Q12H   topiramate  50 mg Oral BID   Continuous Infusions:  sodium  chloride 125 mL/hr at 10/17/20 1531     LOS:  0 days     Enzo Bi, MD [facility] If 7PM-7AM, please contact night-coverage 10/17/2020, 10:38 PM

## 2020-10-17 NOTE — Progress Notes (Signed)
   10/17/20 1152  Notify: Provider  Provider Name/Title Dr. Billie Ruddy  Date Provider Notified 10/17/20  Time Provider Notified 1041  Notification Type Page  Notification Reason Other (Comment) (Update regarding patient condition)  Provider response No new orders  Date of Provider Response 10/17/20  Time of Provider Response 1041  Document  Patient Outcome Stabilized after interventions (Patient still asleep, but can be aroused MD aware)  Progress note created (see row info) Yes

## 2020-10-17 NOTE — Progress Notes (Signed)
Dr. Billie Ruddy okay for Caroid doppler to be held off till tomorrow

## 2020-10-17 NOTE — Progress Notes (Signed)
   10/17/20 0824  Assess: MEWS Score  Temp 98.2 F (36.8 C)  BP 131/89  Pulse Rate (!) 101  Resp 20  Level of Consciousness Responds to Voice  SpO2 98 %  Assess: MEWS Score  MEWS Temp 0  MEWS Systolic 0  MEWS Pulse 1  MEWS RR 0  MEWS LOC 1  MEWS Score 2  MEWS Score Color Yellow  Assess: if the MEWS score is Yellow or Red  Were vital signs taken at a resting state? Yes  Focused Assessment Change from prior assessment (see assessment flowsheet)  Does the patient meet 2 or more of the SIRS criteria? No  MEWS guidelines implemented *See Row Information* Yes  Treat  MEWS Interventions Escalated (See documentation below);Consulted Respiratory Therapy  Pain Scale 0-10  Pain Score 0  Take Vital Signs  Increase Vital Sign Frequency  Yellow: Q 2hr X 2 then Q 4hr X 2, if remains yellow, continue Q 4hrs  Escalate  MEWS: Escalate Yellow: discuss with charge nurse/RN and consider discussing with provider and RRT  Notify: Charge Nurse/RN  Name of Charge Nurse/RN Notified Claiborne Billings, RN  Date Charge Nurse/RN Notified 10/17/20  Time Charge Nurse/RN Notified 0825  Notify: Provider  Provider Name/Title Dr. Billie Ruddy  Date Provider Notified 10/17/20  Time Provider Notified [phone]  Notification Type Page  Notification Reason Change in status  Notify: Rapid Response  Name of Rapid Response RN Notified Sarah,RN  Date Rapid Response Notified 10/17/20  Time Rapid Response Notified 0830  Document  Patient Outcome Transferred/level of care increased  Progress note created (see row info) Yes  Assess: SIRS CRITERIA  SIRS Temperature  0  SIRS Pulse 1  SIRS Respirations  0  SIRS WBC 0  SIRS Score Sum  1  Change in Neuro status

## 2020-10-18 LAB — BASIC METABOLIC PANEL
Anion gap: 5 (ref 5–15)
BUN: 10 mg/dL (ref 6–20)
CO2: 26 mmol/L (ref 22–32)
Calcium: 8.1 mg/dL — ABNORMAL LOW (ref 8.9–10.3)
Chloride: 107 mmol/L (ref 98–111)
Creatinine, Ser: 0.62 mg/dL (ref 0.44–1.00)
GFR, Estimated: >60 mL/min (ref >60)
Glucose, Bld: 113 mg/dL — ABNORMAL HIGH (ref 70–99)
Potassium: 3.4 mmol/L — ABNORMAL LOW (ref 3.5–5.1)
Sodium: 138 mmol/L (ref 135–145)

## 2020-10-18 LAB — CK: Total CK: 2503 U/L — ABNORMAL HIGH (ref 38–234)

## 2020-10-18 LAB — CBC
HCT: 41 % (ref 36.0–46.0)
Hemoglobin: 13.2 g/dL (ref 12.0–15.0)
MCH: 25 pg — ABNORMAL LOW (ref 26.0–34.0)
MCHC: 32.2 g/dL (ref 30.0–36.0)
MCV: 77.7 fL — ABNORMAL LOW (ref 80.0–100.0)
Platelets: 250 10*3/uL (ref 150–400)
RBC: 5.28 MIL/uL — ABNORMAL HIGH (ref 3.87–5.11)
RDW: 16.9 % — ABNORMAL HIGH (ref 11.5–15.5)
WBC: 11 10*3/uL — ABNORMAL HIGH (ref 4.0–10.5)
nRBC: 0 % (ref 0.0–0.2)

## 2020-10-18 LAB — GLUCOSE, CAPILLARY: Glucose-Capillary: 116 mg/dL — ABNORMAL HIGH (ref 70–99)

## 2020-10-18 LAB — MAGNESIUM: Magnesium: 2.1 mg/dL (ref 1.7–2.4)

## 2020-10-18 MED ORDER — TOPIRAMATE 50 MG PO TABS: 50.0000 mg | ORAL_TABLET | Freq: Two times a day (BID) | ORAL | Status: DC

## 2020-10-18 MED ORDER — POTASSIUM CHLORIDE 20 MEQ PO PACK
40.0000 meq | PACK | Freq: Once | ORAL | Status: AC
  Administered 2020-10-18: 40 meq via ORAL
  Filled 2020-10-18: qty 2

## 2020-10-18 MED ORDER — CELECOXIB 100 MG PO CAPS: 100.0000 mg | ORAL_CAPSULE | Freq: Every day | ORAL | Status: DC | PRN

## 2020-10-18 NOTE — Progress Notes (Signed)
Patient given instructions to make follow up appointment, medication education and safety, IV and tele discontinued, & patient discharged with all belongings.

## 2020-10-18 NOTE — Discharge Summary (Signed)
Physician Discharge Summary   Kelly Byrd  female DOB: [birth year]  FZ:[id number]  PCP: Charlott Rakes, MD  Admit date: 10/16/2020 Discharge date: 10/18/2020  Admitted From: home Disposition:  home CODE STATUS: Full code  Discharge Instructions     Discharge instructions   Complete by: As directed    You have had muscle injuries due to the fall and being down for a few hours.  You may have become too somnolent from all the sedating medications you are on, Percocet, gabapentin, seroquel, trazodone, baclofen, benadryl.  Please work with your outpatient prescriber to taper you down on your sedating medications.   Dr. [facility] Course:  For full details, please see H&P, progress notes, consult notes and ancillary notes.  Briefly,  Kelly Byrd is a 45 y.o. female with a past medical history of hypertension, asthma, migraines, chronic anemia, chronic low back pain, lumbar degenerative disc disease, slipped capital femoral epiphysis.  The patient presented to the emergency department after having a unwitnessed mechanical fall in the bathtub yesterday.  Approximately down for 2 hours, per husband.  No previous history of seizures.  Does not remember what happened.  Just states that she blacked out.  No prior history of the same.  In the emergency department her CPK is elevated at 10,000.  Elevated LFTs as well.  White blood cell count is elevated with no clear source of infection.  Vital signs are stable.  She was given normal saline 1 L bolus and started on maintenance IV fluids.  CT imaging and plain film imaging unremarkable for any acute findings in the emergency department.   Acute traumatic rhabdomyolysis:  --no AKI  --CK decreased to 4500's next day with IVF --cont MIVF'@125'$  until discharge.   Somnolence likely due to sedating medication --Pt take oxycodone at home, and since presentation, had received IV morphine, home seroquel,  gabapentin, trazodone.  Reported woke up after 2 doses of Narcan. --home oxycodone, seroquel and trazodone held during hospitalization. --pt advised to work with outpatient prescriber to taper down on sedating medications.   Syncope and collapse:  --given noted somnolence, this may have been the cause of pt's fall and her inability to remember the event. --carotid US neg for stenosis --tele no significant events. --pt advised to work with outpatient prescriber to taper down on sedating medications.   Leukocytosis likely reactive:  No source of infection.  Already improved the next day without abx.   Elevated LFTs:  --AST/ALT 2:1 ratio.  Query alcohol use.   Prediabates:  --home metformin while inpatient, resumed at discharge.   Morbid obesity: BMI 42.  Counseled on lifestyle modifications.    Discharge Diagnoses:  Active Problems:   Rhabdomyolysis   Syncope and collapse   30 Day Unplanned Readmission Risk Score    Flowsheet Row ED to Hosp-Admission (Current) from 10/16/2020 in [place]  30 Day Unplanned Readmission Risk Score (%) 18.84 Filed at 10/18/2020 0800       This score is the patient's risk of an unplanned readmission within 30 days of being discharged (0 -100%). The score is based on dignosis, age, lab data, medications, orders, and past utilization.   Low:  0-14.9   Medium: 15-21.9   High: 22-29.9   Extreme: 30 and above         Discharge Instructions:  Allergies as of 10/18/2020       Reactions  Ace Inhibitors Swelling   Throat and face   Lisinopril Other (See Comments)   Swelling--caused the pt to stay in the hospital   Peanuts [peanut Oil] Other (See Comments)   Swelling--caused the pt to stay in the hospital        Medication List     STOP taking these medications    diphenhydrAMINE 50 MG tablet Commonly known as: BENADRYL   ondansetron 4 MG disintegrating tablet Commonly known as: Zofran ODT        TAKE these medications    amLODipine 10 MG tablet Commonly known as: NORVASC Take 1 tablet by mouth once daily   atorvastatin 20 MG tablet Commonly known as: LIPITOR Take 1 tablet (20 mg total) by mouth daily.   baclofen 10 MG tablet Commonly known as: LIORESAL Take 10 mg by mouth in the morning and at bedtime.   celecoxib 100 MG capsule Commonly known as: CELEBREX Take 1 capsule (100 mg total) by mouth daily as needed. What changed:  when to take this reasons to take this   cetirizine 10 MG tablet Commonly known as: ZYRTEC Take 1 tablet (10 mg total) by mouth daily.   diclofenac Sodium 1 % Gel Commonly known as: VOLTAREN Apply 1 application topically 4 (four) times daily as needed (pain.).   Dulera 200-5 MCG/ACT Aero Generic drug: mometasone-formoterol Inhale 2 puffs into the lungs in the morning and at bedtime.   EPINEPHrine 0.3 mg/0.3 mL Soaj injection Commonly known as: EPI-PEN Inject 0.3 mLs (0.3 mg total) into the muscle as needed for anaphylaxis.   famotidine 20 MG tablet Commonly known as: PEPCID Take 1 tablet (20 mg total) by mouth 2 (two) times daily.   fluticasone 50 MCG/ACT nasal spray Commonly known as: FLONASE Place 2 sprays into both nostrils daily.   gabapentin 300 MG capsule Commonly known as: NEURONTIN Take 2 capsules by mouth twice daily   metFORMIN 500 MG tablet Commonly known as: GLUCOPHAGE Take 1 tablet (500 mg total) by mouth 2 (two) times daily with a meal.   montelukast 10 MG tablet Commonly known as: SINGULAIR Take 1 tablet (10 mg total) by mouth at bedtime.   oxyCODONE-acetaminophen 7.5-325 MG tablet Commonly known as: PERCOCET Take 1 tablet by mouth every 8 (eight) hours as needed (pain.).   Potassium Chloride ER 20 MEQ Tbcr Take 20 mEq by mouth daily.   QUEtiapine 200 MG tablet Commonly known as: SEROQUEL Take 200 mg by mouth at bedtime.   sertraline 100 MG tablet Commonly known as: ZOLOFT Take 100 mg by mouth  daily.   SUMAtriptan 100 MG tablet Commonly known as: Imitrex 100 mg orally at the onset of a migraine, may repeat in 2 hours if headache persists or recurs. Max '200mg'$  in 24 hrs   topiramate 50 MG tablet Commonly known as: Topamax Take 1 tablet (50 mg total) by mouth 2 (two) times daily.   traZODone 100 MG tablet Commonly known as: DESYREL Take 1 tablet (100 mg total) by mouth at bedtime.   Ventolin HFA 108 (90 Base) MCG/ACT inhaler Generic drug: albuterol INHALE 2 PUFFS BY MOUTH EVERY 6 HOURS AS NEEDED FOR WHEEZING OR SHORTNESS OF BREATH         Follow-up Information     Charlott Rakes, MD Follow up in 1 week(s).   Specialty: Family Medicine Contact information: [address] [address] [phone]                 Allergies  Allergen Reactions   Ace Inhibitors Swelling    Throat and face   Lisinopril Other (See Comments)    Swelling--caused the pt to stay in the hospital   Peanuts [Peanut Oil] Other (See Comments)    Swelling--caused the pt to stay in the hospital     The results of significant diagnostics from this hospitalization (including imaging, microbiology, ancillary and laboratory) are listed below for reference.   Consultations:   Procedures/Studies: DG Chest 1 View  Result Date: 10/16/2020 CLINICAL DATA:  Fall in shower with chest pain, initial encounter EXAM: CHEST  1 VIEW COMPARISON:  04/20/2020 FINDINGS: The heart size and mediastinal contours are within normal limits. Both lungs are clear. The visualized skeletal structures are unremarkable. IMPRESSION: No active disease. Electronically Signed   By: Inez Catalina M.D.   On: 10/16/2020 16:03   DG Shoulder Right  Result Date: 10/16/2020 CLINICAL DATA:  Recent fall in the shower with right shoulder pain, initial encounter EXAM: RIGHT SHOULDER - 2+ VIEW COMPARISON:  09/30/2017 FINDINGS: Degenerative changes of the acromioclavicular joint are seen. Humeral head is well  seated. No acute fracture or dislocation is noted. Underlying bony thorax is unremarkable. IMPRESSION: Degenerative change without acute abnormality. Electronically Signed   By: Inez Catalina M.D.   On: 10/16/2020 16:05   CT HEAD WO CONTRAST  Result Date: 10/17/2020 CLINICAL DATA:  Mental status change. EXAM: CT HEAD WITHOUT CONTRAST TECHNIQUE: Contiguous axial images were obtained from the base of the skull through the vertex without intravenous contrast. COMPARISON:  CT head without contrast 10/16/2020 and 02/29/2020 FINDINGS: Brain: No acute infarct, hemorrhage, or mass lesion is present. No significant white matter lesions are present. The ventricles are of normal size. No significant extraaxial fluid collection is present. Cerebellar tonsils extend to the foramen magnum without Chiari malformation. This is stable. Vascular: No hyperdense vessel or unexpected calcification. Skull: Calvarium is intact. No focal lytic or blastic lesions are present. No significant extracranial soft tissue lesion is present. Sinuses/Orbits: The paranasal sinuses and mastoid air cells are clear. The globes and orbits are within normal limits. IMPRESSION: Negative CT of the head. Electronically Signed   By: San Morelle M.D.   On: 10/17/2020 10:09   CT Head Wo Contrast  Result Date: 10/16/2020 CLINICAL DATA:  Unwitnessed fall in the bathtub. EXAM: CT HEAD WITHOUT CONTRAST CT CERVICAL SPINE WITHOUT CONTRAST TECHNIQUE: Multidetector CT imaging of the head and cervical spine was performed following the standard protocol without intravenous contrast. Multiplanar CT image reconstructions of the cervical spine were also generated. COMPARISON:  Head CT 10/26/2014 FINDINGS: CT HEAD FINDINGS Brain: The brain shows a normal appearance without evidence of malformation, atrophy, old or acute small or large vessel infarction, mass lesion, hemorrhage, hydrocephalus or extra-axial collection. Vascular: No hyperdense vessel. No  evidence of atherosclerotic calcification. Skull: Normal.  No traumatic finding.  No focal bone lesion. Sinuses/Orbits: Sinuses are clear. Orbits appear normal. Mastoids are clear. Other: None significant CT CERVICAL SPINE FINDINGS Alignment: No traumatic malalignment. Mild forward bending of the neck. Skull base and vertebrae: No regional fracture. Soft tissues and spinal canal: No evidence of soft tissue injury. Disc levels: No disc level pathology is seen. Facets on the right are normal. On the left, there is osteoarthritis of the facets at C6-7, C7-T1 and T1-2 with mild left foraminal encroachment. Upper chest: Negative Other: None IMPRESSION: Head CT: Normal Cervical spine CT: No acute or traumatic finding. 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 Electronically Signed  By: Nelson Chimes M.D.   On: 10/16/2020 15:52   CT Cervical Spine Wo Contrast  Result Date: 10/16/2020 CLINICAL DATA:  Unwitnessed fall in the bathtub. EXAM: CT HEAD WITHOUT CONTRAST CT CERVICAL SPINE WITHOUT CONTRAST TECHNIQUE: Multidetector CT imaging of the head and cervical spine was performed following the standard protocol without intravenous contrast. Multiplanar CT image reconstructions of the cervical spine were also generated. COMPARISON:  Head CT 10/26/2014 FINDINGS: CT HEAD FINDINGS Brain: The brain shows a normal appearance without evidence of malformation, atrophy, old or acute small or large vessel infarction, mass lesion, hemorrhage, hydrocephalus or extra-axial collection. Vascular: No hyperdense vessel. No evidence of atherosclerotic calcification. Skull: Normal.  No traumatic finding.  No focal bone lesion. Sinuses/Orbits: Sinuses are clear. Orbits appear normal. Mastoids are clear. Other: None significant CT CERVICAL SPINE FINDINGS Alignment: No traumatic malalignment. Mild forward bending of the neck. Skull base and vertebrae: No regional fracture. Soft tissues and spinal canal: No evidence of soft tissue  injury. Disc levels: No disc level pathology is seen. Facets on the right are normal. On the left, there is osteoarthritis of the facets at C6-7, C7-T1 and T1-2 with mild left foraminal encroachment. Upper chest: Negative Other: None IMPRESSION: Head CT: Normal Cervical spine CT: No acute or traumatic finding. 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 Electronically Signed   By: Nelson Chimes M.D.   On: 10/16/2020 15:52   US Carotid Bilateral  Result Date: 10/18/2020 CLINICAL DATA:  Syncope, collapse EXAM: BILATERAL CAROTID DUPLEX ULTRASOUND TECHNIQUE: Pearline Cables scale imaging, color Doppler and duplex ultrasound were performed of bilateral carotid and vertebral arteries in the neck. COMPARISON:  None. FINDINGS: Criteria: Quantification of carotid stenosis is based on velocity parameters that correlate the residual internal carotid diameter with NASCET-based stenosis levels, using the diameter of the distal internal carotid lumen as the denominator for stenosis measurement. The following velocity measurements were obtained: RIGHT ICA: 106/30 cm/sec CCA: [id number] cm/sec SYSTOLIC ICA/CCA RATIO:  0.8 ECA:  68 cm/sec LEFT ICA: 91/32 cm/sec CCA: [id number] cm/sec SYSTOLIC ICA/CCA RATIO:  0.9 ECA:  76 cm/sec RIGHT CAROTID ARTERY: No significant atherosclerotic plaque or evidence of stenosis in the internal carotid artery. RIGHT VERTEBRAL ARTERY:  Patent with normal antegrade flow. LEFT CAROTID ARTERY: No significant atherosclerotic plaque or evidence of stenosis in the internal carotid artery. LEFT VERTEBRAL ARTERY:  Patent with normal antegrade flow. IMPRESSION: 1. No significant atherosclerotic plaque or stenosis in either internal carotid artery. 2. Vertebral arteries are patent with normal antegrade flow. Electronically Signed   By: Jacqulynn Cadet M.D.   On: 10/18/2020 08:26   ECHOCARDIOGRAM COMPLETE  Result Date: 10/17/2020    ECHOCARDIOGRAM REPORT   Patient Name:   Kelly Byrd Date of Exam:  10/17/2020 Medical Rec #:  [id number]               Height:       64.0 in Accession #:    [id number]              Weight:       242.1 lb Date of Birth:  [birth year]              BSA:          2.122 m Patient Age:    56 years                BP:           109/75 mmHg Patient Gender: F  HR:           95 bpm. Exam Location:  [facility] Procedure: 2D Echo, Color Doppler, Cardiac Doppler and Intracardiac            Opacification Agent Indications:     R55 Syncope  History:         Patient has no prior history of Echocardiogram examinations.                  CKD; Risk Factors:Hypertension. Asthma.  Sonographer:     Charmayne Sheer RDCS (AE) Referring Phys:  [place] Diagnosing Phys: Ida Rogue MD  Sonographer Comments: Suboptimal apical window and no subcostal window. Image acquisition challenging due to patient body habitus. IMPRESSIONS  1. Left ventricular ejection fraction, by estimation, is 50 to 55%. The left ventricle has low normal function. The left ventricle has no regional wall motion abnormalities. Left ventricular diastolic parameters are consistent with Grade I diastolic dysfunction (impaired relaxation).  2. Right ventricular systolic function is normal. The right ventricular size is normal. Tricuspid regurgitation signal is inadequate for assessing PA pressure.  3. The aortic valve was not well visualized. Unable to determine number of leaflets. Aortic valve regurgitation is not visualized. Mild aortic valve sclerosis is present, with no evidence of aortic valve stenosis. FINDINGS  Left Ventricle: Left ventricular ejection fraction, by estimation, is 50 to 55%. The left ventricle has low normal function. The left ventricle has no regional wall motion abnormalities. Definity contrast agent was given IV to delineate the left ventricular endocardial borders. The left ventricular internal cavity size was normal in size. There is no left ventricular hypertrophy. Left ventricular  diastolic parameters are consistent with Grade I diastolic dysfunction (impaired relaxation). Right Ventricle: The right ventricular size is normal. No increase in right ventricular wall thickness. Right ventricular systolic function is normal. Tricuspid regurgitation signal is inadequate for assessing PA pressure. Left Atrium: Left atrial size was normal in size. Right Atrium: Right atrial size was normal in size. Pericardium: There is no evidence of pericardial effusion. Mitral Valve: The mitral valve is normal in structure. No evidence of mitral valve regurgitation. No evidence of mitral valve stenosis. MV peak gradient, 2.8 mmHg. The mean mitral valve gradient is 1.0 mmHg. Tricuspid Valve: The tricuspid valve is normal in structure. Tricuspid valve regurgitation is not demonstrated. No evidence of tricuspid stenosis. Aortic Valve: The aortic valve was not well visualized. Aortic valve regurgitation is not visualized. Mild aortic valve sclerosis is present, with no evidence of aortic valve stenosis. Aortic valve mean gradient measures 6.0 mmHg. Aortic valve peak gradient measures 8.8 mmHg. Aortic valve area, by VTI measures 1.70 cm. Pulmonic Valve: The pulmonic valve was normal in structure. Pulmonic valve regurgitation is not visualized. No evidence of pulmonic stenosis. Aorta: The aortic root is normal in size and structure. Venous: The pulmonary veins were not well visualized. The inferior vena cava is normal in size with greater than 50% respiratory variability, suggesting right atrial pressure of 3 mmHg. IAS/Shunts: No atrial level shunt detected by color flow Doppler.  LEFT VENTRICLE PLAX 2D LVIDd:         4.97 cm  Diastology LVIDs:         3.90 cm  LV e' medial:    5.44 cm/s LV PW:         1.01 cm  LV E/e' medial:  12.8 LV IVS:        1.11 cm  LV e' lateral:   5.44  cm/s LVOT diam:     2.10 cm  LV E/e' lateral: 12.8 LV SV:         38 LV SV Index:   18 LVOT Area:     3.46 cm  RIGHT VENTRICLE RV Basal diam:   3.25 cm LEFT ATRIUM             Index       RIGHT ATRIUM           Index LA diam:        3.20 cm 1.51 cm/m  RA Area:     19.40 cm LA Vol (A2C):   23.4 ml 11.03 ml/m RA Volume:   59.70 ml  28.14 ml/m LA Vol (A4C):   32.3 ml 15.22 ml/m LA Biplane Vol: 29.6 ml 13.95 ml/m  AORTIC VALVE                    PULMONIC VALVE AV Area (Vmax):    1.62 cm     PV Vmax:       0.84 m/s AV Area (Vmean):   1.42 cm     PV Vmean:      55.600 cm/s AV Area (VTI):     1.70 cm     PV VTI:        0.133 m AV Vmax:           148.00 cm/s  PV Peak grad:  2.8 mmHg AV Vmean:          117.000 cm/s PV Mean grad:  1.0 mmHg AV VTI:            0.222 m AV Peak Grad:      8.8 mmHg AV Mean Grad:      6.0 mmHg LVOT Vmax:         69.20 cm/s LVOT Vmean:        48.000 cm/s LVOT VTI:          0.109 m LVOT/AV VTI ratio: 0.49  AORTA Ao Root diam: 3.20 cm MITRAL VALVE MV Area (PHT): 8.62 cm    SHUNTS MV Area VTI:   2.99 cm    Systemic VTI:  0.11 m MV Peak grad:  2.8 mmHg    Systemic Diam: 2.10 cm MV Mean grad:  1.0 mmHg MV Vmax:       0.84 m/s MV Vmean:      57.2 cm/s MV Decel Time: 88 msec MV E velocity: 69.80 cm/s MV A velocity: 75.00 cm/s MV E/A ratio:  0.93 Ida Rogue MD Electronically signed by Ida Rogue MD Signature Date/Time: 10/17/2020/5:00:47 PM    Final    DG Hip Unilat W or Wo Pelvis 2-3 Views Right  Result Date: 10/16/2020 CLINICAL DATA:  Recent fall in shower with right hip pain, initial encounter EXAM: DG HIP (WITH OR WITHOUT PELVIS) 3V RIGHT COMPARISON:  10/28/2014 FINDINGS: Remodeling of the right femoral head is noted similar to that seen in 2016 shown on prior MRI to represent healed slipped capital femoral epiphysis. Degenerative changes of the right hip joint is noted. No soft tissue abnormality is seen. Pelvic ring is intact. IMPRESSION: Chronic changes in the right hip as described. These are stable from previous exam in 2016. Electronically Signed   By: Inez Catalina M.D.   On: 10/16/2020 16:04      Labs: BNP  (last 3 results) No results for input(s): BNP in the last 8760 hours. Basic Metabolic Panel: Recent Labs  Lab 10/16/20 1604  10/17/20 0909 10/18/20 0531  NA 134* 136 138  K 3.8 3.8 3.4*  CL 101 103 107  CO2 '24 23 26  '$ GLUCOSE 138* 123* 113*  BUN '14 12 10  '$ CREATININE 0.85 0.76 0.62  CALCIUM 8.8* 8.5* 8.1*  MG  --   --  2.1   Liver Function Tests: Recent Labs  Lab 10/16/20 1604  AST 149*  ALT 67*  ALKPHOS 76  BILITOT 0.9  PROT 7.2  ALBUMIN 3.5   No results for input(s): LIPASE, AMYLASE in the last 168 hours. No results for input(s): AMMONIA in the last 168 hours. CBC: Recent Labs  Lab 10/16/20 1604 10/17/20 0447 10/17/20 0909 10/18/20 0531  WBC 17.6* 12.2* 12.5* 11.0*  NEUTROABS 15.2*  --   --   --   HGB 15.8* 14.4 14.6 13.2  HCT 48.1* 44.1 42.8 41.0  MCV 77.8* 77.4* 76.7* 77.7*  PLT 252 294 285 250   Cardiac Enzymes: Recent Labs  Lab 10/16/20 1604 10/17/20 0447 10/18/20 0531  CKTOTAL 10,037* 4,574* 2,503*   BNP: Invalid input(s): POCBNP CBG: Recent Labs  Lab 10/17/20 0543 10/17/20 0818 10/18/20 0520  GLUCAP 168* 125* 116*   D-Dimer No results for input(s): DDIMER in the last 72 hours. Hgb A1c Recent Labs    10/17/20 0447  HGBA1C 6.3*   Lipid Profile No results for input(s): CHOL, HDL, LDLCALC, TRIG, CHOLHDL, LDLDIRECT in the last 72 hours. Thyroid function studies Recent Labs    10/16/20 1604  TSH 0.730   Anemia work up No results for input(s): VITAMINB12, FOLATE, FERRITIN, TIBC, IRON, RETICCTPCT in the last 72 hours. Urinalysis    Component Value Date/Time   COLORURINE YELLOW (A) 06/26/2019 2000   APPEARANCEUR HAZY (A) 06/26/2019 2000   APPEARANCEUR Hazy 04/20/2012 1349   LABSPEC 1.013 06/26/2019 2000   LABSPEC 1.020 04/20/2012 1349   PHURINE 7.0 06/26/2019 2000   GLUCOSEU NEGATIVE 06/26/2019 2000   GLUCOSEU Negative 04/20/2012 1349   HGBUR NEGATIVE 06/26/2019 2000   BILIRUBINUR negative 05/14/2020 1538   BILIRUBINUR Negative  04/20/2012 1349   KETONESUR negative 05/14/2020 1538   KETONESUR NEGATIVE 06/26/2019 2000   PROTEINUR NEGATIVE 06/26/2019 2000   UROBILINOGEN 1.0 05/14/2020 1538   NITRITE Positive (A) 05/14/2020 1538   NITRITE NEGATIVE 06/26/2019 2000   LEUKOCYTESUR Negative 05/14/2020 1538   LEUKOCYTESUR TRACE (A) 06/26/2019 2000   LEUKOCYTESUR Negative 04/20/2012 1349   Sepsis Labs Invalid input(s): PROCALCITONIN,  WBC,  LACTICIDVEN Microbiology Recent Results (from the past 240 hour(s))  Resp Panel by RT-PCR (Flu A&B, Covid) Nasopharyngeal Swab     Status: None   Collection Time: 10/16/20  5:33 PM   Specimen: Nasopharyngeal Swab; Nasopharyngeal(NP) swabs in vial transport medium  Result Value Ref Range Status   SARS Coronavirus 2 by RT PCR NEGATIVE NEGATIVE Final    Comment: (NOTE) SARS-CoV-2 target nucleic acids are NOT DETECTED.  The SARS-CoV-2 RNA is generally detectable in upper respiratory specimens during the acute phase of infection. The lowest concentration of SARS-CoV-2 viral copies this assay can detect is 138 copies/mL. A negative result does not preclude SARS-Cov-2 infection and should not be used as the sole basis for treatment or other patient management decisions. A negative result may occur with  improper specimen collection/handling, submission of specimen other than nasopharyngeal swab, presence of viral mutation(s) within the areas targeted by this assay, and inadequate number of viral copies(<138 copies/mL). A negative result must be combined with clinical observations, patient history, and epidemiological information. The expected result  is Negative.  Fact Sheet for Patients:  EntrepreneurPulse.com.au  Fact Sheet for Healthcare Providers:  IncredibleEmployment.be  This test is no t yet approved or cleared by the [place] FDA and  has been authorized for detection and/or diagnosis of SARS-CoV-2 by FDA under an Emergency Use  Authorization (EUA). This EUA will remain  in effect (meaning this test can be used) for the duration of the COVID-19 declaration under Section 564(b)(1) of the Act, 21 U.S.C.section 360bbb-3(b)(1), unless the authorization is terminated  or revoked sooner.       Influenza A by PCR NEGATIVE NEGATIVE Final   Influenza B by PCR NEGATIVE NEGATIVE Final    Comment: (NOTE) The Xpert Xpress SARS-CoV-2/FLU/RSV plus assay is intended as an aid in the diagnosis of influenza from Nasopharyngeal swab specimens and should not be used as a sole basis for treatment. Nasal washings and aspirates are unacceptable for Xpert Xpress SARS-CoV-2/FLU/RSV testing.  Fact Sheet for Patients: EntrepreneurPulse.com.au  Fact Sheet for Healthcare Providers: IncredibleEmployment.be  This test is not yet approved or cleared by the [place] FDA and has been authorized for detection and/or diagnosis of SARS-CoV-2 by FDA under an Emergency Use Authorization (EUA). This EUA will remain in effect (meaning this test can be used) for the duration of the COVID-19 declaration under Section 564(b)(1) of the Act, 21 U.S.C. section 360bbb-3(b)(1), unless the authorization is terminated or revoked.  Performed at [facility], [address] [place], [place] [address]      Total time spend on discharging this patient, including the last patient exam, discussing the hospital stay, instructions for ongoing care as it relates to all pertinent caregivers, as well as preparing the medical discharge records, prescriptions, and/or referrals as applicable, is 35 minutes.    Enzo Bi, MD  [facility] 10/18/2020, 9:21 AM

## 2020-10-20 ENCOUNTER — Telehealth: Payer: Self-pay

## 2020-10-20 NOTE — Telephone Encounter (Signed)
Transition Care Management Unsuccessful Follow-up Telephone Call  Date of discharge and from where:  10/18/2020, [facility]  Attempts:  1st Attempt  Reason for unsuccessful TCM follow-up call:  Left voice message on # [phone].  Need to schedule hospital follow up appointment

## 2020-10-21 ENCOUNTER — Telehealth: Payer: Self-pay

## 2020-10-21 NOTE — Telephone Encounter (Signed)
Transition Care Management Unsuccessful Follow-up Telephone Call  Date of discharge and from where:  10/18/2020, [facility]  Attempts:  2nd Attempt  Reason for unsuccessful TCM follow-up call:  Left voice message on # [phone].  Call also placed to # [phone] and the recording stated that the call cannot be completed at this time   Patient has hospital follow up appointment with Dr Margarita Rana 11/19/2020.

## 2020-10-22 ENCOUNTER — Telehealth: Payer: Self-pay

## 2020-10-22 NOTE — Telephone Encounter (Signed)
From the discharge call:   She said she is sore and has been resting.   She said she has not reviewed the discharge instructions yet.  Instructed her to call this clinic if she has any questions about the instructions or medications after she reviews the information.  She said she does not have any medications. She needs to review the med list and order what is needed.   Scheduled to see Dr Margarita Rana  on 11/10/2020

## 2020-10-22 NOTE — Telephone Encounter (Signed)
Transition Care Management Follow-up Telephone Call Date of discharge and from where: 10/18/2020, [facility] How have you been since you were released from the hospital? She said she is sore and has been resting.  Any questions or concerns? No  Items Reviewed: Did the pt receive and understand the discharge instructions provided?  She said she has not reviewed them yet.  Instructed her to call this clinic if she has any questions about the instructions or medications after she reviews the information. Medications obtained and verified?  She said she does not have any medications. She needs to review the med list and order what is needed.  Other? No  Any new allergies since your discharge? No  Dietary orders reviewed? No Do you have support at home? Yes   Home Care and Equipment/Supplies: Were home health services ordered? no If so, what is the name of the agency? N/a  Has the agency set up a time to come to the patient's home? not applicable Were any new equipment or medical supplies ordered?  No What is the name of the medical supply agency? N/a Were you able to get the supplies/equipment? not applicable Do you have any questions related to the use of the equipment or supplies? No  Functional Questionnaire: (I = Independent and D = Dependent) ADLs: independent, has walker to use if needed   Follow up appointments reviewed:  PCP Hospital f/u appt confirmed? Yes  Scheduled to see Dr Margarita Rana  on 11/10/2020 @ 1050. [facility] f/u appt confirmed?  None scheduled at this time    Are transportation arrangements needed? No  If their condition worsens, is the pt aware to call PCP or go to the Emergency Dept.? Yes Was the patient provided with contact information for the PCP's office or ED? Yes Was to pt encouraged to call back with questions or concerns? Yes

## 2020-10-24 ENCOUNTER — Other Ambulatory Visit: Payer: Self-pay | Admitting: Family Medicine

## 2020-10-24 DIAGNOSIS — I10 Essential (primary) hypertension: Secondary | ICD-10-CM

## 2020-10-24 NOTE — Telephone Encounter (Signed)
Future visit in 2 weeks  

## 2020-11-03 ENCOUNTER — Other Ambulatory Visit: Payer: Self-pay | Admitting: Family Medicine

## 2020-11-03 DIAGNOSIS — R7303 Prediabetes: Secondary | ICD-10-CM

## 2020-11-10 ENCOUNTER — Ambulatory Visit: Payer: Medicaid Other | Admitting: Family Medicine

## 2020-11-19 ENCOUNTER — Inpatient Hospital Stay: Payer: Medicaid Other | Admitting: Family Medicine

## 2020-11-20 ENCOUNTER — Other Ambulatory Visit: Payer: Self-pay

## 2020-11-20 ENCOUNTER — Ambulatory Visit
Admission: RE | Admit: 2020-11-20 | Discharge: 2020-11-20 | Disposition: A | Discharge status: Home / Self Care | Payer: Medicaid Other | Source: Ambulatory Visit | Attending: Family Medicine | Admitting: Family Medicine

## 2020-11-20 DIAGNOSIS — Z1231 Encounter for screening mammogram for malignant neoplasm of breast: Secondary | ICD-10-CM

## 2021-01-23 ENCOUNTER — Ambulatory Visit: Payer: Self-pay

## 2021-01-23 ENCOUNTER — Other Ambulatory Visit: Payer: Self-pay | Admitting: Family Medicine

## 2021-01-23 DIAGNOSIS — J4520 Mild intermittent asthma, uncomplicated: Secondary | ICD-10-CM

## 2021-01-23 DIAGNOSIS — I10 Essential (primary) hypertension: Secondary | ICD-10-CM

## 2021-01-23 DIAGNOSIS — M5416 Radiculopathy, lumbar region: Secondary | ICD-10-CM

## 2021-01-23 NOTE — Telephone Encounter (Signed)
Patient called and she says she's been having chest pain off and on since Wednesday. She says the pain is a 9/10 and it's an aching pain. She says she's having SOB, nausea/vomiting this morning, dizziness and cough. Patient advised to go to the ED, care advice given, patient verbalized understanding.   Reason for Disposition  SEVERE chest pain  Answer Assessment - Initial Assessment Questions 1. LOCATION: "Where does it hurt?"       Left center under left breast 2. RADIATION: "Does the pain go anywhere else?" (e.g., into neck, jaw, arms, back)     Neck and back, left arm tingling/aching in left shoulder 3. ONSET: "When did the chest pain begin?" (Minutes, hours or days)      Wednesday 4. PATTERN "Does the pain come and go, or has it been constant since it started?"  "Does it get worse with exertion?"      Comes and go 5. DURATION: "How long does it last" (e.g., seconds, minutes, hours)      I don't know, it just lasts 6. SEVERITY: "How bad is the pain?"  (e.g., Scale 1-10; mild, moderate, or severe)    - MILD (1-3): doesn't interfere with normal activities     - MODERATE (4-7): interferes with normal activities or awakens from sleep    - SEVERE (8-10): excruciating pain, unable to do any normal activities       9 (aching) 7. CARDIAC RISK FACTORS: "Do you have any history of heart problems or risk factors for heart disease?" (e.g., angina, prior heart attack; diabetes, high blood pressure, high cholesterol, smoker, or strong family history of heart disease)     HTN, diabetes, smoker 8. PULMONARY RISK FACTORS: "Do you have any history of lung disease?"  (e.g., blood clots in lung, asthma, emphysema, birth control pills)     Asthma 9. CAUSE: "What do you think is causing the chest pain?"     No 10. OTHER SYMPTOMS: "Do you have any other symptoms?" (e.g., dizziness, nausea, vomiting, sweating, fever, difficulty breathing, cough)       Dizziness, nausea, vomiting, sweating, SOB, cough 11.  PREGNANCY: "Is there any chance you are pregnant?" "When was your last menstrual period?"       No  Protocols used: Chest Pain-A-AH

## 2021-01-24 NOTE — Telephone Encounter (Signed)
Pt needs appt for additional refills. ALl other med refilled

## 2021-01-24 NOTE — Telephone Encounter (Signed)
Attempted to call pt but VM full. Pt refill is due  Last RF 10/24/20 #30. Needs appt. Requested Prescriptions  Pending Prescriptions Disp Refills   amLODipine (NORVASC) 10 MG tablet [Pharmacy Med Name: amLODIPine Besylate 10 MG Oral Tablet] 30 tablet 0    Sig: Take 1 tablet by mouth once daily     Cardiovascular:  Calcium Channel Blockers Failed - 01/23/2021  1:57 PM      Failed - Last BP in normal range    BP Readings from Last 1 Encounters:  10/18/20 (!) 168/94          Failed - Valid encounter within last 6 months    Recent Outpatient Visits           8 months ago Annual physical exam   [place], Charlane Ferretti, MD   9 months ago Vaginal itching   [place] Ladell Pier, MD   9 months ago Exposure to COVID-19 virus   [place], [place]ong, MD   1 year ago Refractive errors   [place] Charlott Rakes, MD   2 years ago Lumbar radiculopathy   [place], Charlane Ferretti, MD              Signed Prescriptions Disp Refills   DULERA 200-5 MCG/ACT AERO 13 g 0    Sig: INHALE 2 PUFFS BY MOUTH IN THE MORNING AND AT BEDTIME     Pulmonology:  Combination Products Passed - 01/23/2021  1:57 PM      Passed - Valid encounter within last 12 months    Recent Outpatient Visits           8 months ago Annual physical exam   [place], [place], MD   9 months ago Vaginal itching   [place] Ladell Pier, MD   9 months ago Exposure to COVID-19 virus   [place], Charlane Ferretti, MD   1 year ago Refractive errors   [place], Charlane Ferretti, MD   2 years ago Lumbar radiculopathy   [place], [place], MD               cetirizine  (ZYRTEC) 10 MG tablet 30 tablet 0    Sig: Take 1 tablet by mouth once daily     Ear, Nose, and Throat:  Antihistamines Passed - 01/23/2021  1:57 PM      Passed - Valid encounter within last 12 months    Recent Outpatient Visits           8 months ago Annual physical exam   [place], [place], MD   9 months ago Vaginal itching   [place] Ladell Pier, MD   9 months ago Exposure to COVID-19 virus   [place] Charlott Rakes, MD   1 year ago Refractive errors   [place], Charlane Ferretti, MD   2 years ago Lumbar radiculopathy   [place], [place], MD               fluticasone [facility]) 50 MCG/ACT nasal spray 16 g 0    Sig: Use 2 spray(s) in each nostril  once daily     Ear, Nose, and Throat: Nasal Preparations - Corticosteroids Passed - 01/23/2021  1:57 PM      Passed - Valid encounter within last 12 months    Recent Outpatient Visits           8 months ago Annual physical exam   [place], [place], MD   9 months ago Vaginal itching   [place] Ladell Pier, MD   9 months ago Exposure to COVID-19 virus   [place] Charlott Rakes, MD   1 year ago Refractive errors   [place], Charlane Ferretti, MD   2 years ago Lumbar radiculopathy   [place], [place], MD               gabapentin (NEURONTIN) 300 MG capsule 120 capsule 0    Sig: Take 2 capsules by mouth twice daily     Neurology: Anticonvulsants - gabapentin Passed - 01/23/2021  1:57 PM      Passed - Valid encounter within last 12 months    Recent Outpatient Visits           8 months ago Annual physical exam   [place], [place], MD   9 months ago Vaginal itching   [place] Ladell Pier, MD   9 months ago Exposure to COVID-19 virus   [place], Charlane Ferretti, MD   1 year ago Refractive errors   [place], Charlane Ferretti, MD   2 years ago Lumbar radiculopathy   [place], Charlane Ferretti, MD               Potassium Chloride ER 20 MEQ TBCR 90 tablet 0    Sig: Take 1 tablet by mouth once daily     Endocrinology:  Minerals - Potassium Supplementation Failed - 01/23/2021  1:57 PM      Failed - K in normal range and within 360 days    Potassium  Date Value Ref Range Status  10/18/2020 3.4 (L) 3.5 - 5.1 mmol/L Final          Passed - Cr in normal range and within 360 days    Creatinine, Ser  Date Value Ref Range Status  10/18/2020 0.62 0.44 - 1.00 mg/dL Final          Passed - Valid encounter within last 12 months    Recent Outpatient Visits           8 months ago Annual physical exam   [place], Enobong, MD   9 months ago Vaginal itching   [place] Ladell Pier, MD   9 months ago Exposure to COVID-19 virus   [place], Enobong, MD   1 year ago Refractive errors   [place], Enobong, MD   2 years ago Lumbar radiculopathy   [facility] Charlott Rakes, MD

## 2021-01-26 NOTE — Telephone Encounter (Signed)
Pt called to check on medication. PT scheduled with next available with provider on 04/15/20. Pt refused a sooner appt with a different provider and is requesting to have medication to last her until then. Please advise.

## 2021-01-28 ENCOUNTER — Telehealth: Payer: Self-pay | Admitting: Family Medicine

## 2021-01-28 ENCOUNTER — Ambulatory Visit: Payer: Self-pay | Admitting: *Deleted

## 2021-01-28 NOTE — Telephone Encounter (Signed)
Medication Refill - Medication: albuterol solution  Has the patient contacted their pharmacy? No.  (Agent: If no, request that the patient contact the pharmacy for the refill. If patient does not wish to contact the pharmacy document the reason why and proceed with request.) Patient states its been awhile sense she has used her nebulizer and she is completley out.    Preferred Pharmacy (with phone number or street name):  [facility] [place] - [address] Phone:  [phone]  Fax:  [phone]      Has the patient been seen for an appointment in the last year OR does the patient have an upcoming appointment? Yes.    Agent: Please be advised that RX refills may take up to 3 business days. We ask that you follow-up with your pharmacy.

## 2021-01-28 NOTE — Telephone Encounter (Signed)
Reason for Disposition  [1] SEVERE pain AND [2] not improved 2 hours after taking analgesic medication (e.g., ibuprofen or acetaminophen)  Answer Assessment - Initial Assessment Questions 1. LOCATION: "Which ear is involved?"     Right ear  2. ONSET: "When did the ear start hurting"      Few days ago  3. SEVERITY: "How bad is the pain?"  (Scale 1-10; mild, moderate or severe)   - MILD (1-3): doesn't interfere with normal activities    - MODERATE (4-7): interferes with normal activities or awakens from sleep    - SEVERE (8-10): excruciating pain, unable to do any normal activities      Moderate  4. URI SYMPTOMS: "Do you have a runny nose or cough?"     Denies  5. FEVER: "Do you have a fever?" If Yes, ask: "What is your temperature, how was it measured, and when did it start?"     no 6. CAUSE: "Have you been swimming recently?", "How often do you use Q-TIPS?", "Have you had any recent air travel or scuba diving?"     No swimming. Used a Qtip and now ear painful  7. OTHER SYMPTOMS: "Do you have any other symptoms?" (e.g., headache, stiff neck, dizziness, vomiting, runny nose, decreased hearing)     Headache, dizziness this am .  8. PREGNANCY: "Is there any chance you are pregnant?" "When was your last menstrual period?"     na  Protocols used: Bethann Punches

## 2021-01-28 NOTE — Telephone Encounter (Signed)
Patient needs new Rx for albuterol nebulizer solution.

## 2021-01-28 NOTE — Telephone Encounter (Signed)
Patient experiencing right ear pain for a few days requesting antibiotics, patient states she knows its infected and would like an antibiotic only.   Called patient to review symptoms. Patient c/o right ear pain for a few days. Reports [place] the [place] and started having issues with ear and breathing. C/o right ear started itching and she tried to clean it with a Q-tip and ear is now irritated and sore. Constant throbbing and itching. Sore to touch area around right ear and face. Odor noted from ear and requesting antibiotics. Patient also requesting medication for nebulizer treatments. Patient reports she has been experiencing congestion and requiring to use nebulizer treatment more often. Denies difficulty breathing but has noticed she needs to wake up early morning hours and use nebulizer to breath better. No nebulizer medication noted on med list. Please advise . Patient reports she is not able to come in for appt at this time due to family dynamics and she is keeping her grandchild due to daughter in hospital. Care advise given. Patient verbalized understanding of care advise and to call back or go to ED if symptoms worsen. Patient declined appt or ED at this time.

## 2021-01-30 MED ORDER — ALBUTEROL SULFATE (2.5 MG/3ML) 0.083% IN NEBU: 2.5000 mg | INHALATION_SOLUTION | Freq: Four times a day (QID) | RESPIRATORY_TRACT | 2 refills | Status: DC | PRN

## 2021-01-30 NOTE — Telephone Encounter (Signed)
Refill nebulizer treatments.  States she is breathing fine but has congestion and urinary sx's  Advised if sx progressed to go to UC.  Scheduled apt for Monday 02/02/2021 at 10:50.

## 2021-01-30 NOTE — Telephone Encounter (Signed)
Rx sent 

## 2021-01-31 NOTE — Telephone Encounter (Signed)
Spoke with pt stated she has upcoming scheduled appt to f/u on below symptoms.

## 2021-01-31 NOTE — Telephone Encounter (Signed)
Called pt made aware of RX . Stated had picked it up and felling much better

## 2021-02-02 ENCOUNTER — Other Ambulatory Visit: Payer: Self-pay

## 2021-02-02 ENCOUNTER — Ambulatory Visit: Payer: Medicaid Other | Attending: Family Medicine | Admitting: Family Medicine

## 2021-02-02 ENCOUNTER — Encounter: Payer: Self-pay | Admitting: Family Medicine

## 2021-02-02 VITALS — BP 151/94 | HR 86 | Ht 64.0 in | Wt 235.0 lb

## 2021-02-02 DIAGNOSIS — F332 Major depressive disorder, recurrent severe without psychotic features: Secondary | ICD-10-CM | POA: Diagnosis not present

## 2021-02-02 DIAGNOSIS — I10 Essential (primary) hypertension: Secondary | ICD-10-CM

## 2021-02-02 DIAGNOSIS — R7303 Prediabetes: Secondary | ICD-10-CM

## 2021-02-02 DIAGNOSIS — H9203 Otalgia, bilateral: Secondary | ICD-10-CM

## 2021-02-02 DIAGNOSIS — J4520 Mild intermittent asthma, uncomplicated: Secondary | ICD-10-CM

## 2021-02-02 DIAGNOSIS — N3000 Acute cystitis without hematuria: Secondary | ICD-10-CM

## 2021-02-02 DIAGNOSIS — M5416 Radiculopathy, lumbar region: Secondary | ICD-10-CM

## 2021-02-02 LAB — POCT URINALYSIS DIP (CLINITEK)
Bilirubin, UA: NEGATIVE
Glucose, UA: NEGATIVE mg/dL
Ketones, POC UA: NEGATIVE mg/dL
Nitrite, UA: NEGATIVE
POC PROTEIN,UA: 100 — AB
Spec Grav, UA: 1.025 (ref 1.010–1.025)
Urobilinogen, UA: 1 E.U./dL
pH, UA: 6.5 (ref 5.0–8.0)

## 2021-02-02 MED ORDER — LOSARTAN POTASSIUM 50 MG PO TABS: 50.0000 mg | ORAL_TABLET | Freq: Every day | ORAL | 1 refills | Status: DC

## 2021-02-02 MED ORDER — NITROFURANTOIN MONOHYD MACRO 100 MG PO CAPS: 100.0000 mg | ORAL_CAPSULE | Freq: Two times a day (BID) | ORAL | 0 refills | Status: DC

## 2021-02-02 MED ORDER — CIPROFLOXACIN-DEXAMETHASONE 0.3-0.1 % OT SUSP: 4.0000 [drp] | Freq: Two times a day (BID) | OTIC | 0 refills | Status: DC

## 2021-02-02 MED ORDER — DULERA 200-5 MCG/ACT IN AERO: INHALATION_SPRAY | RESPIRATORY_TRACT | 3 refills | Status: DC

## 2021-02-02 MED ORDER — GABAPENTIN 300 MG PO CAPS: 600.0000 mg | ORAL_CAPSULE | Freq: Two times a day (BID) | ORAL | 3 refills | Status: DC

## 2021-02-02 MED ORDER — MISC. DEVICES MISC: 0 refills | Status: AC

## 2021-02-02 MED ORDER — METFORMIN HCL 500 MG PO TABS: 500.0000 mg | ORAL_TABLET | Freq: Two times a day (BID) | ORAL | 1 refills | Status: DC

## 2021-02-02 MED ORDER — FLUTICASONE PROPIONATE 50 MCG/ACT NA SUSP: NASAL | 3 refills | Status: AC

## 2021-02-02 NOTE — Progress Notes (Signed)
Subjective:  Patient ID: Kelly Byrd, female    DOB: 06/17/1975  Age: 45 y.o. MRN: [id number]  CC: Asthma   HPI Kelly Byrd is a 45 y.o. year old female with a history of hypertension, asthma, migraine, anemia (secondary to menorrhagia from fibroids), chronic low back pain from lumbar degenerative disc disease, slipped capital femoral epiphysis.  Interval History: She has had pressure on urination for the last 4 days with no nausea, vomiting and she has not used any OTC medications. She feels like she has something in her ears and has ben digging in her ears and has been digging in her ears. She does have some sinus congestion and has used OTC medications for it. She informs me she thinks it is 'her nerves'. Today is the 5-year anniversary of her son's passing. Currently stressed as she has another son in jail and her only other child has been in and out of the hospital for the last several months due to sickle cell crisis and has been home barely 2 weeks.  She is having to care for her 78-year-old granddaughter. Depression is closely followed by the ACT team who come to her house every day.  Her major source of support is her sister. Currently undergoing separation from her husband.  She previously had PCS services but she moved out into [facility] and since then has not had any.  She endorses falling and had to be seen in [facility] 4 months ago. Her cane does not provide enough support and she would like Walker with seat. She does have chronic lumbar radiculopathy which radiates down both lower extremities.  She goes to the pain management clinic. Her asthma is stable. Past Medical History:  Diagnosis Date   Asthma    Back pain    Chronic kidney disease    frequent infrections   Depression    Dislocation of hip (HCC)    right hip   GERD (gastroesophageal reflux disease)    Hypertension    Migraines    Pre-diabetes    Sickle  cell trait ([place])     Past Surgical History:  Procedure Laterality Date   CESAREAN SECTION     2   CYSTOSCOPY N/A 10/23/2019   Procedure: CYSTOSCOPY;  Surgeon: Malachy Mood, MD;  Location: [facility] ORS;  Service: Gynecology;  Laterality: N/A;   TOTAL LAPAROSCOPIC HYSTERECTOMY WITH SALPINGECTOMY Bilateral 10/23/2019   Procedure: TOTAL LAPAROSCOPIC HYSTERECTOMY CONVERTED TO OPEN SUPRACERVICAL HYSTERECTOMY;  Surgeon: Malachy Mood, MD;  Location: [facility] ORS;  Service: Gynecology;  Laterality: Bilateral;   TUBAL LIGATION     upper teeth      Family History  Problem Relation Age of Onset   Hypertension Mother    Hypertension Father    Breast cancer Maternal Grandmother    Diabetes Mellitus II Maternal Grandmother    Breast cancer Paternal Grandmother    Lupus Other     Allergies  Allergen Reactions   Ace Inhibitors Swelling    Throat and face   Lisinopril Other (See Comments)    Swelling--caused the pt to stay in the hospital   Peanuts [Peanut Oil] Other (See Comments)    Swelling--caused the pt to stay in the hospital    Outpatient Medications Prior to Visit  Medication Sig Dispense Refill   albuterol (PROVENTIL) (2.5 MG/3ML) 0.083% nebulizer solution Take 3 mLs (2.5 mg total) by nebulization every 6 (six) hours as needed for wheezing or shortness of breath (wheezing). 75 mL  2   amLODipine (NORVASC) 10 MG tablet Take 1 tablet by mouth once daily 30 tablet 2   atorvastatin (LIPITOR) 20 MG tablet Take 1 tablet (20 mg total) by mouth daily. 30 tablet 3   baclofen (LIORESAL) 10 MG tablet Take 10 mg by mouth in the morning and at bedtime.      celecoxib (CELEBREX) 100 MG capsule Take 1 capsule (100 mg total) by mouth daily as needed.     cetirizine (ZYRTEC) 10 MG tablet Take 1 tablet by mouth once daily 30 tablet 0   diclofenac Sodium (VOLTAREN) 1 % GEL Apply 1 application topically 4 (four) times daily as needed (pain.).      EPINEPHrine 0.3 mg/0.3 mL IJ SOAJ injection Inject 0.3  mLs (0.3 mg total) into the muscle as needed for anaphylaxis. 2 each 2   montelukast (SINGULAIR) 10 MG tablet Take 1 tablet (10 mg total) by mouth at bedtime. 30 tablet 3   oxyCODONE-acetaminophen (PERCOCET) 7.5-325 MG tablet Take 1 tablet by mouth every 8 (eight) hours as needed (pain.).      Potassium Chloride ER 20 MEQ TBCR Take 1 tablet by mouth once daily 90 tablet 0   QUEtiapine (SEROQUEL) 200 MG tablet Take 200 mg by mouth at bedtime.     sertraline (ZOLOFT) 100 MG tablet Take 100 mg by mouth daily.     SUMAtriptan (IMITREX) 100 MG tablet 100 mg orally at the onset of a migraine, may repeat in 2 hours if headache persists or recurs. Max 200mg  in 24 hrs 30 tablet 1   topiramate (TOPAMAX) 50 MG tablet Take 1 tablet (50 mg total) by mouth 2 (two) times daily.     traZODone (DESYREL) 100 MG tablet Take 1 tablet (100 mg total) by mouth at bedtime. 30 tablet 0   VENTOLIN HFA 108 (90 Base) MCG/ACT inhaler INHALE 2 PUFFS BY MOUTH EVERY 6 HOURS AS NEEDED FOR WHEEZING OR SHORTNESS OF BREATH 18 g 0   DULERA 200-5 MCG/ACT AERO INHALE 2 PUFFS BY MOUTH IN THE MORNING AND AT BEDTIME 13 g 0   fluticasone (FLONASE) 50 MCG/ACT nasal spray Use 2 spray(s) in each nostril once daily 16 g 0   gabapentin (NEURONTIN) 300 MG capsule Take 2 capsules by mouth twice daily 120 capsule 0   metFORMIN (GLUCOPHAGE) 500 MG tablet TAKE 1 TABLET BY MOUTH TWICE DAILY WITH A MEAL 180 tablet 0   famotidine (PEPCID) 20 MG tablet Take 1 tablet (20 mg total) by mouth 2 (two) times daily. 60 tablet 0   No facility-administered medications prior to visit.     ROS Review of Systems  Constitutional:  Negative for activity change, appetite change and fatigue.  HENT:  Positive for ear pain. Negative for congestion, sinus pressure and sore throat.   Eyes:  Negative for visual disturbance.  Respiratory:  Negative for cough, chest tightness, shortness of breath and wheezing.   Cardiovascular:  Negative for chest pain and  palpitations.  Gastrointestinal:  Negative for abdominal distention, abdominal pain and constipation.  Endocrine: Negative for polydipsia.  Genitourinary:  Positive for dysuria. Negative for frequency.  Musculoskeletal:  Positive for back pain. Negative for arthralgias.  Skin:  Negative for rash.  Neurological:  Negative for tremors, light-headedness and numbness.  Hematological:  Does not bruise/bleed easily.  Psychiatric/Behavioral:  Positive for dysphoric mood. Negative for agitation and behavioral problems.    Objective:  BP (!) 151/94   Pulse 86   Ht 5\' 4"  (1.626 m)  Wt 235 lb (106.6 kg)   LMP 10/04/2019   SpO2 98%   BMI 40.34 kg/m   BP/Weight 02/02/2021 10/18/2020 09/22/9483  Systolic BP 462 703 500  Diastolic BP 94 94 83  Wt. (Lbs) 235 242.73 236.2  BMI 40.34 41.66 40.54  Some encounter information is confidential and restricted. Go to Review Flowsheets activity to see all data.      Physical Exam Constitutional:      Appearance: She is well-developed. She is obese.  HENT:     Right Ear: Tympanic membrane normal.     Left Ear: Tympanic membrane normal.     Ears:     Comments: Slight right trageal tenderness Cardiovascular:     Rate and Rhythm: Normal rate.     Heart sounds: Normal heart sounds. No murmur heard. Pulmonary:     Effort: Pulmonary effort is normal.     Breath sounds: Normal breath sounds. No wheezing or rales.  Chest:     Chest wall: No tenderness.  Abdominal:     General: Bowel sounds are normal. There is no distension.     Palpations: Abdomen is soft. There is no mass.     Tenderness: There is no abdominal tenderness.  Musculoskeletal:     Right lower leg: No edema.     Left lower leg: No edema.     Comments: Tenderness on range of motion of spine Positive straight leg raise bilaterally  Neurological:     Mental Status: She is alert and oriented to person, place, and time.  Psychiatric:     Comments: Dysphoric mood    CMP Latest Ref Rng  & Units 10/18/2020 10/17/2020 10/16/2020  Glucose 70 - 99 mg/dL 113(H) 123(H) 138(H)  BUN 6 - 20 mg/dL 10 12 14   Creatinine 0.44 - 1.00 mg/dL 0.62 0.76 0.85  Sodium 135 - 145 mmol/L 138 136 134(L)  Potassium 3.5 - 5.1 mmol/L 3.4(L) 3.8 3.8  Chloride 98 - 111 mmol/L 107 103 101  CO2 22 - 32 mmol/L 26 23 24   Calcium 8.9 - 10.3 mg/dL 8.1(L) 8.5(L) 8.8(L)  Total Protein 6.5 - 8.1 g/dL - - 7.2  Total Bilirubin 0.3 - 1.2 mg/dL - - 0.9  Alkaline Phos 38 - 126 U/L - - 76  AST 15 - 41 U/L - - 149(H)  ALT 0 - 44 U/L - - 67(H)    Lipid Panel     Component Value Date/Time   CHOL 177 10/14/2017 0619   CHOL 193 08/06/2016 0840   TRIG 106 10/14/2017 0619   HDL 45 10/14/2017 0619   HDL 44 08/06/2016 0840   CHOLHDL 3.9 10/14/2017 0619   VLDL 21 10/14/2017 0619   LDLCALC 111 (H) 10/14/2017 0619   LDLCALC 132 (H) 08/06/2016 0840    CBC    Component Value Date/Time   WBC 11.0 (H) 10/18/2020 0531   RBC 5.28 (H) 10/18/2020 0531   HGB 13.2 10/18/2020 0531   HCT 41.0 10/18/2020 0531   PLT 250 10/18/2020 0531   MCV 77.7 (L) 10/18/2020 0531   MCH 25.0 (L) 10/18/2020 0531   MCHC 32.2 10/18/2020 0531   RDW 16.9 (H) 10/18/2020 0531   LYMPHSABS 1.5 10/16/2020 1604   MONOABS 0.6 10/16/2020 1604   EOSABS 0.1 10/16/2020 1604   BASOSABS 0.0 10/16/2020 1604    Lab Results  Component Value Date   HGBA1C 6.3 (H) 10/17/2020    Assessment & Plan:  1. Essential hypertension Uncontrolled Currently on amlodipine Losartan added to  regimen Will consider discontinuing potassium supplementation as hypokalemia can be corrected by her losartan - losartan (COZAAR) 50 MG tablet; Take 1 tablet (50 mg total) by mouth daily.  Dispense: 90 tablet; Refill: 1  2. Otalgia of both ears Unremarkable on exam though but she does have slight R tragal tenderness Will place on Ciprodex Advised against introducing foreign objects into her ears - ciprofloxacin-dexamethasone (CIPRODEX) OTIC suspension; Place 4 drops into  both ears 2 (two) times daily.  Dispense: 7.5 mL; Refill: 0  3. Acute cystitis without hematuria - nitrofurantoin, macrocrystal-monohydrate, (MACROBID) 100 MG capsule; Take 1 capsule (100 mg total) by mouth 2 (two) times daily.  Dispense: 10 capsule; Refill: 0 - Urine Culture - POCT URINALYSIS DIP (CLINITEK)  4. Major depressive disorder, recurrent severe without psychotic features ([place]) Worsened by anniversary of her son's passing She does have underlying psychosocial stressors Followed by psych and the ACT team  5. Lumbar radiculopathy Uncontrolled Prescription for walker PCS services Followed by pain management - Misc. Devices MISC; Rolling walker with seat  Dispense: 1 each; Refill: 0 - gabapentin (NEURONTIN) 300 MG capsule; Take 2 capsules (600 mg total) by mouth 2 (two) times daily.  Dispense: 120 capsule; Refill: 3  6. Mild intermittent asthma without complication Stable - mometasone-formoterol (DULERA) 200-5 MCG/ACT AERO; INHALE 2 PUFFS BY MOUTH IN THE MORNING AND AT BEDTIME  Dispense: 13 g; Refill: 3  7. Prediabetes Last A1c was 6.3 Will check at next visit - metFORMIN (GLUCOPHAGE) 500 MG tablet; Take 1 tablet (500 mg total) by mouth 2 (two) times daily with a meal.  Dispense: 180 tablet; Refill: 1   Meds ordered this encounter  Medications   nitrofurantoin, macrocrystal-monohydrate, (MACROBID) 100 MG capsule    Sig: Take 1 capsule (100 mg total) by mouth 2 (two) times daily.    Dispense:  10 capsule    Refill:  0   losartan (COZAAR) 50 MG tablet    Sig: Take 1 tablet (50 mg total) by mouth daily.    Dispense:  90 tablet    Refill:  1   ciprofloxacin-dexamethasone (CIPRODEX) OTIC suspension    Sig: Place 4 drops into both ears 2 (two) times daily.    Dispense:  7.5 mL    Refill:  0   Misc. Devices MISC    Sig: Rolling walker with seat    Dispense:  1 each    Refill:  0   mometasone-formoterol (DULERA) 200-5 MCG/ACT AERO    Sig: INHALE 2 PUFFS BY MOUTH IN THE  MORNING AND AT BEDTIME    Dispense:  13 g    Refill:  3   fluticasone (FLONASE) 50 MCG/ACT nasal spray    Sig: Use 2 spray(s) in each nostril once daily    Dispense:  16 g    Refill:  3   gabapentin (NEURONTIN) 300 MG capsule    Sig: Take 2 capsules (600 mg total) by mouth 2 (two) times daily.    Dispense:  120 capsule    Refill:  3   metFORMIN (GLUCOPHAGE) 500 MG tablet    Sig: Take 1 tablet (500 mg total) by mouth 2 (two) times daily with a meal.    Dispense:  180 tablet    Refill:  1    Follow-up: Return for Medical conditions, keep previously scheduled appointment.       Charlott Rakes, MD, FAAFP. [facility] [place], [place]   02/02/2021, 1:22 PM

## 2021-02-02 NOTE — Progress Notes (Signed)
Pain in both ears. UTI symptoms.

## 2021-02-10 ENCOUNTER — Other Ambulatory Visit: Payer: Self-pay | Admitting: Family Medicine

## 2021-02-10 ENCOUNTER — Telehealth: Payer: Self-pay

## 2021-02-10 LAB — URINE CULTURE

## 2021-02-10 MED ORDER — CIPROFLOXACIN HCL 500 MG PO TABS: 500.0000 mg | ORAL_TABLET | Freq: Two times a day (BID) | ORAL | 0 refills | Status: AC

## 2021-02-10 NOTE — Telephone Encounter (Signed)
-----   Message from Charlott Rakes, MD sent at 02/10/2021  8:29 AM EST ----- Please inform her that her urine culture result is back and the bacteria is resistant to the antibiotic which she was prescribed at her visit.  I have sent a new prescription for ciprofloxacin to her pharmacy.  Thanks.

## 2021-02-10 NOTE — Telephone Encounter (Signed)
Patient name and DOB has been verified Patient was informed of lab results. Patient had no questions.  

## 2021-02-13 ENCOUNTER — Telehealth: Payer: Self-pay | Admitting: Family Medicine

## 2021-02-13 NOTE — Telephone Encounter (Signed)
Paper work has been faxed

## 2021-02-13 NOTE — Telephone Encounter (Signed)
Copied from [place] [phone]. Topic: General - Other >> Feb 13, 2021 10:11 AM Celene Kras wrote: Reason for CRM: Elmyra Ricks, from [facility], calling stating that they sent a form of medical necessity on 02/10/21. She is requesting to know if this has been received. Please advise   Paperwork from 02/10/21 placed in PCP box.

## 2021-04-15 ENCOUNTER — Ambulatory Visit: Payer: Medicaid Other | Attending: Family Medicine | Admitting: Family Medicine

## 2021-04-15 ENCOUNTER — Other Ambulatory Visit: Payer: Self-pay

## 2021-04-15 ENCOUNTER — Encounter: Payer: Self-pay | Admitting: Family Medicine

## 2021-04-15 VITALS — BP 141/83 | HR 89 | Ht 64.0 in | Wt 250.0 lb

## 2021-04-15 DIAGNOSIS — E876 Hypokalemia: Secondary | ICD-10-CM

## 2021-04-15 DIAGNOSIS — E1149 Type 2 diabetes mellitus with other diabetic neurological complication: Secondary | ICD-10-CM

## 2021-04-15 DIAGNOSIS — Z1159 Encounter for screening for other viral diseases: Secondary | ICD-10-CM

## 2021-04-15 DIAGNOSIS — M5416 Radiculopathy, lumbar region: Secondary | ICD-10-CM

## 2021-04-15 DIAGNOSIS — Z23 Encounter for immunization: Secondary | ICD-10-CM

## 2021-04-15 DIAGNOSIS — I152 Hypertension secondary to endocrine disorders: Secondary | ICD-10-CM

## 2021-04-15 DIAGNOSIS — R7303 Prediabetes: Secondary | ICD-10-CM

## 2021-04-15 DIAGNOSIS — M79642 Pain in left hand: Secondary | ICD-10-CM

## 2021-04-15 DIAGNOSIS — E1159 Type 2 diabetes mellitus with other circulatory complications: Secondary | ICD-10-CM

## 2021-04-15 DIAGNOSIS — M93001 Unspecified slipped upper femoral epiphysis (nontraumatic), right hip: Secondary | ICD-10-CM

## 2021-04-15 DIAGNOSIS — E1169 Type 2 diabetes mellitus with other specified complication: Secondary | ICD-10-CM

## 2021-04-15 DIAGNOSIS — M79641 Pain in right hand: Secondary | ICD-10-CM

## 2021-04-15 LAB — POCT GLYCOSYLATED HEMOGLOBIN (HGB A1C): HbA1c, POC (controlled diabetic range): 7.1 % — AB (ref 0.0–7.0)

## 2021-04-15 LAB — GLUCOSE, POCT (MANUAL RESULT ENTRY): POC Glucose: 153 mg/dl — AB (ref 70–99)

## 2021-04-15 MED ORDER — TRUE METRIX METER DEVI: 1.0000 | Freq: Three times a day (TID) | 0 refills | Status: AC

## 2021-04-15 MED ORDER — MELOXICAM 7.5 MG PO TABS: 7.5000 mg | ORAL_TABLET | Freq: Every day | ORAL | 1 refills | Status: DC

## 2021-04-15 MED ORDER — TRUE METRIX BLOOD GLUCOSE TEST VI STRP: 1.0000 | ORAL_STRIP | Freq: Three times a day (TID) | 12 refills | Status: AC

## 2021-04-15 MED ORDER — PREDNISONE 20 MG PO TABS: 20.0000 mg | ORAL_TABLET | Freq: Every day | ORAL | 0 refills | Status: DC

## 2021-04-15 MED ORDER — METFORMIN HCL 500 MG PO TABS: 1000.0000 mg | ORAL_TABLET | Freq: Two times a day (BID) | ORAL | 1 refills | Status: DC

## 2021-04-15 MED ORDER — TRUEPLUS LANCETS 28G MISC: 1.0000 | Freq: Three times a day (TID) | 11 refills | Status: AC

## 2021-04-15 NOTE — Patient Instructions (Signed)
Type 2 Diabetes Mellitus, Diagnosis, Adult °Type 2 diabetes (type 2 diabetes mellitus) is a long-term (chronic) disease. It may happen when there is one or both of these problems: °The pancreas does not make enough insulin. °The body does not react in a normal way to insulin that it makes. °Insulin lets sugars go into cells in your body. If you have type 2 diabetes, sugars cannot get into your cells. Sugars build up in the blood. This causes high blood sugar. °What are the causes? °The exact cause of this condition is not known. °What increases the risk? °Having type 2 diabetes in your family. °Being overweight or very overweight. °Not being active. °Your body not reacting in a normal way to the insulin it makes. °Having higher than normal blood sugar over time. °Having a type of diabetes when you were pregnant. °Having a condition that causes small fluid-filled sacs on your ovaries. °What are the signs or symptoms? °At first, you may have no symptoms. You will get symptoms slowly. They may include: °More thirst than normal. °More hunger than normal. °Needing to pee more than normal. °Losing weight without trying. °Feeling tired. °Feeling weak. °Seeing things blurry. °Dark patches on your skin. °How is this treated? °This condition may be treated by a diabetes expert. You may need to: °Follow an eating plan made by a food expert (dietitian). °Get regular exercise. °Find ways to deal with stress. °Check blood sugar as often as told. °Take medicines. °Your doctor will set treatment goals for you. Your blood sugar should be at these levels: °Before meals: 80-130 mg/dL (4.4-7.2 mmol/L). °After meals: below 180 mg/dL (10 mmol/L). °Over the last 2-3 months: less than 7%. °Follow these instructions at home: °Medicines °Take your diabetes medicines or insulin every day. °Take medicines as told to help you prevent other problems caused by this condition. You may need: °Aspirin. °Medicine to lower cholesterol. °Medicine to  control blood pressure. °Questions to ask your doctor °Should I meet with a diabetes educator? °What medicines do I need, and when should I take them? °What will I need to treat my condition at home? °When should I check my blood sugar? °Where can I find a support group? °Who can I call if I have questions? °When is my next doctor visit? °General instructions °Take over-the-counter and prescription medicines only as told by your doctor. °Keep all follow-up visits. °Where to find more information °For help and guidance and more information about diabetes, please go to: °American Diabetes Association (ADA): www.diabetes.org °American Association of Diabetes Care and Education Specialists (ADCES): www.diabeteseducator.org °International Diabetes Federation (IDF): www.idf.org °Contact a doctor if: °Your blood sugar is at or above 240 mg/dL (13.3 mmol/L) for 2 days in a row. °You have been sick for 2 days or more, and you are not getting better. °You have had a fever for 2 days or more, and you are not getting better. °You have any of these problems for more than 6 hours: °You cannot eat or drink. °You feel like you may vomit. °You vomit. °You have watery poop (diarrhea). °Get help right away if: °Your blood sugar is lower than 54 mg/dL (3 mmol/L). °You feel mixed up (confused). °You have trouble thinking clearly. °You have trouble breathing. °You have medium or large ketone levels in your pee. °These symptoms may be an emergency. Get help right away. Call your local emergency services (911 in the [place] °Do not wait to see if the symptoms will go away. °Do not drive yourself   to the hospital. °Summary °Type 2 diabetes is a long-term disease. Your pancreas may not make enough insulin, or your body may not react in a normal way to insulin that it makes. °This condition is treated with an eating plan, lifestyle changes, and medicines. °Your doctor will set treatment goals for you. These will help you keep your blood sugar  in a healthy range. °Keep all follow-up visits. °This information is not intended to replace advice given to you by your health care provider. Make sure you discuss any questions you have with your health care provider. °Document Revised: 06/09/2020 Document Reviewed: 06/09/2020 °Elsevier Patient Education © 2022 [employer] ° °

## 2021-04-15 NOTE — Progress Notes (Signed)
Subjective:  Patient ID: Kelly Byrd, female    DOB: 1975-05-13  Age: 46 y.o. MRN: [id number]  CC: Diabetes   HPI Kelly Byrd is a 46 y.o. year old female with a history of hypertension, asthma, migraine, anemia (secondary to menorrhagia from fibroids), chronic low back pain from lumbar degenerative disc disease, slipped capital femoral epiphysis, type 2 diabetes mellitus (A1c 7.1).  Interval History: She has pain in her hands which are sharp and shooting. It is difficult to hold her walker, she is unable to fold her fingers to hold the steering wheel.  Currently on gabapentin. She is no longer going to the pain Clinic as her Psychiatrist advised her to come off her Pain medications which he said might have contributed towards her hospitalization 7 months ago in the ICU.  She is then discontinued all her pain medications. Today she is requesting referral to [facility] for evaluation of her hips as she thinks she might need surgery (she does have a history of slipped capital femoral epiphysis).  Her family members have had hip surgeries at [facility] and she would like to go there as well.  Also request pain management referral.  Her A1c is 7.1 which is up from 6.3 in 10/15/2020 when she had prediabetes.  She has remained on metformin all this while. Blood pressure has improved from her last visit at which time losartan has been added to her regimen.  She has also been on losartan and potassium as well. Request HIV screening as she thinks her husband may be cheating on her.  Past Medical History:  Diagnosis Date   Asthma    Back pain    Chronic kidney disease    frequent infrections   Depression    Dislocation of hip (HCC)    right hip   GERD (gastroesophageal reflux disease)    Hypertension    Migraines    Pre-diabetes    Sickle cell trait ([place])     Past Surgical History:  Procedure Laterality Date   CESAREAN SECTION     2   CYSTOSCOPY N/A  10/23/2019   Procedure: CYSTOSCOPY;  Surgeon: Malachy Mood, MD;  Location: [facility] ORS;  Service: Gynecology;  Laterality: N/A;   TOTAL LAPAROSCOPIC HYSTERECTOMY WITH SALPINGECTOMY Bilateral 10/23/2019   Procedure: TOTAL LAPAROSCOPIC HYSTERECTOMY CONVERTED TO OPEN SUPRACERVICAL HYSTERECTOMY;  Surgeon: Malachy Mood, MD;  Location: [facility] ORS;  Service: Gynecology;  Laterality: Bilateral;   TUBAL LIGATION     upper teeth      Family History  Problem Relation Age of Onset   Hypertension Mother    Hypertension Father    Breast cancer Maternal Grandmother    Diabetes Mellitus II Maternal Grandmother    Breast cancer Paternal Grandmother    Lupus Other     Allergies  Allergen Reactions   Ace Inhibitors Swelling    Throat and face   Lisinopril Other (See Comments)    Swelling--caused the pt to stay in the hospital   Peanuts [Peanut Oil] Other (See Comments)    Swelling--caused the pt to stay in the hospital    Outpatient Medications Prior to Visit  Medication Sig Dispense Refill   albuterol (PROVENTIL) (2.5 MG/3ML) 0.083% nebulizer solution Take 3 mLs (2.5 mg total) by nebulization every 6 (six) hours as needed for wheezing or shortness of breath (wheezing). 75 mL 2   amLODipine (NORVASC) 10 MG tablet Take 1 tablet by mouth once daily 30 tablet 2   atorvastatin (LIPITOR)  20 MG tablet Take 1 tablet (20 mg total) by mouth daily. 30 tablet 3   baclofen (LIORESAL) 10 MG tablet Take 10 mg by mouth in the morning and at bedtime.      celecoxib (CELEBREX) 100 MG capsule Take 1 capsule (100 mg total) by mouth daily as needed.     cetirizine (ZYRTEC) 10 MG tablet Take 1 tablet by mouth once daily 30 tablet 0   ciprofloxacin-dexamethasone (CIPRODEX) OTIC suspension Place 4 drops into both ears 2 (two) times daily. 7.5 mL 0   diclofenac Sodium (VOLTAREN) 1 % GEL Apply 1 application topically 4 (four) times daily as needed (pain.).      EPINEPHrine 0.3 mg/0.3 mL IJ SOAJ injection Inject 0.3 mLs  (0.3 mg total) into the muscle as needed for anaphylaxis. 2 each 2   fluticasone (FLONASE) 50 MCG/ACT nasal spray Use 2 spray(s) in each nostril once daily 16 g 3   gabapentin (NEURONTIN) 300 MG capsule Take 2 capsules (600 mg total) by mouth 2 (two) times daily. 120 capsule 3   losartan (COZAAR) 50 MG tablet Take 1 tablet (50 mg total) by mouth daily. 90 tablet 1   Misc. Devices MISC Rolling walker with seat 1 each 0   mometasone-formoterol (DULERA) 200-5 MCG/ACT AERO INHALE 2 PUFFS BY MOUTH IN THE MORNING AND AT BEDTIME 13 g 3   montelukast (SINGULAIR) 10 MG tablet Take 1 tablet (10 mg total) by mouth at bedtime. 30 tablet 3   nitrofurantoin, macrocrystal-monohydrate, (MACROBID) 100 MG capsule Take 1 capsule (100 mg total) by mouth 2 (two) times daily. 10 capsule 0   oxyCODONE-acetaminophen (PERCOCET) 7.5-325 MG tablet Take 1 tablet by mouth every 8 (eight) hours as needed (pain.).      Potassium Chloride ER 20 MEQ TBCR Take 1 tablet by mouth once daily 90 tablet 0   QUEtiapine (SEROQUEL) 200 MG tablet Take 200 mg by mouth at bedtime.     sertraline (ZOLOFT) 100 MG tablet Take 100 mg by mouth daily.     SUMAtriptan (IMITREX) 100 MG tablet 100 mg orally at the onset of a migraine, may repeat in 2 hours if headache persists or recurs. Max 200mg  in 24 hrs 30 tablet 1   topiramate (TOPAMAX) 50 MG tablet Take 1 tablet (50 mg total) by mouth 2 (two) times daily.     traZODone (DESYREL) 100 MG tablet Take 1 tablet (100 mg total) by mouth at bedtime. 30 tablet 0   VENTOLIN HFA 108 (90 Base) MCG/ACT inhaler INHALE 2 PUFFS BY MOUTH EVERY 6 HOURS AS NEEDED FOR WHEEZING OR SHORTNESS OF BREATH 18 g 0   metFORMIN (GLUCOPHAGE) 500 MG tablet Take 1 tablet (500 mg total) by mouth 2 (two) times daily with a meal. 180 tablet 1   famotidine (PEPCID) 20 MG tablet Take 1 tablet (20 mg total) by mouth 2 (two) times daily. 60 tablet 0   No facility-administered medications prior to visit.     ROS Review of Systems   Constitutional:  Negative for activity change, appetite change and fatigue.  HENT:  Negative for congestion, sinus pressure and sore throat.   Eyes:  Negative for visual disturbance.  Respiratory:  Negative for cough, chest tightness, shortness of breath and wheezing.   Cardiovascular:  Negative for chest pain and palpitations.  Gastrointestinal:  Negative for abdominal distention, abdominal pain and constipation.  Endocrine: Negative for polydipsia.  Genitourinary:  Negative for dysuria and frequency.  Musculoskeletal:  See HPI  Skin:  Negative for rash.  Neurological:  Negative for tremors, light-headedness and numbness.  Hematological:  Does not bruise/bleed easily.  Psychiatric/Behavioral:  Negative for agitation and behavioral problems.    Objective:  BP (!) 141/83    Pulse 89    Ht 5\' 4"  (1.626 m)    Wt 250 lb (113.4 kg)    LMP 10/04/2019    SpO2 100%    BMI 42.91 kg/m   BP/Weight 04/15/2021 02/02/2021 5/97/4163  Systolic BP 845 364 680  Diastolic BP 83 94 94  Wt. (Lbs) 250 235 242.73  BMI 42.91 40.34 41.66  Some encounter information is confidential and restricted. Go to Review Flowsheets activity to see all data.      Physical Exam Constitutional:      Appearance: She is well-developed.  Cardiovascular:     Rate and Rhythm: Normal rate.     Heart sounds: Normal heart sounds. No murmur heard. Pulmonary:     Effort: Pulmonary effort is normal.     Breath sounds: Normal breath sounds. No wheezing or rales.  Chest:     Chest wall: No tenderness.  Abdominal:     General: Bowel sounds are normal. There is no distension.     Palpations: Abdomen is soft. There is no mass.     Tenderness: There is no abdominal tenderness.  Musculoskeletal:     Right lower leg: No edema.     Left lower leg: No edema.     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  Psychiatric:        Mood and Affect: Mood normal.    CMP Latest Ref Rng & Units 10/18/2020 10/17/2020 10/16/2020  Glucose 70 - 99 mg/dL 113(H) 123(H) 138(H)  BUN 6 - 20 mg/dL 10 12 14   Creatinine 0.44 - 1.00 mg/dL 0.62 0.76 0.85  Sodium 135 - 145 mmol/L 138 136 134(L)  Potassium 3.5 - 5.1 mmol/L 3.4(L) 3.8 3.8  Chloride 98 - 111 mmol/L 107 103 101  CO2 22 - 32 mmol/L 26 23 24   Calcium 8.9 - 10.3 mg/dL 8.1(L) 8.5(L) 8.8(L)  Total Protein 6.5 - 8.1 g/dL - - 7.2  Total Bilirubin 0.3 - 1.2 mg/dL - - 0.9  Alkaline Phos 38 - 126 U/L - - 76  AST 15 - 41 U/L - - 149(H)  ALT 0 - 44 U/L - - 67(H)    Lipid Panel     Component Value Date/Time   CHOL 177 10/14/2017 0619   CHOL 193 08/06/2016 0840   TRIG 106 10/14/2017 0619   HDL 45 10/14/2017 0619   HDL 44 08/06/2016 0840   CHOLHDL 3.9 10/14/2017 0619   VLDL 21 10/14/2017 0619   LDLCALC 111 (H) 10/14/2017 0619   LDLCALC 132 (H) 08/06/2016 0840    CBC    Component Value Date/Time   WBC 11.0 (H) 10/18/2020 0531   RBC 5.28 (H) 10/18/2020 0531   HGB 13.2 10/18/2020 0531   HCT 41.0 10/18/2020 0531   PLT 250 10/18/2020 0531   MCV 77.7 (L) 10/18/2020 0531   MCH 25.0 (L) 10/18/2020 0531   MCHC 32.2 10/18/2020 0531   RDW 16.9 (H) 10/18/2020 0531   LYMPHSABS 1.5 10/16/2020 1604   MONOABS 0.6 10/16/2020 1604   EOSABS 0.1 10/16/2020 1604  BASOSABS 0.0 10/16/2020 1604    Lab Results  Component Value Date   HGBA1C 7.1 (A) 04/15/2021    Assessment & Plan:  1. Type 2 diabetes mellitus with other specified complication, without long-term current use of insulin (HCC) New diagnosis with A1c of 7.1 up from 6.3 previously Increase metformin dose from 500 mg twice daily to 1000 mg twice daily Counseled on Diabetic diet, my plate method, 071 minutes of moderate intensity exercise/week Blood sugar logs with fasting goals of 80-120 mg/dl, random of less than 180 and in the  event of sugars less than 60 mg/dl or greater than 400 mg/dl encouraged to notify the clinic. Advised on the need for annual eye exams, annual foot exams, Pneumonia vaccine. - POCT glucose (manual entry) - POCT glycosylated hemoglobin (Hb Q1F) - Basic Metabolic Panel - glucose blood (TRUE METRIX BLOOD GLUCOSE TEST) test strip; 1 each by Other route 3 (three) times daily.  Dispense: 100 each; Refill: 12 - Blood Glucose Monitoring Suppl (TRUE METRIX METER) DEVI; 1 each by Does not apply route 3 (three) times daily.  Dispense: 1 each; Refill: 0 - TRUEplus Lancets 28G MISC; 1 each by Does not apply route 3 (three) times daily.  Dispense: 100 each; Refill: 11  2. Hypokalemia Last potassium was 3.4 Will check potassium level again She is currently on both losartan and Klor-Con  3. Hypertension associated with diabetes ([place]) Minimally above goal We will make no regimen changes as this has improved compared to last set of labs Continue current management Counseled on blood pressure goal of less than 130/80, low-sodium, DASH diet, medication compliance, 150 minutes of moderate intensity exercise per week. Discussed medication compliance, adverse effects.  4. Other diabetic neurological complication associated with type 2 diabetes mellitus ([place]) She is currently on gabapentin which is ineffective Would love to add Cymbalta however she is already on Zoloft by mental health Placed on prednisone - metFORMIN (GLUCOPHAGE) 500 MG tablet; Take 2 tablets (1,000 mg total) by mouth 2 (two) times daily with a meal.  Dispense: 360 tablet; Refill: 1  5. Pain in both hands Will need to exclude autoimmune condition especially given deformity in middle fingers Short course of prednisone after which she will commence NSAID Will also need to exclude gout - predniSONE (DELTASONE) 20 MG tablet; Take 1 tablet (20 mg total) by mouth daily with breakfast.  Dispense: 5 tablet; Refill: 0 - meloxicam (MOBIC) 7.5 MG  tablet; Take 1 tablet (7.5 mg total) by mouth daily.  Dispense: 30 tablet; Refill: 1 - Uric Acid; Future - Sedimentation rate; Future - C-reactive protein; Future - Anti-Smith antibody; Future - Anti-DNA antibody, double-stranded; Future - ANA,IFA RA Diag Pnl w/rflx Tit/Patn; Future  6. Screening for viral disease Per patient request - HIV Antibody (routine testing w rflx); Future  7. 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 Lumbar radiculopathy Uncontrolled - Ambulatory referral to Pain Clinic  9. Need for pneumococcal vaccine - Pneumococcal conjugate vaccine 20-valent  10. Need for immunization against influenza - Flu Vaccine QUAD 5mo+IM (Fluarix, Fluzone & Alfiuria Quad PF)    Meds ordered this encounter  Medications   predniSONE (DELTASONE) 20 MG tablet    Sig: Take 1 tablet (20 mg total) by mouth daily with breakfast.    Dispense:  5 tablet    Refill:  0   meloxicam (MOBIC) 7.5 MG tablet    Sig: Take 1 tablet (7.5 mg  total) by mouth daily.    Dispense:  30 tablet    Refill:  1   metFORMIN (GLUCOPHAGE) 500 MG tablet    Sig: Take 2 tablets (1,000 mg total) by mouth 2 (two) times daily with a meal.    Dispense:  360 tablet    Refill:  1    Dose increase   glucose blood (TRUE METRIX BLOOD GLUCOSE TEST) test strip    Sig: 1 each by Other route 3 (three) times daily.    Dispense:  100 each    Refill:  12   Blood Glucose Monitoring Suppl (TRUE METRIX METER) DEVI    Sig: 1 each by Does not apply route 3 (three) times daily.    Dispense:  1 each    Refill:  0   TRUEplus Lancets 28G MISC    Sig: 1 each by Does not apply route 3 (three) times daily.    Dispense:  100 each    Refill:  11    Follow-up: Return in about 3 months (around 07/14/2021).    46 minutes of total face to face time spent including median intraservice time reviewing previous notes and test results, counseling patient  on diagnosis and work up of hand pain in addition to management of chronic medical conditions.Time also spent ordering medications, investigations and documenting in the chart.  All questions were answered to the patient's satisfaction    Charlott Rakes, MD, FAAFP. [facility] [place], [place]   04/15/2021, 5:21 PM

## 2021-04-16 ENCOUNTER — Ambulatory Visit: Payer: Medicaid Other | Attending: Family Medicine

## 2021-04-16 DIAGNOSIS — Z1159 Encounter for screening for other viral diseases: Secondary | ICD-10-CM

## 2021-04-16 DIAGNOSIS — M79641 Pain in right hand: Secondary | ICD-10-CM

## 2021-04-16 LAB — BASIC METABOLIC PANEL
BUN/Creatinine Ratio: 14 (ref 9–23)
BUN: 11 mg/dL (ref 6–24)
CO2: 23 mmol/L (ref 20–29)
Calcium: 9.6 mg/dL (ref 8.7–10.2)
Chloride: 106 mmol/L (ref 96–106)
Creatinine, Ser: 0.81 mg/dL (ref 0.57–1.00)
Glucose: 144 mg/dL — ABNORMAL HIGH (ref 70–99)
Potassium: 4.1 mmol/L (ref 3.5–5.2)
Sodium: 140 mmol/L (ref 134–144)
eGFR: 92 mL/min/{1.73_m2} (ref >59)

## 2021-04-17 ENCOUNTER — Other Ambulatory Visit: Payer: Self-pay | Admitting: Family Medicine

## 2021-04-17 DIAGNOSIS — I10 Essential (primary) hypertension: Secondary | ICD-10-CM

## 2021-04-17 LAB — HIV ANTIBODY (ROUTINE TESTING W REFLEX): HIV Screen 4th Generation wRfx: NONREACTIVE

## 2021-04-17 LAB — SEDIMENTATION RATE: Sed Rate: 29 mm/hr (ref 0–32)

## 2021-04-17 LAB — URIC ACID: Uric Acid: 6.4 mg/dL — ABNORMAL HIGH (ref 2.6–6.2)

## 2021-04-17 LAB — ANTI-DNA ANTIBODY, DOUBLE-STRANDED: dsDNA Ab: <1 IU/mL (ref 0–9)

## 2021-04-17 LAB — C-REACTIVE PROTEIN: CRP: 11 mg/L — ABNORMAL HIGH (ref 0–10)

## 2021-04-17 NOTE — Telephone Encounter (Signed)
Requested Prescriptions  Pending Prescriptions Disp Refills   amLODipine (NORVASC) 10 MG tablet [Pharmacy Med Name: amLODIPine Besylate 10 MG Oral Tablet] 90 tablet 0    Sig: Take 1 tablet by mouth once daily     Cardiovascular:  Calcium Channel Blockers Failed - 04/17/2021 10:43 AM      Failed - Last BP in normal range    BP Readings from Last 1 Encounters:  04/15/21 (!) 141/83         Passed - Valid encounter within last 6 months    Recent Outpatient Visits          2 days ago Type 2 diabetes mellitus with other specified complication, without long-term current use of insulin ([place])   [place], Charlane Ferretti, MD   2 months ago Otalgia of both [place], Charlane Ferretti, MD   11 months ago Annual physical exam   [place], Charlane Ferretti, MD   11 months ago Vaginal itching   [place] Ladell Pier, MD   1 year ago Exposure to COVID-19 virus   [place], Charlane Ferretti, MD      Future Appointments            In 3 months Charlott Rakes, MD [place]            cetirizine (ZYRTEC) 10 MG tablet [Pharmacy Med Name: Cetirizine HCl 10 MG Oral Tablet] 30 tablet 0    Sig: Take 1 tablet by mouth once daily     Ear, Nose, and Throat:  Antihistamines Passed - 04/17/2021 10:43 AM      Passed - Valid encounter within last 12 months    Recent Outpatient Visits          2 days ago Type 2 diabetes mellitus with other specified complication, without long-term current use of insulin ([employer])   [place], Charlane Ferretti, MD   2 months ago Otalgia of both ears   [place], [place], MD   11 months ago Annual physical exam   [place], Enobong, MD   11 months ago Vaginal itching    [place] Ladell Pier, MD   1 year ago Exposure to COVID-19 virus   [place], MD      Future Appointments            In 3 months Charlott Rakes, MD [place]

## 2021-04-20 ENCOUNTER — Other Ambulatory Visit: Payer: Self-pay | Admitting: Family Medicine

## 2021-04-20 MED ORDER — ALLOPURINOL 300 MG PO TABS: 300.0000 mg | ORAL_TABLET | Freq: Every day | ORAL | 6 refills | Status: DC

## 2021-04-21 LAB — ANA,IFA RA DIAG PNL W/RFLX TIT/PATN
ANA Titer 1: NEGATIVE
Cyclic Citrullin Peptide Ab: 3 units (ref 0–19)
Rheumatoid fact SerPl-aCnc: <10 IU/mL (ref <14.0)

## 2021-04-21 LAB — ANTI-SMITH ANTIBODY: ENA SM Ab Ser-aCnc: <0.2 AI (ref 0.0–0.9)

## 2021-04-22 ENCOUNTER — Telehealth: Payer: Self-pay

## 2021-04-22 NOTE — Telephone Encounter (Signed)
Patient name and DOB has been verified Patient was informed of lab results. Patient had no questions.  

## 2021-04-22 NOTE — Telephone Encounter (Signed)
-----   Message from Charlott Rakes, MD sent at 04/20/2021  8:18 AM EST ----- Please inform her that labs reveal negative HIV test, normal kidney and liver function, no lupus or autoimmune disorder but she tested positive for gout which could explain the symptoms in her hands.  I have sent a prescription for allopurinol to her pharmacy.

## 2021-04-23 ENCOUNTER — Ambulatory Visit: Payer: Self-pay

## 2021-04-23 NOTE — Telephone Encounter (Signed)
°  Chief Complaint: medication change request Symptoms: SE of metformin, upset stomach, nausea, diarrhea, dry mouth Frequency: every day Pertinent Negatives: NA Disposition: [] ED /[] Urgent Care (no appt availability in office) / [] Appointment(In office/virtual)/ []  [place] [facility]/ [] Home Care/ [] Refused Recommended Disposition /[] [place] [facility]/ [x]  Follow-up with PCP Additional Notes: Pt was seen on 04/15/21, states she is going to stop taking the metformin due to the SE she experiences and hasn't improved BS. She is asking for something to replace it. Attempted to schedule pt for tele visit but first avaiable 04/28/21 and pt wants to talk to someone today. Office is currently closed for lunch so advised her I will send to provider and we can follow up.    Reason for Disposition  [1] Caller has URGENT medicine question about med that PCP or specialist prescribed AND [2] triager unable to answer question  Answer Assessment - Initial Assessment Questions 1. NAME of MEDICATION: "What medicine are you calling about?"     metformin 2. QUESTION: "What is your question?" (e.g., double dose of medicine, side effect)     Wants something in place of metformin 3. PRESCRIBING HCP: "Who prescribed it?" Reason: if prescribed by specialist, call should be referred to that group.     Dr Margarita Rana 4. SYMPTOMS: "Do you have any symptoms?"     Upset stomach, nausea, diarrhea and dry mouth 5. SEVERITY: If symptoms are present, ask "Are they mild, moderate or severe?"     Mild to moderate 6. PREGNANCY:  "Is there any chance that you are pregnant?" "When was your last menstrual period?"     *No Answer*  Protocols used: Medication Question Call-A-AH

## 2021-04-27 NOTE — Telephone Encounter (Signed)
Please advise her to take metformin 1 tablet once daily instead.

## 2021-04-30 MED ORDER — DAPAGLIFLOZIN PROPANEDIOL 5 MG PO TABS: 5.0000 mg | ORAL_TABLET | Freq: Every day | ORAL | 3 refills | Status: DC

## 2021-04-30 NOTE — Addendum Note (Signed)
Addended by: Charlott Rakes on: 04/30/2021 02:06 PM   Modules accepted: Orders

## 2021-04-30 NOTE — Telephone Encounter (Signed)
I spoke with the patient on the phone and she complained of GI side effects of metformin.  Discussed other options for management of diabetes mellitus and after shared decision making she is willing to try [place].

## 2021-04-30 NOTE — Telephone Encounter (Signed)
Patient advised of the message per Dr. Margarita Rana, to take medication once daily.   Reminder of A1C number. Informed patient that next step would probably be insulin if she is unable to control numbers with diet and exercise.  Patient states she does not want to be on medication any longer.  Pls advise.

## 2021-05-01 ENCOUNTER — Telehealth: Payer: Self-pay | Admitting: Family Medicine

## 2021-05-01 NOTE — Telephone Encounter (Signed)
Pt states she needs Dr Margarita Rana to complete a 3051 form to provide additional aid hours. She needs help because she needs to stay off her leg.  Pt states she can pick up the form.

## 2021-05-06 ENCOUNTER — Telehealth: Payer: Self-pay

## 2021-05-06 MED ORDER — ACETAMINOPHEN-CODEINE #3 300-30 MG PO TABS: 1.0000 | ORAL_TABLET | Freq: Every evening | ORAL | 0 refills | Status: DC | PRN

## 2021-05-06 NOTE — Telephone Encounter (Signed)
Pt states that she has been having more pain in  her legs,back and hips. Hands are hurting very bad.  Does she need a virtual visit to discuss?

## 2021-05-06 NOTE — Telephone Encounter (Signed)
Tylenol #3 PA approved until 11/02/2021

## 2021-05-06 NOTE — Telephone Encounter (Signed)
I had referred her to pain management at her last visit.  I just sent a prescription for Tylenol 3 to her pharmacy.  I am not sure what a 3051 form is; if that is PCS form we can go ahead and complete it.

## 2021-05-07 ENCOUNTER — Telehealth: Payer: Self-pay

## 2021-05-07 NOTE — Telephone Encounter (Signed)
PCS form has been completed and awaiting PCP signature. °

## 2021-05-07 NOTE — Telephone Encounter (Signed)
Requested office notes has been faxed over.

## 2021-06-10 ENCOUNTER — Other Ambulatory Visit: Payer: Self-pay | Admitting: Family Medicine

## 2021-06-10 DIAGNOSIS — I10 Essential (primary) hypertension: Secondary | ICD-10-CM

## 2021-07-14 ENCOUNTER — Inpatient Hospital Stay
Admission: EM | Admit: 2021-07-14 | Discharge: 2021-07-17 | DRG: 916 | Disposition: A | Discharge status: Home / Self Care | Payer: Medicaid Other | Attending: Internal Medicine | Admitting: Internal Medicine

## 2021-07-14 ENCOUNTER — Other Ambulatory Visit: Payer: Self-pay

## 2021-07-14 DIAGNOSIS — Z6837 Body mass index (BMI) 37.0-37.9, adult: Secondary | ICD-10-CM

## 2021-07-14 DIAGNOSIS — Z79899 Other long term (current) drug therapy: Secondary | ICD-10-CM

## 2021-07-14 DIAGNOSIS — T782XXA Anaphylactic shock, unspecified, initial encounter: Secondary | ICD-10-CM

## 2021-07-14 DIAGNOSIS — T380X5A Adverse effect of glucocorticoids and synthetic analogues, initial encounter: Secondary | ICD-10-CM | POA: Diagnosis present

## 2021-07-14 DIAGNOSIS — Z91010 Allergy to peanuts: Secondary | ICD-10-CM

## 2021-07-14 DIAGNOSIS — Z7984 Long term (current) use of oral hypoglycemic drugs: Secondary | ICD-10-CM

## 2021-07-14 DIAGNOSIS — F17210 Nicotine dependence, cigarettes, uncomplicated: Secondary | ICD-10-CM | POA: Diagnosis present

## 2021-07-14 DIAGNOSIS — F419 Anxiety disorder, unspecified: Secondary | ICD-10-CM | POA: Diagnosis present

## 2021-07-14 DIAGNOSIS — E669 Obesity, unspecified: Secondary | ICD-10-CM | POA: Diagnosis present

## 2021-07-14 DIAGNOSIS — F32A Depression, unspecified: Secondary | ICD-10-CM | POA: Diagnosis present

## 2021-07-14 DIAGNOSIS — Z803 Family history of malignant neoplasm of breast: Secondary | ICD-10-CM

## 2021-07-14 DIAGNOSIS — Z8249 Family history of ischemic heart disease and other diseases of the circulatory system: Secondary | ICD-10-CM

## 2021-07-14 DIAGNOSIS — G8929 Other chronic pain: Secondary | ICD-10-CM | POA: Diagnosis present

## 2021-07-14 DIAGNOSIS — E1165 Type 2 diabetes mellitus with hyperglycemia: Secondary | ICD-10-CM | POA: Diagnosis present

## 2021-07-14 DIAGNOSIS — Z791 Long term (current) use of non-steroidal anti-inflammatories (NSAID): Secondary | ICD-10-CM

## 2021-07-14 DIAGNOSIS — E1169 Type 2 diabetes mellitus with other specified complication: Secondary | ICD-10-CM

## 2021-07-14 DIAGNOSIS — T783XXD Angioneurotic edema, subsequent encounter: Secondary | ICD-10-CM

## 2021-07-14 DIAGNOSIS — Z833 Family history of diabetes mellitus: Secondary | ICD-10-CM

## 2021-07-14 DIAGNOSIS — G43909 Migraine, unspecified, not intractable, without status migrainosus: Secondary | ICD-10-CM | POA: Diagnosis present

## 2021-07-14 DIAGNOSIS — T465X5A Adverse effect of other antihypertensive drugs, initial encounter: Secondary | ICD-10-CM | POA: Diagnosis present

## 2021-07-14 DIAGNOSIS — K219 Gastro-esophageal reflux disease without esophagitis: Secondary | ICD-10-CM | POA: Diagnosis present

## 2021-07-14 DIAGNOSIS — Z888 Allergy status to other drugs, medicaments and biological substances status: Secondary | ICD-10-CM

## 2021-07-14 DIAGNOSIS — I10 Essential (primary) hypertension: Secondary | ICD-10-CM | POA: Diagnosis present

## 2021-07-14 DIAGNOSIS — T783XXA Angioneurotic edema, initial encounter: Principal | ICD-10-CM | POA: Diagnosis present

## 2021-07-14 LAB — BASIC METABOLIC PANEL
Anion gap: 8 (ref 5–15)
BUN: 11 mg/dL (ref 6–20)
CO2: 22 mmol/L (ref 22–32)
Calcium: 9.2 mg/dL (ref 8.9–10.3)
Chloride: 105 mmol/L (ref 98–111)
Creatinine, Ser: 0.59 mg/dL (ref 0.44–1.00)
GFR, Estimated: >60 mL/min (ref >60)
Glucose, Bld: 141 mg/dL — ABNORMAL HIGH (ref 70–99)
Potassium: 3.7 mmol/L (ref 3.5–5.1)
Sodium: 135 mmol/L (ref 135–145)

## 2021-07-14 LAB — CBC WITH DIFFERENTIAL/PLATELET
Abs Immature Granulocytes: 0.04 10*3/uL (ref 0.00–0.07)
Basophils Absolute: 0.1 10*3/uL (ref 0.0–0.1)
Basophils Relative: 1 %
Eosinophils Absolute: 0.5 10*3/uL (ref 0.0–0.5)
Eosinophils Relative: 4 %
HCT: 45.5 % (ref 36.0–46.0)
Hemoglobin: 14.4 g/dL (ref 12.0–15.0)
Immature Granulocytes: 0 %
Lymphocytes Relative: 28 %
Lymphs Abs: 2.9 10*3/uL (ref 0.7–4.0)
MCH: 25.7 pg — ABNORMAL LOW (ref 26.0–34.0)
MCHC: 31.6 g/dL (ref 30.0–36.0)
MCV: 81.3 fL (ref 80.0–100.0)
Monocytes Absolute: 0.4 10*3/uL (ref 0.1–1.0)
Monocytes Relative: 4 %
Neutro Abs: 6.7 10*3/uL (ref 1.7–7.7)
Neutrophils Relative %: 63 %
Platelets: 357 10*3/uL (ref 150–400)
RBC: 5.6 MIL/uL — ABNORMAL HIGH (ref 3.87–5.11)
RDW: 16.4 % — ABNORMAL HIGH (ref 11.5–15.5)
WBC: 10.6 10*3/uL — ABNORMAL HIGH (ref 4.0–10.5)
nRBC: 0 % (ref 0.0–0.2)

## 2021-07-14 LAB — CBG MONITORING, ED: Glucose-Capillary: 295 mg/dL — ABNORMAL HIGH (ref 70–99)

## 2021-07-14 LAB — HEMOGLOBIN A1C
Hgb A1c MFr Bld: 6.8 % — ABNORMAL HIGH (ref 4.8–5.6)
Mean Plasma Glucose: 148.46 mg/dL

## 2021-07-14 MED ORDER — SODIUM CHLORIDE 0.9 % IV SOLN: 250.0000 mL | INTRAVENOUS | Status: DC | PRN

## 2021-07-14 MED ORDER — ACETAMINOPHEN 325 MG PO TABS
650.0000 mg | ORAL_TABLET | Freq: Four times a day (QID) | ORAL | Status: DC | PRN
  Administered 2021-07-16 (×2): 650 mg via ORAL
  Filled 2021-07-14 (×2): qty 2

## 2021-07-14 MED ORDER — ENOXAPARIN SODIUM 60 MG/0.6ML IJ SOSY
0.5000 mg/kg | PREFILLED_SYRINGE | INTRAMUSCULAR | Status: DC
  Administered 2021-07-14 – 2021-07-16 (×3): 50 mg via SUBCUTANEOUS
  Filled 2021-07-14 (×3): qty 0.6

## 2021-07-14 MED ORDER — EPINEPHRINE 0.3 MG/0.3ML IJ SOAJ
0.3000 mg | Freq: Once | INTRAMUSCULAR | Status: AC
Start: 2021-07-14 — End: 2021-07-14

## 2021-07-14 MED ORDER — SODIUM CHLORIDE 0.9% FLUSH
3.0000 mL | Freq: Two times a day (BID) | INTRAVENOUS | Status: DC
  Administered 2021-07-14 – 2021-07-17 (×7): 3 mL via INTRAVENOUS

## 2021-07-14 MED ORDER — EPINEPHRINE 1 MG/10ML IJ SOSY
0.3000 mg | PREFILLED_SYRINGE | Freq: Once | INTRAMUSCULAR | Status: AC
  Administered 2021-07-14: 0.3 mg via INTRAMUSCULAR
  Filled 2021-07-14: qty 10

## 2021-07-14 MED ORDER — GABAPENTIN 300 MG PO CAPS: 600.0000 mg | ORAL_CAPSULE | Freq: Two times a day (BID) | ORAL | Status: DC

## 2021-07-14 MED ORDER — MORPHINE SULFATE (PF) 4 MG/ML IV SOLN
4.0000 mg | Freq: Once | INTRAVENOUS | Status: AC
  Administered 2021-07-14: 4 mg via INTRAVENOUS
  Filled 2021-07-14: qty 1

## 2021-07-14 MED ORDER — DAPAGLIFLOZIN PROPANEDIOL 5 MG PO TABS
5.0000 mg | ORAL_TABLET | Freq: Every day | ORAL | Status: DC
  Administered 2021-07-15 – 2021-07-17 (×3): 5 mg via ORAL
  Filled 2021-07-14 (×3): qty 1

## 2021-07-14 MED ORDER — BACLOFEN 10 MG PO TABS
10.0000 mg | ORAL_TABLET | Freq: Two times a day (BID) | ORAL | Status: DC
  Administered 2021-07-14 – 2021-07-16 (×4): 10 mg via ORAL
  Filled 2021-07-14 (×4): qty 1

## 2021-07-14 MED ORDER — ALLOPURINOL 100 MG PO TABS
300.0000 mg | ORAL_TABLET | Freq: Every day | ORAL | Status: DC
  Administered 2021-07-14 – 2021-07-17 (×4): 300 mg via ORAL
  Filled 2021-07-14 (×2): qty 1
  Filled 2021-07-14 (×2): qty 3

## 2021-07-14 MED ORDER — ACETAMINOPHEN 650 MG RE SUPP: 650.0000 mg | Freq: Four times a day (QID) | RECTAL | Status: DC | PRN

## 2021-07-14 MED ORDER — SERTRALINE HCL 50 MG PO TABS
100.0000 mg | ORAL_TABLET | Freq: Every day | ORAL | Status: DC
  Administered 2021-07-14 – 2021-07-16 (×2): 100 mg via ORAL
  Filled 2021-07-14 (×3): qty 2

## 2021-07-14 MED ORDER — EPINEPHRINE 0.3 MG/0.3ML IJ SOAJ: 0.3000 mg | Freq: Once | INTRAMUSCULAR | Status: DC

## 2021-07-14 MED ORDER — INSULIN ASPART 100 UNIT/ML IJ SOLN
0.0000 [IU] | Freq: Three times a day (TID) | INTRAMUSCULAR | Status: DC
  Administered 2021-07-14: 11 [IU] via SUBCUTANEOUS
  Administered 2021-07-15: 7 [IU] via SUBCUTANEOUS
  Administered 2021-07-15: 15 [IU] via SUBCUTANEOUS
  Administered 2021-07-15: 7 [IU] via SUBCUTANEOUS
  Administered 2021-07-16: 15 [IU] via SUBCUTANEOUS
  Administered 2021-07-16: 7 [IU] via SUBCUTANEOUS
  Administered 2021-07-16: 11 [IU] via SUBCUTANEOUS
  Administered 2021-07-17: 4 [IU] via SUBCUTANEOUS
  Administered 2021-07-17: 3 [IU] via SUBCUTANEOUS
  Filled 2021-07-14 (×9): qty 1

## 2021-07-14 MED ORDER — QUETIAPINE FUMARATE 25 MG PO TABS
200.0000 mg | ORAL_TABLET | Freq: Every day | ORAL | Status: DC
  Administered 2021-07-14 – 2021-07-15 (×2): 200 mg via ORAL
  Filled 2021-07-14: qty 1
  Filled 2021-07-14: qty 8

## 2021-07-14 MED ORDER — FAMOTIDINE IN NACL 20-0.9 MG/50ML-% IV SOLN
20.0000 mg | Freq: Once | INTRAVENOUS | Status: AC
  Administered 2021-07-14: 20 mg via INTRAVENOUS
  Filled 2021-07-14: qty 50

## 2021-07-14 MED ORDER — SODIUM CHLORIDE 0.9% FLUSH: 3.0000 mL | INTRAVENOUS | Status: DC | PRN

## 2021-07-14 MED ORDER — SODIUM CHLORIDE 0.9 % IV BOLUS
1000.0000 mL | Freq: Once | INTRAVENOUS | Status: AC
  Administered 2021-07-14: 1000 mL via INTRAVENOUS

## 2021-07-14 MED ORDER — AMLODIPINE BESYLATE 10 MG PO TABS
10.0000 mg | ORAL_TABLET | Freq: Every day | ORAL | Status: DC
  Administered 2021-07-14: 10 mg via ORAL
  Filled 2021-07-14: qty 2

## 2021-07-14 MED ORDER — DIPHENHYDRAMINE HCL 50 MG/ML IJ SOLN
50.0000 mg | Freq: Once | INTRAMUSCULAR | Status: AC
  Administered 2021-07-14: 50 mg via INTRAVENOUS
  Filled 2021-07-14: qty 1

## 2021-07-14 MED ORDER — RACEPINEPHRINE HCL 2.25 % IN NEBU
0.2500 mL | INHALATION_SOLUTION | Freq: Once | RESPIRATORY_TRACT | Status: AC
  Administered 2021-07-14: 0.25 mL via RESPIRATORY_TRACT
  Filled 2021-07-14: qty 0.5

## 2021-07-14 MED ORDER — METHYLPREDNISOLONE SODIUM SUCC 125 MG IJ SOLR
125.0000 mg | Freq: Once | INTRAMUSCULAR | Status: AC
  Administered 2021-07-14: 125 mg via INTRAVENOUS
  Filled 2021-07-14: qty 2

## 2021-07-14 MED ORDER — EPINEPHRINE 0.3 MG/0.3ML IJ SOAJ
INTRAMUSCULAR | Status: AC
  Administered 2021-07-14: 0.3 mg via INTRAMUSCULAR
  Filled 2021-07-14: qty 0.3

## 2021-07-14 MED ORDER — TRAZODONE HCL 100 MG PO TABS
100.0000 mg | ORAL_TABLET | Freq: Every day | ORAL | Status: DC
  Administered 2021-07-14 – 2021-07-15 (×2): 100 mg via ORAL
  Filled 2021-07-14 (×2): qty 1

## 2021-07-14 MED ORDER — HYDROCODONE-ACETAMINOPHEN 5-325 MG PO TABS
1.0000 | ORAL_TABLET | ORAL | Status: DC | PRN
  Administered 2021-07-14 – 2021-07-17 (×7): 2 via ORAL
  Filled 2021-07-14 (×7): qty 2

## 2021-07-14 MED ORDER — ONDANSETRON HCL 4 MG/2ML IJ SOLN
4.0000 mg | Freq: Once | INTRAMUSCULAR | Status: AC
Start: 2021-07-14 — End: 2021-07-14
  Administered 2021-07-14: 4 mg via INTRAVENOUS
  Filled 2021-07-14: qty 2

## 2021-07-14 MED ORDER — CLONAZEPAM 0.25 MG PO TBDP
0.2500 mg | ORAL_TABLET | Freq: Every day | ORAL | Status: DC
  Administered 2021-07-14 – 2021-07-16 (×2): 0.25 mg via ORAL
  Filled 2021-07-14 (×2): qty 1

## 2021-07-14 NOTE — ED Notes (Signed)
Pts eye lids still swollen. Pt states that tongue is less swollen than before. No respiratory/ airway issues noted. ?

## 2021-07-14 NOTE — ED Notes (Signed)
Pt given 4 packs of graham crackers as well as some water and one can of sprite. Pt given TV remote and is currently eating.  ?

## 2021-07-14 NOTE — ED Notes (Signed)
Pt with c/o left left pain at epi injection site. Area bruised, red. Dr Ellender Hose notified. Pain med ordered. Ace wrap, ice applied. ?

## 2021-07-14 NOTE — ED Notes (Signed)
Pt stated "my tongue is getting tighter" MD made aware. No new orders.  ?

## 2021-07-14 NOTE — ED Notes (Signed)
Pt resting comfortably and is currently asleep. No respiratory issues noted.  ?

## 2021-07-14 NOTE — ED Notes (Signed)
After administering racemic epinephrine via nebulizer. See MAR. Pt is no longer complaining about tightening tongue and had no complaints about SOB.  ?

## 2021-07-14 NOTE — ED Provider Notes (Signed)
? ?[facility] ?Provider Note ? ? ? Event Date/Time  ? First MD Initiated Contact with Patient 07/14/21 [phone]   ?  (approximate) ? ? ?History  ? ?Allergic Reaction ? ? ?HPI ? ?Kelly Byrd is a 46 y.o. female with past medical history of hypertension, asthma, migraines, anemia, here with allergic reaction.  Patient states that over the last several weeks, she has had intermittent episodes in which she is woken up with lip swelling, tongue swelling, and a pruritic rash.  She has history of allergic reactions, as well as angioedema from ACE inhibitors.  She is not taking 1.  She states that she was actually started on a new medication within the last month, both for her diabetes as well as her blood pressure.  No other new triggers.  She also was reportedly at the beach and believes she may have been bit by an insect.  She states she currently has significant eye swelling, tongue swelling, and a sensation that her throat is closing up.  Denies any wheezing.  No nausea or vomiting. ?  ? ? ?Physical Exam  ? ?Triage Vital Signs: ?ED Triage Vitals [07/14/21 0938]  ?Enc Vitals Group  ?   BP 137/81  ?   Pulse Rate 79  ?   Resp 20  ?   Temp 98.8 ?F (37.1 ?C)  ?   Temp Source Oral  ?   SpO2 98 %  ?   Weight 218 lb (98.9 kg)  ?   Height '5\' 4"'$  (1.626 m)  ?   Head Circumference   ?   Peak Flow   ?   Pain Score 8  ?   Pain Loc   ?   Pain Edu?   ?   Excl. in [place]?   ? ? ?Most recent vital signs: ?Vitals:  ? 07/14/21 1230 07/14/21 1553  ?BP: (!) 122/59 118/68  ?Pulse: 82 86  ?Resp: 17 19  ?Temp:    ?SpO2: 100% 95%  ? ? ? ?General: Awake, no distress.  ?CV:  Good peripheral perfusion.  Regular rate and rhythm. ?Resp:  Normal effort.  No wheezing. ?Abd:  No distention.  Normal bowel sounds.  No tenderness. ?Other:  Marked bilateral periorbital edema, mild lip swelling.  There is asymmetric edema of the right tongue, uvula appears nonedematous.  Voice is normal.  No stridor, but patient frequently  clearing throat. ? ? ?ED Results / Procedures / Treatments  ? ?Labs ?(all labs ordered are listed, but only abnormal results are displayed) ?Labs Reviewed  ?CBC WITH DIFFERENTIAL/PLATELET - Abnormal; Notable for the following components:  ?    Result Value  ? WBC 10.6 (*)   ? RBC 5.60 (*)   ? MCH 25.7 (*)   ? RDW 16.4 (*)   ? All other components within normal limits  ?BASIC METABOLIC PANEL - Abnormal; Notable for the following components:  ? Glucose, Bld 141 (*)   ? All other components within normal limits  ?CBG MONITORING, ED - Abnormal; Notable for the following components:  ? Glucose-Capillary 295 (*)   ? All other components within normal limits  ?HEMOGLOBIN R6E  ?BASIC METABOLIC PANEL  ?CBC  ? ? ? ?EKG ? ? ?RADIOLOGY ? ? ?PROCEDURES: ? ?Critical Care performed: Yes, see critical care procedure note(s) ? ?.Critical Care ?Performed by: Duffy Bruce, MD ?Authorized by: Duffy Bruce, MD  ? ?Critical care provider statement:  ?  Critical care time (minutes):  30 ?  Critical care time was exclusive of:  Separately billable procedures and treating other patients ?  Critical care was necessary to treat or prevent imminent or life-threatening deterioration of the following conditions:  Cardiac failure, circulatory failure and respiratory failure ?  Critical care was time spent personally by me on the following activities:  Development of treatment plan with patient or surrogate, discussions with consultants, evaluation of patient's response to treatment, examination of patient, ordering and review of laboratory studies, ordering and review of radiographic studies, ordering and performing treatments and interventions, pulse oximetry, re-evaluation of patient's condition and review of old charts ?.1-3 Lead EKG Interpretation ?Performed by: Duffy Bruce, MD ?Authorized by: Duffy Bruce, MD  ? ?  Interpretation: normal   ?  ECG rate:  70-90 ?  ECG rate assessment: normal   ?  Rhythm: sinus rhythm   ?  Ectopy:  none   ?  Conduction: normal   ?Comments:  ?   Indication: SOB, anaphylaxis ? ? ? ?MEDICATIONS ORDERED IN ED: ?Medications  ?EPINEPHrine (EPI-PEN) injection 0.3 mg (has no administration in time range)  ?allopurinol (ZYLOPRIM) tablet 300 mg (has no administration in time range)  ?amLODipine (NORVASC) tablet 10 mg (has no administration in time range)  ?QUEtiapine (SEROQUEL) tablet 200 mg (has no administration in time range)  ?sertraline (ZOLOFT) tablet 100 mg (100 mg Oral Given 07/14/21 1743)  ?traZODone (DESYREL) tablet 100 mg (has no administration in time range)  ?dapagliflozin propanediol (FARXIGA) tablet 5 mg (has no administration in time range)  ?baclofen (LIORESAL) tablet 10 mg (has no administration in time range)  ?clonazePAM (KLONOPIN) disintegrating tablet 0.25 mg (has no administration in time range)  ?insulin aspart (novoLOG) injection 0-20 Units (has no administration in time range)  ?enoxaparin (LOVENOX) injection 50 mg (has no administration in time range)  ?sodium chloride flush (NS) 0.9 % injection 3 mL (has no administration in time range)  ?sodium chloride flush (NS) 0.9 % injection 3 mL (has no administration in time range)  ?0.9 %  sodium chloride infusion (has no administration in time range)  ?acetaminophen (TYLENOL) tablet 650 mg (has no administration in time range)  ?  Or  ?acetaminophen (TYLENOL) suppository 650 mg (has no administration in time range)  ?HYDROcodone-acetaminophen (NORCO/VICODIN) 5-325 MG per tablet 1-2 tablet (2 tablets Oral Given 07/14/21 1742)  ?EPINEPHrine (EPI-PEN) injection 0.3 mg (0.3 mg Intramuscular Given 07/14/21 1014)  ?methylPREDNISolone sodium succinate (SOLU-MEDROL) 125 mg/2 mL injection 125 mg (125 mg Intravenous Given 07/14/21 1001)  ?diphenhydrAMINE (BENADRYL) injection 50 mg (50 mg Intravenous Given 07/14/21 1003)  ?famotidine (PEPCID) IVPB 20 mg premix (0 mg Intravenous Stopped 07/14/21 1203)  ?sodium chloride 0.9 % bolus 1,000 mL (0 mLs Intravenous Stopped  07/14/21 1226)  ?Racepinephrine HCl 2.25 % nebulizer solution 0.25 mL (0.25 mLs Nebulization Given 07/14/21 1018)  ?morphine (PF) 4 MG/ML injection 4 mg (4 mg Intravenous Given 07/14/21 1222)  ?ondansetron [facility]) injection 4 mg (4 mg Intravenous Given 07/14/21 1222)  ?EPINEPHrine (ADRENALIN) 1 MG/10ML injection 0.3 mg (0.3 mg Intramuscular Given 07/14/21 1324)  ? ? ? ?IMPRESSION / MDM / ASSESSMENT AND PLAN / ED COURSE  ?I reviewed the triage vital signs and the nursing notes. ?             ?               ? ? ?The patient is on the cardiac monitor to evaluate for evidence of arrhythmia and/or significant heart rate changes. ? ?MDM:  ?46  yo F with PMHx HTN, prior allergic reactions, here with anaphylaxis/angioedema. Unclear trigger, though pt reports that she did change her BP and a diabetic med about 1 month ago. DDx includes undiagnosed hereditary angioedema, acquired autoimmune d/o/allergy. Pt given IM epi, benadryl, solumedrol, and pepcid with initial improvement. Following a period of observation, pt had return of mild but recurrent tongue swelling. Additional epi dose given and will admit for observation. Screening labs unremarkable. Mild leukocytosis is likely reactive. No anemia. Electrolytes unremarkable. Will admit for observation. Pt updated and is in agreement with this plan.  ? ? ?MEDICATIONS GIVEN IN ED: ?Medications  ?EPINEPHrine (EPI-PEN) injection 0.3 mg (has no administration in time range)  ?allopurinol (ZYLOPRIM) tablet 300 mg (has no administration in time range)  ?amLODipine (NORVASC) tablet 10 mg (has no administration in time range)  ?QUEtiapine (SEROQUEL) tablet 200 mg (has no administration in time range)  ?sertraline (ZOLOFT) tablet 100 mg (100 mg Oral Given 07/14/21 1743)  ?traZODone (DESYREL) tablet 100 mg (has no administration in time range)  ?dapagliflozin propanediol (FARXIGA) tablet 5 mg (has no administration in time range)  ?baclofen (LIORESAL) tablet 10 mg (has no administration in  time range)  ?clonazePAM (KLONOPIN) disintegrating tablet 0.25 mg (has no administration in time range)  ?insulin aspart (novoLOG) injection 0-20 Units (has no administration in time range)  ?enoxaparin (LOVEN

## 2021-07-14 NOTE — ED Notes (Signed)
Pt states that her swelling is better. Swelling in eyelids has improved. Pt still c/o injection site pain in left leg and right buttocks. ?

## 2021-07-14 NOTE — H&P (Signed)
? ? ?History and Physical:  ? ? ?Kelly Byrd  ? ?SLH:[id number] DOB: 1975/04/22 DOA: 07/14/2021 ? ?Referring MD/provider: Dr. Ellender Hose ?PCP: Charlott Rakes, MD  ? ?Patient coming from: Home ? ?Chief Complaint: Swelling of lips, tongue and eyelids intermittently on and off for 4 days ? ?History of Present Illness:  ? ?Kelly Byrd is an 46 y.o. female with asthma, HTN, chronic LBP, migraines and recurrent angioedema who was in her USO H until 4 days ago when she noted an area of swelling possible urticaria on left lower abdomen.  Patient self treated with Benadryl.  She subsequently noted onset of swelling in her lips and a little bit in her eyelids which she also self treated with Benadryl.  Patient states she use Benadryl frequently over the course of the weekend but has had progressive swelling of her lips and eyelids.  Today she had swelling at the bottom of her mouth which concerned her enough to come to the ED. ? ?Patient denies shortness of breath per se.  Denies wheezing.  Her biggest concern is the swelling of her tongue.  In the ED she was treated with epi with may be some improvement, patient states she does not feel like she is any better.  Her most biggest concern is the pain at site of epi shot. ? ?At present patient states she feels sleepy, probably from Benadryl.  Denies any difficulty breathing.  Denies nausea or vomiting.  No abdominal pain.  No diarrhea.  Patient denies pruritus. ? ?Notes she has had this urticaria on and off for many years, had childhood peanut allergy.  Patient states she had been seen by an allergist several years ago and was told she was allergic to many things including multiple vegetables and fruit, nuts and possibly meat.  Patient states that she never allows anyone to cook for her and only cooks for herself knowing what her allergies are.  Patient does not think she has had any foods to which she is allergic over the weekend.  Of note patient does have  a fast food bag at bedside which she states has chicken.  Notes she never eats red meat only chicken and fish although admits to having half a cheeseburger yesterday.  Does admit to taking both Celebrex and meloxicam intermittently. ? ?ED Course:  The patient was treated with subcu and inhalational racemic epi IV Benadryl, Solu-Medrol and H2 blockers.  Patient with persistent significant bilateral eyelid edema although apparently has decreased tongue swelling.  Is now brought in for observation overnight. ? ?ROS:  ? ?ROS  ? ?Review of Systems: ?General: Denies fever, chills, malaise,  ?Eyes: Denies recent change in vision, no discharge, redness, pain noted ?Endocrine: Denies heat/cold intolerance, polyuria or weight loss. ?Respiratory: Denies cough, SOB at rest or hemoptysis ?Cardiovascular: Denies chest pain or palpitations ?GI: Denies nausea, vomiting, diarrhea or constipation ?GU: Denies dysuria, frequency or hematuria ?CNS: Denies HA, dizziness, confusion, new weakness or clumsiness. ?Blood/lymphatics: Denies easy bruising or bleeding ?Mood/affect: Denies anxiety/depression  ? ? ?Past Medical History:  ? ?Past Medical History:  ?Diagnosis Date  ? Asthma   ? Back pain   ? Chronic kidney disease   ? frequent infrections  ? Depression   ? Dislocation of hip ([place])   ? right hip  ? GERD (gastroesophageal reflux disease)   ? Hypertension   ? Migraines   ? Pre-diabetes   ? Sickle cell trait ([place])   ? ? ?Past Surgical History:  ? ?Past  Surgical History:  ?Procedure Laterality Date  ? CESAREAN SECTION    ? ?2  ? CYSTOSCOPY N/A 10/23/2019  ? Procedure: CYSTOSCOPY;  Surgeon: Malachy Mood, MD;  Location: [facility] ORS;  Service: Gynecology;  Laterality: N/A;  ? TOTAL LAPAROSCOPIC HYSTERECTOMY WITH SALPINGECTOMY Bilateral 10/23/2019  ? Procedure: TOTAL LAPAROSCOPIC HYSTERECTOMY CONVERTED TO OPEN SUPRACERVICAL HYSTERECTOMY;  Surgeon: Malachy Mood, MD;  Location: [facility] ORS;  Service: Gynecology;  Laterality: Bilateral;  ?  TUBAL LIGATION    ? upper teeth    ? ? ?Social History:  ? ?Social History  ? ?Socioeconomic History  ? Marital status: Married  ?  Spouse name: Not on file  ? Number of children: Not on file  ? Years of education: Not on file  ? Highest education level: Not on file  ?Occupational History  ? Occupation: disabled  ?Tobacco Use  ? Smoking status: Every Day  ?  Packs/day: 0.25  ?  Types: Cigarettes  ? Smokeless tobacco: Never  ?Vaping Use  ? Vaping Use: Never used  ?Substance and Sexual Activity  ? Alcohol use: Yes  ?  Alcohol/week: 1.0 standard drink  ?  Types: 1 Cans of beer per week  ? Drug use: Yes  ?  Types: Cocaine  ?  Comment: + cocaine 09/2017   Denies use on 7/21 2021  ? Sexual activity: Yes  ?  Birth control/protection: Surgical  ?Other Topics Concern  ? Not on file  ?Social History Narrative  ? Not on file  ? ?Social Determinants of Health  ? ?Financial Resource Strain: Not on file  ?Food Insecurity: Not on file  ?Transportation Needs: Not on file  ?Physical Activity: Not on file  ?Stress: Not on file  ?Social Connections: Not on file  ?Intimate Partner Violence: Not on file  ? ? ?Allergies  ? ?Ace inhibitors, Lisinopril, and Peanuts [peanut oil] ? ?Family history:  ? ?Family History  ?Problem Relation Age of Onset  ? Hypertension Mother   ? Hypertension Father   ? Breast cancer Maternal Grandmother   ? Diabetes Mellitus II Maternal Grandmother   ? Breast cancer Paternal Grandmother   ? Lupus Other   ? ? ?Current Medications:  ? ?Prior to Admission medications   ?Medication Sig Start Date End Date Taking? Authorizing Provider  ?amLODipine (NORVASC) 10 MG tablet Take 1 tablet by mouth once daily 06/10/21   Charlott Rakes, MD  ?losartan (COZAAR) 50 MG tablet Take 1 tablet by mouth once daily 06/10/21   Charlott Rakes, MD  ?acetaminophen-codeine (TYLENOL #3) 300-30 MG tablet Take 1 tablet by mouth at bedtime as needed for moderate pain. 05/06/21   Charlott Rakes, MD  ?albuterol (PROVENTIL) (2.5 MG/3ML) 0.083%  nebulizer solution Take 3 mLs (2.5 mg total) by nebulization every 6 (six) hours as needed for wheezing or shortness of breath (wheezing). 01/30/21   Charlott Rakes, MD  ?allopurinol (ZYLOPRIM) 300 MG tablet Take 1 tablet (300 mg total) by mouth daily. 04/20/21   Charlott Rakes, MD  ?atorvastatin (LIPITOR) 20 MG tablet Take 1 tablet (20 mg total) by mouth daily. 03/19/19   Charlott Rakes, MD  ?baclofen (LIORESAL) 10 MG tablet Take 10 mg by mouth in the morning and at bedtime.  07/16/19   [provider]  ?Blood Glucose Monitoring Suppl (TRUE METRIX METER) DEVI 1 each by Does not apply route 3 (three) times daily. 04/15/21   Charlott Rakes, MD  ?celecoxib (CELEBREX) 100 MG capsule Take 1 capsule (100 mg total) by mouth daily as needed.  10/18/20   Enzo Bi, MD  ?cetirizine (ZYRTEC) 10 MG tablet Take 1 tablet by mouth once daily 04/17/21   Charlott Rakes, MD  ?ciprofloxacin-dexamethasone (CIPRODEX) OTIC suspension Place 4 drops into both ears 2 (two) times daily. 02/02/21   Charlott Rakes, MD  ?dapagliflozin propanediol (FARXIGA) 5 MG TABS tablet Take 1 tablet (5 mg total) by mouth daily before breakfast. 04/30/21   Charlott Rakes, MD  ?diclofenac Sodium (VOLTAREN) 1 % GEL Apply 1 application topically 4 (four) times daily as needed (pain.).  07/16/19   [provider]  ?EPINEPHrine 0.3 mg/0.3 mL IJ SOAJ injection Inject 0.3 mLs (0.3 mg total) into the muscle as needed for anaphylaxis. 07/23/19   Fritzi Mandes, MD  ?famotidine (PEPCID) 20 MG tablet Take 1 tablet (20 mg total) by mouth 2 (two) times daily. 06/27/19 10/11/20  Patrecia Pour, MD  ?fluticasone Asencion Islam) 50 MCG/ACT nasal spray Use 2 spray(s) in each nostril once daily 02/02/21   Charlott Rakes, MD  ?gabapentin (NEURONTIN) 300 MG capsule Take 2 capsules (600 mg total) by mouth 2 (two) times daily. 02/02/21   Charlott Rakes, MD  ?glucose blood (TRUE METRIX BLOOD GLUCOSE TEST) test strip 1 each by Other route 3 (three) times daily. 04/15/21   Charlott Rakes, MD  ?meloxicam (MOBIC) 7.5 MG tablet Take 1 tablet (7.5 mg total) by mouth daily. 04/15/21   Charlott Rakes, MD  ?Misc. Devices MISC Rolling walker with seat 02/02/21   Charlott Rakes, MD  ?Levie Heritage

## 2021-07-14 NOTE — ED Notes (Signed)
Pt stated to this tech, "I am feeling very anxious and depressed. I am trying to calm down. Can I have something for anxiety?" Will inform Maricar, RN.  ?

## 2021-07-14 NOTE — Progress Notes (Signed)
Anticoagulation monitoring(Lovenox): ? ?46yo  F ordered Lovenox 40 mg Q24h ?   ?Filed Weights  ? 07/14/21 0938  ?Weight: 98.9 kg (218 lb)  ? ?BMI 37  ? ?Lab Results  ?Component Value Date  ? CREATININE 0.59 07/14/2021  ? CREATININE 0.81 04/15/2021  ? CREATININE 0.62 10/18/2020  ? ?Estimated Creatinine Clearance: 102.6 mL/min (by C-G formula based on SCr of 0.59 mg/dL). ?Hemoglobin & Hematocrit  ?   ?Component Value Date/Time  ? HGB 14.4 07/14/2021 0955  ? HCT 45.5 07/14/2021 0955  ? ? ? ?Per Protocol for Patient with estCrcl >30 ml/min and BMI > 30, will transition to Lovenox 0.5 mg/kg Q24h  ?  ?  ? ?Chinita Greenland PharmD ?Clinical Pharmacist ?07/14/2021 ? ?

## 2021-07-14 NOTE — ED Triage Notes (Signed)
Pt states she woke up with facial swelling to eyes and feels like her tongue is swelling , states she has a peanut allergy but did not think she had an exposure peanuts, states something bit her leg yesterday and she took benadryl last night but has not taken any today ?

## 2021-07-15 DIAGNOSIS — Z833 Family history of diabetes mellitus: Secondary | ICD-10-CM | POA: Diagnosis not present

## 2021-07-15 DIAGNOSIS — I959 Hypotension, unspecified: Secondary | ICD-10-CM | POA: Diagnosis not present

## 2021-07-15 DIAGNOSIS — K219 Gastro-esophageal reflux disease without esophagitis: Secondary | ICD-10-CM | POA: Diagnosis present

## 2021-07-15 DIAGNOSIS — I10 Essential (primary) hypertension: Secondary | ICD-10-CM | POA: Diagnosis present

## 2021-07-15 DIAGNOSIS — E1169 Type 2 diabetes mellitus with other specified complication: Secondary | ICD-10-CM | POA: Diagnosis not present

## 2021-07-15 DIAGNOSIS — Z803 Family history of malignant neoplasm of breast: Secondary | ICD-10-CM | POA: Diagnosis not present

## 2021-07-15 DIAGNOSIS — Z7984 Long term (current) use of oral hypoglycemic drugs: Secondary | ICD-10-CM | POA: Diagnosis not present

## 2021-07-15 DIAGNOSIS — T783XXA Angioneurotic edema, initial encounter: Secondary | ICD-10-CM | POA: Diagnosis not present

## 2021-07-15 DIAGNOSIS — Z91010 Allergy to peanuts: Secondary | ICD-10-CM | POA: Diagnosis not present

## 2021-07-15 DIAGNOSIS — T783XXD Angioneurotic edema, subsequent encounter: Secondary | ICD-10-CM | POA: Diagnosis not present

## 2021-07-15 DIAGNOSIS — E1165 Type 2 diabetes mellitus with hyperglycemia: Secondary | ICD-10-CM | POA: Diagnosis present

## 2021-07-15 DIAGNOSIS — F419 Anxiety disorder, unspecified: Secondary | ICD-10-CM | POA: Diagnosis present

## 2021-07-15 DIAGNOSIS — G8929 Other chronic pain: Secondary | ICD-10-CM | POA: Diagnosis present

## 2021-07-15 DIAGNOSIS — F32A Depression, unspecified: Secondary | ICD-10-CM | POA: Diagnosis present

## 2021-07-15 DIAGNOSIS — Z79899 Other long term (current) drug therapy: Secondary | ICD-10-CM | POA: Diagnosis not present

## 2021-07-15 DIAGNOSIS — Z888 Allergy status to other drugs, medicaments and biological substances status: Secondary | ICD-10-CM | POA: Diagnosis not present

## 2021-07-15 DIAGNOSIS — F17210 Nicotine dependence, cigarettes, uncomplicated: Secondary | ICD-10-CM | POA: Diagnosis present

## 2021-07-15 DIAGNOSIS — E669 Obesity, unspecified: Secondary | ICD-10-CM | POA: Diagnosis present

## 2021-07-15 DIAGNOSIS — Z8249 Family history of ischemic heart disease and other diseases of the circulatory system: Secondary | ICD-10-CM | POA: Diagnosis not present

## 2021-07-15 DIAGNOSIS — Z791 Long term (current) use of non-steroidal anti-inflammatories (NSAID): Secondary | ICD-10-CM | POA: Diagnosis not present

## 2021-07-15 DIAGNOSIS — G43909 Migraine, unspecified, not intractable, without status migrainosus: Secondary | ICD-10-CM | POA: Diagnosis present

## 2021-07-15 DIAGNOSIS — T465X5A Adverse effect of other antihypertensive drugs, initial encounter: Secondary | ICD-10-CM | POA: Diagnosis present

## 2021-07-15 DIAGNOSIS — R400 Somnolence: Secondary | ICD-10-CM | POA: Diagnosis not present

## 2021-07-15 DIAGNOSIS — T380X5A Adverse effect of glucocorticoids and synthetic analogues, initial encounter: Secondary | ICD-10-CM | POA: Diagnosis present

## 2021-07-15 DIAGNOSIS — Z6837 Body mass index (BMI) 37.0-37.9, adult: Secondary | ICD-10-CM | POA: Diagnosis not present

## 2021-07-15 LAB — BASIC METABOLIC PANEL
Anion gap: 7 (ref 5–15)
BUN: 13 mg/dL (ref 6–20)
CO2: 22 mmol/L (ref 22–32)
Calcium: 8.6 mg/dL — ABNORMAL LOW (ref 8.9–10.3)
Chloride: 106 mmol/L (ref 98–111)
Creatinine, Ser: 0.69 mg/dL (ref 0.44–1.00)
GFR, Estimated: >60 mL/min (ref >60)
Glucose, Bld: 263 mg/dL — ABNORMAL HIGH (ref 70–99)
Potassium: 3.9 mmol/L (ref 3.5–5.1)
Sodium: 135 mmol/L (ref 135–145)

## 2021-07-15 LAB — CBC
HCT: 43 % (ref 36.0–46.0)
Hemoglobin: 13.5 g/dL (ref 12.0–15.0)
MCH: 25.9 pg — ABNORMAL LOW (ref 26.0–34.0)
MCHC: 31.4 g/dL (ref 30.0–36.0)
MCV: 82.4 fL (ref 80.0–100.0)
Platelets: 373 10*3/uL (ref 150–400)
RBC: 5.22 MIL/uL — ABNORMAL HIGH (ref 3.87–5.11)
RDW: 16.5 % — ABNORMAL HIGH (ref 11.5–15.5)
WBC: 17.4 10*3/uL — ABNORMAL HIGH (ref 4.0–10.5)
nRBC: 0 % (ref 0.0–0.2)

## 2021-07-15 LAB — CBG MONITORING, ED
Glucose-Capillary: 319 mg/dL — ABNORMAL HIGH (ref 70–99)
Glucose-Capillary: 206 mg/dL — ABNORMAL HIGH (ref 70–99)

## 2021-07-15 LAB — GLUCOSE, CAPILLARY
Glucose-Capillary: 237 mg/dL — ABNORMAL HIGH (ref 70–99)
Glucose-Capillary: 260 mg/dL — ABNORMAL HIGH (ref 70–99)

## 2021-07-15 MED ORDER — FAMOTIDINE 20 MG PO TABS
40.0000 mg | ORAL_TABLET | Freq: Every day | ORAL | Status: DC
  Administered 2021-07-15 – 2021-07-17 (×3): 40 mg via ORAL
  Filled 2021-07-15 (×3): qty 2

## 2021-07-15 MED ORDER — METHYLPREDNISOLONE SODIUM SUCC 125 MG IJ SOLR
80.0000 mg | INTRAMUSCULAR | Status: DC
  Administered 2021-07-15 – 2021-07-16 (×2): 80 mg via INTRAVENOUS
  Filled 2021-07-15 (×2): qty 2

## 2021-07-15 NOTE — Progress Notes (Signed)
PHARMACIST - PHYSICIAN COMMUNICATION ?  ?CONCERNING: Methylprednisolone IV  ?  ?Current order: Methylprednisolone IV '40mg'$  q12 ?  ?  ?DESCRIPTION: ?Per [place] Protocol:  ? ?IV methylprednisolone will be converted to either a q12h or q24h frequency with the same total daily dose (TDD). ? ?Ordered Dose: 1 to 125 mg TDD; convert to: TDD q24h.  ?Ordered Dose: 126 to 250 mg TDD; convert to: TDD div q12h.  ?Ordered Dose: >250 mg TDD; DAW. ? ?Order has been adjusted to: Methylprednisolone IV '80mg'$  q24 ? ? ?Thank you for allowing pharmacy to be a part of this patient?s care. ? ?Darrick Penna, PharmD, MS PGPM ?Clinical Pharmacist ?07/15/2021 ?4:44 PM ? ? ?

## 2021-07-15 NOTE — Progress Notes (Signed)
Admission profile updated. ?

## 2021-07-15 NOTE — Progress Notes (Signed)
Nutrition Brief Note ? ?RD consulted for assessment of nutritional requirements/ status.  ? ?Wt Readings from Last 15 Encounters:  ?07/14/21 98.9 kg  ?04/15/21 113.4 kg  ?02/02/21 106.6 kg  ?10/18/20 110.1 kg  ?05/14/20 107.1 kg  ?02/29/20 107.5 kg  ?11/02/19 108 kg  ?10/23/19 (!) 109.3 kg  ?10/17/19 109.3 kg  ?10/17/19 109.3 kg  ?10/09/19 106.6 kg  ?09/19/19 107 kg  ?09/03/19 106.6 kg  ?07/22/19 106.6 kg  ?07/09/19 109.4 kg  ? ?Kelly Byrd is an 46 y.o. female with asthma, HTN, chronic LBP, migraines and recurrent angioedema who was in her USO H until 4 days ago when she noted an area of swelling possible urticaria on left lower abdomen.  Patient self treated with Benadryl.  She subsequently noted onset of swelling in her lips and a little bit in her eyelids which she also self treated with Benadryl.  Patient states she use Benadryl frequently over the course of the weekend but has had progressive swelling of her lips and eyelids.  Today she had swelling at the bottom of her mouth which concerned her enough to come to the ED. ? ?Pt admitted with recurrent angioedema.  ? ?RD consulted due to pt's multiple food allergies. Spoke with pt at bedside. Pt reports multiple allergies, mainly fruits and vegetables. She has a childhood peanut allergy. Per pt, the only other foods she is allergic to are oranges and brussels sprouts. When pt asked what happens if she tries to consume these foods, she reports "I don't know. It's been so long since I've had them that I don't remember. I guess I swell up or something". Explained to pt that if allergies are entered into the system, that this restricts all food items that have food or potential additives. Pt shares that she knows which foods she needs to avoid and would rather she select what she is able to eat herself. Pt denies any further needs. RD provided pt a cup of apple juice per her request as she does not like the taste of the water provided by the hospital.   ? ?RD verified allergies; peanut allergy already entered into Epic.  ? ?Body mass index is 37.42 kg/m?Marland Kitchen Patient meets criteria for obesity, class II based on current BMI.  ? ?Current diet order is carb modified, patient is consuming approximately n/a% of meals at this time. Labs and medications reviewed.  ? ?No nutrition interventions warranted at this time. If nutrition issues arise, please consult RD.  ? ?Loistine Chance, RD, LDN, CDCES ?Registered Dietitian II ?Certified Diabetes Care and Education Specialist ?Please refer to [facility] for RD and/or RD on-call/weekend/after hours pager   ?

## 2021-07-15 NOTE — Progress Notes (Signed)
?PROGRESS NOTE ? ? ? ?Kelly Byrd  YQI:[id number] DOB: 08-12-1975 DOA: 07/14/2021 ?PCP: Charlott Rakes, MD ? ?Assessment & Plan: ?  ?Active Problems: ?  Angioedema ? ? 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 D/c celebrex, losartan. Continue on steroids, famotidine. EpiPen at bedside in case she does develop anaphylaxis. Still w/ significant facial swelling today  ? ?Hypotension: holding amlodipine. D/c losartan. Never any ACE-I and/or ARBs ? ?HTN:  hold amlodipine secondary to hypotension. D/c losartan. Never any ACE-I and/or ARBs ?  ?Depression: severity unknown. Continue on home dose of sertraline, seroquel  ?  ?DMII: likely poorly controlled. Continue on SSI w/ accuchecks ? ?Obesity: BMI 37.4. Would benefit from weight loss ?  ?Chronic pain: continue on home dose of baclofen, klonopin  ? ?Leukocytosis: likely secondary to steroid use.  ? ? ?DVT prophylaxis: lovenox  ?Code Status: full  ?Family Communication: discussed pt's care w/ pt's mother, Peter Congo, and answered her questions ?Disposition Plan: likely d/c back home  ? ?Level of care: Progressive ? ?Status is: Inpatient ?Remains inpatient appropriate because: still w/ significant facial swelling  ? ? ? ? ? ?Consultants:  ? ? ?Procedures:  ? ?Antimicrobials:  ? ?Subjective: ?Pt c/o pain in face ? ?Objective: ?Vitals:  ? 07/15/21 0330 07/15/21 0400 07/15/21 0500 07/15/21 0800  ?BP: 115/62 115/66 120/69 (!) 96/51  ?Pulse: 67 71 68 70  ?Resp: '16 14 15 14  '$ ?Temp: 98.3 ?F (36.8 ?C) 98 ?F (36.7 ?C) 97.8 ?F (36.6 ?C)   ?TempSrc:      ?SpO2: 94% 93% 96% 97%  ?Weight:      ?Height:      ? ?No intake or output data in the 24 hours ending 07/15/21 0856 ?Filed Weights  ? 07/14/21 0938  ?Weight: 98.9 kg  ? ? ?Examination: ? ?General exam: Appears calm but uncomfortable. Facial swelling  ?Respiratory system: Clear to auscultation. Respiratory effort normal. ?Cardiovascular system: S1 & S2+. No rubs, gallops or clicks. ?Gastrointestinal system:  Abdomen is nondistended, soft and nontender. Normal bowel sounds heard. ?Central nervous system: Alert and oriented. Moves all extremities  ?Psychiatry: Judgement and insight appear normal. Flat mood and affect  ? ? ? ?Data Reviewed: I have personally reviewed following labs and imaging studies ? ?CBC: ?Recent Labs  ?Lab 07/14/21 ?3875 07/15/21 ?6433  ?WBC 10.6* 17.4*  ?NEUTROABS 6.7  --   ?HGB 14.4 13.5  ?HCT 45.5 43.0  ?MCV 81.3 82.4  ?PLT 357 373  ? ?Basic Metabolic Panel: ?Recent Labs  ?Lab 07/14/21 ?2951 07/15/21 ?8841  ?NA 135 135  ?K 3.7 3.9  ?CL 105 106  ?CO2 22 22  ?GLUCOSE 141* 263*  ?BUN 11 13  ?CREATININE 0.59 0.69  ?CALCIUM 9.2 8.6*  ? ?GFR: ?Estimated Creatinine Clearance: 102.6 mL/min (by C-G formula based on SCr of 0.69 mg/dL). ?Liver Function Tests: ?No results for input(s): AST, ALT, ALKPHOS, BILITOT, PROT, ALBUMIN in the last 168 hours. ?No results for input(s): LIPASE, AMYLASE in the last 168 hours. ?No results for input(s): AMMONIA in the last 168 hours. ?Coagulation Profile: ?No results for input(s): INR, PROTIME in the last 168 hours. ?Cardiac Enzymes: ?No results for input(s): CKTOTAL, CKMB, CKMBINDEX, TROPONINI in the last 168 hours. ?BNP (last 3 results) ?No results for input(s): PROBNP in the last 8760 hours. ?HbA1C: ?Recent Labs  ?  07/14/21 ?0955  ?HGBA1C 6.8*  ? ?CBG: ?Recent Labs  ?Lab 07/14/21 ?1743 07/15/21 ?0804  ?GLUCAP 295* 319*  ? ?Lipid Profile: ?No results for input(s): CHOL, HDL,  LDLCALC, TRIG, CHOLHDL, LDLDIRECT in the last 72 hours. ?Thyroid Function Tests: ?No results for input(s): TSH, T4TOTAL, FREET4, T3FREE, THYROIDAB in the last 72 hours. ?Anemia Panel: ?No results for input(s): VITAMINB12, FOLATE, FERRITIN, TIBC, IRON, RETICCTPCT in the last 72 hours. ?Sepsis Labs: ?No results for input(s): PROCALCITON, LATICACIDVEN in the last 168 hours. ? ?No results found for this or any previous visit (from the past 240 hour(s)).  ? ? ? ? ? ?Radiology Studies: ?No results  found. ? ? ? ? ? ?Scheduled Meds: ? allopurinol  300 mg Oral Daily  ? amLODipine  10 mg Oral Daily  ? baclofen  10 mg Oral BID  ? clonazePAM  0.25 mg Oral Daily  ? dapagliflozin propanediol  5 mg Oral QAC breakfast  ? enoxaparin (LOVENOX) injection  0.5 mg/kg Subcutaneous Q24H  ? EPINEPHrine  0.3 mg Intramuscular Once  ? insulin aspart  0-20 Units Subcutaneous TID WC  ? QUEtiapine  200 mg Oral QHS  ? sertraline  100 mg Oral Daily  ? sodium chloride flush  3 mL Intravenous Q12H  ? traZODone  100 mg Oral QHS  ? ?Continuous Infusions: ? sodium chloride    ? ? ? LOS: 0 days  ? ? ?Time spent: 32 mins  ? ? ? ?Wyvonnia Dusky, MD ?[facility] ?Pager [phone] ? ?If 7PM-7AM, please contact night-coverage ?07/15/2021, 8:56 AM  ? ?

## 2021-07-16 DIAGNOSIS — T783XXD Angioneurotic edema, subsequent encounter: Secondary | ICD-10-CM | POA: Diagnosis not present

## 2021-07-16 DIAGNOSIS — R400 Somnolence: Secondary | ICD-10-CM | POA: Diagnosis not present

## 2021-07-16 DIAGNOSIS — G8929 Other chronic pain: Secondary | ICD-10-CM | POA: Diagnosis not present

## 2021-07-16 LAB — CBC
HCT: 43.8 % (ref 36.0–46.0)
Hemoglobin: 13.8 g/dL (ref 12.0–15.0)
MCH: 26 pg (ref 26.0–34.0)
MCHC: 31.5 g/dL (ref 30.0–36.0)
MCV: 82.6 fL (ref 80.0–100.0)
Platelets: 374 10*3/uL (ref 150–400)
RBC: 5.3 MIL/uL — ABNORMAL HIGH (ref 3.87–5.11)
RDW: 16.4 % — ABNORMAL HIGH (ref 11.5–15.5)
WBC: 14.7 10*3/uL — ABNORMAL HIGH (ref 4.0–10.5)
nRBC: 0 % (ref 0.0–0.2)

## 2021-07-16 LAB — BASIC METABOLIC PANEL
Anion gap: 4 — ABNORMAL LOW (ref 5–15)
BUN: 14 mg/dL (ref 6–20)
CO2: 26 mmol/L (ref 22–32)
Calcium: 8.8 mg/dL — ABNORMAL LOW (ref 8.9–10.3)
Chloride: 106 mmol/L (ref 98–111)
Creatinine, Ser: 0.76 mg/dL (ref 0.44–1.00)
GFR, Estimated: >60 mL/min (ref >60)
Glucose, Bld: 334 mg/dL — ABNORMAL HIGH (ref 70–99)
Potassium: 4.1 mmol/L (ref 3.5–5.1)
Sodium: 136 mmol/L (ref 135–145)

## 2021-07-16 LAB — GLUCOSE, CAPILLARY
Glucose-Capillary: 274 mg/dL — ABNORMAL HIGH (ref 70–99)
Glucose-Capillary: 215 mg/dL — ABNORMAL HIGH (ref 70–99)
Glucose-Capillary: 319 mg/dL — ABNORMAL HIGH (ref 70–99)
Glucose-Capillary: 195 mg/dL — ABNORMAL HIGH (ref 70–99)

## 2021-07-16 MED ORDER — DOCUSATE SODIUM 100 MG PO CAPS
200.0000 mg | ORAL_CAPSULE | Freq: Two times a day (BID) | ORAL | Status: DC
  Administered 2021-07-16 – 2021-07-17 (×2): 200 mg via ORAL
  Filled 2021-07-16 (×2): qty 2

## 2021-07-16 MED ORDER — ONDANSETRON 8 MG PO TBDP
8.0000 mg | ORAL_TABLET | Freq: Three times a day (TID) | ORAL | Status: DC | PRN
  Administered 2021-07-16: 8 mg via ORAL
  Filled 2021-07-16 (×2): qty 1

## 2021-07-16 MED ORDER — INSULIN ASPART 100 UNIT/ML IJ SOLN: 0.0000 [IU] | Freq: Every day | INTRAMUSCULAR | Status: DC

## 2021-07-16 MED ORDER — POLYETHYLENE GLYCOL 3350 17 G PO PACK
17.0000 g | PACK | Freq: Every day | ORAL | Status: DC
  Administered 2021-07-17: 17 g via ORAL
  Filled 2021-07-16 (×2): qty 1

## 2021-07-16 NOTE — Progress Notes (Signed)
?PROGRESS NOTE ? ? ? ?Kelly Byrd  MVH:[id number] DOB: 1975/11/26 DOA: 07/14/2021 ?PCP: Charlott Rakes, MD ? ?Assessment & Plan: ?  ?Active Problems: ?  Angioedema ? ? 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 D/c celebrex, losartan. Continue on steroids, famotidine. EpiPen at bedside in case she does develop anaphylaxis. Much improved facial swelling today.  ? ?Somnolence: significant. Will hold multiple sedating meds- baclofen, klonopin, seroquel, trazodone & sertraline  ? ?Hypotension: resolved ? ?HTN: will restart amlodipine. D/c losartan. Never ARBs/ACE-I ?  ?Depression: severity unknown. Holding home dose of seroquel, sertraline  ?  ?DMII: likely poorly controlled. Continue on SSI w/ accuchecks  ? ?Obesity: BMI 37.4. Would benefit from weight loss  ?  ?Chronic pain: holding home dose of baclofen, klonopin. Tylenol prn  ? ?Leukocytosis: likely secondary to steroid use ? ? ?DVT prophylaxis: lovenox  ?Code Status: full  ?Family Communication:  ?Disposition Plan: likely d/c back home  ? ?Level of care: Progressive ? ?Status is: Inpatient ?Remains inpatient appropriate because: very somnolent  ? ? ? ? ? ?Consultants:  ? ? ?Procedures:  ? ?Antimicrobials:  ? ?Subjective: ?Pt c/o pain  ? ?Objective: ?Vitals:  ? 07/15/21 1658 07/15/21 2001 07/15/21 2318 07/16/21 0323  ?BP: 106/68 123/67 118/63 123/64  ?Pulse: 71 78 84 64  ?Resp:  '20 20 15  '$ ?Temp:  98.3 ?F (36.8 ?C) 98.2 ?F (36.8 ?C) 98 ?F (36.7 ?C)  ?TempSrc:      ?SpO2: 96% 96% 95% 97%  ?Weight:      ?Height:      ? ? ?Intake/Output Summary (Last 24 hours) at 07/16/2021 0724 ?Last data filed at 07/16/2021 0104 ?Gross per 24 hour  ?Intake 1443 ml  ?Output 0 ml  ?Net 1443 ml  ? ?Filed Weights  ? 07/14/21 0938  ?Weight: 98.9 kg  ? ? ?Examination: ? ?General exam: Appears lethargic  ?Respiratory system: clear breath sounds b/l  ?Cardiovascular system: S1/S2+. No rubs or clicks  ?Gastrointestinal system: Abd is soft, NT, obese & hypoactive bowel  sounds ?Central nervous system: Lethargic but oriented. Moves all extremities  ?Psychiatry: judgement and insight appears normal. Flat mood and affect ? ? ? ?Data Reviewed: I have personally reviewed following labs and imaging studies ? ?CBC: ?Recent Labs  ?Lab 07/14/21 ?8413 07/15/21 ?2440 07/16/21 ?0615  ?WBC 10.6* 17.4* 14.7*  ?NEUTROABS 6.7  --   --   ?HGB 14.4 13.5 13.8  ?HCT 45.5 43.0 43.8  ?MCV 81.3 82.4 82.6  ?PLT 357 373 374  ? ?Basic Metabolic Panel: ?Recent Labs  ?Lab 07/14/21 ?1027 07/15/21 ?2536 07/16/21 ?0615  ?NA 135 135 136  ?K 3.7 3.9 4.1  ?CL 105 106 106  ?CO2 '22 22 26  '$ ?GLUCOSE 141* 263* 334*  ?BUN '11 13 14  '$ ?CREATININE 0.59 0.69 0.76  ?CALCIUM 9.2 8.6* 8.8*  ? ?GFR: ?Estimated Creatinine Clearance: 102.6 mL/min (by C-G formula based on SCr of 0.76 mg/dL). ?Liver Function Tests: ?No results for input(s): AST, ALT, ALKPHOS, BILITOT, PROT, ALBUMIN in the last 168 hours. ?No results for input(s): LIPASE, AMYLASE in the last 168 hours. ?No results for input(s): AMMONIA in the last 168 hours. ?Coagulation Profile: ?No results for input(s): INR, PROTIME in the last 168 hours. ?Cardiac Enzymes: ?No results for input(s): CKTOTAL, CKMB, CKMBINDEX, TROPONINI in the last 168 hours. ?BNP (last 3 results) ?No results for input(s): PROBNP in the last 8760 hours. ?HbA1C: ?Recent Labs  ?  07/14/21 ?0955  ?HGBA1C 6.8*  ? ?CBG: ?Recent Labs  ?Lab  07/14/21 ?1743 07/15/21 ?0804 07/15/21 ?1129 07/15/21 ?1658 07/15/21 ?1956  ?GLUCAP 295* 319* 206* 237* 260*  ? ?Lipid Profile: ?No results for input(s): CHOL, HDL, LDLCALC, TRIG, CHOLHDL, LDLDIRECT in the last 72 hours. ?Thyroid Function Tests: ?No results for input(s): TSH, T4TOTAL, FREET4, T3FREE, THYROIDAB in the last 72 hours. ?Anemia Panel: ?No results for input(s): VITAMINB12, FOLATE, FERRITIN, TIBC, IRON, RETICCTPCT in the last 72 hours. ?Sepsis Labs: ?No results for input(s): PROCALCITON, LATICACIDVEN in the last 168 hours. ? ?No results found for this or any  previous visit (from the past 240 hour(s)).  ? ? ? ? ? ?Radiology Studies: ?No results found. ? ? ? ? ? ?Scheduled Meds: ? allopurinol  300 mg Oral Daily  ? baclofen  10 mg Oral BID  ? clonazePAM  0.25 mg Oral Daily  ? dapagliflozin propanediol  5 mg Oral QAC breakfast  ? enoxaparin (LOVENOX) injection  0.5 mg/kg Subcutaneous Q24H  ? famotidine  40 mg Oral Daily  ? insulin aspart  0-20 Units Subcutaneous TID WC  ? methylPREDNISolone (SOLU-MEDROL) injection  80 mg Intravenous Q24H  ? QUEtiapine  200 mg Oral QHS  ? sertraline  100 mg Oral Daily  ? sodium chloride flush  3 mL Intravenous Q12H  ? traZODone  100 mg Oral QHS  ? ?Continuous Infusions: ? sodium chloride    ? ? ? LOS: 1 day  ? ? ?Time spent: 25 mins  ? ? ? ?Wyvonnia Dusky, MD ?[facility] ?Pager [phone] ? ?If 7PM-7AM, please contact night-coverage ?07/16/2021, 7:24 AM  ? ?

## 2021-07-16 NOTE — Progress Notes (Signed)
Inpatient Diabetes Program Recommendations ? ?AACE/ADA: New Consensus Statement on Inpatient Glycemic Control (2015) ? ?Target Ranges:  Prepandial:   less than 140 mg/dL ?     Peak postprandial:   less than 180 mg/dL (1-2 hours) ?     Critically ill patients:  140 - 180 mg/dL  ? ?Lab Results  ?Component Value Date  ? GLUCAP 274 (H) 07/16/2021  ? HGBA1C 6.8 (H) 07/14/2021  ? ? ?Review of Glycemic Control ? Latest Reference Range & Units 07/15/21 08:04 07/15/21 11:29 07/15/21 16:58 07/15/21 19:56 07/16/21 07:59  ?Glucose-Capillary 70 - 99 mg/dL 319 (H) 206 (H) 237 (H) 260 (H) 274 (H)  ?(H): Data is abnormally high ? ?Diabetes history: DM2 ?Outpatient Diabetes medications: Farxiga 5 mg qd ?Current orders for Inpatient glycemic control: Novolog 0-20 units tid correction, Solumedrol 80 mg qd ? ?Inpatient Diabetes Program Recommendations:   ?CBGs > 180 with steroids. ?Please consider: ?-Add Novolog 0-5 units hs correction ?Secure chat sent to Dr. Jimmye Norman. ? ?Thank you, ?Nani Gasser Huxley Vanwagoner, RN, MSN, CDE  ?Diabetes Coordinator ?Inpatient Glycemic Control Team ?Team Pager [phone] (8am-5pm) ?07/16/2021 9:12 AM ? ? ? ? ?

## 2021-07-17 DIAGNOSIS — I10 Essential (primary) hypertension: Secondary | ICD-10-CM | POA: Diagnosis not present

## 2021-07-17 DIAGNOSIS — T783XXD Angioneurotic edema, subsequent encounter: Secondary | ICD-10-CM | POA: Diagnosis not present

## 2021-07-17 DIAGNOSIS — R400 Somnolence: Secondary | ICD-10-CM | POA: Diagnosis not present

## 2021-07-17 LAB — CBC
HCT: 45.4 % (ref 36.0–46.0)
Hemoglobin: 14.6 g/dL (ref 12.0–15.0)
MCH: 25.8 pg — ABNORMAL LOW (ref 26.0–34.0)
MCHC: 32.2 g/dL (ref 30.0–36.0)
MCV: 80.4 fL (ref 80.0–100.0)
Platelets: 373 10*3/uL (ref 150–400)
RBC: 5.65 MIL/uL — ABNORMAL HIGH (ref 3.87–5.11)
RDW: 16.1 % — ABNORMAL HIGH (ref 11.5–15.5)
WBC: 16.7 10*3/uL — ABNORMAL HIGH (ref 4.0–10.5)
nRBC: 0 % (ref 0.0–0.2)

## 2021-07-17 LAB — GLUCOSE, CAPILLARY
Glucose-Capillary: 178 mg/dL — ABNORMAL HIGH (ref 70–99)
Glucose-Capillary: 157 mg/dL — ABNORMAL HIGH (ref 70–99)

## 2021-07-17 LAB — URINE CULTURE

## 2021-07-17 LAB — BASIC METABOLIC PANEL
Anion gap: 7 (ref 5–15)
BUN: 20 mg/dL (ref 6–20)
CO2: 27 mmol/L (ref 22–32)
Calcium: 9 mg/dL (ref 8.9–10.3)
Chloride: 102 mmol/L (ref 98–111)
Creatinine, Ser: 0.71 mg/dL (ref 0.44–1.00)
GFR, Estimated: >60 mL/min (ref >60)
Glucose, Bld: 257 mg/dL — ABNORMAL HIGH (ref 70–99)
Potassium: 4 mmol/L (ref 3.5–5.1)
Sodium: 136 mmol/L (ref 135–145)

## 2021-07-17 MED ORDER — PREDNISONE 10 MG PO TABS: ORAL_TABLET | ORAL | 0 refills | Status: DC

## 2021-07-17 MED ORDER — METHYLPREDNISOLONE SODIUM SUCC 40 MG IJ SOLR: 40.0000 mg | INTRAMUSCULAR | Status: DC

## 2021-07-17 NOTE — Discharge Summary (Signed)
Physician Discharge Summary  ?Kelly Byrd UEK:[id number] DOB: 10-20-1975 DOA: 07/14/2021 ? ?PCP: Kelly Byrd ? ?Admit date: 07/14/2021 ?Discharge date: 07/17/2021 ? ?Admitted From: home  ?Disposition:  home  ? ?Recommendations for Outpatient Follow-up:  ?Follow up with PCP in 1 week ? ?Home Health: no  ?Equipment/Devices: ? ?Discharge Condition: stable  ?CODE STATUS: full  ?Diet recommendation: Heart Healthy / Carb Modified  ? ?Brief/Interim Summary: ?HPI was taken from Dr. Jamse Byrd: ?Kelly Byrd is an 46 y.o. female with asthma, HTN, chronic LBP, migraines and recurrent angioedema who was in her [facility] until 4 days ago when she noted an area of swelling possible urticaria on left lower abdomen.  Patient self treated with Benadryl.  She subsequently noted onset of swelling in her lips and a little bit in her eyelids which she also self treated with Benadryl.  Patient states she use Benadryl frequently over the course of the weekend but has had progressive swelling of her lips and eyelids.  Today she had swelling at the bottom of her mouth which concerned her enough to come to the ED. ?  ?Patient denies shortness of breath per se.  Denies wheezing.  Her biggest concern is the swelling of her tongue.  In the ED she was treated with epi with may be some improvement, patient states she does not feel like she is any better.  Her most biggest concern is the pain at site of epi shot. ?  ?At present patient states she feels sleepy, probably from Benadryl.  Denies any difficulty breathing.  Denies nausea or vomiting.  No abdominal pain.  No diarrhea.  Patient denies pruritus. ?  ?Notes she has had this urticaria on and off for many years, had childhood peanut allergy.  Patient states she had been seen by an allergist several years ago and was told she was allergic to many things including multiple vegetables and fruit, nuts and possibly meat.  Patient states that she never allows anyone to  cook for her and only cooks for herself knowing what her allergies are.  Patient does not think she has had any foods to which she is allergic over the weekend.  Of note patient does have a fast food bag at bedside which she states has chicken.  Notes she never eats red meat only chicken and fish although admits to having half a cheeseburger yesterday.  Does admit to taking both Celebrex and meloxicam intermittently. ?  ?ED Course:  The patient was treated with subcu and inhalational racemic epi IV Benadryl, Solu-Medrol and H2 blockers.  Patient with persistent significant bilateral eyelid edema although apparently has decreased tongue swelling.  Is now brought in for observation overnight. ? ? ?As per Dr. Jimmye Byrd 4/19-4/21/23: Pt was found to have recurrent angioedema. Pt was taking losartan at home for HTN. Pt was treated w/ steroids, famotidine & epipen. Pt's angioedema improved w/ IV steroids & famotidine. Pt was d/c home po steroid course to complete the taper. Pt received education on ever taking anymore ARBs and ACE-I. Pt verbalized her understanding.  ? ?Discharge Diagnoses:  ?Active Problems: ?  Angioedema ? 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 D/c celebrex, losartan. Continue on steroids, famotidine. EpiPen at bedside in case she does develop anaphylaxis. Facial swelling continues to improve  ?  ?Somnolence: much improved today  ?  ?Hypotension: resolved ?  ?HTN: will continue amlodipine. D/c losartan. Never ARBs/ACE-I ?  ?Depression: severity unknown. Continue on seroquel, sertraline  ?  ?DM2:  likely poorly controlled. Continue on SSI w/ accuchecks  ?  ?Obesity: BMI 37.4. Would benefit from weight loss  ?  ?Chronic pain: restart home dose of baclofen, klonopin. Tylenol prn  ?  ?Leukocytosis: likely secondary to steroid use ? ?Discharge Instructions ? ?Discharge Instructions   ? ? Diet - low sodium heart healthy   Complete by: As directed ?  ? Diet Carb Modified   Complete by: As  directed ?  ? Discharge instructions   Complete by: As directed ?  ? F/u w/ PCP in 1 week. NEVER ANYMORE ACE-I AND ARBs as pt has angioedema to all of these medications.  ? Increase activity slowly   Complete by: As directed ?  ? ?  ? ?Allergies as of 07/17/2021   ? ?   Reactions  ? Ace Inhibitors Swelling  ? Throat and face  ? Angiotensin Receptor Blockers Anaphylaxis  ? Lisinopril Other (See Comments)  ? Swelling--caused the pt to stay in the hospital  ? Peanuts [peanut Oil] Other (See Comments)  ? Swelling--caused the pt to stay in the hospital  ? ?  ? ?  ?Medication List  ?  ? ?STOP taking these medications   ? ?acetaminophen-codeine 300-30 MG tablet ?Commonly known as: TYLENOL #3 ?  ?ciprofloxacin-dexamethasone OTIC suspension ?Commonly known as: Ciprodex ?  ?gabapentin 300 MG capsule ?Commonly known as: NEURONTIN ?  ?montelukast 10 MG tablet ?Commonly known as: SINGULAIR ?  ?nitrofurantoin (macrocrystal-monohydrate) 100 MG capsule ?Commonly known as: Macrobid ?  ?oxyCODONE-acetaminophen 7.5-325 MG tablet ?Commonly known as: PERCOCET ?  ?SUMAtriptan 100 MG tablet ?Commonly known as: Imitrex ?  ?topiramate 50 MG tablet ?Commonly known as: Topamax ?  ? ?  ? ?TAKE these medications   ? ?allopurinol 300 MG tablet ?Commonly known as: ZYLOPRIM ?Take 1 tablet (300 mg total) by mouth daily. ?  ?amLODipine 10 MG tablet ?Commonly known as: NORVASC ?Take 1 tablet by mouth once daily ?  ?atorvastatin 20 MG tablet ?Commonly known as: LIPITOR ?Take 1 tablet (20 mg total) by mouth daily. ?  ?baclofen 10 MG tablet ?Commonly known as: LIORESAL ?Take 10 mg by mouth in the morning and at bedtime. ?  ?cetirizine 10 MG tablet ?Commonly known as: ZYRTEC ?Take 1 tablet by mouth once daily ?  ?clonazePAM 0.5 MG tablet ?Commonly known as: KLONOPIN ?Take 0.25 mg by mouth daily. ?  ?dapagliflozin propanediol 5 MG Tabs tablet ?Commonly known as: [place] ?Take 1 tablet (5 mg total) by mouth daily before breakfast. ?  ?diclofenac Sodium 1 %  Gel ?Commonly known as: VOLTAREN ?Apply 1 application topically 4 (four) times daily as needed (pain.). ?  ?Dulera 200-5 MCG/ACT Aero ?Generic drug: mometasone-formoterol ?INHALE 2 PUFFS BY MOUTH IN THE MORNING AND AT BEDTIME ?  ?EPINEPHrine 0.3 mg/0.3 mL Soaj injection ?Commonly known as: EPI-PEN ?Inject 0.3 mLs (0.3 mg total) into the muscle as needed for anaphylaxis. ?  ?famotidine 20 MG tablet ?Commonly known as: PEPCID ?Take 1 tablet (20 mg total) by mouth 2 (two) times daily. ?  ?fluticasone 50 MCG/ACT nasal spray ?Commonly known as: FLONASE ?Use 2 spray(s) in each nostril once daily ?  ?Misc. Devices Misc ?Rolling walker with seat ?  ?Potassium Chloride ER 20 MEQ Tbcr ?Take 1 tablet by mouth once daily ?  ?predniSONE 10 MG tablet ?Commonly known as: DELTASONE ?'40mg'$  daily x 1 day, '30mg'$  daily x 1 day, '20mg'$  daily x 1 day, '10mg'$  daily x 1 day then stop ?What changed:  ?medication strength ?how  much to take ?how to take this ?when to take this ?additional instructions ?  ?QUEtiapine 200 MG tablet ?Commonly known as: SEROQUEL ?Take 200 mg by mouth at bedtime. ?  ?sertraline 100 MG tablet ?Commonly known as: ZOLOFT ?Take 100 mg by mouth daily. ?  ?traZODone 100 MG tablet ?Commonly known as: DESYREL ?Take 1 tablet (100 mg total) by mouth at bedtime. ?  ?True Metrix Blood Glucose Test test strip ?Generic drug: glucose blood ?1 each by Other route 3 (three) times daily. ?  ?True Metrix Meter Devi ?1 each by Does not apply route 3 (three) times daily. ?  ?TRUEplus Lancets 28G Misc ?1 each by Does not apply route 3 (three) times daily. ?  ?Ventolin HFA 108 (90 Base) MCG/ACT inhaler ?Generic drug: albuterol ?INHALE 2 PUFFS BY MOUTH EVERY 6 HOURS AS NEEDED FOR WHEEZING OR SHORTNESS OF BREATH ?  ?albuterol (2.5 MG/3ML) 0.083% nebulizer solution ?Commonly known as: PROVENTIL ?Take 3 mLs (2.5 mg total) by nebulization every 6 (six) hours as needed for wheezing or shortness of breath (wheezing). ?  ? ?  ? ? ?Allergies   ?Allergen Reactions  ? Ace Inhibitors Swelling  ?  Throat and face  ? Angiotensin Receptor Blockers Anaphylaxis  ? Lisinopril Other (See Comments)  ?  Swelling--caused the pt to stay in the hospital  ? Peanuts [Pe

## 2021-07-17 NOTE — TOC Initial Note (Signed)
Transition of Care (TOC) - Initial/Assessment Note  ? ? ?Patient Details  ?Name: Kelly Byrd ?MRN: [id number] ?Date of Birth: [birth year] ? ?Transition of Care (TOC) CM/SW Contact:    ?Laurena Slimmer, RN ?Phone Number: ?07/17/2021, 2:10 PM ? ?Clinical Narrative:                 ? ? ?Transition of Care (TOC) Screening Note ? ? ?Patient Details  ?Name: Kelly Byrd ?Date of Birth: [birth year] ? ? ?Transition of Care (TOC) CM/SW Contact:    ?Laurena Slimmer, RN ?Phone Number: ?07/17/2021, 2:10 PM ? ? ? ?Transition of Care Department [facility]) has reviewed patient and no TOC needs have been identified at this time. We will continue to monitor patient advancement through interdisciplinary progression rounds. If new patient transition needs arise, please place a TOC consult. ?  ?  ?  ? ? ?Patient Goals and CMS Choice ?  ?  ?  ? ?Expected Discharge Plan and Services ?  ?  ?  ?  ?  ?Expected Discharge Date: 07/17/21               ?  ?  ?  ?  ?  ?  ?  ?  ?  ?  ? ?Prior Living Arrangements/Services ?  ?  ?  ?       ?  ?  ?  ?  ? ?Activities of Daily Living ?Home Assistive Devices/Equipment: Gilford Rile (specify type), Cane (specify quad or straight) ?ADL Screening (condition at time of admission) ?Patient's cognitive ability adequate to safely complete daily activities?: Yes ?Is the patient deaf or have difficulty hearing?: No ?Does the patient have difficulty seeing, even when wearing glasses/contacts?: No ?Does the patient have difficulty concentrating, remembering, or making decisions?: No ?Patient able to express need for assistance with ADLs?: Yes ?Does the patient have difficulty dressing or bathing?: No ?Independently performs ADLs?: Yes (appropriate for developmental age) ?Does the patient have difficulty walking or climbing stairs?: Yes ?Weakness of Legs: Both ?Weakness of Arms/Hands: None ? ?Permission Sought/Granted ?  ?  ?   ?   ?   ?   ? ?Emotional Assessment ?  ?  ?  ?  ?  ?  ? ?Admission diagnosis:   Angioedema [T78.3XXA] ?Anaphylaxis, initial encounter [T78.2XXA] ?Angioedema, initial encounter [T78.3XXA] ?Patient Active Problem List  ? Diagnosis Date Noted  ? Rhabdomyolysis 10/16/2020  ? Syncope and collapse 10/16/2020  ? S/P abdominal supracervical subtotal hysterectomy 10/23/2019  ? Abdominal adhesions   ? Abnormal uterine bleeding (AUB) 10/11/2019  ? Intramural leiomyoma of uterus 10/11/2019  ? Angioedema 06/26/2019  ? Facial swelling   ? Allergic reaction 06/25/2019  ? Hypokalemia 06/25/2019  ? Abdominal pain 06/25/2019  ? 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 Cocaine abuse ([place]) 10/11/2017  ? Lumbar radiculopathy 05/31/2017  ? Hyperlipidemia 05/31/2017  ? GERD (gastroesophageal reflux disease) 08/05/2016  ? Fibroids 09/19/2015  ? Hypertension 08/29/2015  ? Asthma 08/29/2015  ? Back pain 08/29/2015  ? 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 Anemia, iron deficiency 10/29/2014  ? Menometrorrhagia 10/29/2014  ? Migraine 10/27/2014  ? ?PCP:  [facility] ?Pharmacy:   ?[facility] ([facility] [place] [address] ?[address] [place] [address] ?Phone: [phone] Fax: [phone] ? ?[facility] ?[address], [address] ?[place] [address] ?Phone: [phone] Fax: [phone] ? ?[facility] [address]  [place], [place] [address] ?[place] ?[place] [address] ?Phone: [phone] Fax: [phone] ? ? ? ? ?Social Determinants of Health (SDOH) Interventions ?  ? ?Readmission Risk Interventions ? ?  06/27/2019  ? 10:41 AM  ?Readmission Risk Prevention Plan  ?Transportation Screening Complete  ?Palliative Care Screening Not Applicable  ?Medication Review Press photographer) Complete  ? ? ? ?

## 2021-07-20 ENCOUNTER — Other Ambulatory Visit: Payer: Self-pay | Admitting: Family Medicine

## 2021-07-20 MED ORDER — EPINEPHRINE 0.3 MG/0.3ML IJ SOAJ: 0.3000 mg | INTRAMUSCULAR | 2 refills | Status: DC | PRN

## 2021-07-20 NOTE — Telephone Encounter (Signed)
[facility] called and stated that the patient is requesting a refill on Epi Pen. Pt was hospitalized on 4/18 because she took losartan and is allergic to it. Patient does have an appointment on 4/27 for a regular follow up visit.  ? ? ?[facility] ([facility] [place] [address]  ?[place], [place] [address]  ?

## 2021-07-20 NOTE — Telephone Encounter (Signed)
Rx sent 

## 2021-07-23 ENCOUNTER — Ambulatory Visit: Payer: Medicaid Other | Admitting: Family Medicine

## 2021-07-28 ENCOUNTER — Other Ambulatory Visit: Payer: Self-pay

## 2021-07-28 ENCOUNTER — Emergency Department
Admission: EM | Admit: 2021-07-28 | Discharge: 2021-07-28 | Disposition: A | Discharge status: Home / Self Care | Payer: Medicaid Other | Attending: Emergency Medicine | Admitting: Emergency Medicine

## 2021-07-28 DIAGNOSIS — M542 Cervicalgia: Secondary | ICD-10-CM | POA: Diagnosis present

## 2021-07-28 DIAGNOSIS — E119 Type 2 diabetes mellitus without complications: Secondary | ICD-10-CM | POA: Insufficient documentation

## 2021-07-28 DIAGNOSIS — L0291 Cutaneous abscess, unspecified: Secondary | ICD-10-CM

## 2021-07-28 DIAGNOSIS — L0211 Cutaneous abscess of neck: Secondary | ICD-10-CM | POA: Insufficient documentation

## 2021-07-28 MED ORDER — LIDOCAINE-EPINEPHRINE 2 %-1:100000 IJ SOLN
20.0000 mL | Freq: Once | INTRAMUSCULAR | Status: AC
Start: 2021-07-28 — End: 2021-07-28
  Administered 2021-07-28: 20 mL via INTRADERMAL
  Filled 2021-07-28: qty 1

## 2021-07-28 MED ORDER — HYDROCODONE-ACETAMINOPHEN 5-325 MG PO TABS
2.0000 | ORAL_TABLET | Freq: Once | ORAL | Status: AC
  Administered 2021-07-28: 2 via ORAL
  Filled 2021-07-28 (×2): qty 2

## 2021-07-28 MED ORDER — IBUPROFEN 600 MG PO TABS: 600.0000 mg | ORAL_TABLET | Freq: Three times a day (TID) | ORAL | 0 refills | Status: DC | PRN

## 2021-07-28 MED ORDER — DOXYCYCLINE HYCLATE 100 MG PO CAPS: 100.0000 mg | ORAL_CAPSULE | Freq: Two times a day (BID) | ORAL | 0 refills | Status: AC

## 2021-07-28 NOTE — Discharge Instructions (Signed)
Remove packing in 48 hours ? ?Apply warm compresses to the area until healed ? ?Take the antibiotics as prescribed ? ?Avoid necklaces or shirts that hit at the area of the wound ?

## 2021-07-28 NOTE — ED Provider Notes (Signed)
? ?[facility] ?Provider Note ? ? ? Event Date/Time  ? First MD Initiated Contact with Patient 07/28/21 [phone]   ?  (approximate) ? ? ?History  ? ?Abscess ? ? ?HPI ? ?Kelly Byrd is a 46 y.o. female  with PMHx prior sebaceous cyst removal here with neck pain, swelling. Pt reports that over the last several days, she's had increasing pain,swelling along her posterior neck. Over the past 24 hours, pt has had increasing pain, redness in the area. No fevers. H/o recently diagnosed but not insulin dependent diabetes. No chills. No open wounds. No h/o MRSA. No recent abx use. Pain is worse with palpation, movement. No alleviating factors.  ? ?  ? ? ?Physical Exam  ? ?Triage Vital Signs: ?ED Triage Vitals  ?Enc Vitals Group  ?   BP 07/28/21 0706 (!) 153/87  ?   Pulse Rate 07/28/21 0705 78  ?   Resp 07/28/21 0705 17  ?   Temp 07/28/21 0705 98.5 ?F (36.9 ?C)  ?   Temp Source 07/28/21 0705 Oral  ?   SpO2 07/28/21 0705 97 %  ?   Weight 07/28/21 0706 220 lb (99.8 kg)  ?   Height 07/28/21 0706 '5\' 4"'$  (1.626 m)  ?   Head Circumference --   ?   Peak Flow --   ?   Pain Score 07/28/21 0706 9  ?   Pain Loc --   ?   Pain Edu? --   ?   Excl. in [place]? --   ? ? ?Most recent vital signs: ?Vitals:  ? 07/28/21 0705 07/28/21 0706  ?BP:  (!) 153/87  ?Pulse: 78   ?Resp: 17   ?Temp: 98.5 ?F (36.9 ?C)   ?SpO2: 97%   ? ? ? ?General: Awake, no distress.  ?CV:  Good peripheral perfusion. RRR, no murmurs. ?Resp:  Normal effort. Lungs CTAB. ?Abd:  No distention. No tenderness. ?Other:  Approx 2 x 1 cm indurated, fluctuance area to the base of the right neck. No open wounds. No crepitance.  ? ? ?ED Results / Procedures / Treatments  ? ?Labs ?(all labs ordered are listed, but only abnormal results are displayed) ?Labs Reviewed - No data to display ? ? ?EKG ? ? ? ?RADIOLOGY ? ? ? ?PROCEDURES: ? ?Critical Care performed: No ? ?Marland Kitchen.Incision and Drainage ? ?Date/Time: 07/28/2021 8:17 AM ?Performed by: Duffy Bruce,  MD ?Authorized by: Duffy Bruce, MD  ? ?Consent:  ?  Consent obtained:  Verbal ?  Consent given by:  Patient ?  Risks discussed:  Bleeding, damage to other organs, incomplete drainage, infection and pain ?  Alternatives discussed:  Alternative treatment and delayed treatment ?Universal protocol:  ?  Patient identity confirmed:  Verbally with patient and arm band ?Location:  ?  Type:  Abscess ?  Size:  2 x 1 ?  Location:  Neck ?  Neck location:  R posterior ?Pre-procedure details:  ?  Skin preparation:  Betadine ?Anesthesia:  ?  Anesthesia method:  Local infiltration ?  Local anesthetic:  Lidocaine 1% WITH epi ?Procedure type:  ?  Complexity:  Simple ?Procedure details:  ?  Incision types:  Single straight ?  Incision depth:  Dermal ?  Wound management:  Probed and deloculated and irrigated with saline ?  Drainage:  Purulent ?  Drainage amount:  Copious ?  Wound treatment:  Wound left open ?  Packing materials:  1/4 in iodoform gauze ?Post-procedure details:  ?  Procedure  completion:  Tolerated well, no immediate complications ? ? ? ?MEDICATIONS ORDERED IN ED: ?Medications  ?lidocaine-EPINEPHrine (XYLOCAINE W/EPI) 2 %-1:100000 (with pres) injection 20 mL (20 mLs Intradermal Given by Other 07/28/21 0750)  ?HYDROcodone-acetaminophen (NORCO/VICODIN) 5-325 MG per tablet 2 tablet (2 tablets Oral Given 07/28/21 0749)  ? ? ? ?IMPRESSION / MDM / ASSESSMENT AND PLAN / ED COURSE  ?I reviewed the triage vital signs and the nursing notes. ?             ?               ? ? ?The patient is on the cardiac monitor to evaluate for evidence of arrhythmia and/or significant heart rate changes. ? ? ?Ddx:  ?Abscess, folliculitis, infected sebaceous cyst, lymphadenopathy/adenitis ? ? ?MDM:  ?46 year old female, well-appearing, here with infected sebaceous cyst versus abscess to the posterior neck.  Patient is afebrile without signs of systemic illness.  She has no evidence to suggest deep neck infection.  There is no surrounding  crepitance, fever, tachycardia, or signs of necrotizing infection.  She is diabetic but is not insulin-dependent.  Following informed consent, incision and drainage performed by myself.  There was copious amounts of purulent drainage, and the wound was packed with iodoform gauze.  We will place on doxycycline given her comorbidities, discharged with outpatient follow-up and good return precautions. ? ? ?MEDICATIONS GIVEN IN ED: ?Medications  ?lidocaine-EPINEPHrine (XYLOCAINE W/EPI) 2 %-1:100000 (with pres) injection 20 mL (20 mLs Intradermal Given by Other 07/28/21 0750)  ?HYDROcodone-acetaminophen (NORCO/VICODIN) 5-325 MG per tablet 2 tablet (2 tablets Oral Given 07/28/21 0749)  ? ? ? ?Consults:  ? ? ? ?EMR reviewed  ?Reviewed prior records, including PCP notes with Dr. Margarita Rana as recently as January 2023 with non-insulin-dependent diabetes ? ? ? ? ?FINAL CLINICAL IMPRESSION(S) / ED DIAGNOSES  ? ?Final diagnoses:  ?Abscess  ? ? ? ?Rx / DC Orders  ? ?ED Discharge Orders   ? ?      Ordered  ?  ibuprofen (ADVIL) 600 MG tablet  Every 8 hours PRN       ? 07/28/21 0816  ?  doxycycline (VIBRAMYCIN) 100 MG capsule  2 times daily       ? 07/28/21 0816  ? ?  ?  ? ?  ? ? ? ?Note:  This document was prepared using Dragon voice recognition software and may include unintentional dictation errors. ?  ?Duffy Bruce, MD ?07/28/21 [phone] ? ?

## 2021-07-28 NOTE — ED Triage Notes (Signed)
Pt c/o swollen tender area to the posterior neck for the past couple days.  ?

## 2021-07-31 ENCOUNTER — Telehealth: Payer: Self-pay | Admitting: *Deleted

## 2021-07-31 NOTE — Telephone Encounter (Signed)
Attempt all 3 contacts list. Left message at one: [phone]. ?  ?Attempt to schedule apt with Dr. Margarita Rana for virtual apt. LVM to return call.  ?

## 2021-08-05 ENCOUNTER — Telehealth: Payer: Self-pay

## 2021-08-05 NOTE — Telephone Encounter (Signed)
Returned pt call and lvm asking for a call back at her convenience  ?

## 2021-08-05 NOTE — Telephone Encounter (Signed)
Pt called back during the lunch hour.  ?Pt would like a call back asap.  She is very upset she cannot get an insulin that Dr Margarita Rana has never prescribed. ?Confirmed pt's phone number is  ?[phone]. ?Pt aware you are at lunch and will call her back. ? ?Pt states she needs appointment asap, and the first available I could schedule was July. ?Thank you. ? ?

## 2021-08-05 NOTE — Telephone Encounter (Signed)
Copied from [place] [phone]. Topic: General - Other ?>> Aug 05, 2021  1:55 PM Tessa Lerner A wrote: ?Reason for CRM: The patient would like to speak directly with Dr Margarita Rana when possible ? ?The patient would like to address concerns related to their care, appointment scheduling and additional issues  ? ?The patient has stressed the urgency of their request  ? ?Please contact when available ?

## 2021-08-06 NOTE — Telephone Encounter (Signed)
Pt returned call to office spoke with pt and per pt she had an allergic reaction to a medication that she was prescribed and she wanted to speak with Dr. Margarita Rana in regards to some issues. Pt has been made an appt with provider for 5/18  ?

## 2021-08-06 NOTE — Telephone Encounter (Signed)
Left message on voicemail to return call.

## 2021-08-06 NOTE — Telephone Encounter (Signed)
Pt calling back to speak with Dr Margarita Rana. She would like a call back as soon as possible.  ?

## 2021-08-06 NOTE — Telephone Encounter (Signed)
Returned pt call and lvm. If pt calls back please get more information  ?

## 2021-08-13 ENCOUNTER — Ambulatory Visit: Payer: Medicaid Other | Attending: Family Medicine | Admitting: Family Medicine

## 2021-08-13 ENCOUNTER — Encounter: Payer: Self-pay | Admitting: Family Medicine

## 2021-08-13 VITALS — BP 135/88 | HR 89 | Ht 64.0 in | Wt 255.0 lb

## 2021-08-13 DIAGNOSIS — J4520 Mild intermittent asthma, uncomplicated: Secondary | ICD-10-CM

## 2021-08-13 DIAGNOSIS — F32A Depression, unspecified: Secondary | ICD-10-CM | POA: Insufficient documentation

## 2021-08-13 DIAGNOSIS — E1169 Type 2 diabetes mellitus with other specified complication: Secondary | ICD-10-CM

## 2021-08-13 DIAGNOSIS — E7800 Pure hypercholesterolemia, unspecified: Secondary | ICD-10-CM | POA: Diagnosis not present

## 2021-08-13 DIAGNOSIS — Z79899 Other long term (current) drug therapy: Secondary | ICD-10-CM | POA: Diagnosis not present

## 2021-08-13 DIAGNOSIS — I129 Hypertensive chronic kidney disease with stage 1 through stage 4 chronic kidney disease, or unspecified chronic kidney disease: Secondary | ICD-10-CM | POA: Insufficient documentation

## 2021-08-13 DIAGNOSIS — N189 Chronic kidney disease, unspecified: Secondary | ICD-10-CM | POA: Insufficient documentation

## 2021-08-13 DIAGNOSIS — I10 Essential (primary) hypertension: Secondary | ICD-10-CM | POA: Diagnosis present

## 2021-08-13 DIAGNOSIS — G8929 Other chronic pain: Secondary | ICD-10-CM | POA: Diagnosis not present

## 2021-08-13 DIAGNOSIS — M5136 Other intervertebral disc degeneration, lumbar region: Secondary | ICD-10-CM | POA: Insufficient documentation

## 2021-08-13 DIAGNOSIS — F17210 Nicotine dependence, cigarettes, uncomplicated: Secondary | ICD-10-CM | POA: Insufficient documentation

## 2021-08-13 DIAGNOSIS — Z1159 Encounter for screening for other viral diseases: Secondary | ICD-10-CM

## 2021-08-13 DIAGNOSIS — Z7984 Long term (current) use of oral hypoglycemic drugs: Secondary | ICD-10-CM | POA: Diagnosis not present

## 2021-08-13 DIAGNOSIS — E1122 Type 2 diabetes mellitus with diabetic chronic kidney disease: Secondary | ICD-10-CM | POA: Diagnosis not present

## 2021-08-13 DIAGNOSIS — Z7951 Long term (current) use of inhaled steroids: Secondary | ICD-10-CM | POA: Diagnosis not present

## 2021-08-13 MED ORDER — DULERA 200-5 MCG/ACT IN AERO: INHALATION_SPRAY | RESPIRATORY_TRACT | 6 refills | Status: DC

## 2021-08-13 MED ORDER — OZEMPIC (0.25 OR 0.5 MG/DOSE) 2 MG/1.5ML ~~LOC~~ SOPN: 0.2500 mg | PEN_INJECTOR | SUBCUTANEOUS | 0 refills | Status: DC

## 2021-08-13 MED ORDER — ATORVASTATIN CALCIUM 20 MG PO TABS: 20.0000 mg | ORAL_TABLET | Freq: Every day | ORAL | 1 refills | Status: DC

## 2021-08-13 MED ORDER — AMLODIPINE BESYLATE 10 MG PO TABS: 10.0000 mg | ORAL_TABLET | Freq: Every day | ORAL | 1 refills | Status: DC

## 2021-08-13 MED ORDER — OZEMPIC (0.25 OR 0.5 MG/DOSE) 2 MG/1.5ML ~~LOC~~ SOPN: 0.5000 mg | PEN_INJECTOR | SUBCUTANEOUS | 6 refills | Status: DC

## 2021-08-13 NOTE — Progress Notes (Signed)
Subjective:  Patient ID: Kelly Byrd, female    DOB: [birth year]  Age: 46 y.o. MRN: [id number]  CC: Hospitalization Follow-up   HPI Kelly Byrd is a 46 y.o. year old female with a history of hypertension, asthma, migraine, anemia (secondary to menorrhagia from fibroids), chronic low back pain from lumbar degenerative disc disease, slipped capital femoral epiphysis, type 2 diabetes mellitus (A1c 6.8). She was hospitalized for angioedema secondary to ARB last month treated with racemic Epinephrine, IV steroids, famotidine. Losartan was discontinued as well as Celebrex. She was discharged home on steroid taper.  Interval History: Today she reports not being happy that she was on an ARB which caused an allergic reaction.  She has been paranoid about taking any medication since her hospitalization  She states she had called the clinic to speak to me but was not allowed to speak to me and appointment was scheduled for her.  I explained to her and never received her message and explained that while ARBs and ACEI are great medications some people unfortunately developed an allergic reaction and it was unfortunate that she did and she understood.  She informs me she has been adherent with [facility] as her blood sugars have been elevated in the mid to upper 200s ever since her hospitalization.  Denies hypoglycemic episodes.  She is also taking medications from her pain clinic. Closely followed by her mental health doctor and she states she is taking her antidepressants as well.  She is unhappy about being unable to lose weight and had informed her psychiatrist that she would like to come off Seroquel which was responsible for her increased appetite. I had received correspondence from her orthopedic from [facility] with recommendations for weight loss prior to scheduling her for elective hip surgery.  She had an ED visit 2 weeks ago for posterior neck abscess which was drained.   She still has a swelling at that spot and developed another lesion at the adjacent to that but without drainage, no fever and she has been using topical antibiotic cream. Past Medical History:  Diagnosis Date   Asthma    Back pain    Chronic kidney disease    frequent infrections   Depression    Dislocation of hip (HCC)    right hip   GERD (gastroesophageal reflux disease)    Hypertension    Migraines    Pre-diabetes    Sickle cell trait ([place])     Past Surgical History:  Procedure Laterality Date   CESAREAN SECTION     2   CYSTOSCOPY N/A 10/23/2019   Procedure: CYSTOSCOPY;  Surgeon: Malachy Mood, MD;  Location: [facility] ORS;  Service: Gynecology;  Laterality: N/A;   TOTAL LAPAROSCOPIC HYSTERECTOMY WITH SALPINGECTOMY Bilateral 10/23/2019   Procedure: TOTAL LAPAROSCOPIC HYSTERECTOMY CONVERTED TO OPEN SUPRACERVICAL HYSTERECTOMY;  Surgeon: Malachy Mood, MD;  Location: [facility] ORS;  Service: Gynecology;  Laterality: Bilateral;   TUBAL LIGATION     upper teeth      Family History  Problem Relation Age of Onset   Hypertension Mother    Hypertension Father    Breast cancer Maternal Grandmother    Diabetes Mellitus II Maternal Grandmother    Breast cancer Paternal Grandmother    Lupus Other     Social History   Socioeconomic History   Marital status: Married    Spouse name: Not on file   Number of children: Not on file   Years of education: Not on file   Highest  education level: Not on file  Occupational History   Occupation: disabled  Tobacco Use   Smoking status: Every Day    Packs/day: 0.25    Types: Cigarettes   Smokeless tobacco: Never  Vaping Use   Vaping Use: Every day  Substance and Sexual Activity   Alcohol use: Yes    Alcohol/week: 1.0 standard drink    Types: 1 Cans of beer per week   Drug use: Not Currently    Types: Cocaine    Comment: + cocaine 09/2017   Denies use on 7/21 2021   Sexual activity: Yes    Birth control/protection: Surgical  Other  Topics Concern   Not on file  Social History Narrative   Not on file   Social Determinants of Health   Financial Resource Strain: Not on file  Food Insecurity: Not on file  Transportation Needs: Not on file  Physical Activity: Not on file  Stress: Not on file  Social Connections: Not on file    Allergies  Allergen Reactions   Ace Inhibitors Swelling    Throat and face   Angiotensin Receptor Blockers Anaphylaxis   Lisinopril Other (See Comments)    Swelling--caused the pt to stay in the hospital   Peanuts [Peanut Oil] Other (See Comments)    Swelling--caused the pt to stay in the hospital    Outpatient Medications Prior to Visit  Medication Sig Dispense Refill   albuterol (PROVENTIL) (2.5 MG/3ML) 0.083% nebulizer solution Take 3 mLs (2.5 mg total) by nebulization every 6 (six) hours as needed for wheezing or shortness of breath (wheezing). 75 mL 2   allopurinol (ZYLOPRIM) 300 MG tablet Take 1 tablet (300 mg total) by mouth daily. 30 tablet 6   baclofen (LIORESAL) 10 MG tablet Take 10 mg by mouth in the morning and at bedtime.      Blood Glucose Monitoring Suppl (TRUE METRIX METER) DEVI 1 each by Does not apply route 3 (three) times daily. 1 each 0   cetirizine (ZYRTEC) 10 MG tablet Take 1 tablet by mouth once daily 90 tablet 0   clonazePAM (KLONOPIN) 0.5 MG tablet Take 0.25 mg by mouth daily.     dapagliflozin propanediol (FARXIGA) 5 MG TABS tablet Take 1 tablet (5 mg total) by mouth daily before breakfast. 30 tablet 3   diclofenac Sodium (VOLTAREN) 1 % GEL Apply 1 application topically 4 (four) times daily as needed (pain.).      EPINEPHrine 0.3 mg/0.3 mL IJ SOAJ injection Inject 0.3 mg into the muscle as needed for anaphylaxis. 2 each 2   fluticasone (FLONASE) 50 MCG/ACT nasal spray Use 2 spray(s) in each nostril once daily 16 g 3   glucose blood (TRUE METRIX BLOOD GLUCOSE TEST) test strip 1 each by Other route 3 (three) times daily. 100 each 12   ibuprofen (ADVIL) 600 MG  tablet Take 1 tablet (600 mg total) by mouth every 8 (eight) hours as needed for moderate pain. 20 tablet 0   Misc. Devices MISC Rolling walker with seat 1 each 0   Potassium Chloride ER 20 MEQ TBCR Take 1 tablet by mouth once daily 90 tablet 0   predniSONE (DELTASONE) 10 MG tablet 60m daily x 1 day, 32mdaily x 1 day, 2078maily x 1 day, 8m54mily x 1 day then stop 10 tablet 0   QUEtiapine (SEROQUEL) 200 MG tablet Take 200 mg by mouth at bedtime.     sertraline (ZOLOFT) 100 MG tablet Take 100 mg by mouth daily.  traZODone (DESYREL) 100 MG tablet Take 1 tablet (100 mg total) by mouth at bedtime. 30 tablet 0   TRUEplus Lancets 28G MISC 1 each by Does not apply route 3 (three) times daily. 100 each 11   VENTOLIN HFA 108 (90 Base) MCG/ACT inhaler INHALE 2 PUFFS BY MOUTH EVERY 6 HOURS AS NEEDED FOR WHEEZING OR SHORTNESS OF BREATH 18 g 0   amLODipine (NORVASC) 10 MG tablet Take 1 tablet by mouth once daily 30 tablet 1   atorvastatin (LIPITOR) 20 MG tablet Take 1 tablet (20 mg total) by mouth daily. 30 tablet 3   mometasone-formoterol (DULERA) 200-5 MCG/ACT AERO INHALE 2 PUFFS BY MOUTH IN THE MORNING AND AT BEDTIME 13 g 3   famotidine (PEPCID) 20 MG tablet Take 1 tablet (20 mg total) by mouth 2 (two) times daily. 60 tablet 0   No facility-administered medications prior to visit.     ROS Review of Systems  Constitutional:  Negative for activity change, appetite change and fatigue.  HENT:  Negative for congestion, sinus pressure and sore throat.   Eyes:  Negative for visual disturbance.  Respiratory:  Negative for cough, chest tightness, shortness of breath and wheezing.   Cardiovascular:  Negative for chest pain and palpitations.  Gastrointestinal:  Negative for abdominal distention, abdominal pain and constipation.  Endocrine: Negative for polydipsia.  Genitourinary:  Negative for dysuria and frequency.  Musculoskeletal:  Positive for back pain. Negative for arthralgias.       Bilateral  hip pain  Skin:  Positive for rash.  Neurological:  Negative for tremors, light-headedness and numbness.  Hematological:  Does not bruise/bleed easily.  Psychiatric/Behavioral:  Negative for agitation and behavioral problems.    Objective:  BP 135/88   Pulse 89   Ht _0  (1.626 m)   Wt 255 lb (115.7 kg)   LMP 10/04/2019   SpO2 99%   BMI 43.77 kg/m      08/13/2021   10:49 AM 07/28/2021    8:36 AM 07/28/2021    7:06 AM  BP/Weight  Systolic BP 256 389 373  Diastolic BP 88 78 87  Wt. (Lbs) 255  220  BMI 43.77 kg/m2  37.76 kg/m2      Physical Exam Constitutional:      Appearance: She is well-developed.  Cardiovascular:     Rate and Rhythm: Normal rate.     Heart sounds: Normal heart sounds. No murmur heard. Pulmonary:     Effort: Pulmonary effort is normal.     Breath sounds: Normal breath sounds. No wheezing or rales.  Chest:     Chest wall: No tenderness.  Abdominal:     General: Bowel sounds are normal. There is no distension.     Palpations: Abdomen is soft. There is no mass.     Tenderness: There is no abdominal tenderness.  Musculoskeletal:     Right lower leg: No edema.     Left lower leg: No edema.  Skin:    Comments: Furuncle on posterior neck x2.  No tenderness, no discharge.  Neurological:     Mental Status: She is alert and oriented to person, place, and time.  Psychiatric:        Mood and Affect: Mood normal.       Latest Ref Rng & Units 07/17/2021    3:43 AM 07/16/2021    6:15 AM 07/15/2021    6:25 AM  CMP  Glucose 70 - 99 mg/dL 257   334   263  BUN 6 - 20 mg/dL _0 Creatinine 0.44 - 1.00 mg/dL 0.71   0.76   0.69    Sodium 135 - 145 mmol/L 136   136   135    Potassium 3.5 - 5.1 mmol/L 4.0   4.1   3.9    Chloride 98 - 111 mmol/L 102   106   106    CO2 22 - 32 mmol/L _1 Calcium 8.9 - 10.3 mg/dL 9.0   8.8   8.6      Lipid Panel     Component Value Date/Time   CHOL 177 10/14/2017 0619   CHOL 193 08/06/2016 0840    TRIG 106 10/14/2017 0619   HDL 45 10/14/2017 0619   HDL 44 08/06/2016 0840   CHOLHDL 3.9 10/14/2017 0619   VLDL 21 10/14/2017 0619   LDLCALC 111 (H) 10/14/2017 0619   LDLCALC 132 (H) 08/06/2016 0840    CBC    Component Value Date/Time   WBC 16.7 (H) 07/17/2021 0343   RBC 5.65 (H) 07/17/2021 0343   HGB 14.6 07/17/2021 0343   HCT 45.4 07/17/2021 0343   PLT 373 07/17/2021 0343   MCV 80.4 07/17/2021 0343   MCH 25.8 (L) 07/17/2021 0343   MCHC 32.2 07/17/2021 0343   RDW 16.1 (H) 07/17/2021 0343   LYMPHSABS 2.9 07/14/2021 0955   MONOABS 0.4 07/14/2021 0955   EOSABS 0.5 07/14/2021 0955   BASOSABS 0.1 07/14/2021 0955    Lab Results  Component Value Date   HGBA1C 6.8 (H) 07/14/2021    Assessment & Plan:  1. Essential hypertension Controlled Counseled on blood pressure goal of less than 130/80, low-sodium, DASH diet, medication compliance, 150 minutes of moderate intensity exercise per week. Discussed medication compliance, adverse effects. - CMP14+EGFR - amLODipine (NORVASC) 10 MG tablet; Take 1 tablet (10 mg total) by mouth daily.  Dispense: 90 tablet; Refill: 1  2. Pure hypercholesterolemia LDL above goal of less than 100 Lipid panel at next visit Low-cholesterol diet - atorvastatin (LIPITOR) 20 MG tablet; Take 1 tablet (20 mg total) by mouth daily.  Dispense: 90 tablet; Refill: 1  3. Mild intermittent asthma without complication Stable - mometasone-formoterol (DULERA) 200-5 MCG/ACT AERO; INHALE 2 PUFFS BY MOUTH IN THE MORNING AND AT BEDTIME  Dispense: 13 g; Refill: 6  4. Type 2 diabetes mellitus with other specified complication, without long-term current use of insulin (HCC) Controlled with A1c of 6.8 Discussed risks and benefits of GLP-1 RA and after shared decision-making she has consented to commencing Ozempic We will reassess at next visit and consider titrating Ozempic additionally and discontinuing Farxiga Counseled on Diabetic diet, my plate method, 858 minutes  of moderate intensity exercise/week Blood sugar logs with fasting goals of 80-120 mg/dl, random of less than 180 and in the event of sugars less than 60 mg/dl or greater than 400 mg/dl encouraged to notify the clinic. Advised on the need for annual eye exams, annual foot exams, Pneumonia vaccine. - Semaglutide,0.25 or 0.5MG/DOS, (OZEMPIC, 0.25 OR 0.5 MG/DOSE,) 2 MG/1.5ML SOPN; Inject 0.25 mg into the skin once a week. For 1 month then increase to 0.15m  Dispense: 2 mL; Refill: 0 - Semaglutide,0.25 or 0.5MG/DOS, (OZEMPIC, 0.25 OR 0.5 MG/DOSE,) 2 MG/1.5ML SOPN; Inject 0.5 mg into the skin once a week. After completing 1 month course of 0.227m Dispense: 2 mL; Refill: 6 - Microalbumin/Creatinine Ratio, Urine  5. Need  for hepatitis C screening test - HCV Ab w Reflex to Quant PCR    Meds ordered this encounter  Medications   Semaglutide,0.25 or 0.5MG/DOS, (OZEMPIC, 0.25 OR 0.5 MG/DOSE,) 2 MG/1.5ML SOPN    Sig: Inject 0.25 mg into the skin once a week. For 1 month then increase to 0.36m    Dispense:  2 mL    Refill:  0   Semaglutide,0.25 or 0.5MG/DOS, (OZEMPIC, 0.25 OR 0.5 MG/DOSE,) 2 MG/1.5ML SOPN    Sig: Inject 0.5 mg into the skin once a week. After completing 1 month course of 0.287m   Dispense:  2 mL    Refill:  6   amLODipine (NORVASC) 10 MG tablet    Sig: Take 1 tablet (10 mg total) by mouth daily.    Dispense:  90 tablet    Refill:  1   atorvastatin (LIPITOR) 20 MG tablet    Sig: Take 1 tablet (20 mg total) by mouth daily.    Dispense:  90 tablet    Refill:  1   mometasone-formoterol (DULERA) 200-5 MCG/ACT AERO    Sig: INHALE 2 PUFFS BY MOUTH IN THE MORNING AND AT BEDTIME    Dispense:  13 g    Refill:  6    Follow-up: Return in about 3 months (around 11/13/2021) for Chronic medical conditions.       EnCharlott RakesMD, FAAFP. [facility] [place] 08/13/2021, 12:52 PM

## 2021-08-13 NOTE — Patient Instructions (Signed)
Semaglutide Injection What is this medication? SEMAGLUTIDE (SEM a GLOO tide) treats type 2 diabetes. It works by increasing insulin levels in your body, which decreases your blood sugar (glucose). It also reduces the amount of sugar released into the blood and slows down your digestion. It can also be used to lower the risk of heart attack and stroke in people with type 2 diabetes. Changes to diet and exercise are often combined with this medication. This medicine may be used for other purposes; ask your health care provider or pharmacist if you have questions. COMMON BRAND NAME(S): OZEMPIC What should I tell my care team before I take this medication? They need to know if you have any of these conditions: Endocrine tumors (MEN 2) or if someone in your family had these tumors Eye disease, vision problems History of pancreatitis Kidney disease Stomach problems Thyroid cancer or if someone in your family had thyroid cancer An unusual or allergic reaction to semaglutide, other medications, foods, dyes, or preservatives Pregnant or trying to get pregnant Breast-feeding How should I use this medication? This medication is for injection under the skin of your upper leg (thigh), stomach area, or upper arm. It is given once every week (every 7 days). You will be taught how to prepare and give this medication. Use exactly as directed. Take your medication at regular intervals. Do not take it more often than directed. If you use this medication with insulin, you should inject this medication and the insulin separately. Do not mix them together. Do not give the injections right next to each other. Change (rotate) injection sites with each injection. It is important that you put your used needles and syringes in a special sharps container. Do not put them in a trash can. If you do not have a sharps container, call your pharmacist or care team to get one. A special MedGuide will be given to you by the  pharmacist with each prescription and refill. Be sure to read this information carefully each time. This medication comes with INSTRUCTIONS FOR USE. Ask your pharmacist for directions on how to use this medication. Read the information carefully. Talk to your pharmacist or care team if you have questions. Talk to your care team about the use of this medication in children. Special care may be needed. Overdosage: If you think you have taken too much of this medicine contact a poison control center or emergency room at once. NOTE: This medicine is only for you. Do not share this medicine with others. What if I miss a dose? If you miss a dose, take it as soon as you can within 5 days after the missed dose. Then take your next dose at your regular weekly time. If it has been longer than 5 days after the missed dose, do not take the missed dose. Take the next dose at your regular time. Do not take double or extra doses. If you have questions about a missed dose, contact your care team for advice. What may interact with this medication? Other medications for diabetes Many medications may cause changes in blood sugar, these include: Alcohol containing beverages Antiviral medications for HIV or AIDS Aspirin and aspirin-like medications Certain medications for blood pressure, heart disease, irregular heart beat Chromium Diuretics Female hormones, such as estrogens or progestins, birth control pills Fenofibrate Gemfibrozil Isoniazid Lanreotide Female hormones or anabolic steroids MAOIs like Carbex, Eldepryl, Marplan, Nardil, and Parnate Medications for weight loss Medications for allergies, asthma, cold, or cough Medications for depression,   anxiety, or psychotic disturbances Niacin Nicotine NSAIDs, medications for pain and inflammation, like ibuprofen or naproxen Octreotide Pasireotide Pentamidine Phenytoin Probenecid Quinolone antibiotics such as ciprofloxacin, levofloxacin, ofloxacin Some  herbal dietary supplements Steroid medications such as prednisone or cortisone Sulfamethoxazole; trimethoprim Thyroid hormones Some medications can hide the warning symptoms of low blood sugar (hypoglycemia). You may need to monitor your blood sugar more closely if you are taking one of these medications. These include: Beta-blockers, often used for high blood pressure or heart problems (examples include atenolol, metoprolol, propranolol) Clonidine Guanethidine Reserpine This list may not describe all possible interactions. Give your health care provider a list of all the medicines, herbs, non-prescription drugs, or dietary supplements you use. Also tell them if you smoke, drink alcohol, or use illegal drugs. Some items may interact with your medicine. What should I watch for while using this medication? Visit your care team for regular checks on your progress. Drink plenty of fluids while taking this medication. Check with your care team if you get an attack of severe diarrhea, nausea, and vomiting. The loss of too much body fluid can make it dangerous for you to take this medication. A test called the HbA1C (A1C) will be monitored. This is a simple blood test. It measures your blood sugar control over the last 2 to 3 months. You will receive this test every 3 to 6 months. Learn how to check your blood sugar. Learn the symptoms of low and high blood sugar and how to manage them. Always carry a quick-source of sugar with you in case you have symptoms of low blood sugar. Examples include hard sugar candy or glucose tablets. Make sure others know that you can choke if you eat or drink when you develop serious symptoms of low blood sugar, such as seizures or unconsciousness. They must get medical help at once. Tell your care team if you have high blood sugar. You might need to change the dose of your medication. If you are sick or exercising more than usual, you might need to change the dose of your  medication. Do not skip meals. Ask your care team if you should avoid alcohol. Many nonprescription cough and cold products contain sugar or alcohol. These can affect blood sugar. Pens should never be shared. Even if the needle is changed, sharing may result in passing of viruses like hepatitis or HIV. Wear a medical ID bracelet or chain, and carry a card that describes your disease and details of your medication and dosage times. Do not become pregnant while taking this medication. Women should inform their care team if they wish to become pregnant or think they might be pregnant. There is a potential for serious side effects to an unborn child. Talk to your care team for more information. What side effects may I notice from receiving this medication? Side effects that you should report to your care team as soon as possible: Allergic reactions--skin rash, itching, hives, swelling of the face, lips, tongue, or throat Change in vision Dehydration--increased thirst, dry mouth, feeling faint or lightheaded, headache, dark yellow or brown urine Gallbladder problems--severe stomach pain, nausea, vomiting, fever Heart palpitations--rapid, pounding, or irregular heartbeat Kidney injury--decrease in the amount of urine, swelling of the ankles, hands, or feet Pancreatitis--severe stomach pain that spreads to your back or gets worse after eating or when touched, fever, nausea, vomiting Thyroid cancer--new mass or lump in the neck, pain or trouble swallowing, trouble breathing, hoarseness Side effects that usually do not require medical   attention (report to your care team if they continue or are bothersome): Diarrhea Loss of appetite Nausea Stomach pain Vomiting This list may not describe all possible side effects. Call your doctor for medical advice about side effects. You may report side effects to FDA at 1-800-FDA-1088. Where should I keep my medication? Keep out of the reach of children. Store  unopened pens in a refrigerator between 2 and 8 degrees C (36 and 46 degrees F). Do not freeze. Protect from light and heat. After you first use the pen, it can be stored for 56 days at room temperature between 15 and 30 degrees C (59 and 86 degrees F) or in a refrigerator. Throw away your used pen after 56 days or after the expiration date, whichever comes first. Do not store your pen with the needle attached. If the needle is left on, medication may leak from the pen. NOTE: This sheet is a summary. It may not cover all possible information. If you have questions about this medicine, talk to your doctor, pharmacist, or health care provider.  2023 Elsevier/Gold Standard (2020-06-19 00:00:00)  

## 2021-08-14 LAB — CMP14+EGFR
ALT: 20 IU/L (ref 0–32)
AST: 13 IU/L (ref 0–40)
Albumin/Globulin Ratio: 1.7 (ref 1.2–2.2)
Albumin: 4.7 g/dL (ref 3.8–4.8)
Alkaline Phosphatase: 60 IU/L (ref 44–121)
BUN/Creatinine Ratio: 13 (ref 9–23)
BUN: 9 mg/dL (ref 6–24)
Bilirubin Total: 0.2 mg/dL (ref 0.0–1.2)
CO2: 25 mmol/L (ref 20–29)
Calcium: 10.4 mg/dL — ABNORMAL HIGH (ref 8.7–10.2)
Chloride: 103 mmol/L (ref 96–106)
Creatinine, Ser: 0.7 mg/dL (ref 0.57–1.00)
Globulin, Total: 2.8 g/dL (ref 1.5–4.5)
Glucose: 152 mg/dL — ABNORMAL HIGH (ref 70–99)
Potassium: 4 mmol/L (ref 3.5–5.2)
Sodium: 142 mmol/L (ref 134–144)
Total Protein: 7.5 g/dL (ref 6.0–8.5)
eGFR: 109 mL/min/{1.73_m2} (ref >59)

## 2021-08-14 LAB — MICROALBUMIN / CREATININE URINE RATIO
Creatinine, Urine: 66 mg/dL
Microalb/Creat Ratio: 158 mg/g creat — ABNORMAL HIGH (ref 0–29)
Microalbumin, Urine: 104.3 ug/mL

## 2021-08-14 LAB — HCV INTERPRETATION

## 2021-08-14 LAB — HCV AB W REFLEX TO QUANT PCR: HCV Ab: NONREACTIVE

## 2021-09-15 ENCOUNTER — Other Ambulatory Visit: Payer: Self-pay | Admitting: Family Medicine

## 2021-09-15 DIAGNOSIS — E1169 Type 2 diabetes mellitus with other specified complication: Secondary | ICD-10-CM

## 2021-10-08 ENCOUNTER — Other Ambulatory Visit: Payer: Self-pay | Admitting: Family Medicine

## 2021-10-08 NOTE — Telephone Encounter (Signed)
Requested medications are due for refill today.  yes  Requested medications are on the active medications list.  yes  Last refill. 01/04/2021 #90 0 refills  Future visit scheduled.   yes  Notes to clinic.  No pcp listed    Requested Prescriptions  Pending Prescriptions Disp Refills   Potassium Chloride ER 20 MEQ TBCR [Pharmacy Med Name: Potassium Chloride ER 20 MEQ Oral Tablet Extended Release] 90 tablet 0    Sig: Take 1 tablet by mouth once daily     Endocrinology:  Minerals - Potassium Supplementation Passed - 10/08/2021 12:52 PM      Passed - K in normal range and within 360 days    Potassium  Date Value Ref Range Status  08/13/2021 4.0 3.5 - 5.2 mmol/L Final         Passed - Cr in normal range and within 360 days    Creatinine, Ser  Date Value Ref Range Status  08/13/2021 0.70 0.57 - 1.00 mg/dL Final         Passed - Valid encounter within last 12 months    Recent Outpatient Visits           1 month ago Type 2 diabetes mellitus with other specified complication, without long-term current use of insulin ([place])   [place], [place], MD   5 months ago Type 2 diabetes mellitus with other specified complication, without long-term current use of insulin ([place])   [place], Charlane Ferretti, MD   8 months ago Otalgia of both ears   [facility] [place], Charlane Ferretti, MD   1 year ago Annual physical exam   [place], [place], MD   1 year ago Vaginal itching   [place], MD       Future Appointments             In 1 month Charlott Rakes, MD [place]

## 2021-10-19 ENCOUNTER — Ambulatory Visit: Payer: Self-pay | Admitting: *Deleted

## 2021-10-19 NOTE — Telephone Encounter (Signed)
  Chief Complaint: hives and requesting paperwork to be completed Symptoms: red raised bumps , hives to face, neck and legs. Reports her mother has hives too. Just returned from beach. Severe itching  Frequency: na  Pertinent Negatives: Patient denies fever no drainage reported  Disposition: '[]'$ ED /'[]'$ Urgent Care (no appt availability in office) / '[]'$ Appointment(In office/virtual)/ '[]'$  [place] [facility]/ '[]'$ Home Care/ '[]'$ Refused Recommended Disposition /'[x]'$ [place] [facility]/ '[]'$  Follow-up with PCP Additional Notes:   No available appt . Instructed to go to [facility] and gave address.      Reason for Disposition  [1] MODERATE-SEVERE hives persist (i.e., hives interfere with normal activities or work) AND [2] taking antihistamine (e.g., Benadryl, Claritin) > 24 hours  Answer Assessment - Initial Assessment Questions 1. APPEARANCE: "What does the rash look like?"      Red raised tiny red bumps  2. LOCATION: "Where is the rash located?"      Legs, neck , face  3. NUMBER: "How many hives are there?"      na 4. SIZE: "How big are the hives?" (inches, cm, compare to coins) "Do they all look the same or is there lots of variation in shape and size?"      na 5. ONSET: "When did the hives begin?" (Hours or days ago)       na 6. ITCHING: "Does it itch?" If Yes, ask: "How bad is the itch?"    - MILD: doesn't interfere with normal activities   - MODERATE-SEVERE: interferes with work, school, sleep, or other activities      Moderate to severe 7. RECURRENT PROBLEM: "Have you had hives before?" If Yes, ask: "When was the last time?" and "What happened that time?"      na 8. TRIGGERS: "Were you exposed to any new food, plant, cosmetic product or animal just before the hives began?"     na 9. OTHER SYMPTOMS: "Do you have any other symptoms?" (e.g., fever, tongue swelling, difficulty breathing, abdomen pain)     Face , neck , legs  10. PREGNANCY: "Is there any chance you are pregnant?" "When  was your last menstrual period?"       na  Protocols used: Hives-A-AH

## 2021-10-22 NOTE — Telephone Encounter (Signed)
Pt was called and scheduled an appointment to discuss letter.

## 2021-10-26 ENCOUNTER — Telehealth: Payer: Medicaid Other | Admitting: Family Medicine

## 2021-10-26 ENCOUNTER — Telehealth: Payer: Self-pay | Admitting: Emergency Medicine

## 2021-10-26 NOTE — Telephone Encounter (Signed)
Copied from [place] [id number]. Topic: General - Other >> Oct 26, 2021 10:57 AM Janett Billow I wrote: Reason for CRM: Pt called and stated that she never received a call today in regards to appointment today. She states that she needs a call back from the office as soon as possible. Please advise

## 2021-10-29 NOTE — Telephone Encounter (Signed)
Call placed to patient and VM was left informing patient to return call to schedule an appointment.

## 2021-11-09 ENCOUNTER — Ambulatory Visit: Payer: Medicaid Other | Admitting: Family Medicine

## 2021-12-29 ENCOUNTER — Emergency Department: Payer: Medicaid Other

## 2021-12-29 ENCOUNTER — Observation Stay
Admission: EM | Admit: 2021-12-29 | Discharge: 2021-12-30 | Discharge status: Against Medical Advice | Payer: Medicaid Other | Attending: Internal Medicine | Admitting: Internal Medicine

## 2021-12-29 ENCOUNTER — Other Ambulatory Visit: Payer: Self-pay

## 2021-12-29 DIAGNOSIS — Z7985 Long-term (current) use of injectable non-insulin antidiabetic drugs: Secondary | ICD-10-CM | POA: Diagnosis not present

## 2021-12-29 DIAGNOSIS — Z7984 Long term (current) use of oral hypoglycemic drugs: Secondary | ICD-10-CM | POA: Diagnosis not present

## 2021-12-29 DIAGNOSIS — E1122 Type 2 diabetes mellitus with diabetic chronic kidney disease: Secondary | ICD-10-CM | POA: Insufficient documentation

## 2021-12-29 DIAGNOSIS — F332 Major depressive disorder, recurrent severe without psychotic features: Secondary | ICD-10-CM | POA: Diagnosis present

## 2021-12-29 DIAGNOSIS — I10 Essential (primary) hypertension: Secondary | ICD-10-CM

## 2021-12-29 DIAGNOSIS — Z20822 Contact with and (suspected) exposure to covid-19: Secondary | ICD-10-CM | POA: Insufficient documentation

## 2021-12-29 DIAGNOSIS — Z91010 Allergy to peanuts: Secondary | ICD-10-CM | POA: Diagnosis not present

## 2021-12-29 DIAGNOSIS — T782XXA Anaphylactic shock, unspecified, initial encounter: Secondary | ICD-10-CM | POA: Diagnosis not present

## 2021-12-29 DIAGNOSIS — I129 Hypertensive chronic kidney disease with stage 1 through stage 4 chronic kidney disease, or unspecified chronic kidney disease: Secondary | ICD-10-CM | POA: Diagnosis not present

## 2021-12-29 DIAGNOSIS — T7800XA Anaphylactic reaction due to unspecified food, initial encounter: Secondary | ICD-10-CM | POA: Diagnosis not present

## 2021-12-29 DIAGNOSIS — F17210 Nicotine dependence, cigarettes, uncomplicated: Secondary | ICD-10-CM | POA: Diagnosis not present

## 2021-12-29 DIAGNOSIS — E785 Hyperlipidemia, unspecified: Secondary | ICD-10-CM | POA: Diagnosis present

## 2021-12-29 DIAGNOSIS — Z91018 Allergy to other foods: Secondary | ICD-10-CM | POA: Diagnosis not present

## 2021-12-29 DIAGNOSIS — Z79899 Other long term (current) drug therapy: Secondary | ICD-10-CM | POA: Insufficient documentation

## 2021-12-29 DIAGNOSIS — J45909 Unspecified asthma, uncomplicated: Secondary | ICD-10-CM | POA: Insufficient documentation

## 2021-12-29 DIAGNOSIS — N182 Chronic kidney disease, stage 2 (mild): Secondary | ICD-10-CM | POA: Insufficient documentation

## 2021-12-29 DIAGNOSIS — E1165 Type 2 diabetes mellitus with hyperglycemia: Secondary | ICD-10-CM | POA: Insufficient documentation

## 2021-12-29 DIAGNOSIS — E66813 Obesity, class 3: Secondary | ICD-10-CM | POA: Diagnosis present

## 2021-12-29 DIAGNOSIS — L500 Allergic urticaria: Secondary | ICD-10-CM | POA: Diagnosis present

## 2021-12-29 LAB — BASIC METABOLIC PANEL
Anion gap: 9 (ref 5–15)
BUN: 11 mg/dL (ref 6–20)
CO2: 23 mmol/L (ref 22–32)
Calcium: 9.1 mg/dL (ref 8.9–10.3)
Chloride: 105 mmol/L (ref 98–111)
Creatinine, Ser: 0.55 mg/dL (ref 0.44–1.00)
GFR, Estimated: >60 mL/min (ref >60)
Glucose, Bld: 113 mg/dL — ABNORMAL HIGH (ref 70–99)
Potassium: 3.8 mmol/L (ref 3.5–5.1)
Sodium: 137 mmol/L (ref 135–145)

## 2021-12-29 LAB — CBG MONITORING, ED: Glucose-Capillary: 293 mg/dL — ABNORMAL HIGH (ref 70–99)

## 2021-12-29 LAB — PROTIME-INR
INR: 1 (ref 0.8–1.2)
Prothrombin Time: 12.8 seconds (ref 11.4–15.2)

## 2021-12-29 LAB — CBC WITH DIFFERENTIAL/PLATELET
Abs Immature Granulocytes: 0.03 10*3/uL (ref 0.00–0.07)
Basophils Absolute: 0 10*3/uL (ref 0.0–0.1)
Basophils Relative: 0 %
Eosinophils Absolute: 0.5 10*3/uL (ref 0.0–0.5)
Eosinophils Relative: 4 %
HCT: 48.1 % — ABNORMAL HIGH (ref 36.0–46.0)
Hemoglobin: 15.2 g/dL — ABNORMAL HIGH (ref 12.0–15.0)
Immature Granulocytes: 0 %
Lymphocytes Relative: 29 %
Lymphs Abs: 3.5 10*3/uL (ref 0.7–4.0)
MCH: 25.8 pg — ABNORMAL LOW (ref 26.0–34.0)
MCHC: 31.6 g/dL (ref 30.0–36.0)
MCV: 81.7 fL (ref 80.0–100.0)
Monocytes Absolute: 0.4 10*3/uL (ref 0.1–1.0)
Monocytes Relative: 3 %
Neutro Abs: 7.6 10*3/uL (ref 1.7–7.7)
Neutrophils Relative %: 64 %
Platelets: 370 10*3/uL (ref 150–400)
RBC: 5.89 MIL/uL — ABNORMAL HIGH (ref 3.87–5.11)
RDW: 17.1 % — ABNORMAL HIGH (ref 11.5–15.5)
WBC: 12 10*3/uL — ABNORMAL HIGH (ref 4.0–10.5)
nRBC: 0 % (ref 0.0–0.2)

## 2021-12-29 LAB — CREATININE, SERUM
Creatinine, Ser: 0.88 mg/dL (ref 0.44–1.00)
GFR, Estimated: >60 mL/min (ref >60)

## 2021-12-29 LAB — RESP PANEL BY RT-PCR (FLU A&B, COVID) ARPGX2
Influenza A by PCR: NEGATIVE
Influenza B by PCR: NEGATIVE
SARS Coronavirus 2 by RT PCR: NEGATIVE

## 2021-12-29 MED ORDER — INSULIN ASPART 100 UNIT/ML IJ SOLN: 0.0000 [IU] | Freq: Three times a day (TID) | INTRAMUSCULAR | Status: DC

## 2021-12-29 MED ORDER — AMLODIPINE BESYLATE 5 MG PO TABS: 10.0000 mg | ORAL_TABLET | Freq: Every day | ORAL | Status: DC

## 2021-12-29 MED ORDER — DIPHENHYDRAMINE HCL 50 MG/ML IJ SOLN: 50.0000 mg | Freq: Three times a day (TID) | INTRAMUSCULAR | Status: DC | PRN

## 2021-12-29 MED ORDER — INSULIN ASPART 100 UNIT/ML IJ SOLN
0.0000 [IU] | Freq: Every day | INTRAMUSCULAR | Status: DC
  Administered 2021-12-29: 3 [IU] via SUBCUTANEOUS
  Filled 2021-12-29: qty 1

## 2021-12-29 MED ORDER — ATORVASTATIN CALCIUM 20 MG PO TABS: 20.0000 mg | ORAL_TABLET | Freq: Every day | ORAL | Status: DC

## 2021-12-29 MED ORDER — BACLOFEN 10 MG PO TABS
10.0000 mg | ORAL_TABLET | Freq: Two times a day (BID) | ORAL | Status: DC
  Administered 2021-12-29: 10 mg via ORAL
  Filled 2021-12-29: qty 1

## 2021-12-29 MED ORDER — ONDANSETRON HCL 4 MG PO TABS: 4.0000 mg | ORAL_TABLET | Freq: Four times a day (QID) | ORAL | Status: DC | PRN

## 2021-12-29 MED ORDER — FAMOTIDINE IN NACL 20-0.9 MG/50ML-% IV SOLN: 20.0000 mg | INTRAVENOUS | Status: DC

## 2021-12-29 MED ORDER — FAMOTIDINE IN NACL 20-0.9 MG/50ML-% IV SOLN
20.0000 mg | Freq: Once | INTRAVENOUS | Status: AC
  Administered 2021-12-29: 20 mg via INTRAVENOUS
  Filled 2021-12-29: qty 50

## 2021-12-29 MED ORDER — OXYCODONE-ACETAMINOPHEN 5-325 MG PO TABS
2.0000 | ORAL_TABLET | Freq: Three times a day (TID) | ORAL | Status: DC | PRN
  Administered 2021-12-29: 2 via ORAL
  Filled 2021-12-29: qty 2

## 2021-12-29 MED ORDER — TRAZODONE HCL 100 MG PO TABS
100.0000 mg | ORAL_TABLET | Freq: Every day | ORAL | Status: DC
  Administered 2021-12-29: 100 mg via ORAL
  Filled 2021-12-29: qty 1

## 2021-12-29 MED ORDER — ACETAMINOPHEN 325 MG PO TABS: 650.0000 mg | ORAL_TABLET | Freq: Four times a day (QID) | ORAL | Status: DC | PRN

## 2021-12-29 MED ORDER — CLONAZEPAM 0.25 MG PO TBDP: 0.2500 mg | ORAL_TABLET | Freq: Every day | ORAL | Status: DC

## 2021-12-29 MED ORDER — DAPAGLIFLOZIN PROPANEDIOL 5 MG PO TABS: 5.0000 mg | ORAL_TABLET | Freq: Every day | ORAL | Status: DC

## 2021-12-29 MED ORDER — ONDANSETRON HCL 4 MG/2ML IJ SOLN: 4.0000 mg | Freq: Four times a day (QID) | INTRAMUSCULAR | Status: DC | PRN

## 2021-12-29 MED ORDER — EPINEPHRINE 0.3 MG/0.3ML IJ SOAJ
0.3000 mg | Freq: Once | INTRAMUSCULAR | Status: AC
  Administered 2021-12-29: 0.3 mg via INTRAMUSCULAR
  Filled 2021-12-29: qty 0.3

## 2021-12-29 MED ORDER — MOMETASONE FURO-FORMOTEROL FUM 200-5 MCG/ACT IN AERO: 2.0000 | INHALATION_SPRAY | Freq: Two times a day (BID) | RESPIRATORY_TRACT | Status: DC

## 2021-12-29 MED ORDER — ENOXAPARIN SODIUM 60 MG/0.6ML IJ SOSY: 0.5000 mg/kg | PREFILLED_SYRINGE | INTRAMUSCULAR | Status: DC

## 2021-12-29 MED ORDER — ACETAMINOPHEN 325 MG RE SUPP: 650.0000 mg | Freq: Four times a day (QID) | RECTAL | Status: DC | PRN

## 2021-12-29 MED ORDER — SERTRALINE HCL 50 MG PO TABS: 100.0000 mg | ORAL_TABLET | Freq: Every day | ORAL | Status: DC

## 2021-12-29 MED ORDER — DIPHENHYDRAMINE HCL 50 MG/ML IJ SOLN
50.0000 mg | Freq: Once | INTRAMUSCULAR | Status: AC
  Administered 2021-12-29: 50 mg via INTRAVENOUS
  Filled 2021-12-29: qty 1

## 2021-12-29 MED ORDER — QUETIAPINE FUMARATE 200 MG PO TABS
200.0000 mg | ORAL_TABLET | Freq: Every day | ORAL | Status: DC
  Administered 2021-12-29: 200 mg via ORAL
  Filled 2021-12-29: qty 1

## 2021-12-29 MED ORDER — SODIUM CHLORIDE 0.9 % IV BOLUS
1000.0000 mL | Freq: Once | INTRAVENOUS | Status: AC
  Administered 2021-12-29: 1000 mL via INTRAVENOUS

## 2021-12-29 MED ORDER — POLYETHYLENE GLYCOL 3350 17 G PO PACK: 17.0000 g | PACK | Freq: Every day | ORAL | Status: DC | PRN

## 2021-12-29 MED ORDER — METHYLPREDNISOLONE SODIUM SUCC 125 MG IJ SOLR: 80.0000 mg | INTRAMUSCULAR | Status: DC

## 2021-12-29 MED ORDER — METHYLPREDNISOLONE SODIUM SUCC 125 MG IJ SOLR
125.0000 mg | INTRAMUSCULAR | Status: AC
Start: 2021-12-29 — End: 2021-12-29
  Administered 2021-12-29: 125 mg via INTRAVENOUS
  Filled 2021-12-29: qty 2

## 2021-12-29 NOTE — ED Notes (Signed)
Pt sleeping  spouse in a  recliner in room with pt.

## 2021-12-29 NOTE — Assessment & Plan Note (Signed)
Unclear etiology which triggered this, although patient has a number of different food sensitivities and allergies and was exposed to unknown food.  Likely recurrence of symptoms 2 days later is from causative agent not yet cleared from patient's system.  Agree with monitor overnight.  Continue steroids and fluids.

## 2021-12-29 NOTE — Assessment & Plan Note (Signed)
Continue home medications.  Blood pressure elevated in the setting of acute allergy event plus getting epinephrine.

## 2021-12-29 NOTE — ED Notes (Signed)
Pt sleeping. 

## 2021-12-29 NOTE — ED Notes (Signed)
Pt up to bathroom with assistance.  Pt now eating applesauce and crackers.

## 2021-12-29 NOTE — ED Notes (Signed)
Fsbs 293 after eating fries from cook out from family

## 2021-12-29 NOTE — ED Notes (Signed)
Pt reports tightness and swelling to tongue, mouth, throat, and chest. EDP at bedside and aware of pt presentation. 99% saturation on room air.

## 2021-12-29 NOTE — ED Notes (Signed)
Pt waiting on admission bed.

## 2021-12-29 NOTE — ED Notes (Signed)
Meds given.  Nsr on monitor.  Pt alert.

## 2021-12-29 NOTE — Assessment & Plan Note (Signed)
Looks to be at baseline 

## 2021-12-29 NOTE — ED Triage Notes (Signed)
Pt ambulatory to desk with reports that she had an allergic reaction on Sunday, did epi pen and benadryl and it went away. Pt woke up this am and began having swelling to lips and hives to face. Pt denies SOB at the moment, feels like tongue is starting to swell.

## 2021-12-29 NOTE — ED Notes (Signed)
Meds given.  Pt alert.  No acute distress.  Family with pt.

## 2021-12-29 NOTE — H&P (Addendum)
History and Physical    Patient: Kelly Byrd XLK:[id number] DOB: [birth year] DOA: 12/29/2021 DOS: the patient was seen and examined on 12/29/2021 PCP: System, Provider Not In  Patient coming from: Home  Chief Complaint:  Chief Complaint  Patient presents with   Allergic Reaction   HPI: Patient is a 46 year old female with past medical history of stage II chronic kidney disease, hypertension, tobacco abuse, diabetes mellitus, Ace inhibitor/ARB allergy and multiple food allergies including peanuts, wheat, corn and soy who presented to the emergency room on 10/3 with complaints of facial swelling.  Patient stated that 3 days prior, she was at her sister's house eating food that is different from her usual ingredients which she is not sure what was cooking, but then she woke up on Sunday, 10/1 with facial swelling and difficult breathing and rash.  Patient used Benadryl and an EpiPen and symptoms improved so she did not seek care immediately.  However, she woke up this morning, 10/3 with recurrence of facial and lip swelling.  She took Benadryl with no improvement so she presented to the emergency room.  In the emergency room, patient has presented.  No signs of airway obstruction.  Elevated blood pressure but otherwise vital signs stable.  Patient given IV Benadryl, IV Pepcid, IV Solu-Medrol and epinephrine injection.  Patient brought in for observation.  Review of Systems: Patient complains of facial swelling, fatigue, headache, and aching all over.  She denies any shortness of breath, wheezing, cough, vision changes, chest pain, palpitations, abdominal pain, hematuria, dysuria, constipation with diarrhea, focal extremity numbness weakness or pain.  Review of systems otherwise negative. Past Medical History:  Diagnosis Date   Asthma    Back pain    Chronic kidney disease    frequent infrections   Depression    Dislocation of hip (HCC)    right hip   GERD (gastroesophageal reflux  disease)    Hypertension    Migraines    Pre-diabetes    Sickle cell trait ([place])    Past Surgical History:  Procedure Laterality Date   CESAREAN SECTION     2   CYSTOSCOPY N/A 10/23/2019   Procedure: CYSTOSCOPY;  Surgeon: Malachy Mood, MD;  Location: [facility] ORS;  Service: Gynecology;  Laterality: N/A;   TOTAL LAPAROSCOPIC HYSTERECTOMY WITH SALPINGECTOMY Bilateral 10/23/2019   Procedure: TOTAL LAPAROSCOPIC HYSTERECTOMY CONVERTED TO OPEN SUPRACERVICAL HYSTERECTOMY;  Surgeon: Malachy Mood, MD;  Location: [facility] ORS;  Service: Gynecology;  Laterality: Bilateral;   TUBAL LIGATION     upper teeth     Social History:  reports that she has been smoking cigarettes. She has been smoking an average of .25 packs per day. She has never used smokeless tobacco. She reports current alcohol use of about 1.0 standard drink of alcohol per week. She reports that she does not currently use drugs after having used the following drugs: Cocaine. Patient formally told me that she does not smoke anymore and does not do any drugs.  Allergies  Allergen Reactions   Ace Inhibitors Swelling    Throat and face   Angiotensin Receptor Blockers Anaphylaxis   Lisinopril Other (See Comments)    Swelling--caused the pt to stay in the hospital   Peanuts [Peanut Oil] Other (See Comments)    Swelling--caused the pt to stay in the hospital    Family History  Problem Relation Age of Onset   Hypertension Mother    Hypertension Father    Breast cancer Maternal Grandmother    Diabetes Mellitus II  Maternal Grandmother    Breast cancer Paternal Grandmother    Lupus Other     Prior to Admission medications   Medication Sig Start Date End Date Taking? Authorizing Provider  amLODipine (NORVASC) 10 MG tablet Take 1 tablet (10 mg total) by mouth daily. 08/13/21  Yes Charlott Rakes, MD  atorvastatin (LIPITOR) 20 MG tablet Take 1 tablet (20 mg total) by mouth daily. 08/13/21  Yes Charlott Rakes, MD  cetirizine (ZYRTEC)  10 MG tablet Take 1 tablet by mouth once daily 04/17/21  Yes Newlin, Enobong, MD  clonazePAM (KLONOPIN) 0.5 MG tablet Take 0.25 mg by mouth daily. 05/21/21  Yes [provider]  diclofenac Sodium (VOLTAREN) 1 % GEL Apply 1 application topically 4 (four) times daily as needed (pain.).  07/16/19  Yes [provider]  FARXIGA 5 MG TABS tablet TAKE 1 TABLET BY MOUTH ONCE DAILY BEFORE BREAKFAST. DISCONTINUE METFORMIN. 09/15/21  Yes Charlott Rakes, MD  fluticasone (FLONASE) 50 MCG/ACT nasal spray Use 2 spray(s) in each nostril once daily 02/02/21  Yes Newlin, Enobong, MD  mometasone-formoterol (DULERA) 200-5 MCG/ACT AERO INHALE 2 PUFFS BY MOUTH IN THE MORNING AND AT BEDTIME 08/13/21  Yes Newlin, Enobong, MD  oxyCODONE-acetaminophen (PERCOCET) 10-325 MG tablet Take 1 tablet by mouth 3 (three) times daily as needed. 10/15/21  Yes [provider]  Potassium Chloride ER 20 MEQ TBCR Take 1 tablet by mouth once daily 10/08/21  Yes Newlin, Enobong, MD  QUEtiapine (SEROQUEL) 200 MG tablet Take 200 mg by mouth at bedtime. 10/08/19  Yes [provider]  sertraline (ZOLOFT) 100 MG tablet Take 100 mg by mouth daily. 10/08/19  Yes [provider]  traZODone (DESYREL) 100 MG tablet Take 1 tablet (100 mg total) by mouth at bedtime. 10/14/17  Yes Starkes-Perry, Gayland Curry, FNP  albuterol (PROVENTIL) (2.5 MG/3ML) 0.083% nebulizer solution Take 3 mLs (2.5 mg total) by nebulization every 6 (six) hours as needed for wheezing or shortness of breath (wheezing). 01/30/21   Charlott Rakes, MD  baclofen (LIORESAL) 10 MG tablet Take 10 mg by mouth in the morning and at bedtime.  07/16/19   [provider]  Blood Glucose Monitoring Suppl (TRUE METRIX METER) DEVI 1 each by Does not apply route 3 (three) times daily. 04/15/21   Charlott Rakes, MD  EPINEPHrine 0.3 mg/0.3 mL IJ SOAJ injection Inject 0.3 mg into the muscle as needed for anaphylaxis. 07/20/21   Charlott Rakes, MD  famotidine (PEPCID)  20 MG tablet Take 1 tablet (20 mg total) by mouth 2 (two) times daily. 06/27/19 10/11/20  Patrecia Pour, MD  glucose blood (TRUE METRIX BLOOD GLUCOSE TEST) test strip 1 each by Other route 3 (three) times daily. 04/15/21   Charlott Rakes, MD  ibuprofen (ADVIL) 600 MG tablet Take 1 tablet (600 mg total) by mouth every 8 (eight) hours as needed for moderate pain. Patient not taking: Reported on 12/29/2021 07/28/21   Duffy Bruce, MD  Misc. Devices MISC Rolling walker with seat 02/02/21   Newlin, Enobong, MD  OZEMPIC, 0.25 OR 0.5 MG/DOSE, 2 MG/3ML SOPN INJECT 0.'25MG'$  INTO THE SKIN ONCE A WEEK FOR 4 WEEKS, THEN INCREASE TO 0.5 MG WEEKLY 09/15/21   Charlott Rakes, MD  topiramate (TOPAMAX) 50 MG tablet Take 50 mg by mouth daily. 08/08/21   [provider]  TRUEplus Lancets 28G MISC 1 each by Does not apply route 3 (three) times daily. 04/15/21   Charlott Rakes, MD  VENTOLIN HFA 108 (90 Base) MCG/ACT inhaler INHALE 2 PUFFS  BY MOUTH EVERY 6 HOURS AS NEEDED FOR WHEEZING OR SHORTNESS OF BREATH 09/02/20   Charlott Rakes, MD    Physical Exam: Vitals:   12/29/21 1900 12/29/21 1955 12/29/21 2024 12/29/21 2211  BP:    (!) 143/80  Pulse: 78 82  84  Resp: '19 19  18  '$ Temp:   98.5 F (36.9 C) 98 F (36.7 C)  TempSrc:   Oral Oral  SpO2: 96% 99%  98%  Weight:      Height:       General: Alert and oriented x3, mild distress, fatigued HEENT: Normocephalic, face and lips are slightly swollen although not too terribly.  Mucous membranes are slightly dry Neck: Supple, narrow airway Cardiovascular: Regular rate and rhythm, S1 S2 Lungs: Decreased breath sounds throughout secondary to body abdomen: Soft, obese, nontender, positive bowel but is( sounds Extremities: No clubbing or cyanosis or edema Neuro: No focal deficits Skin: No skin breaks, tears or lesions Psychiatry: Patient appropriate, no evidence of psychoses   Optional):[id number]} Lab work noteworthy for normal electrolytes, mild hyperglycemia.   Mildly elevated white count with no shift.  EKG notes sinus rhythm with borderline prolonged QT and left ventricular hypertrophy.  Assessment and Plan: * Anaphylactic reaction Unclear etiology which triggered this, although patient has a number of different food sensitivities and allergies and was exposed to unknown food.  Likely recurrence of symptoms 2 days later is from causative agent not yet cleared from patient's system.  Agree with monitor overnight.  Continue steroids and fluids.  Hypertension Continue home medications.  Blood pressure elevated in the setting of acute allergy event plus getting epinephrine.  Uncontrolled type 2 diabetes mellitus with hyperglycemia, without long-term current use of insulin (HCC) Overall relatively well controlled although patient states that she has had higher blood sugars with the [place].  Expect high blood sugars now that she is on IV steroids.  Cover with sliding scale.  Major depressive disorder, recurrent severe without psychotic features ([place]) Continue Zoloft and Seroquel  CKD (chronic kidney disease) stage 2, GFR 60-89 ml/min Looks to be at baseline  Hyperlipidemia Continue statin  Obesity, Class III, BMI 40-49.9 (morbid obesity) ([place]) Meets criteria with BMI greater than 40      Advance Care Planning:   Code Status: Full Code confirmed full code  Consults: None  Family Communication: Declined for me to call sister  Severity of Illness: The appropriate patient status for this patient is OBSERVATION. Observation status is judged to be reasonable and necessary in order to provide the required intensity of service to ensure the patient's safety. The patient's presenting symptoms, physical exam findings, and initial radiographic and laboratory data in the context of their medical condition is felt to place them at decreased risk for further clinical deterioration. Furthermore, it is anticipated that the patient will be medically stable  for discharge from the hospital within 2 midnights of admission.   Author: Annita Brod, MD 12/29/2021 10:37 PM  For on call review [email or web address].

## 2021-12-29 NOTE — ED Provider Notes (Addendum)
[facility] Provider Note    Event Date/Time   First MD Initiated Contact with Patient 12/29/21 1608     (approximate)   History   Chief Complaint: Allergic Reaction   HPI  Kelly Byrd is a 46 y.o. female with a history of hypertension, CKD, diabetes, multiple food allergies who comes the ED complaining of swelling of the lips and face along with a rash of hives.  Noticed this morning on waking up at about 7:00 AM.  She took 75 mg of Benadryl at that time, but throughout the day she is not feeling any better.  Denies shortness of breath.  She reports that her symptoms initially started 2 days ago.  She stayed at her sister's house overnight 3 days ago, ate food that is different from her usual ingredients, she is not sure what might of been in the cooking, and woke up Sunday with facial swelling and difficulty breathing and a rash.  She used an EpiPen and took 50 mg of Benadryl at that time, and symptoms did improve so she did not come to the hospital.  However, symptoms of reoccurred today.  I reviewed outside records noting elevated IgE levels to multiple food allergens including peanut, wheat, corn, soy.  She has had tests for HAE which were negative.     Physical Exam   Triage Vital Signs: ED Triage Vitals  Enc Vitals Group     BP 12/29/21 1603 (!) 159/101     Pulse Rate 12/29/21 1603 85     Resp 12/29/21 1603 17     Temp 12/29/21 1603 98.2 F (36.8 C)     Temp Source 12/29/21 1603 Oral     SpO2 12/29/21 1603 99 %     Weight 12/29/21 1610 275 lb (124.7 kg)     Height 12/29/21 1610 '5\' 4"'$  (1.626 m)     Head Circumference --      Peak Flow --      Pain Score 12/29/21 1610 0     Pain Loc --      Pain Edu? --      Excl. in [place]? --     Most recent vital signs: Vitals:   12/29/21 1603 12/29/21 1620  BP: (!) 159/101 (!) 142/78  Pulse: 85 76  Resp: 17 18  Temp: 98.2 F (36.8 C)   SpO2: 99% 97%    General: Awake, no distress.   CV:  Good peripheral perfusion.  Regular rate and rhythm Resp:  Normal effort.  Clear to auscultation bilaterally Abd:  No distention.  Soft nontender Other:  Urticarial rash present on the face neck and upper arms.  There is edema of the lower lip, floor of mouth, and submental space.   ED Results / Procedures / Treatments   Labs (all labs ordered are listed, but only abnormal results are displayed) Labs Reviewed  BASIC METABOLIC PANEL - Abnormal; Notable for the following components:      Result Value   Glucose, Bld 113 (*)    All other components within normal limits  CBC WITH DIFFERENTIAL/PLATELET - Abnormal; Notable for the following components:   WBC 12.0 (*)    RBC 5.89 (*)    Hemoglobin 15.2 (*)    HCT 48.1 (*)    MCH 25.8 (*)    RDW 17.1 (*)    All other components within normal limits  RESP PANEL BY RT-PCR (FLU A&B, COVID) ARPGX2  PROTIME-INR     EKG  Interpreted by me Sinus rhythm rate of 83.  Normal axis and intervals.  Poor R wave progression.  Normal ST segments and T waves.   RADIOLOGY Chest x-ray interpreted by me, appears normal.  Radiology report reviewed  X-ray soft tissue neck interpreted by me, no evidence of airway constriction.  Radiology report reviewed.   PROCEDURES:  .1-3 Lead EKG Interpretation  Performed by: Carrie Mew, MD Authorized by: Carrie Mew, MD     Interpretation: normal     ECG rate:  70   ECG rate assessment: normal     Rhythm: sinus rhythm     Ectopy: none     Conduction: normal   Comments:        .Critical Care  Performed by: Carrie Mew, MD Authorized by: Carrie Mew, MD   Critical care provider statement:    Critical care time (minutes):  33   Critical care time was exclusive of:  Separately billable procedures and treating other patients   Critical care was necessary to treat or prevent imminent or life-threatening deterioration of the following conditions:  Respiratory failure    Critical care was time spent personally by me on the following activities:  Development of treatment plan with patient or surrogate, discussions with consultants, evaluation of patient's response to treatment, examination of patient, obtaining history from patient or surrogate, ordering and performing treatments and interventions, ordering and review of laboratory studies, ordering and review of radiographic studies, pulse oximetry, re-evaluation of patient's condition and review of old charts   Care discussed with: admitting provider      MEDICATIONS ORDERED IN ED: Medications  sodium chloride 0.9 % bolus 1,000 mL (1,000 mLs Intravenous New Bag/Given 12/29/21 1622)  diphenhydrAMINE (BENADRYL) injection 50 mg (50 mg Intravenous Given 12/29/21 1623)  famotidine (PEPCID) IVPB 20 mg premix (0 mg Intravenous Stopped 12/29/21 1700)  methylPREDNISolone sodium succinate (SOLU-MEDROL) 125 mg/2 mL injection 125 mg (125 mg Intravenous Given 12/29/21 1623)  EPINEPHrine (EPI-PEN) injection 0.3 mg (0.3 mg Intramuscular Given 12/29/21 1623)     IMPRESSION / MDM / [place] / ED COURSE  I reviewed the triage vital signs and the nursing notes.                               Patient presents with clinically apparent anaphylactic reaction.  Doubt viral illness or croup or soft tissue infection.  Doubt [place]'s angina, peritonsillar abscess, RPA.  Patient given EpiPen, Solu-Medrol, Benadryl, Pepcid with improvement of symptoms.  Due to her recurrence, morbid obesity, known multiple food allergens, I discussed her care with the hospitalist team for further evaluation to ensure resolution of symptoms.       FINAL CLINICAL IMPRESSION(S) / ED DIAGNOSES   Final diagnoses:  Anaphylaxis, initial encounter  Morbid obesity ([place])     Rx / DC Orders   ED Discharge Orders     None        Note:  This document was prepared using Dragon voice recognition software and may include unintentional  dictation errors.   Carrie Mew, MD 12/29/21 1717    Carrie Mew, MD 12/29/21 [phone]

## 2021-12-29 NOTE — ED Notes (Signed)
Family with pt.  Swelling improved in face and lip.   No itching.  No rash noted.  Pt alert.

## 2021-12-29 NOTE — ED Notes (Signed)
Pt resting quietly.  Swelling slightly improved.  Pt states she feels better.  Nsr on monitor.

## 2021-12-29 NOTE — Assessment & Plan Note (Signed)
Continue statin. 

## 2021-12-29 NOTE — ED Notes (Signed)
Pt with swelling to lower lip and face and neck.  Pt having diff talking and swallowing.  Sx began 2 days and pt took epi pen.   Today sx worse.  Md at bedside.  Iv started stat.  Ekg done.  Pt reports she is unsure what she is having a reaction to.  No itching.  Pt took 3 benadryl prior to arrival  redness noted to body.   Nsr on monitor

## 2021-12-29 NOTE — Progress Notes (Signed)
PHARMACIST - PHYSICIAN COMMUNICATION  CONCERNING:  Enoxaparin (Lovenox) for DVT Prophylaxis    RECOMMENDATION: Patient was prescribed enoxaprin '40mg'$  q24 hours for VTE prophylaxis.   Filed Weights   12/29/21 1610  Weight: 124.7 kg (275 lb)    Body mass index is 47.2 kg/m.  Estimated Creatinine Clearance: 117.2 mL/min (by C-G formula based on SCr of 0.55 mg/dL).   Based on [place] patient is candidate for enoxaparin 0.'5mg'$ /kg TBW SQ every 24 hours based on BMI being >30.  DESCRIPTION: Pharmacy has adjusted enoxaparin dose per [facility] policy.  Patient is now receiving enoxaparin 0.5 mg/kg every 24 hours   Renda Rolls, PharmD, [facility] 12/29/2021 9:52 PM

## 2021-12-29 NOTE — Hospital Course (Signed)
Patient is a 46 year old female with past medical history of stage II chronic kidney disease, hypertension, tobacco abuse, diabetes mellitus, Ace inhibitor/ARB allergy and multiple food allergies including peanuts, wheat, corn and soy who presented to the emergency room on 10/3 with complaints of facial swelling.  Patient stated that 3 days prior, she was at her sister's house eating food that is different from her usual ingredients which she is not sure what was cooking, but then she woke up on Sunday, 10/1 with facial swelling and difficult breathing and rash.  Patient used Benadryl and an EpiPen and symptoms improved so she did not seek care immediately.  However, she woke up this morning, 10/3 with recurrence of facial and lip swelling.  She took Benadryl with no improvement so she presented to the emergency room.  In the emergency room, patient has presented.  No signs of airway obstruction.  Elevated blood pressure but otherwise vital signs stable.  Patient given IV Benadryl, IV Pepcid, IV Solu-Medrol and epinephrine injection.  Patient brought in for observation.

## 2021-12-29 NOTE — Assessment & Plan Note (Signed)
Meets criteria with BMI greater than 40 

## 2021-12-29 NOTE — Assessment & Plan Note (Signed)
Overall relatively well controlled although patient states that she has had higher blood sugars with the [place].  Expect high blood sugars now that she is on IV steroids.  Cover with sliding scale.

## 2021-12-29 NOTE — Assessment & Plan Note (Signed)
Continue Zoloft and Seroquel

## 2021-12-30 NOTE — ED Notes (Signed)
Pt moved to 18 hallway due to emergency traffic

## 2021-12-30 NOTE — ED Notes (Signed)
Pt sleeping.  Family with pt

## 2021-12-30 NOTE — ED Notes (Signed)
Patient moved to hallway bed due to a critical situation in the department.  Patient and visitor upset.  Pt reports she wants to leave AMA.  States "I didn't come here and wait all day to be put out in the hallway like this."  Attempted to explain to patient reasoning and provide additional comfort measures.  Pt remains upset and wants to leave.  [place], PA made aware.  Verbal understanding voiced concerning AMA and returning if symptoms worsen.  Pt currently alert and oriented.  Steady gait noted.  Visitor at bedside and will be transporting patient home.  Pt attempted to sign AMA form and pad was not connecting to computer.  Attempted to find another pad and patient unwilling to wait.  Patient verbally acknowledged AMA and return precautions.

## 2022-02-13 ENCOUNTER — Other Ambulatory Visit: Payer: Self-pay | Admitting: Family Medicine

## 2022-02-13 DIAGNOSIS — E1169 Type 2 diabetes mellitus with other specified complication: Secondary | ICD-10-CM

## 2022-02-15 NOTE — Telephone Encounter (Signed)
Requested medications are due for refill today.  yes  Requested medications are on the active medications list.  yes  Last refill. 09/15/2021 40m 0 rf  Future visit scheduled.   no  Notes to clinic.  No PCP listed. Sig will need update.    Requested Prescriptions  Pending Prescriptions Disp Refills   OZEMPIC, 0.25 OR 0.5 MG/DOSE, 2 MG/3ML SOPN [Pharmacy Med Name: Ozempic (0.25 or 0.5 MG/DOSE) 2 MG/3ML Subcutaneous Solution Pen-injector] 3 mL 0    Sig: INJECT 0.'25MG'$  INTO THE SKIN ONCE A WEEK FOR 4 WEEKS, THEN INCREASE TO 0.5 MG WEEKLY     Endocrinology:  Diabetes - GLP-1 Receptor Agonists - semaglutide Failed - 02/13/2022 12:06 PM      Failed - HBA1C in normal range and within 180 days    HbA1c, POC (controlled diabetic range)  Date Value Ref Range Status  04/15/2021 7.1 (A) 0.0 - 7.0 % Final   Hgb A1c MFr Bld  Date Value Ref Range Status  07/14/2021 6.8 (H) 4.8 - 5.6 % Final    Comment:    (NOTE) Pre diabetes:          5.7%-6.4%  Diabetes:              >6.4%  Glycemic control for   <7.0% adults with diabetes          Failed - Valid encounter within last 6 months    Recent Outpatient Visits           6 months ago Type 2 diabetes mellitus with other specified complication, without long-term current use of insulin ([place]   [place]   10 months ago Type 2 diabetes mellitus with other specified complication, without long-term current use of insulin ([place]   [place] Ferretti [place]   1 year ago Otalgia of both ears   [facility] [place] [facility] [place]   1 year ago Annual physical exam   [place], [place]   1 year ago Vaginal itching   [place]              Passed - Cr in normal range and within 360 days    Creatinine, Ser  Date Value Ref Range Status   12/29/2021 0.88 0.44 - 1.00 mg/dL Final

## 2022-02-23 ENCOUNTER — Telehealth: Payer: Self-pay | Admitting: Family Medicine

## 2022-02-23 NOTE — Telephone Encounter (Signed)
Last visit in 07/2021. Pt has no-shown once in July and in August subsequently. She will need to at least schedule a visit to receive refills. Once she schedules, I am permitted to send refills. There are also old medications on her profile, dating back to 2019. She needs to request specific medications.

## 2022-02-23 NOTE — Telephone Encounter (Signed)
Pt declined scheduling an appt when offered, says she does not want to wait until February to see her PCP. Pt insists on receiving medication without an appt. Please advise    Requesting a refill on all meds   [facility] ([facility] [place] [address]  [place] [address]  Phone: [phone] Fax: [phone]

## 2022-03-02 ENCOUNTER — Other Ambulatory Visit: Payer: Self-pay | Admitting: Family Medicine

## 2022-03-02 DIAGNOSIS — E1169 Type 2 diabetes mellitus with other specified complication: Secondary | ICD-10-CM

## 2022-03-02 NOTE — Telephone Encounter (Signed)
Requested medications are due for refill today.  Unsure  Requested medications are on the active medications list.  Allopurinol is not, the other 2 are  Last refill. Varies.  Future visit scheduled.   No   Notes to clinic.  No PCP listed.    Requested Prescriptions  Pending Prescriptions Disp Refills   OZEMPIC, 0.25 OR 0.5 MG/DOSE, 2 MG/3ML SOPN [Pharmacy Med Name: Ozempic (0.25 or 0.5 MG/DOSE) 2 MG/3ML Subcutaneous Solution Pen-injector] 3 mL 0    Sig: INJECT 0.'25MG'$  INTO THE SKIN ONCE A WEEK FOR 4 WEEKS, THEN INCREASE TO 0.5 MG WEEKLY     Endocrinology:  Diabetes - GLP-1 Receptor Agonists - semaglutide Failed - 03/02/2022  1:24 PM      Failed - HBA1C in normal range and within 180 days    HbA1c, POC (controlled diabetic range)  Date Value Ref Range Status  04/15/2021 7.1 (A) 0.0 - 7.0 % Final   Hgb A1c MFr Bld  Date Value Ref Range Status  07/14/2021 6.8 (H) 4.8 - 5.6 % Final    Comment:    (NOTE) Pre diabetes:          5.7%-6.4%  Diabetes:              >6.4%  Glycemic control for   <7.0% adults with diabetes          Failed - Valid encounter within last 6 months    Recent Outpatient Visits           6 months ago Type 2 diabetes mellitus with other specified complication, without long-term current use of insulin ([place])   [place], Charlane Ferretti, MD   10 months ago Type 2 diabetes mellitus with other specified complication, without long-term current use of insulin ([place])   [place], [place], MD   1 year ago Otalgia of both ears   [facility] [place], [place], MD   1 year ago Annual physical exam   [place], Charlane Ferretti, MD   1 year ago Vaginal itching   [place] Ladell Pier, MD              Passed - Cr in normal range and within 360 days    Creatinine, Ser  Date Value Ref Range Status   12/29/2021 0.88 0.44 - 1.00 mg/dL Final          allopurinol (ZYLOPRIM) 300 MG tablet [Pharmacy Med Name: Allopurinol 300 MG Oral Tablet] 90 tablet 0    Sig: Take 1 tablet by mouth once daily     Endocrinology:  Gout Agents - allopurinol Failed - 03/02/2022  1:24 PM      Failed - Uric Acid in normal range and within 360 days    Uric Acid  Date Value Ref Range Status  04/16/2021 6.4 (H) 2.6 - 6.2 mg/dL Final    Comment:               Therapeutic target for gout patients: <6.0         Passed - Cr in normal range and within 360 days    Creatinine, Ser  Date Value Ref Range Status  12/29/2021 0.88 0.44 - 1.00 mg/dL Final         Passed - Valid encounter within last 12 months    Recent Outpatient Visits  6 months ago Type 2 diabetes mellitus with other specified complication, without long-term current use of insulin ([place])   [place], [place], MD   10 months ago Type 2 diabetes mellitus with other specified complication, without long-term current use of insulin ([place])   [facility] [place], [place], MD   1 year ago Otalgia of both ears   [facility] [place], Charlane Ferretti, MD   1 year ago Annual physical exam   [place], Charlane Ferretti, MD   1 year ago Vaginal itching   [place] Ladell Pier, MD              Passed - CBC within normal limits and completed in the last 12 months    WBC  Date Value Ref Range Status  12/29/2021 12.0 (H) 4.0 - 10.5 K/uL Final   RBC  Date Value Ref Range Status  12/29/2021 5.89 (H) 3.87 - 5.11 MIL/uL Final   Hemoglobin  Date Value Ref Range Status  12/29/2021 15.2 (H) 12.0 - 15.0 g/dL Final   HCT  Date Value Ref Range Status  12/29/2021 48.1 (H) 36.0 - 46.0 % Final   MCHC  Date Value Ref Range Status  12/29/2021 31.6 30.0 - 36.0 g/dL Final   [facility]  Date Value Ref  Range Status  12/29/2021 25.8 (L) 26.0 - 34.0 pg Final   MCV  Date Value Ref Range Status  12/29/2021 81.7 80.0 - 100.0 fL Final   No results found for: "PLTCOUNTKUC", "LABPLAT", "POCPLA" RDW  Date Value Ref Range Status  12/29/2021 17.1 (H) 11.5 - 15.5 % Final          Potassium Chloride ER 20 MEQ TBCR [Pharmacy Med Name: Potassium Chloride ER 20 MEQ Oral Tablet Extended Release] 90 tablet 0    Sig: Take 1 tablet by mouth once daily     Endocrinology:  Minerals - Potassium Supplementation Passed - 03/02/2022  1:24 PM      Passed - K in normal range and within 360 days    Potassium  Date Value Ref Range Status  12/29/2021 3.8 3.5 - 5.1 mmol/L Final         Passed - Cr in normal range and within 360 days    Creatinine, Ser  Date Value Ref Range Status  12/29/2021 0.88 0.44 - 1.00 mg/dL Final         Passed - Valid encounter within last 12 months    Recent Outpatient Visits           6 months ago Type 2 diabetes mellitus with other specified complication, without long-term current use of insulin ([place])   [place], [place], MD   10 months ago Type 2 diabetes mellitus with other specified complication, without long-term current use of insulin ([place])   [place], Enobong, MD   1 year ago Otalgia of both ears   [facility] Charlott Rakes, MD   1 year ago Annual physical exam   [place], Enobong, MD   1 year ago Vaginal itching   [place] Ladell Pier, MD

## 2022-03-31 ENCOUNTER — Other Ambulatory Visit: Payer: Self-pay | Admitting: Family Medicine

## 2022-03-31 ENCOUNTER — Ambulatory Visit: Payer: Self-pay

## 2022-03-31 ENCOUNTER — Telehealth: Payer: Medicaid Other | Admitting: Nurse Practitioner

## 2022-03-31 ENCOUNTER — Other Ambulatory Visit: Payer: Self-pay | Admitting: Pharmacist

## 2022-03-31 DIAGNOSIS — M1A9XX0 Chronic gout, unspecified, without tophus (tophi): Secondary | ICD-10-CM

## 2022-03-31 DIAGNOSIS — E1169 Type 2 diabetes mellitus with other specified complication: Secondary | ICD-10-CM

## 2022-03-31 DIAGNOSIS — K219 Gastro-esophageal reflux disease without esophagitis: Secondary | ICD-10-CM | POA: Diagnosis not present

## 2022-03-31 DIAGNOSIS — N3000 Acute cystitis without hematuria: Secondary | ICD-10-CM | POA: Diagnosis not present

## 2022-03-31 MED ORDER — CEPHALEXIN 500 MG PO CAPS: 500.0000 mg | ORAL_CAPSULE | Freq: Two times a day (BID) | ORAL | 0 refills | Status: AC

## 2022-03-31 MED ORDER — ALLOPURINOL 100 MG PO TABS: ORAL_TABLET | ORAL | 6 refills | Status: DC

## 2022-03-31 MED ORDER — FAMOTIDINE 20 MG PO TABS: 20.0000 mg | ORAL_TABLET | Freq: Two times a day (BID) | ORAL | 0 refills | Status: AC

## 2022-03-31 MED ORDER — OZEMPIC (0.25 OR 0.5 MG/DOSE) 2 MG/3ML ~~LOC~~ SOPN: PEN_INJECTOR | SUBCUTANEOUS | 1 refills | Status: DC

## 2022-03-31 NOTE — Progress Notes (Signed)
Virtual Visit Consent   Kelly Byrd, you are scheduled for a virtual visit with a [facility] provider today. Just as with appointments in the office, your consent must be obtained to participate. Your consent will be active for this visit and any virtual visit you may have with one of our providers in the next 365 days. If you have a MyChart account, a copy of this consent can be sent to you electronically.  As this is a virtual visit, video technology does not allow for your provider to perform a traditional examination. This may limit your provider's ability to fully assess your condition. If your provider identifies any concerns that need to be evaluated in person or the need to arrange testing (such as labs, EKG, etc.), we will make arrangements to do so. Although advances in technology are sophisticated, we cannot ensure that it will always work on either your end or our end. If the connection with a video visit is poor, the visit may have to be switched to a telephone visit. With either a video or telephone visit, we are not always able to ensure that we have a secure connection.  By engaging in this virtual visit, you consent to the provision of healthcare and authorize for your insurance to be billed (if applicable) for the services provided during this visit. Depending on your insurance coverage, you may receive a charge related to this service.  I need to obtain your verbal consent now. Are you willing to proceed with your visit today? Kelly Byrd has provided verbal consent on 03/31/2022 for a virtual visit (video or telephone). Apolonio Schneiders, FNP  Date: 03/31/2022 4:27 PM  Virtual Visit via Video Note   I, Apolonio Schneiders, connected with  Kelly Byrd  ([id number], 47) on 03/31/22 at  4:30 PM EST by a video-enabled telemedicine application and verified that I am speaking with the correct person using two identifiers.  Location: Patient: Virtual Visit  Location Patient: Home Provider: Virtual Visit Location Provider: Home Office   I discussed the limitations of evaluation and management by telemedicine and the availability of in person appointments. The patient expressed understanding and agreed to proceed.    History of Present Illness: Kelly Byrd is a 47 y.o. who identifies as a female who was assigned female at birth, and is being seen today for symptoms consistent with a UTI, pressure and pain with urination. Noted darker urine no vaginal discharge  Denies fever Most recent UTI was 06/2021 without sensitivity provided   Resistant to Macrobid & tetracycline with most recent culture    She has a history of gout and was on allopurinol in the past She has run out and is requesting a refill  Her next office appointment with PCP is in February 24  She has also used Pepcid Rx in the past and is requesting a refill for this as well   She missed her physical last year due to lack of transportation and is unable to receive refills from her PCP office until she is re established with physical scheduled for February of this year   Problems:  Patient Active Problem List   Diagnosis Date Noted   Anaphylactic reaction 12/29/2021   Obesity, Class III, BMI 40-49.9 (morbid obesity) ([place]) 12/29/2021   Uncontrolled type 2 diabetes mellitus with hyperglycemia, without long-term current use of insulin ([place]) 12/29/2021   CKD (chronic kidney disease) stage 2, GFR 60-89 ml/min    Rhabdomyolysis 10/16/2020   Syncope  and collapse 10/16/2020   S/P abdominal supracervical subtotal hysterectomy 10/23/2019   Abdominal adhesions    Abnormal uterine bleeding (AUB) 10/11/2019   Intramural leiomyoma of uterus 10/11/2019   Angioedema 06/26/2019   Facial swelling    Allergic reaction 06/25/2019   Hypokalemia 06/25/2019   Abdominal pain 06/25/2019   Major depressive disorder, recurrent severe without psychotic features ([place]) 10/11/2017   Cocaine  abuse ([place]) 10/11/2017   Lumbar radiculopathy 05/31/2017   Hyperlipidemia 05/31/2017   GERD (gastroesophageal reflux disease) 08/05/2016   Fibroids 09/19/2015   Hypertension 08/29/2015   Asthma 08/29/2015   Back pain 08/29/2015   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   Anemia, iron deficiency 10/29/2014   Menometrorrhagia 10/29/2014   Migraine 10/27/2014    Allergies:  Allergies  Allergen Reactions   Ace Inhibitors Swelling    Throat and face   Angiotensin Receptor Blockers Anaphylaxis   Lisinopril Other (See Comments)    Swelling--caused the pt to stay in the hospital   Peanuts [Peanut Oil] Other (See Comments)    Swelling--caused the pt to stay in the hospital   Medications:  Current Outpatient Medications:    albuterol (PROVENTIL) (2.5 MG/3ML) 0.083% nebulizer solution, Take 3 mLs (2.5 mg total) by nebulization every 6 (six) hours as needed for wheezing or shortness of breath (wheezing)., Disp: 75 mL, Rfl: 2   amLODipine (NORVASC) 10 MG tablet, Take 1 tablet (10 mg total) by mouth daily., Disp: 90 tablet, Rfl: 1   atorvastatin (LIPITOR) 20 MG tablet, Take 1 tablet (20 mg total) by mouth daily., Disp: 90 tablet, Rfl: 1   baclofen (LIORESAL) 10 MG tablet, Take 10 mg by mouth in the morning and at bedtime. , Disp: , Rfl:    Blood Glucose Monitoring Suppl (TRUE METRIX METER) DEVI, 1 each by Does not apply route 3 (three) times daily., Disp: 1 each, Rfl: 0   cetirizine (ZYRTEC) 10 MG tablet, Take 1 tablet by mouth once daily, Disp: 90 tablet, Rfl: 0   clonazePAM (KLONOPIN) 0.5 MG tablet, Take 0.25 mg by mouth daily., Disp: , Rfl:    diclofenac Sodium (VOLTAREN) 1 % GEL, Apply 1 application topically 4 (four) times daily as needed (pain.). , Disp: , Rfl:    EPINEPHrine 0.3 mg/0.3 mL IJ SOAJ injection, Inject 0.3 mg into the muscle as needed for anaphylaxis., Disp: 2 each, Rfl: 2   famotidine (PEPCID) 20 MG tablet, Take 1 tablet (20 mg total) by mouth 2 (two) times daily., Disp:  60 tablet, Rfl: 0   FARXIGA 5 MG TABS tablet, TAKE 1 TABLET BY MOUTH ONCE DAILY BEFORE BREAKFAST. DISCONTINUE METFORMIN., Disp: 90 tablet, Rfl: 0   fluticasone (FLONASE) 50 MCG/ACT nasal spray, Use 2 spray(s) in each nostril once daily, Disp: 16 g, Rfl: 3   glucose blood (TRUE METRIX BLOOD GLUCOSE TEST) test strip, 1 each by Other route 3 (three) times daily., Disp: 100 each, Rfl: 12   ibuprofen (ADVIL) 600 MG tablet, Take 1 tablet (600 mg total) by mouth every 8 (eight) hours as needed for moderate pain. (Patient not taking: Reported on 12/29/2021), Disp: 20 tablet, Rfl: 0   Misc. Devices MISC, Rolling walker with seat, Disp: 1 each, Rfl: 0   mometasone-formoterol (DULERA) 200-5 MCG/ACT AERO, INHALE 2 PUFFS BY MOUTH IN THE MORNING AND AT BEDTIME, Disp: 13 g, Rfl: 6   oxyCODONE-acetaminophen (PERCOCET) 10-325 MG tablet, Take 1 tablet by mouth 3 (three) times daily as needed., Disp: , Rfl:    Potassium Chloride ER 20  MEQ TBCR, Take 1 tablet by mouth once daily, Disp: 90 tablet, Rfl: 0   QUEtiapine (SEROQUEL) 200 MG tablet, Take 200 mg by mouth at bedtime., Disp: , Rfl:    Semaglutide,0.25 or 0.'5MG'$ /DOS, (OZEMPIC, 0.25 OR 0.5 MG/DOSE,) 2 MG/3ML SOPN, INJECT 0.'5MG'$  INTO THE SKIN ONCE A WEEK., Disp: 3 mL, Rfl: 1   sertraline (ZOLOFT) 100 MG tablet, Take 100 mg by mouth daily., Disp: , Rfl:    topiramate (TOPAMAX) 50 MG tablet, Take 50 mg by mouth daily., Disp: , Rfl:    traZODone (DESYREL) 100 MG tablet, Take 1 tablet (100 mg total) by mouth at bedtime., Disp: 30 tablet, Rfl: 0   TRUEplus Lancets 28G MISC, 1 each by Does not apply route 3 (three) times daily., Disp: 100 each, Rfl: 11   VENTOLIN HFA 108 (90 Base) MCG/ACT inhaler, INHALE 2 PUFFS BY MOUTH EVERY 6 HOURS AS NEEDED FOR WHEEZING OR SHORTNESS OF BREATH, Disp: 18 g, Rfl: 0  Observations/Objective: Patient is well-developed, well-nourished in no acute distress.  Resting comfortably  at home.  Head is normocephalic, atraumatic.  No labored  breathing.  Speech is clear and coherent with logical content.  Patient is alert and oriented at baseline.    Assessment and Plan: 1. Acute cystitis without hematuria  - cephALEXin (KEFLEX) 500 MG capsule; Take 1 capsule (500 mg total) by mouth 2 (two) times daily for 7 days.  Dispense: 14 capsule; Refill: 0  2. Chronic gout involving toe without tophus, unspecified cause, unspecified laterality  - allopurinol (ZYLOPRIM) 100 MG tablet; Start with 100 mg daily for one week then increase to 200 mg (two tablets daily for one week) and then increase to 300 mg( 3 tablets) daily and continue this dose daily as tolerated  Dispense: 60 tablet; Refill: 6  3. Gastroesophageal reflux disease without esophagitis  - famotidine (PEPCID) 20 MG tablet; Take 1 tablet (20 mg total) by mouth 2 (two) times daily.  Dispense: 60 tablet; Refill: 0     Follow Up Instructions: I discussed the assessment and treatment plan with the patient. The patient was provided an opportunity to ask questions and all were answered. The patient agreed with the plan and demonstrated an understanding of the instructions.  A copy of instructions were sent to the patient via MyChart unless otherwise noted below.    The patient was advised to call back or seek an in-person evaluation if the symptoms worsen or if the condition fails to improve as anticipated.  Time:  I spent 20 minutes with the patient via telehealth technology discussing the above problems/concerns.    Apolonio Schneiders, FNP

## 2022-03-31 NOTE — Telephone Encounter (Signed)
Requested medication (s) are due for refill today: Yes  Requested medication (s) are on the active medication list: Yes  Last refill:  09/15/21  Future visit scheduled: Yes  Notes to clinic:  Unable to refill per protocol, medication has a tapering dose for instructions, will need a new Rx with the current dose being given 0.5 mg weekly.     Requested Prescriptions  Pending Prescriptions Disp Refills   Semaglutide,0.25 or 0.'5MG'$ /DOS, (OZEMPIC, 0.25 OR 0.5 MG/DOSE,) 2 MG/3ML SOPN 3 mL 0     Endocrinology:  Diabetes - GLP-1 Receptor Agonists - semaglutide Failed - 03/31/2022 12:44 PM      Failed - HBA1C in normal range and within 180 days    HbA1c, POC (controlled diabetic range)  Date Value Ref Range Status  04/15/2021 7.1 (A) 0.0 - 7.0 % Final   Hgb A1c MFr Bld  Date Value Ref Range Status  07/14/2021 6.8 (H) 4.8 - 5.6 % Final    Comment:    (NOTE) Pre diabetes:          5.7%-6.4%  Diabetes:              >6.4%  Glycemic control for   <7.0% adults with diabetes          Failed - Valid encounter within last 6 months    Recent Outpatient Visits           7 months ago Type 2 diabetes mellitus with other specified complication, without long-term current use of insulin ([place])   [place], [place], MD   11 months ago Type 2 diabetes mellitus with other specified complication, without long-term current use of insulin ([place])   [place], Charlane Ferretti, MD   1 year ago Otalgia of both ears   [facility] Charlott Rakes, MD   1 year ago Annual physical exam   [place], [place]obong, MD   1 year ago Vaginal itching   [place], MD       Future Appointments             In 1 month Charlott Rakes, MD [place] - Cr in normal range and  within 360 days    Creatinine, Ser  Date Value Ref Range Status  12/29/2021 0.88 0.44 - 1.00 mg/dL Final

## 2022-03-31 NOTE — Telephone Encounter (Signed)
Patient called, left VM to return the call to the office concerning gout medication. Will create NT encounter.

## 2022-03-31 NOTE — Telephone Encounter (Signed)
  Chief Complaint: medication refill and possible UTI Symptoms: leg and feet pain and swelling d/t not having allopurinol, pressure with urination and flank R pain  Frequency: 1 week  Pertinent Negatives: NA Disposition: '[]'$ ED /'[]'$ Urgent Care (no appt availability in office) / '[]'$ Appointment(In office/virtual)/ '[x]'$  [facility]/ '[]'$ Home Care/ '[]'$ Refused Recommended Disposition /'[]'$ [facility]/ '[]'$  Follow-up with PCP Additional Notes: advised pt that Ozempic was sent to pharmacy by Lurena Joiner, [facility]. pt states she got # for orthopedic surgery for hip pain. Also states she needing refill on gout medication allopurinol '300mg'$  that is causing leg and foot pain. Pt also thinks she may have UTI for 1 week d/t having pressure when urinating and R flank pain. Advised pt no appts available until 04/29/22 so scheduled virtual UC visit today at 1630. Pt's daughter was able to assist with accessing Mychart account.   Summary: Hip/side pain/High blood sugar   The patient called in about multiple things. She mostly wanted the number to the [facility] for pain management she received last year but never made it due to being scared about scheduling surgery. She also mentioned she has pain for gout and was checking on a medication for that. She stated she has also been having pain in her hips and some side pain. She states she might have a UTI but not sure. The was also the mentioning of a blood sugar over 200 and why she needed a refill on her Ozempic. Please assist patient further         Reason for Disposition  Side (flank) or lower back pain present  Answer Assessment - Initial Assessment Questions 1. ONSET: "When did the pain start?"      Few weeks  2. LOCATION: "Where is the pain located?"      Feet and legs  3. PAIN: "How bad is the pain?"    (Scale 1-10; or mild, moderate, severe)  - MILD (1-3): doesn't interfere with normal activities.   - MODERATE (4-7): interferes with normal activities (e.g.,  work or school) or awakens from sleep, limping.   - SEVERE (8-10): excruciating pain, unable to do any normal activities, unable to walk.      Mild to moderate 5. CAUSE: "What do you think is causing the foot pain?"     Gout and elevated BS 6. OTHER SYMPTOMS: "Do you have any other symptoms?" (e.g., leg pain, rash, fever, numbness)     Leg swelling  Answer Assessment - Initial Assessment Questions 1. SYMPTOM: "What's the main symptom you're concerned about?" (e.g., frequency, incontinence)     Pressure and flank R pain  2. ONSET: "When did the  sx  start?"     1 week  3. PAIN: "Is there any pain?" If Yes, ask: "How bad is it?" (Scale: 1-10; mild, moderate, severe)     Mild to moderate but getting worse  4. CAUSE: "What do you think is causing the symptoms?"     Possible UTI  5. OTHER SYMPTOMS: "Do you have any other symptoms?" (e.g., blood in urine, fever, flank pain, pain with urination)     Flank pain and pain with urination.  Protocols used: Foot Pain-A-AH, Urinary Symptoms-A-AH

## 2022-03-31 NOTE — Telephone Encounter (Signed)
Medication Refill - Medication: OZEMPIC, 0.25 OR 0.5 MG/DOSE, 2 MG/3ML SOPN ,   Has the patient contacted their pharmacy? Yes.     Preferred Pharmacy (with phone number or street name):  [facility] [place] - [address] Phone: [phone]  Fax: [phone]     Has the patient been seen for an appointment in the last year OR does the patient have an upcoming appointment? Yes.    Please assist patient further. She is also inquiring about a refill on a gout medication that is not listed among her current medications on file. She says is starts with a Z

## 2022-03-31 NOTE — Telephone Encounter (Signed)
Please assist patient further. She is also inquiring about a refill on a gout medication that is not listed among her current medications on file. She says is starts with a Z   Allopurinol was removed from her medication list on 12/29/21 at the ED visit. Patient is requesting a refill.

## 2022-03-31 NOTE — Patient Instructions (Signed)
Start Allopurinol at one tablet daily for one week ('100mg'$ )  Increase to two tablets daily ('200mg'$ ) for second week Increase to three tablets daily ('300mg'$ ) and continue this dose until follow up with PCP in February

## 2022-04-01 NOTE — Telephone Encounter (Signed)
Allopurinol ordered during the virtual UC visit on 03/31/21.

## 2022-04-26 ENCOUNTER — Other Ambulatory Visit: Payer: Self-pay | Admitting: Nurse Practitioner

## 2022-04-26 ENCOUNTER — Other Ambulatory Visit: Payer: Self-pay | Admitting: Family Medicine

## 2022-04-26 DIAGNOSIS — K219 Gastro-esophageal reflux disease without esophagitis: Secondary | ICD-10-CM

## 2022-04-27 NOTE — Telephone Encounter (Signed)
Requested Prescriptions  Pending Prescriptions Disp Refills   Potassium Chloride ER 20 MEQ TBCR [Pharmacy Med Name: Potassium Chloride ER 20 MEQ Oral Tablet Extended Release] 90 tablet 0    Sig: Take 1 tablet by mouth once daily     Endocrinology:  Minerals - Potassium Supplementation Passed - 04/26/2022 10:50 AM      Passed - K in normal range and within 360 days    Potassium  Date Value Ref Range Status  12/29/2021 3.8 3.5 - 5.1 mmol/L Final         Passed - Cr in normal range and within 360 days    Creatinine, Ser  Date Value Ref Range Status  12/29/2021 0.88 0.44 - 1.00 mg/dL Final         Passed - Valid encounter within last 12 months    Recent Outpatient Visits           8 months ago Type 2 diabetes mellitus with other specified complication, without long-term current use of insulin ([place])   [place] Hermann, [place], MD   1 year ago Type 2 diabetes mellitus with other specified complication, without long-term current use of insulin ([place])   [place], Charlane Ferretti, MD   1 year ago Otalgia of both [place] Charlott Rakes, MD   1 year ago Annual physical exam   [place], Enobong, MD   2 years ago Vaginal itching   [place], MD       Future Appointments             In 2 weeks Charlott Rakes, MD [place]

## 2022-04-28 ENCOUNTER — Telehealth: Payer: Self-pay

## 2022-04-28 NOTE — Telephone Encounter (Signed)
Ozempic prior auth submitted to ins via [place] today. Confirmation #[id number]

## 2022-04-28 NOTE — Telephone Encounter (Signed)
Approved until 04/28/2023

## 2022-05-13 ENCOUNTER — Ambulatory Visit: Payer: Medicaid Other | Admitting: Family Medicine

## 2022-05-25 ENCOUNTER — Ambulatory Visit: Payer: Medicaid Other | Admitting: Family Medicine

## 2022-06-18 ENCOUNTER — Other Ambulatory Visit: Payer: Self-pay | Admitting: Family Medicine

## 2022-06-18 DIAGNOSIS — E1169 Type 2 diabetes mellitus with other specified complication: Secondary | ICD-10-CM

## 2022-08-04 ENCOUNTER — Telehealth: Payer: Self-pay | Admitting: Family Medicine

## 2022-08-04 ENCOUNTER — Other Ambulatory Visit: Payer: Self-pay | Admitting: Family Medicine

## 2022-08-04 DIAGNOSIS — E1169 Type 2 diabetes mellitus with other specified complication: Secondary | ICD-10-CM

## 2022-08-04 DIAGNOSIS — M93001 Unspecified slipped upper femoral epiphysis (nontraumatic), right hip: Secondary | ICD-10-CM

## 2022-08-04 MED ORDER — OZEMPIC (0.25 OR 0.5 MG/DOSE) 2 MG/3ML ~~LOC~~ SOPN: PEN_INJECTOR | SUBCUTANEOUS | 0 refills | Status: DC

## 2022-08-04 NOTE — Telephone Encounter (Signed)
Copied from CRM #[id number]. Topic: Referral - Request for Referral >> Aug 04, 2022  2:30 PM Yolanda T wrote: Has patient seen PCP for this complaint? Yes.   Referral for which specialty: Orthopedic Specialist Preferred provider/office: unknown Reason for referral: hip 

## 2022-08-04 NOTE — Telephone Encounter (Signed)
Copied from CRM [id number]. Topic: Referral - Request for Referral >> Aug 04, 2022  2:30 PM Patsy Lager T wrote: Has patient seen PCP for this complaint? Yes.   Referral for which specialty: Orthopedic Specialist Preferred provider/office: unknown Reason for referral: hip

## 2022-08-04 NOTE — Telephone Encounter (Signed)
Medication Refill - Medication: Semaglutide,0.25 or 0.5MG /DOS, (OZEMPIC, 0.25 OR 0.5 MG/DOSE,) 2 MG/3ML SOPN [Pharmacy Med Name: Ozempic (0.25 or 0.5 MG/DOSE) 2 MG/3ML Subcutaneous Solution Pen-injector]   Has the patient contacted their pharmacy? Yes.    Preferred Pharmacy (with phone number or street name):  [facility] [address] [place]),  - [address] Phone: [phone]  Fax: [phone]     Has the patient been seen for an appointment in the last year OR does the patient have an upcoming appointment? Yes.    Agent: Please be advised that RX refills may take up to 3 business days. We ask that you follow-up with your pharmacy.

## 2022-08-04 NOTE — Telephone Encounter (Signed)
Does patient need a virtual visit to discuss?

## 2022-08-04 NOTE — Telephone Encounter (Signed)
Called pt and made appt for pt- courtesy refill  Requested Prescriptions  Pending Prescriptions Disp Refills   Semaglutide,0.25 or 0.5MG /DOS, (OZEMPIC, 0.25 OR 0.5 MG/DOSE,) 2 MG/3ML SOPN 3 mL 0    Sig: INJECT 0.5MG  INTO THE SKIN ONCE A WEEK.     Endocrinology:  Diabetes - GLP-1 Receptor Agonists - semaglutide Failed - 08/04/2022  3:42 PM      Failed - HBA1C in normal range and within 180 days    HbA1c, POC (controlled diabetic range)  Date Value Ref Range Status  04/15/2021 7.1 (A) 0.0 - 7.0 % Final   Hgb A1c MFr Bld  Date Value Ref Range Status  07/14/2021 6.8 (H) 4.8 - 5.6 % Final    Comment:    (NOTE) Pre diabetes:          5.7%-6.4%  Diabetes:              >6.4%  Glycemic control for   <7.0% adults with diabetes          Failed - Valid encounter within last 6 months    Recent Outpatient Visits           11 months ago Type 2 diabetes mellitus with other specified complication, without long-term current use of insulin (HCC)   [place] [facility] [place], Odette Horns, MD   1 year ago Type 2 diabetes mellitus with other specified complication, without long-term current use of insulin (HCC)   [place] [facility] [place], Odette Horns, MD   1 year ago Otalgia of both ears   [place] [facility] Hoy Register, MD   2 years ago Annual physical exam   [place] [facility] Hoy Register, MD   2 years ago Vaginal itching   [place] [facility] Marcine Matar, MD       Future Appointments             In 3 weeks Sharon Seller, [place]  [facility]   In 2 months Hoy Register, MD  [facility]            Passed - Cr in normal range and within 360 days    Creatinine, Ser  Date Value Ref Range Status  12/29/2021 0.88 0.44 - 1.00 mg/dL Final

## 2022-08-04 NOTE — Telephone Encounter (Signed)
Duplicate

## 2022-08-05 NOTE — Telephone Encounter (Signed)
Pt has been informed of referral.

## 2022-08-05 NOTE — Addendum Note (Signed)
Addended by: Hoy Register on: 08/05/2022 01:48 PM   Modules accepted: Orders

## 2022-08-05 NOTE — Telephone Encounter (Signed)
Referral has been placed. 

## 2022-08-06 ENCOUNTER — Other Ambulatory Visit: Payer: Self-pay | Admitting: Family Medicine

## 2022-08-06 NOTE — Telephone Encounter (Signed)
Requested medication (s) are due for refill today: yes  Requested medication (s) are on the active medication list: yes  Last refill:  09/15/21 #90  Future visit scheduled: yes  Notes to clinic:  overdue lab work and OV   Requested Prescriptions  Pending Prescriptions Disp Refills   dapagliflozin propanediol (FARXIGA) 5 MG TABS tablet [Pharmacy Med Name: Dapagliflozin Propanediol 5 MG Oral Tablet] 30 tablet 0    Sig: TAKE 1 TABLET BY MOUTH ONCE DAILY BEFORE BREAKFAST. DISCONTINUE METFORMIN.     Endocrinology:  Diabetes - SGLT2 Inhibitors Failed - 08/06/2022  9:55 AM      Failed - HBA1C is between 0 and 7.9 and within 180 days    HbA1c, POC (controlled diabetic range)  Date Value Ref Range Status  04/15/2021 7.1 (A) 0.0 - 7.0 % Final   Hgb A1c MFr Bld  Date Value Ref Range Status  07/14/2021 6.8 (H) 4.8 - 5.6 % Final    Comment:    (NOTE) Pre diabetes:          5.7%-6.4%  Diabetes:              >6.4%  Glycemic control for   <7.0% adults with diabetes          Failed - Valid encounter within last 6 months    Recent Outpatient Visits           11 months ago Type 2 diabetes mellitus with other specified complication, without long-term current use of insulin (HCC)   [place] [facility] [place], [place], MD   1 year ago Type 2 diabetes mellitus with other specified complication, without long-term current use of insulin (HCC)   [facility] [place], Odette Horns, MD   1 year ago Otalgia of both ears   [place] [facility] Hoy Register, MD   2 years ago Annual physical exam   [place] [facility] Hoy Register, MD   2 years ago Vaginal itching   [place] Marcine Matar, MD       Future Appointments             In 2 weeks Keo, Marzella Schlein, PA-C [place] [facility]   In 2 months Alvis Lemmings,  [place], MD [facility]            Passed - Cr in normal range and within 360 days    Creatinine, Ser  Date Value Ref Range Status  12/29/2021 0.88 0.44 - 1.00 mg/dL Final         Passed - eGFR in normal range and within 360 days    GFR calc Af Amer  Date Value Ref Range Status  05/14/2020 128 >59 mL/min/1.73 Final    Comment:    **In accordance with recommendations from the NKF-ASN Task force,**   [facility] is in the process of updating its eGFR calculation to the   2021 CKD-EPI creatinine equation that estimates kidney function   without a race variable.    GFR, Estimated  Date Value Ref Range Status  12/29/2021 >60 >60 mL/min Final    Comment:    (NOTE) Calculated using the CKD-EPI Creatinine Equation (2021) Performed at [facility], [address] [place], [place] [address]    eGFR  Date Value Ref Range Status  08/13/2021 109 >59 mL/min/1.73 Final

## 2022-08-09 NOTE — Progress Notes (Unsigned)
Office Visit Note   Patient: Kelly Byrd           Date of Birth: 1975/09/13           MRN: [id number] Visit Date: 08/10/2022              Requested by: Hoy Register, MD [address] [place],  [place] [address] PCP: Hoy Register, MD   Assessment & Plan: Visit Diagnoses: No diagnosis found.  Plan: ***  Follow-Up Instructions: No follow-ups on file.   Orders:  No orders of the defined types were placed in this encounter.  No orders of the defined types were placed in this encounter.     Procedures: No procedures performed   Clinical Data: No additional findings.   Subjective: No chief complaint on file.   HPI  Review of Systems  Constitutional: Negative.   HENT: Negative.    Eyes: Negative.   Respiratory: Negative.    Cardiovascular: Negative.   Endocrine: Negative.   Musculoskeletal: Negative.   Neurological: Negative.   Hematological: Negative.   Psychiatric/Behavioral: Negative.    All other systems reviewed and are negative.   Objective: Vital Signs: LMP 09/27/2019   Physical Exam Vitals and nursing note reviewed.  Constitutional:      Appearance: She is well-developed.  HENT:     Head: Atraumatic.     Nose: Nose normal.  Eyes:     Extraocular Movements: Extraocular movements intact.  Cardiovascular:     Pulses: Normal pulses.  Pulmonary:     Effort: Pulmonary effort is normal.  Abdominal:     Palpations: Abdomen is soft.  Musculoskeletal:     Cervical back: Neck supple.  Skin:    General: Skin is warm.     Capillary Refill: Capillary refill takes less than 2 seconds.  Neurological:     Mental Status: She is alert. Mental status is at baseline.  Psychiatric:        Behavior: Behavior normal.        Thought Content: Thought content normal.        Judgment: Judgment normal.   Ortho Exam  Specialty Comments:  No specialty comments available.  Imaging: No results found.   PMFS  History: Patient Active Problem List   Diagnosis Date Noted  . Anaphylactic reaction 12/29/2021  . Obesity, Class III, BMI 40-49.9 (morbid obesity) (HCC) 12/29/2021  . Uncontrolled type 2 diabetes mellitus with hyperglycemia, without long-term current use of insulin (HCC) 12/29/2021  . CKD (chronic kidney disease) stage 2, GFR 60-89 ml/min   . Rhabdomyolysis 10/16/2020  . Syncope and collapse 10/16/2020  . S/P abdominal supracervical subtotal hysterectomy 10/23/2019  . Abdominal adhesions   . Abnormal uterine bleeding (AUB) 10/11/2019  . Intramural leiomyoma of uterus 10/11/2019  . Angioedema 06/26/2019  . Facial swelling   . Allergic reaction 06/25/2019  . Hypokalemia 06/25/2019  . Abdominal pain 06/25/2019  . Major depressive disorder, recurrent severe without psychotic features (HCC) 10/11/2017  . Cocaine abuse (HCC) 10/11/2017  . Lumbar radiculopathy 05/31/2017  . Hyperlipidemia 05/31/2017  . GERD (gastroesophageal reflux disease) 08/05/2016  . Fibroids 09/19/2015  . Hypertension 08/29/2015  . Asthma 08/29/2015  . Back pain 08/29/2015  . 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 Anemia, iron deficiency 10/29/2014  . Menometrorrhagia 10/29/2014  . Migraine 10/27/2014   Past Medical History:  Diagnosis Date  . Asthma   . Back pain   . Chronic kidney disease    frequent infrections  .  Depression   . Dislocation of hip (HCC)    right hip  . GERD (gastroesophageal reflux disease)   . Hypertension   . Migraines   . Pre-diabetes   . Sickle cell trait (HCC)     Family History  Problem Relation Age of Onset  . Hypertension Mother   . Hypertension Father   . Breast cancer Maternal Grandmother   . Diabetes Mellitus II Maternal Grandmother   . Breast cancer Paternal Grandmother   . Lupus Other     Past Surgical History:  Procedure Laterality Date  . CESAREAN SECTION     2  . CYSTOSCOPY N/A 10/23/2019   Procedure: CYSTOSCOPY;  Surgeon: Vena Austria, MD;   Location: [facility] ORS;  Service: Gynecology;  Laterality: N/A;  . TOTAL LAPAROSCOPIC HYSTERECTOMY WITH SALPINGECTOMY Bilateral 10/23/2019   Procedure: TOTAL LAPAROSCOPIC HYSTERECTOMY CONVERTED TO OPEN SUPRACERVICAL HYSTERECTOMY;  Surgeon: Vena Austria, MD;  Location: [facility] ORS;  Service: Gynecology;  Laterality: Bilateral;  . TUBAL LIGATION    . upper teeth     Social History   Occupational History  . Occupation: disabled  Tobacco Use  . Smoking status: Every Day    Packs/day: .25    Types: Cigarettes  . Smokeless tobacco: Never  Vaping Use  . Vaping Use: Every day  Substance and Sexual Activity  . Alcohol use: Yes    Alcohol/week: 1.0 standard drink of alcohol    Types: 1 Cans of beer per week  . Drug use: Not Currently    Types: Cocaine    Comment: + cocaine 09/2017   Denies use on 7/21 2021  . Sexual activity: Yes    Birth control/protection: Surgical

## 2022-08-10 ENCOUNTER — Encounter: Payer: Self-pay | Admitting: Orthopaedic Surgery

## 2022-08-10 ENCOUNTER — Ambulatory Visit (INDEPENDENT_AMBULATORY_CARE_PROVIDER_SITE_OTHER): Payer: Medicaid Other | Admitting: Orthopaedic Surgery

## 2022-08-10 ENCOUNTER — Other Ambulatory Visit: Payer: Self-pay

## 2022-08-10 ENCOUNTER — Other Ambulatory Visit (INDEPENDENT_AMBULATORY_CARE_PROVIDER_SITE_OTHER): Payer: Medicaid Other

## 2022-08-10 VITALS — Ht 64.0 in | Wt 256.0 lb

## 2022-08-10 DIAGNOSIS — Z6841 Body Mass Index (BMI) 40.0 and over, adult: Secondary | ICD-10-CM | POA: Diagnosis not present

## 2022-08-10 DIAGNOSIS — M25551 Pain in right hip: Secondary | ICD-10-CM

## 2022-08-11 ENCOUNTER — Other Ambulatory Visit: Payer: Self-pay

## 2022-08-12 ENCOUNTER — Other Ambulatory Visit: Payer: Self-pay

## 2022-08-16 ENCOUNTER — Ambulatory Visit (INDEPENDENT_AMBULATORY_CARE_PROVIDER_SITE_OTHER): Payer: Medicaid Other | Admitting: Family Medicine

## 2022-08-16 ENCOUNTER — Encounter (INDEPENDENT_AMBULATORY_CARE_PROVIDER_SITE_OTHER): Payer: Self-pay | Admitting: Family Medicine

## 2022-08-16 VITALS — BP 123/82 | HR 90 | Temp 98.0°F | Ht 63.5 in | Wt 249.2 lb

## 2022-08-16 DIAGNOSIS — Z7985 Long-term (current) use of injectable non-insulin antidiabetic drugs: Secondary | ICD-10-CM

## 2022-08-16 DIAGNOSIS — I152 Hypertension secondary to endocrine disorders: Secondary | ICD-10-CM | POA: Diagnosis not present

## 2022-08-16 DIAGNOSIS — E1169 Type 2 diabetes mellitus with other specified complication: Secondary | ICD-10-CM | POA: Diagnosis not present

## 2022-08-16 DIAGNOSIS — E1165 Type 2 diabetes mellitus with hyperglycemia: Secondary | ICD-10-CM | POA: Diagnosis not present

## 2022-08-16 DIAGNOSIS — Z6841 Body Mass Index (BMI) 40.0 and over, adult: Secondary | ICD-10-CM

## 2022-08-16 DIAGNOSIS — E785 Hyperlipidemia, unspecified: Secondary | ICD-10-CM

## 2022-08-16 DIAGNOSIS — E1159 Type 2 diabetes mellitus with other circulatory complications: Secondary | ICD-10-CM | POA: Diagnosis not present

## 2022-08-16 NOTE — Progress Notes (Signed)
Kelly Grippe, DO, ABFM, ABOM Department of Obesity Medicine  [facility] [address] [place], [place], [place] [address] Office: [phone]  /  Fax: [phone]   Initial Visit  Kelly Byrd was seen in clinic today to evaluate for obesity. She is interested in losing weight to improve overall health and reduce the risk of weight related complications. She presents today to review program treatment options, initial physical assessment, and evaluation.     She was referred by: [facility]   When asked what else they would they like to accomplish? She states: Improve energy levels and physical activity, Improve existing medical conditions, Reduce number of medications, and Improve quality of life  When asked how has your weight affected you? She states: Contributed to orthopedic problems or mobility issues  Some associated conditions: Hypertension, Hyperlipidemia, and Diabetes  Contributing factors: Medications, Stress, Reduced physical activity, and Mental health problems  Weight promoting medications identified: Psychotropic medications  Current nutrition plan: She has been eating more salads, decreasing her simple carbs, and increasing water intake.    Current level of physical activity: Limited due to chronic pain or orthopedic problems  Current or previous pharmacotherapy: GLP-1 and SGLT-2 in the past. Pt endorses she has been on Ozempic for the past year.   Response to medication: She endorses that the Ozempic has not been helping with her hunger/cravings.    Past medical history includes:   Past Medical History:  Diagnosis Date   Asthma    Back pain    Chronic kidney disease    frequent infrections   Depression    Dislocation of hip (HCC)    right hip   GERD (gastroesophageal reflux disease)    Hypertension    Migraines    Pre-diabetes    Sickle cell trait (HCC)     Reviewed by clinician on day of visit:  allergies, medications, problem list, medical history, surgical history, family history, social history, and previous encounter notes pertinent to obesity diagnosis.  Objective:   BP 123/82   Pulse 90   Temp 98 F (36.7 C)   Ht 5' 3.5" (1.613 m)   Wt 249 lb 3.2 oz (113 kg)   LMP 09/27/2019   SpO2 95%   BMI 43.45 kg/m  She was weighed on the bioimpedance scale: Body mass index is 43.45 kg/m.  Visceral Fat %: 13, Body Fat %: 43.7   General: Well Developed, well nourished, and in no acute distress.  HEENT: Normocephalic, atraumatic Skin: Warm and dry, cap RF less 2 sec, good turgor Chest:  Normal excursion, shape, no gross abn Respiratory: speaking in full sentences, no conversational dyspnea NeuroM-Sk: Ambulates w/o assistance, moves * 4 Psych: A and O *3, insight good, mood-full   Assessment and Plan:    Uncontrolled type 2 diabetes mellitus with hyperglycemia, without long-term current use of insulin (HCC) Assessment: Condition is not optimized.  Lab Results  Component Value Date   HGBA1C 6.8 (H) 07/14/2021   HGBA1C 7.1 (A) 04/15/2021   HGBA1C 6.3 (H) 10/17/2020  - She was first diagnosed with Diabetes 2-3 years ago.  - She has been compliant with Semaglutide 0.5 mg weekly and Farziga 5 mg once daily. Denies any side effects.  Plan: - Check A1c and Fasting Insulin - Continue with meds as prescribed by PCP.   - Discussed with pt that her A1c levels can improve with weight loss and decreasing simple carbs, increasing proteins.   Hypertension associated with  diabetes (HCC) Assessment: Condition is stable. Last 3 blood pressure readings in our office are as follows: BP Readings from Last 3 Encounters:  08/16/22 123/82  12/30/21 131/77  08/13/21 135/88  - Patient endorses she has a family history of high blood pressure and has had hypertension since she was 47 y/o.  - No issues with Norvasc 10 mg daily. Denies any side effects.  Plan: - Check CMP  - Continue with  antihypertensive medication as recommended by PCP.  - Discussed with pt how weight loss can improve her blood pressure.  - If patient decides to join program, we will begin to monitor her blood pressure as it relates to her weight loss journey.    Hyperlipidemia associated with type 2 diabetes mellitus (HCC) Assessment: Condition is not optimized. Lab Results  Component Value Date   CHOL 177 10/14/2017   HDL 45 10/14/2017   LDLCALC 111 (H) 10/14/2017   TRIG 106 10/14/2017   CHOLHDL 3.9 10/14/2017  - Pt also has a family history of high cholesterol.  - Reports good compliance and tolerance with Lipitor 20 mg daily. Denies any side effects.  Plan: - Check FLP and CMP  - Continue with med as recommended by PCP.  - Discussed with pt how weight loss is likely to improve these values.  - If patient decides to join program, we will begin monitoring condition alongside PCP/specialist.    Obesity, Class III, BMI 40-49.9 (morbid obesity) (HCC) Assessment: Condition is Not optimized. Fat mass  is 109 lbs. Muscle mass is 133.2 lbs. Total body water is 86.8 lbs.   Plan:  She will work on gathering support from family and friends to begin weight loss journey. Work on eliminating or reducing the presence of highly processed, calorie dense foods in the home. Complete provided nutritional and psychosocial assessment questionnaire prior to her next visit.  Assess for cardiometabolic complications and nutritional deficiencies via fasting serologies.  Assess REE via indirect calorimetry to guide the creation of a reduced calorie, high protein meal plan to promote loss of fat mass.    Kelly Byrd will follow up in the next 1-2 weeks to review the above steps and continue evaluation and treatment of their disease of obesity   Obesity Education Performed Today:  She was weighed on the bioimpedance scale and results were discussed and documented above  We discussed obesity as a disease  and the importance of a more detailed evaluation of all the factors contributing to the disease.  We discussed the importance of long term lifestyle changes which include nutrition, exercise and behavioral modifications as well as the importance of customizing this to her specific health and social needs.  We discussed the benefits of reaching a healthier weight to alleviate the symptoms of existing conditions and reduce the risks of the biomechanical, metabolic and psychological effects of obesity.   Kelly Byrd appears to be in the action stage of change and states they are ready to start intensive lifestyle modifications and behavioral modifications.  40  minutes was spent today on this visit including the above counseling, pre-visit chart review, and post-visit documentation.   Attestations:   Reviewed by clinician on day of visit: allergies, medications, problem list, medical history, surgical history, family history, social history, and previous encounter notes.  I,Special Puri,acting as a scribe for [employer], DO.,have documented all relevant documentation on the behalf of Kelly Lot, DO,as directed by  Kelly Lot, DO while in the presence of Kelly Lot,  DO.   I, Kelly Lot, DO, have reviewed all documentation for this visit. The documentation on 08/16/22 for the exam, diagnosis, procedures, and orders are all accurate and complete.

## 2022-08-25 ENCOUNTER — Ambulatory Visit: Payer: Medicaid Other | Admitting: Physician Assistant

## 2022-08-25 ENCOUNTER — Encounter: Payer: Self-pay | Admitting: Physician Assistant

## 2022-08-25 ENCOUNTER — Ambulatory Visit: Payer: Medicaid Other | Attending: Physician Assistant | Admitting: Physician Assistant

## 2022-08-25 ENCOUNTER — Other Ambulatory Visit: Payer: Self-pay

## 2022-08-25 ENCOUNTER — Telehealth: Payer: Self-pay | Admitting: Family Medicine

## 2022-08-25 VITALS — BP 145/62 | HR 81 | Wt 261.4 lb

## 2022-08-25 DIAGNOSIS — Z91148 Patient's other noncompliance with medication regimen for other reason: Secondary | ICD-10-CM | POA: Insufficient documentation

## 2022-08-25 DIAGNOSIS — J4520 Mild intermittent asthma, uncomplicated: Secondary | ICD-10-CM | POA: Diagnosis not present

## 2022-08-25 DIAGNOSIS — I10 Essential (primary) hypertension: Secondary | ICD-10-CM | POA: Insufficient documentation

## 2022-08-25 DIAGNOSIS — M5416 Radiculopathy, lumbar region: Secondary | ICD-10-CM | POA: Insufficient documentation

## 2022-08-25 DIAGNOSIS — Z7985 Long-term (current) use of injectable non-insulin antidiabetic drugs: Secondary | ICD-10-CM

## 2022-08-25 DIAGNOSIS — Z79899 Other long term (current) drug therapy: Secondary | ICD-10-CM | POA: Insufficient documentation

## 2022-08-25 DIAGNOSIS — E7800 Pure hypercholesterolemia, unspecified: Secondary | ICD-10-CM | POA: Diagnosis not present

## 2022-08-25 DIAGNOSIS — E1165 Type 2 diabetes mellitus with hyperglycemia: Secondary | ICD-10-CM | POA: Diagnosis present

## 2022-08-25 DIAGNOSIS — Z7984 Long term (current) use of oral hypoglycemic drugs: Secondary | ICD-10-CM | POA: Diagnosis not present

## 2022-08-25 DIAGNOSIS — M1A9XX0 Chronic gout, unspecified, without tophus (tophi): Secondary | ICD-10-CM | POA: Insufficient documentation

## 2022-08-25 DIAGNOSIS — R079 Chest pain, unspecified: Secondary | ICD-10-CM | POA: Insufficient documentation

## 2022-08-25 DIAGNOSIS — R9431 Abnormal electrocardiogram [ECG] [EKG]: Secondary | ICD-10-CM | POA: Insufficient documentation

## 2022-08-25 DIAGNOSIS — R0789 Other chest pain: Secondary | ICD-10-CM

## 2022-08-25 LAB — GLUCOSE, POCT (MANUAL RESULT ENTRY): POC Glucose: 122 mg/dl — AB (ref 70–99)

## 2022-08-25 LAB — POCT GLYCOSYLATED HEMOGLOBIN (HGB A1C): HbA1c, POC (controlled diabetic range): 7.3 % — AB (ref 0.0–7.0)

## 2022-08-25 MED ORDER — ALLOPURINOL 100 MG PO TABS: ORAL_TABLET | ORAL | 6 refills | Status: DC

## 2022-08-25 MED ORDER — DAPAGLIFLOZIN PROPANEDIOL 5 MG PO TABS: ORAL_TABLET | ORAL | 3 refills | Status: DC

## 2022-08-25 MED ORDER — AMLODIPINE BESYLATE 10 MG PO TABS: 10.0000 mg | ORAL_TABLET | Freq: Every day | ORAL | 1 refills | Status: DC

## 2022-08-25 MED ORDER — ATORVASTATIN CALCIUM 20 MG PO TABS: 20.0000 mg | ORAL_TABLET | Freq: Every day | ORAL | 1 refills | Status: DC

## 2022-08-25 MED ORDER — VENTOLIN HFA 108 (90 BASE) MCG/ACT IN AERS: INHALATION_SPRAY | RESPIRATORY_TRACT | 1 refills | Status: DC

## 2022-08-25 MED ORDER — ALBUTEROL SULFATE (2.5 MG/3ML) 0.083% IN NEBU: 2.5000 mg | INHALATION_SOLUTION | Freq: Four times a day (QID) | RESPIRATORY_TRACT | 2 refills | Status: AC | PRN

## 2022-08-25 MED ORDER — OZEMPIC (0.25 OR 0.5 MG/DOSE) 2 MG/3ML ~~LOC~~ SOPN: PEN_INJECTOR | SUBCUTANEOUS | 3 refills | Status: DC

## 2022-08-25 NOTE — Progress Notes (Signed)
Patient ID: Kelly Byrd, female   DOB: 1975/10/10, 47 y.o.   MRN: [id number]   Kelly Byrd, is a 47 y.o. female  [id number]  [id number]  DOB - 1975/10/17  Chief Complaint  Patient presents with   Diabetes       Subjective:   Kelly Byrd is a 47 y.o. female here today for  med RF.  She has no-showed and not kept up with quarterly visits.  She has been out of meds for a while.    She is c/o chest pain in her L breast/L chest.  Denies new SOB/dizziness.  No radiating pain.  Pain is constant.  Not better or worse with movement.  NKI.    Requesting updated letter by PCP stating to she is disabled to continue to have help with housing No problems updated.  ALLERGIES: Allergies  Allergen Reactions   Ace Inhibitors Swelling    Throat and face   Angiotensin Receptor Blockers Anaphylaxis   Lisinopril Other (See Comments)    Swelling--caused the pt to stay in the hospital   Peanuts [Peanut Oil] Other (See Comments)    Swelling--caused the pt to stay in the hospital    PAST MEDICAL HISTORY: Past Medical History:  Diagnosis Date   Asthma    Back pain    Chronic kidney disease    frequent infrections   Depression    Dislocation of hip (HCC)    right hip   GERD (gastroesophageal reflux disease)    Hypertension    Migraines    Pre-diabetes    Sickle cell trait (HCC)     MEDICATIONS AT HOME: Prior to Admission medications   Medication Sig Start Date End Date Taking? Authorizing Provider  baclofen (LIORESAL) 10 MG tablet Take 10 mg by mouth in the morning and at bedtime.  07/16/19  Yes [provider]  Blood Glucose Monitoring Suppl (TRUE METRIX METER) DEVI 1 each by Does not apply route 3 (three) times daily. 04/15/21  Yes Hoy Register, MD  cetirizine (ZYRTEC) 10 MG tablet Take 1 tablet by mouth once daily 04/17/21  Yes Newlin, Enobong, MD  clonazePAM (KLONOPIN) 0.5 MG tablet Take 0.25 mg by mouth daily. 05/21/21  Yes [provider]  diclofenac Sodium (VOLTAREN) 1 % GEL Apply 1 application topically 4 (four) times daily as needed (pain.).  07/16/19  Yes [provider]  EPINEPHrine 0.3 mg/0.3 mL IJ SOAJ injection Inject 0.3 mg into the muscle as needed for anaphylaxis. 07/20/21  Yes Hoy Register, MD  fluticasone (FLONASE) 50 MCG/ACT nasal spray Use 2 spray(s) in each nostril once daily 02/02/21  Yes Newlin, Enobong, MD  glucose blood (TRUE METRIX BLOOD GLUCOSE TEST) test strip 1 each by Other route 3 (three) times daily. 04/15/21  Yes Hoy Register, MD  Misc. Devices MISC Rolling walker with seat 02/02/21  Yes Newlin, Enobong, MD  mometasone-formoterol (DULERA) 200-5 MCG/ACT AERO INHALE 2 PUFFS BY MOUTH IN THE MORNING AND AT BEDTIME 08/13/21  Yes Newlin, Enobong, MD  oxyCODONE-acetaminophen (PERCOCET) 10-325 MG tablet Take 1 tablet by mouth 3 (three) times daily as needed. 10/15/21  Yes [provider]  Potassium Chloride ER 20 MEQ TBCR Take 1 tablet by mouth once daily 04/27/22  Yes Newlin, Enobong, MD  sertraline (ZOLOFT) 100 MG tablet Take 100 mg by mouth daily. 10/08/19  Yes [provider]  topiramate (TOPAMAX) 50 MG tablet Take 50 mg by mouth daily. 08/08/21  Yes [provider]  traZODone (DESYREL) 100 MG tablet  Take 1 tablet (100 mg total) by mouth at bedtime. 10/14/17  Yes Starkes-Perry, Juel Burrow, FNP  TRUEplus Lancets 28G MISC 1 each by Does not apply route 3 (three) times daily. 04/15/21  Yes Hoy Register, MD  albuterol (PROVENTIL) (2.5 MG/3ML) 0.083% nebulizer solution Take 3 mLs (2.5 mg total) by nebulization every 6 (six) hours as needed for wheezing or shortness of breath (wheezing). 08/25/22   Anders Simmonds, PA-C  allopurinol (ZYLOPRIM) 100 MG tablet Start with 100 mg daily for one week then increase to 200 mg (two tablets daily for one week) and then increase to 300 mg( 3 tablets) daily and continue this dose daily as tolerated 08/25/22   Georgian Co M, PA-C  amLODipine  (NORVASC) 10 MG tablet Take 1 tablet (10 mg total) by mouth daily. 08/25/22   Anders Simmonds, PA-C  atorvastatin (LIPITOR) 20 MG tablet Take 1 tablet (20 mg total) by mouth daily. 08/25/22   Anders Simmonds, PA-C  dapagliflozin propanediol (FARXIGA) 5 MG TABS tablet TAKE 1 TABLET BY MOUTH ONCE DAILY BEFORE BREAKFAST. DISCONTINUE METFORMIN. 08/25/22   Anders Simmonds, PA-C  famotidine (PEPCID) 20 MG tablet Take 1 tablet (20 mg total) by mouth 2 (two) times daily. 03/31/22 04/30/22  Viviano Simas, FNP  Semaglutide,0.25 or 0.5MG /DOS, (OZEMPIC, 0.25 OR 0.5 MG/DOSE,) 2 MG/3ML SOPN INJECT 0.5MG  INTO THE SKIN ONCE A WEEK. 08/25/22   Anders Simmonds, PA-C  VENTOLIN HFA 108 (90 Base) MCG/ACT inhaler INHALE 2 PUFFS BY MOUTH EVERY 6 HOURS AS NEEDED FOR WHEEZING OR SHORTNESS OF BREATH 08/25/22   Onofrio Klemp, Marzella Schlein, PA-C    ROS: Neg HEENT Neg resp Neg cardiac Neg GI Neg GU Neg psych Neg neuro  Objective:   Vitals:   08/25/22 1035  BP: (!) 145/62  Pulse: 81  SpO2: 95%  Weight: 261 lb 6.4 oz (118.6 kg)   Exam General appearance : seems sedated/groggy, I had to awaken her.  not in any distress. Speech sluggish. Not toxic looking HEENT: Atraumatic and Normocephalic Neck: Supple, no JVD. No cervical lymphadenopathy.  Chest: Good air entry bilaterally, CTAB.  No rales/rhonchi/wheezing CVS: S1 S2 regular, no murmurs. Extremities: B/L Lower Ext shows no edema, both legs are warm to touch Neurology: Awake alert, and oriented X 3, CN II-XII intact, Non focal Skin: No Rash  Data Review Lab Results  Component Value Date   HGBA1C 7.3 (A) 08/25/2022   HGBA1C 6.8 (H) 07/14/2021   HGBA1C 7.1 (A) 04/15/2021    Assessment & Plan   1. Type 2 diabetes mellitus with hyperglycemia, unspecified whether long term insulin use (HCC) Uncontrolled-off meds.  Resume meds and keep f/ups - Glucose (CBG) - HgB A1c - Comprehensive metabolic panel  2. Pure hypercholesterolemia - atorvastatin (LIPITOR) 20 MG  tablet; Take 1 tablet (20 mg total) by mouth daily.  Dispense: 90 tablet; Refill: 1 - Lipid panel - CBC with Differential/Platelet  3. Essential hypertension - Comprehensive metabolic panel - CBC with Differential/Platelet - amLODipine (NORVASC) 10 MG tablet; Take 1 tablet (10 mg total) by mouth daily.  Dispense: 90 tablet; Refill: 1  4. Chronic gout involving toe without tophus, unspecified cause, unspecified laterality - allopurinol (ZYLOPRIM) 100 MG tablet; Start with 100 mg daily for one week then increase to 200 mg (two tablets daily for one week) and then increase to 300 mg( 3 tablets) daily and continue this dose daily as tolerated  Dispense: 60 tablet; Refill: 6  5. Mild intermittent asthma without complication - VENTOLIN  HFA 108 (90 Base) MCG/ACT inhaler; INHALE 2 PUFFS BY MOUTH EVERY 6 HOURS AS NEEDED FOR WHEEZING OR SHORTNESS OF BREATH  Dispense: 18 g; Refill: 1  6. Lumbar radiculopathy Unchanged  7. Chest pain-EKG with prolonged QT-likely related to substance use. Counseled on cessation.  No ST segment changes indicating ischemia.      Return in about 3 months (around 11/25/2022) for PCP for chronic conditions.  The patient was given clear instructions to go to ER or return to medical center if symptoms don't improve, worsen or new problems develop. The patient verbalized understanding. The patient was told to call to get lab results if they haven't heard anything in the next week.      Georgian Co, PA-C [facility] [place], [place] [phone]   08/25/2022, 1:27 PM

## 2022-08-25 NOTE — Telephone Encounter (Signed)
She requested a letter during her visit with Marylene Land.  Letter has been sent to MyChart.  The form she dropped off is also available for pickup. Thanks

## 2022-08-25 NOTE — Telephone Encounter (Signed)
Attempted to contact patient and no VM picked up. I sent patient a mychart message

## 2022-08-26 LAB — COMPREHENSIVE METABOLIC PANEL
ALT: 16 IU/L (ref 0–32)
AST: 13 IU/L (ref 0–40)
Albumin/Globulin Ratio: 1.6 (ref 1.2–2.2)
Albumin: 3.9 g/dL (ref 3.9–4.9)
Alkaline Phosphatase: 63 IU/L (ref 44–121)
BUN/Creatinine Ratio: 12 (ref 9–23)
BUN: 7 mg/dL (ref 6–24)
Bilirubin Total: 0.3 mg/dL (ref 0.0–1.2)
CO2: 23 mmol/L (ref 20–29)
Calcium: 9.1 mg/dL (ref 8.7–10.2)
Chloride: 104 mmol/L (ref 96–106)
Creatinine, Ser: 0.6 mg/dL (ref 0.57–1.00)
Globulin, Total: 2.5 g/dL (ref 1.5–4.5)
Glucose: 76 mg/dL (ref 70–99)
Potassium: 4.1 mmol/L (ref 3.5–5.2)
Sodium: 143 mmol/L (ref 134–144)
Total Protein: 6.4 g/dL (ref 6.0–8.5)
eGFR: 113 mL/min/{1.73_m2} (ref >59)

## 2022-08-26 LAB — LIPID PANEL
Chol/HDL Ratio: 5 ratio — ABNORMAL HIGH (ref 0.0–4.4)
Cholesterol, Total: 222 mg/dL — ABNORMAL HIGH (ref 100–199)
HDL: 44 mg/dL (ref >39)
LDL Chol Calc (NIH): 154 mg/dL — ABNORMAL HIGH (ref 0–99)
Triglycerides: 134 mg/dL (ref 0–149)
VLDL Cholesterol Cal: 24 mg/dL (ref 5–40)

## 2022-08-26 LAB — CBC WITH DIFFERENTIAL/PLATELET
Basophils Absolute: 0 10*3/uL (ref 0.0–0.2)
Basos: 0 %
EOS (ABSOLUTE): 0.3 10*3/uL (ref 0.0–0.4)
Eos: 3 %
Hematocrit: 41.5 % (ref 34.0–46.6)
Hemoglobin: 13.3 g/dL (ref 11.1–15.9)
Immature Grans (Abs): 0 10*3/uL (ref 0.0–0.1)
Immature Granulocytes: 0 %
Lymphocytes Absolute: 2.5 10*3/uL (ref 0.7–3.1)
Lymphs: 25 %
MCH: 25.3 pg — ABNORMAL LOW (ref 26.6–33.0)
MCHC: 32 g/dL (ref 31.5–35.7)
MCV: 79 fL (ref 79–97)
Monocytes Absolute: 0.5 10*3/uL (ref 0.1–0.9)
Monocytes: 5 %
Neutrophils Absolute: 6.4 10*3/uL (ref 1.4–7.0)
Neutrophils: 67 %
Platelets: 341 10*3/uL (ref 150–450)
RBC: 5.25 x10E6/uL (ref 3.77–5.28)
RDW: 13.3 % (ref 11.7–15.4)
WBC: 9.7 10*3/uL (ref 3.4–10.8)

## 2022-10-18 ENCOUNTER — Ambulatory Visit: Payer: Medicaid Other | Admitting: Family Medicine

## 2022-11-25 ENCOUNTER — Other Ambulatory Visit: Payer: Self-pay

## 2022-11-25 ENCOUNTER — Encounter: Payer: Self-pay | Admitting: Family Medicine

## 2022-11-25 ENCOUNTER — Ambulatory Visit: Payer: MEDICAID | Attending: Family Medicine | Admitting: Family Medicine

## 2022-11-25 DIAGNOSIS — M1A9XX0 Chronic gout, unspecified, without tophus (tophi): Secondary | ICD-10-CM

## 2022-11-25 DIAGNOSIS — Z7985 Long-term (current) use of injectable non-insulin antidiabetic drugs: Secondary | ICD-10-CM

## 2022-11-25 DIAGNOSIS — E1165 Type 2 diabetes mellitus with hyperglycemia: Secondary | ICD-10-CM

## 2022-11-25 DIAGNOSIS — Z7984 Long term (current) use of oral hypoglycemic drugs: Secondary | ICD-10-CM

## 2022-11-25 DIAGNOSIS — J4520 Mild intermittent asthma, uncomplicated: Secondary | ICD-10-CM | POA: Diagnosis not present

## 2022-11-25 DIAGNOSIS — I10 Essential (primary) hypertension: Secondary | ICD-10-CM

## 2022-11-25 DIAGNOSIS — M93001 Unspecified slipped upper femoral epiphysis (nontraumatic), right hip: Secondary | ICD-10-CM

## 2022-11-25 DIAGNOSIS — Z889 Allergy status to unspecified drugs, medicaments and biological substances status: Secondary | ICD-10-CM

## 2022-11-25 DIAGNOSIS — E7800 Pure hypercholesterolemia, unspecified: Secondary | ICD-10-CM

## 2022-11-25 MED ORDER — EPINEPHRINE 0.3 MG/0.3ML IJ SOAJ: 0.3000 mg | INTRAMUSCULAR | 2 refills | Status: AC | PRN

## 2022-11-25 MED ORDER — TIRZEPATIDE 10 MG/0.5ML ~~LOC~~ SOAJ
10.0000 mg | SUBCUTANEOUS | 0 refills | Status: DC
  Filled 2022-11-25: qty 6, 84d supply, fill #0

## 2022-11-25 MED ORDER — ALLOPURINOL 100 MG PO TABS: 300.0000 mg | ORAL_TABLET | Freq: Every day | ORAL | 1 refills | Status: DC

## 2022-11-25 MED ORDER — VENTOLIN HFA 108 (90 BASE) MCG/ACT IN AERS
INHALATION_SPRAY | RESPIRATORY_TRACT | 1 refills | Status: DC
Start: 2022-11-25 — End: 2023-05-26

## 2022-11-25 MED ORDER — ATORVASTATIN CALCIUM 20 MG PO TABS
20.0000 mg | ORAL_TABLET | Freq: Every day | ORAL | 1 refills | Status: DC
Start: 2022-11-25 — End: 2023-12-13

## 2022-11-25 MED ORDER — TIRZEPATIDE 7.5 MG/0.5ML ~~LOC~~ SOAJ
7.5000 mg | SUBCUTANEOUS | 0 refills | Status: DC
  Filled 2022-11-25: qty 2, 28d supply, fill #0

## 2022-11-25 MED ORDER — TOPIRAMATE 50 MG PO TABS: 50.0000 mg | ORAL_TABLET | Freq: Every day | ORAL | 1 refills | Status: DC

## 2022-11-25 MED ORDER — POTASSIUM CHLORIDE ER 20 MEQ PO TBCR: 1.0000 | EXTENDED_RELEASE_TABLET | Freq: Every day | ORAL | 1 refills | Status: DC

## 2022-11-25 MED ORDER — DAPAGLIFLOZIN PROPANEDIOL 5 MG PO TABS: ORAL_TABLET | ORAL | 1 refills | Status: DC

## 2022-11-25 MED ORDER — DULERA 200-5 MCG/ACT IN AERO
INHALATION_SPRAY | RESPIRATORY_TRACT | 6 refills | Status: DC
Start: 2022-11-25 — End: 2023-05-26

## 2022-11-25 MED ORDER — AMLODIPINE BESYLATE 10 MG PO TABS
10.0000 mg | ORAL_TABLET | Freq: Every day | ORAL | 1 refills | Status: DC
Start: 2022-11-25 — End: 2023-12-13

## 2022-11-25 MED ORDER — TIRZEPATIDE 12.5 MG/0.5ML ~~LOC~~ SOAJ
12.5000 mg | SUBCUTANEOUS | 6 refills | Status: DC
  Filled 2022-11-25: qty 6, 84d supply, fill #0

## 2022-11-25 NOTE — Progress Notes (Signed)
Virtual Visit via Telephone Note  I connected with Kelly Byrd, on 11/25/2022 at 9:35 AM by telephone and verified that I am speaking with the correct person using two identifiers.   Consent: I discussed the limitations, risks, security and privacy concerns of performing an evaluation and management service by telephone and the availability of in person appointments. I also discussed with the patient that there may be a patient responsible charge related to this service. The patient expressed understanding and agreed to proceed.   Location of Patient: Home  Location of Provider: Home Office   Persons participating in Telemedicine visit: Kelly Byrd Dr. Alvis Lemmings     History of Present Illness: Kelly Byrd is a 47 y.o. year old female with  a history of hypertension, asthma, migraine, anemia (secondary to menorrhagia from fibroids), chronic low back pain from lumbar degenerative disc disease, slipped capital femoral epiphysis, type 2 diabetes mellitus (A1c 7.3)   Discussed the use of AI scribe software for clinical note transcription with the patient, who gave verbal consent to proceed.   . The patient reports that her blood sugar levels have been inconsistent, with readings as high as 300 and as low as 100. The patient is currently on Ozempic, but reports that she is out of refills. The patient also reports severe hip pain, which is being managed by an orthopedic doctor. The patient is considering surgery for the hip pain, but has been advised to lose weight before proceeding with the surgery.  The patient's asthma has been manageable, with the patient using albuterol and Dulera as needed. The patient reports needing refills for both medications. The patient's migraines are still present, but are being managed with Topamax, which the patient also needs a refill for. The patient's gout has been well-managed with allopurinol, with no recent flare-ups  reported.  The patient also reports severe allergic reactions, with multiple instances of facial swelling in the past month. The patient has been using an EpiPen and Benadryl to manage these reactions. The patient has seen an allergist in the past, but is seeking a second opinion due to the frequency and severity of the reactions.      Past Medical History:  Diagnosis Date   Asthma    Back pain    Chronic kidney disease    frequent infrections   Depression    Dislocation of hip (HCC)    right hip   GERD (gastroesophageal reflux disease)    Hypertension    Migraines    Pre-diabetes    Sickle cell trait (HCC)    Allergies  Allergen Reactions   Ace Inhibitors Swelling    Throat and face   Angiotensin Receptor Blockers Anaphylaxis   Lisinopril Other (See Comments)    Swelling--caused the pt to stay in the hospital   Peanuts [Peanut Oil] Other (See Comments)    Swelling--caused the pt to stay in the hospital    Current Outpatient Medications on File Prior to Visit  Medication Sig Dispense Refill   albuterol (PROVENTIL) (2.5 MG/3ML) 0.083% nebulizer solution Take 3 mLs (2.5 mg total) by nebulization every 6 (six) hours as needed for wheezing or shortness of breath (wheezing). 75 mL 2   allopurinol (ZYLOPRIM) 100 MG tablet Start with 100 mg daily for one week then increase to 200 mg (two tablets daily for one week) and then increase to 300 mg( 3 tablets) daily and continue this dose daily as tolerated 60 tablet 6   amLODipine (NORVASC)  10 MG tablet Take 1 tablet (10 mg total) by mouth daily. 90 tablet 1   atorvastatin (LIPITOR) 20 MG tablet Take 1 tablet (20 mg total) by mouth daily. 90 tablet 1   baclofen (LIORESAL) 10 MG tablet Take 10 mg by mouth in the morning and at bedtime.      Blood Glucose Monitoring Suppl (TRUE METRIX METER) DEVI 1 each by Does not apply route 3 (three) times daily. 1 each 0   cetirizine (ZYRTEC) 10 MG tablet Take 1 tablet by mouth once daily 90 tablet 0    clonazePAM (KLONOPIN) 0.5 MG tablet Take 0.25 mg by mouth daily.     dapagliflozin propanediol (FARXIGA) 5 MG TABS tablet TAKE 1 TABLET BY MOUTH ONCE DAILY BEFORE BREAKFAST. DISCONTINUE METFORMIN. 30 tablet 3   diclofenac Sodium (VOLTAREN) 1 % GEL Apply 1 application topically 4 (four) times daily as needed (pain.).      EPINEPHrine 0.3 mg/0.3 mL IJ SOAJ injection Inject 0.3 mg into the muscle as needed for anaphylaxis. 2 each 2   famotidine (PEPCID) 20 MG tablet Take 1 tablet (20 mg total) by mouth 2 (two) times daily. 60 tablet 0   fluticasone (FLONASE) 50 MCG/ACT nasal spray Use 2 spray(s) in each nostril once daily 16 g 3   glucose blood (TRUE METRIX BLOOD GLUCOSE TEST) test strip 1 each by Other route 3 (three) times daily. 100 each 12   Misc. Devices MISC Rolling walker with seat 1 each 0   mometasone-formoterol (DULERA) 200-5 MCG/ACT AERO INHALE 2 PUFFS BY MOUTH IN THE MORNING AND AT BEDTIME 13 g 6   oxyCODONE-acetaminophen (PERCOCET) 10-325 MG tablet Take 1 tablet by mouth 3 (three) times daily as needed.     Potassium Chloride ER 20 MEQ TBCR Take 1 tablet by mouth once daily 90 tablet 0   Semaglutide,0.25 or 0.5MG /DOS, (OZEMPIC, 0.25 OR 0.5 MG/DOSE,) 2 MG/3ML SOPN INJECT 0.5MG  INTO THE SKIN ONCE A WEEK. 3 mL 3   sertraline (ZOLOFT) 100 MG tablet Take 100 mg by mouth daily.     topiramate (TOPAMAX) 50 MG tablet Take 50 mg by mouth daily.     traZODone (DESYREL) 100 MG tablet Take 1 tablet (100 mg total) by mouth at bedtime. 30 tablet 0   TRUEplus Lancets 28G MISC 1 each by Does not apply route 3 (three) times daily. 100 each 11   VENTOLIN HFA 108 (90 Base) MCG/ACT inhaler INHALE 2 PUFFS BY MOUTH EVERY 6 HOURS AS NEEDED FOR WHEEZING OR SHORTNESS OF BREATH 18 g 1   No current facility-administered medications on file prior to visit.    ROS: See HPI  Observations/Objective: Awake, alert, oriented x3 Not in acute distress Normal mood      Latest Ref Rng & Units 08/25/2022   12:06  PM 12/29/2021    9:50 PM 12/29/2021    4:17 PM  CMP  Glucose 70 - 99 mg/dL 76   161   BUN 6 - 24 mg/dL 7   11   Creatinine 0.96 - 1.00 mg/dL 0.45  4.09  8.11   Sodium 134 - 144 mmol/L 143   137   Potassium 3.5 - 5.2 mmol/L 4.1   3.8   Chloride 96 - 106 mmol/L 104   105   CO2 20 - 29 mmol/L 23   23   Calcium 8.7 - 10.2 mg/dL 9.1   9.1   Total Protein 6.0 - 8.5 g/dL 6.4     Total Bilirubin 0.0 -  1.2 mg/dL 0.3     Alkaline Phos 44 - 121 IU/L 63     AST 0 - 40 IU/L 13     ALT 0 - 32 IU/L 16       Lipid Panel     Component Value Date/Time   CHOL 222 (H) 08/25/2022 1206   TRIG 134 08/25/2022 1206   HDL 44 08/25/2022 1206   CHOLHDL 5.0 (H) 08/25/2022 1206   CHOLHDL 3.9 10/14/2017 0619   VLDL 21 10/14/2017 0619   LDLCALC 154 (H) 08/25/2022 1206   LABVLDL 24 08/25/2022 1206    Lab Results  Component Value Date   HGBA1C 7.3 (A) 08/25/2022    Assessment and Plan:     Type 2 Diabetes Mellitus Suboptimal control with postprandial sugars reaching 300. Currently on Ozempic and Farxiga. Arc was 7.3 Will send off A1c and if elevated I will make no regimen change given changes were made today -Switch from Ozempic to [facility] to aid in weight loss and improve glycemic control. -Start Mounjaro 7.5mg  for 4 weeks, then increase to 10mg  for 4 weeks, and then to 12.5mg . -Check blood sugars regularly and report any hypoglycemic episodes. -Counseled on Diabetic diet, my plate method, 161 minutes of moderate intensity exercise/week Blood sugar logs with fasting goals of 80-120 mg/dl, random of less than 096 and in the event of sugars less than 60 mg/dl or greater than 045 mg/dl encouraged to notify the clinic. Advised on the need for annual eye exams, annual foot exams, Pneumonia vaccine.   Obesity Patient is struggling with weight loss, which is necessary for hip surgery. -Encourage continued efforts in diet and exercise. -Expect additional weight loss with the switch to [facility].  Hip  Pain Severe pain due to bone-on-bone contact. -History of SCFE - Patient is under the care of an orthopedic surgeon. -Continue current pain management plan. -Encourage weight loss to qualify for hip surgery.  Asthma Stable with occasional flare-ups. Currently on Albuterol and Dulera. -Refill Albuterol and Dulera prescriptions. -Continue current management and use rescue inhaler as needed.  Migraines Ongoing issue, managed with Topamax. -Refill Topamax prescription.  Hyperlipidemia Managed with statin therapy. -Continue current medication.  Hypertension Managed with Amlodipine. -Continue Amlodipine.  Gout No recent flares, managed with Allopurinol. -Continue Allopurinol.  Food Allergies Frequent severe allergic reactions requiring EpiPen use. -Refill EpiPen prescription. -Refer to a different allergist for a second opinion. -Advise patient to avoid known allergens.  General Health Maintenance -Schedule Pap smear and physical exam. -Contact [facility] to restart home health care services.        Follow Up Instructions: 3 months   I discussed the assessment and treatment plan with the patient. The patient was provided an opportunity to ask questions and all were answered. The patient agreed with the plan and demonstrated an understanding of the instructions.   The patient was advised to call back or seek an in-person evaluation if the symptoms worsen or if the condition fails to improve as anticipated.     I provided 23 minutes total of non-face-to-face time during this encounter.   Hoy Register, MD, FAAFP. [facility] [place], [place] [phone]   11/25/2022, 9:35 AM

## 2022-11-26 ENCOUNTER — Other Ambulatory Visit: Payer: Self-pay

## 2022-11-30 ENCOUNTER — Telehealth: Payer: Self-pay | Admitting: Family Medicine

## 2022-11-30 ENCOUNTER — Other Ambulatory Visit: Payer: Self-pay

## 2022-11-30 NOTE — Telephone Encounter (Signed)
Can you please complete PCS form for this patient? I had done this for her previously, not sure what the hold up is. She is requesting [facility] (Phone # [phone] - contact [place]). Thanks

## 2022-12-01 ENCOUNTER — Other Ambulatory Visit: Payer: Self-pay

## 2022-12-01 ENCOUNTER — Other Ambulatory Visit: Payer: Self-pay | Admitting: Family Medicine

## 2022-12-01 MED ORDER — TRULICITY 1.5 MG/0.5ML ~~LOC~~ SOAJ
1.5000 mg | SUBCUTANEOUS | 0 refills | Status: DC
  Filled 2022-12-01: qty 2, 28d supply, fill #0

## 2022-12-01 NOTE — Telephone Encounter (Signed)
PCS form has been completed and will be faxed once complete.

## 2022-12-01 NOTE — Progress Notes (Signed)
Insurance requires she fail both Ozempic and Trulicity prior to approving Mounjaro.  She has failed Ozempic hence I am writing prescription for Trulicity and if she fails this with inadequate weight loss, we will transition to [facility].

## 2022-12-02 ENCOUNTER — Other Ambulatory Visit: Payer: Self-pay

## 2022-12-03 ENCOUNTER — Other Ambulatory Visit: Payer: Self-pay

## 2022-12-06 ENCOUNTER — Other Ambulatory Visit: Payer: Self-pay

## 2022-12-08 ENCOUNTER — Other Ambulatory Visit: Payer: Self-pay

## 2022-12-10 ENCOUNTER — Other Ambulatory Visit: Payer: Self-pay

## 2022-12-20 ENCOUNTER — Encounter: Payer: MEDICAID | Admitting: Family Medicine

## 2023-01-26 ENCOUNTER — Ambulatory Visit: Payer: Self-pay

## 2023-01-26 NOTE — Telephone Encounter (Signed)
Chief Complaint: Urine frequency Symptoms: Pain 10/10 right side/back, pressure, feeling like not emptying bladder completely Frequency: onset 1 week Pertinent Negatives: Patient denies other symptoms Disposition: [] ED /[] Urgent Care (no appt availability in office) / [x] Appointment(In office/virtual)/ []  [facility]/ [] Home Care/ [] Refused Recommended Disposition /[] [facility]/ []  Follow-up with PCP Additional Notes: Advised UC, she says she can't go today. Scheduled in office tomorrow.    Summary: urinary discomfort   The patient has experienced right side discomfort for roughly 1 week  The patient has noticed significant pressure when they use the restroom  The patient shares that they are having no other symptoms  Please contact further when available         Reason for Disposition  Side (flank) or lower back pain present  Answer Assessment - Initial Assessment Questions 1. SYMPTOM: "What's the main symptom you're concerned about?" (e.g., frequency, incontinence)     Frequency, pressure, pain 2. ONSET: "When did the  symptoms  start?"     1 week 3. PAIN: "Is there any pain?" If Yes, ask: "How bad is it?" (Scale: 1-10; mild, moderate, severe)     10 4. CAUSE: "What do you think is causing the symptoms?"     Possible UTI 5. OTHER SYMPTOMS: "Do you have any other symptoms?" (e.g., blood in urine, fever, flank pain, pain with urination)     Right flank pain, pain with urination, pressure, unable to urinate a lot  Protocols used: Urinary Symptoms-A-AH

## 2023-01-27 ENCOUNTER — Encounter: Payer: Self-pay | Admitting: Physician Assistant

## 2023-01-27 ENCOUNTER — Ambulatory Visit: Payer: MEDICAID | Attending: Physician Assistant | Admitting: Physician Assistant

## 2023-01-27 ENCOUNTER — Other Ambulatory Visit (HOSPITAL_COMMUNITY)
Admission: RE | Admit: 2023-01-27 | Discharge: 2023-01-27 | Disposition: A | Discharge status: Home / Self Care | Payer: MEDICAID | Source: Ambulatory Visit | Attending: Physician Assistant | Admitting: Physician Assistant

## 2023-01-27 ENCOUNTER — Other Ambulatory Visit: Payer: Self-pay | Admitting: Family Medicine

## 2023-01-27 ENCOUNTER — Other Ambulatory Visit: Payer: Self-pay

## 2023-01-27 VITALS — BP 164/106 | HR 86 | Wt 248.8 lb

## 2023-01-27 DIAGNOSIS — R109 Unspecified abdominal pain: Secondary | ICD-10-CM

## 2023-01-27 DIAGNOSIS — N3001 Acute cystitis with hematuria: Secondary | ICD-10-CM | POA: Diagnosis present

## 2023-01-27 DIAGNOSIS — Z7984 Long term (current) use of oral hypoglycemic drugs: Secondary | ICD-10-CM

## 2023-01-27 DIAGNOSIS — Z7985 Long-term (current) use of injectable non-insulin antidiabetic drugs: Secondary | ICD-10-CM

## 2023-01-27 DIAGNOSIS — I10 Essential (primary) hypertension: Secondary | ICD-10-CM | POA: Diagnosis not present

## 2023-01-27 DIAGNOSIS — E1165 Type 2 diabetes mellitus with hyperglycemia: Secondary | ICD-10-CM

## 2023-01-27 DIAGNOSIS — R10A1 Flank pain, right side: Secondary | ICD-10-CM

## 2023-01-27 LAB — POCT URINALYSIS DIP (CLINITEK)
Bilirubin, UA: NEGATIVE
Glucose, UA: NEGATIVE mg/dL
Ketones, POC UA: NEGATIVE mg/dL
Nitrite, UA: POSITIVE — AB
POC PROTEIN,UA: >=300 — AB
Spec Grav, UA: >=1.03 — AB (ref 1.010–1.025)
Urobilinogen, UA: 0.2 U/dL
pH, UA: 6 (ref 5.0–8.0)

## 2023-01-27 LAB — POCT GLYCOSYLATED HEMOGLOBIN (HGB A1C): HbA1c, POC (controlled diabetic range): 7.4 % — AB (ref 0.0–7.0)

## 2023-01-27 LAB — GLUCOSE, POCT (MANUAL RESULT ENTRY): POC Glucose: 153 mg/dL — AB (ref 70–99)

## 2023-01-27 MED ORDER — TRULICITY 3 MG/0.5ML ~~LOC~~ SOAJ
3.0000 mg | SUBCUTANEOUS | 4 refills | Status: DC
  Filled 2023-01-27: qty 2, 28d supply, fill #0
  Filled 2023-02-24: qty 2, 28d supply, fill #1

## 2023-01-27 MED ORDER — CEFTRIAXONE SODIUM 500 MG IJ SOLR
500.0000 mg | Freq: Once | INTRAMUSCULAR | Status: AC
  Administered 2023-01-27: 500 mg via INTRAMUSCULAR

## 2023-01-27 MED ORDER — FLUCONAZOLE 150 MG PO TABS
150.0000 mg | ORAL_TABLET | Freq: Once | ORAL | 0 refills | Status: AC
  Filled 2023-01-27: qty 1, 1d supply, fill #0

## 2023-01-27 MED ORDER — DOXYCYCLINE HYCLATE 100 MG PO TABS
100.0000 mg | ORAL_TABLET | Freq: Two times a day (BID) | ORAL | 0 refills | Status: DC
  Filled 2023-01-27: qty 20, 10d supply, fill #0

## 2023-01-27 NOTE — Progress Notes (Signed)
Patient ID: Kelly Byrd, female   DOB: Apr 05, 1975, 47 y.o.   MRN: [id number]   Kelly Byrd, is a 48 y.o. female  QMG:[id number]  [id number]  DOB - 1976-02-14  Chief Complaint  Patient presents with   Back Pain   Flank Pain       Subjective:   Kelly Byrd is a 47 y.o. female here today for diabetes check and trouble with pain in R flank.  She started having some pain and pressure with urination about 2 weeks ago.  Then it seemed to subside as she got sick with a cold/URI.  Now in the last few days she has started having pain in the R flank are that is radiating toward her groin at times.  She had total hysterectomy and denies pelvic pain or vaginal discharge.  No N/V/D.  No known fever.    She did not take her BP meds today.  She is on 1.5mg  trulicity pens.    No problems updated.  ALLERGIES: Allergies  Allergen Reactions   Ace Inhibitors Swelling    Throat and face   Angiotensin Receptor Blockers Anaphylaxis   Lisinopril Other (See Comments)    Swelling--caused the pt to stay in the hospital   Peanuts [Peanut Oil] Other (See Comments)    Swelling--caused the pt to stay in the hospital    PAST MEDICAL HISTORY: Past Medical History:  Diagnosis Date   Asthma    Back pain    Chronic kidney disease    frequent infrections   Depression    Dislocation of hip (HCC)    right hip   GERD (gastroesophageal reflux disease)    Hypertension    Migraines    Pre-diabetes    Sickle cell trait (HCC)     MEDICATIONS AT HOME: Prior to Admission medications   Medication Sig Start Date End Date Taking? Authorizing Provider  albuterol (PROVENTIL) (2.5 MG/3ML) 0.083% nebulizer solution Take 3 mLs (2.5 mg total) by nebulization every 6 (six) hours as needed for wheezing or shortness of breath (wheezing). 08/25/22  Yes Anders Simmonds, PA-C  allopurinol (ZYLOPRIM) 100 MG tablet Take 3 tablets (300 mg total) by mouth daily. Start with 100 mg daily for one week then  increase to 200 mg (two tablets daily for one week) and then increase to 300 mg( 3 tablets) daily and continue this dose daily as tolerated 11/25/22  Yes Newlin, Enobong, MD  amLODipine (NORVASC) 10 MG tablet Take 1 tablet (10 mg total) by mouth daily. 11/25/22  Yes Hoy Register, MD  atorvastatin (LIPITOR) 20 MG tablet Take 1 tablet (20 mg total) by mouth daily. 11/25/22  Yes Hoy Register, MD  baclofen (LIORESAL) 10 MG tablet Take 10 mg by mouth in the morning and at bedtime.  07/16/19  Yes [provider]  Blood Glucose Monitoring Suppl (TRUE METRIX METER) DEVI 1 each by Does not apply route 3 (three) times daily. 04/15/21  Yes Hoy Register, MD  cetirizine (ZYRTEC) 10 MG tablet Take 1 tablet by mouth once daily 04/17/21  Yes Newlin, Enobong, MD  clonazePAM (KLONOPIN) 0.5 MG tablet Take 0.25 mg by mouth daily. 05/21/21  Yes [provider]  dapagliflozin propanediol (FARXIGA) 5 MG TABS tablet TAKE 1 TABLET BY MOUTH ONCE DAILY BEFORE BREAKFAST. DISCONTINUE METFORMIN. 11/25/22  Yes Hoy Register, MD  diclofenac Sodium (VOLTAREN) 1 % GEL Apply 1 application topically 4 (four) times daily as needed (pain.).  07/16/19  Yes [provider]  doxycycline (VIBRA-TABS) 100 MG  tablet Take 1 tablet (100 mg total) by mouth 2 (two) times daily. 01/27/23  Yes Roshard Rezabek M, PA-C  Dulaglutide (TRULICITY) 3 MG/0.5ML SOAJ Inject 3 mg as directed once a week. 01/27/23  Yes Georgian Co M, PA-C  EPINEPHrine 0.3 mg/0.3 mL IJ SOAJ injection Inject 0.3 mg into the muscle as needed for anaphylaxis. 11/25/22  Yes Hoy Register, MD  fluconazole (DIFLUCAN) 150 MG tablet Take 1 tablet (150 mg total) by mouth once for 1 dose. 01/27/23 01/28/23 Yes Tyree Vandruff, Marzella Schlein, PA-C  fluticasone (FLONASE) 50 MCG/ACT nasal spray Use 2 spray(s) in each nostril once daily 02/02/21  Yes Newlin, Enobong, MD  glucose blood (TRUE METRIX BLOOD GLUCOSE TEST) test strip 1 each by Other route 3 (three) times daily.  04/15/21  Yes Hoy Register, MD  Misc. Devices MISC Rolling walker with seat 02/02/21  Yes Newlin, Enobong, MD  mometasone-formoterol (DULERA) 200-5 MCG/ACT AERO INHALE 2 PUFFS BY MOUTH IN THE MORNING AND AT BEDTIME 11/25/22  Yes Newlin, Enobong, MD  oxyCODONE-acetaminophen (PERCOCET) 10-325 MG tablet Take 1 tablet by mouth 3 (three) times daily as needed. 10/15/21  Yes [provider]  Potassium Chloride ER 20 MEQ TBCR Take 1 tablet (20 mEq total) by mouth daily. 11/25/22  Yes Hoy Register, MD  sertraline (ZOLOFT) 100 MG tablet Take 100 mg by mouth daily. 10/08/19  Yes [provider]  topiramate (TOPAMAX) 50 MG tablet Take 1 tablet (50 mg total) by mouth daily. 11/25/22  Yes Hoy Register, MD  traZODone (DESYREL) 100 MG tablet Take 1 tablet (100 mg total) by mouth at bedtime. 10/14/17  Yes Starkes-Perry, Juel Burrow, FNP  TRUEplus Lancets 28G MISC 1 each by Does not apply route 3 (three) times daily. 04/15/21  Yes Newlin, Enobong, MD  VENTOLIN HFA 108 (90 Base) MCG/ACT inhaler INHALE 2 PUFFS BY MOUTH EVERY 6 HOURS AS NEEDED FOR WHEEZING OR SHORTNESS OF BREATH 11/25/22  Yes Hoy Register, MD  famotidine (PEPCID) 20 MG tablet Take 1 tablet (20 mg total) by mouth 2 (two) times daily. 03/31/22 04/30/22  Viviano Simas, FNP    ROS: Neg HEENT Neg resp Neg cardiac Neg GI Neg MS Neg psych Neg neuro  Objective:   Vitals:   01/27/23 1356  BP: (!) 164/106  Pulse: 86  SpO2: 92%  Weight: 248 lb 12.8 oz (112.9 kg)   Exam General appearance : Awake, alert, not in any distress. Speech Clear. Not toxic looking HEENT: Atraumatic and Normocephalic Neck: Supple, no JVD. No cervical lymphadenopathy.  Chest: Good air entry bilaterally, CTAB.  No rales/rhonchi/wheezing CVS: S1 S2 regular, no murmurs.  Abdomen: Bowel sounds present, Non tender and not distended with no gaurding, rigidity or rebound. Mild CVA TTP Extremities: B/L Lower Ext shows no edema, both legs are warm to  touch Neurology: Awake alert, and oriented X 3, CN II-XII intact, Non focal Skin: No Rash  Data Review Lab Results  Component Value Date   HGBA1C 7.4 (A) 01/27/2023   HGBA1C 7.3 (A) 08/25/2022   HGBA1C 6.8 (H) 07/14/2021    Assessment & Plan   1. Type 2 diabetes mellitus with hyperglycemia, unspecified whether long term insulin use (HCC) Not at goal-will increase trulicity dose - POCT URINALYSIS DIP (CLINITEK) - Glucose (CBG) - HgB A1c - Dulaglutide (TRULICITY) 3 MG/0.5ML SOAJ; Inject 3 mg as directed once a week.  Dispense: 2 mL; Refill: 4 - Comprehensive metabolic panel  2. Acute cystitis with hematuria Concern for pyelo.  Increase water intake.  To ED  if worsens.  Patient verbalized understanding - Urine Culture - Cervicovaginal ancillary only - fluconazole (DIFLUCAN) 150 MG tablet; Take 1 tablet (150 mg total) by mouth once for 1 dose.  Dispense: 1 tablet; Refill: 0 - cefTRIAXone (ROCEPHIN) injection 500 mg - CBC with Differential - DG Abd 1 View; Future  3. Essential hypertension Take amlodipine!!! - Comprehensive metabolic panel  4. Right flank pain - Urine Culture - Cervicovaginal ancillary only - cefTRIAXone (ROCEPHIN) injection 500 mg - CBC with Differential - Comprehensive metabolic panel - DG Abd 1 View; Future  Increase water intake!!!!  Return in about 3 months (around 04/29/2023) for PCP for chronic conditions.  The patient was given clear instructions to go to ER or return to medical center if symptoms don't improve, worsen or new problems develop. The patient verbalized understanding. The patient was told to call to get lab results if they haven't heard anything in the next week.      Georgian Co, PA-C [facility] [place], [place] [phone]   01/27/2023, 2:24 PM

## 2023-01-27 NOTE — Patient Instructions (Addendum)
Please drink at least 80 ounces water daily.  If you worsen at all, go to the local emergency department   Pyelonephritis, Adult  Pyelonephritis is an infection in the kidney. The kidneys are the parts of the body that help clean the blood. They move waste out of the blood and into the pee (urine). This infection can happen fast, or it can last for a long time. In most cases, it clears up with treatment and does not cause other problems. What are the causes? This infection may be caused by germs (bacteria). The germs may go: From your bladder up into your kidney. This may happen after you have a bladder infection. From your blood into your kidney. What increases the risk? You are more likely to get this condition if: You are pregnant. You are older. You have: Diabetes. Prostatitis. This is irritation and swelling of the prostate gland. Kidney stones or bladder stones. Other problems with your kidney or the parts of your body that carry pee from the kidneys to the bladder (ureters). Cancer. You have a soft tube called a catheter in your bladder. You are sexually active. You use a medicine that kills sperm (spermicide). This may be used to prevent pregnancy. You have had a urinary tract infection (UTI) before. What are the signs or symptoms? Peeing often. Feeling the need to pee right away. A burning or stinging feeling when you pee. Pain in your belly, back, or side. Fever or chills. Vomiting or feeling like you may vomit. Dark pee or blood in your pee. How is this treated? You may be given antibiotics to take. You will need to drink lots of fluids. If the infection is bad, you may need to stay in the hospital. You may be given antibiotics and fluids through an IV. In some cases, other treatments may be needed. Follow these instructions at home: Eating and drinking Drink enough fluid to keep your pee pale yellow. Avoid caffeine, tea, and drinks that are bubbly  (carbonated). General instructions Take over-the-counter and prescription medicines as told by your doctor. Finish your antibiotics even if you start to feel better. Pee (urinate) often. Do not hold in your pee for long periods of time. Pee before and after you have sex. If you are female, wipe from front to back after you poop (have a bowel movement). Use each tissue only once. Keep all follow-up visits. Your doctor will want to make sure that you are getting better. Contact a doctor if: You do not feel better after 2 days. Your symptoms get worse. You have a fever or chills. You cannot take your medicine. Get help right away if: You vomit each time that you eat or drink. You have very bad pain in your back or side. You are very weak, or you faint. This information is not intended to replace advice given to you by your health care provider. Make sure you discuss any questions you have with your health care provider. Document Revised: 10/05/2021 Document Reviewed: 10/05/2021 Elsevier Patient Education  2024 [employer].

## 2023-01-28 LAB — CBC WITH DIFFERENTIAL/PLATELET
Basophils Absolute: 0 10*3/uL (ref 0.0–0.2)
Basos: 0 %
EOS (ABSOLUTE): 0.4 10*3/uL (ref 0.0–0.4)
Eos: 3 %
Hematocrit: 48.1 % — ABNORMAL HIGH (ref 34.0–46.6)
Hemoglobin: 15.6 g/dL (ref 11.1–15.9)
Immature Grans (Abs): 0 10*3/uL (ref 0.0–0.1)
Immature Granulocytes: 0 %
Lymphocytes Absolute: 3 10*3/uL (ref 0.7–3.1)
Lymphs: 28 %
MCH: 26.2 pg — ABNORMAL LOW (ref 26.6–33.0)
MCHC: 32.4 g/dL (ref 31.5–35.7)
MCV: 81 fL (ref 79–97)
Monocytes Absolute: 0.5 10*3/uL (ref 0.1–0.9)
Monocytes: 5 %
Neutrophils Absolute: 6.8 10*3/uL (ref 1.4–7.0)
Neutrophils: 64 %
Platelets: 361 10*3/uL (ref 150–450)
RBC: 5.95 x10E6/uL — ABNORMAL HIGH (ref 3.77–5.28)
RDW: 15.5 % — ABNORMAL HIGH (ref 11.7–15.4)
WBC: 10.7 10*3/uL (ref 3.4–10.8)

## 2023-01-28 LAB — COMPREHENSIVE METABOLIC PANEL
ALT: 19 [IU]/L (ref 0–32)
AST: 14 [IU]/L (ref 0–40)
Albumin: 4 g/dL (ref 3.9–4.9)
Alkaline Phosphatase: 67 [IU]/L (ref 44–121)
BUN/Creatinine Ratio: 12 (ref 9–23)
BUN: 9 mg/dL (ref 6–24)
Bilirubin Total: 0.3 mg/dL (ref 0.0–1.2)
CO2: 26 mmol/L (ref 20–29)
Calcium: 9.6 mg/dL (ref 8.7–10.2)
Chloride: 102 mmol/L (ref 96–106)
Creatinine, Ser: 0.76 mg/dL (ref 0.57–1.00)
Globulin, Total: 2.7 g/dL (ref 1.5–4.5)
Glucose: 178 mg/dL — ABNORMAL HIGH (ref 70–99)
Potassium: 3.9 mmol/L (ref 3.5–5.2)
Sodium: 141 mmol/L (ref 134–144)
Total Protein: 6.7 g/dL (ref 6.0–8.5)
eGFR: 98 mL/min/{1.73_m2} (ref >59)

## 2023-01-28 LAB — CERVICOVAGINAL ANCILLARY ONLY
Bacterial Vaginitis (gardnerella): NEGATIVE
Candida Glabrata: NEGATIVE
Candida Vaginitis: NEGATIVE
Chlamydia: NEGATIVE
Comment: NEGATIVE
Comment: NEGATIVE
Comment: NEGATIVE
Comment: NEGATIVE
Comment: NEGATIVE
Comment: NORMAL
Neisseria Gonorrhea: NEGATIVE
Trichomonas: NEGATIVE

## 2023-01-31 ENCOUNTER — Other Ambulatory Visit: Payer: Self-pay

## 2023-01-31 ENCOUNTER — Telehealth: Payer: Self-pay

## 2023-01-31 NOTE — Telephone Encounter (Signed)
Pt was called and is aware of results, DOB was confirmed.  ?

## 2023-01-31 NOTE — Telephone Encounter (Signed)
-----   Message from Georgian Co sent at 01/31/2023 11:29 AM EST ----- Please call patient. Her urine culture did show bacteria and she should complete the antibiotic and increase water intake as discussed. Vaginal swabs were normal. Thanks, Georgian Co, PA-C

## 2023-02-01 LAB — URINE CULTURE

## 2023-02-28 ENCOUNTER — Other Ambulatory Visit (HOSPITAL_COMMUNITY): Payer: Self-pay

## 2023-02-28 ENCOUNTER — Encounter (HOSPITAL_COMMUNITY): Payer: Self-pay

## 2023-03-02 ENCOUNTER — Other Ambulatory Visit: Payer: Self-pay

## 2023-04-06 ENCOUNTER — Ambulatory Visit: Payer: MEDICAID | Admitting: Family Medicine

## 2023-05-26 ENCOUNTER — Encounter: Payer: Self-pay | Admitting: Internal Medicine

## 2023-05-26 ENCOUNTER — Other Ambulatory Visit: Payer: Self-pay

## 2023-05-26 ENCOUNTER — Telehealth: Payer: MEDICAID | Admitting: Internal Medicine

## 2023-05-26 ENCOUNTER — Ambulatory Visit: Payer: Self-pay | Admitting: Family Medicine

## 2023-05-26 DIAGNOSIS — Z20828 Contact with and (suspected) exposure to other viral communicable diseases: Secondary | ICD-10-CM

## 2023-05-26 DIAGNOSIS — J4531 Mild persistent asthma with (acute) exacerbation: Secondary | ICD-10-CM | POA: Diagnosis not present

## 2023-05-26 DIAGNOSIS — Z760 Encounter for issue of repeat prescription: Secondary | ICD-10-CM | POA: Diagnosis not present

## 2023-05-26 DIAGNOSIS — J22 Unspecified acute lower respiratory infection: Secondary | ICD-10-CM | POA: Diagnosis not present

## 2023-05-26 MED ORDER — PREDNISONE 20 MG PO TABS
ORAL_TABLET | ORAL | 0 refills | Status: AC
  Filled 2023-05-26: qty 5, 6d supply, fill #0

## 2023-05-26 MED ORDER — OSELTAMIVIR PHOSPHATE 75 MG PO CAPS
75.0000 mg | ORAL_CAPSULE | Freq: Two times a day (BID) | ORAL | 0 refills | Status: DC
  Filled 2023-05-26: qty 10, 5d supply, fill #0

## 2023-05-26 MED ORDER — DULERA 200-5 MCG/ACT IN AERO
INHALATION_SPRAY | RESPIRATORY_TRACT | 2 refills | Status: DC
  Filled 2023-05-26: qty 13, 30d supply, fill #0

## 2023-05-26 MED ORDER — BENZONATATE 100 MG PO CAPS
100.0000 mg | ORAL_CAPSULE | Freq: Three times a day (TID) | ORAL | 0 refills | Status: DC | PRN
  Filled 2023-05-26: qty 20, 7d supply, fill #0

## 2023-05-26 MED ORDER — TRULICITY 3 MG/0.5ML ~~LOC~~ SOAJ
3.0000 mg | SUBCUTANEOUS | 1 refills | Status: DC
  Filled 2023-05-26: qty 2, 28d supply, fill #0

## 2023-05-26 MED ORDER — VENTOLIN HFA 108 (90 BASE) MCG/ACT IN AERS
INHALATION_SPRAY | RESPIRATORY_TRACT | 1 refills | Status: DC
  Filled 2023-05-26: qty 18, 25d supply, fill #0

## 2023-05-26 NOTE — Progress Notes (Signed)
 Patient ID: Kelly Byrd, female   DOB: 03-19-1976, 48 y.o.   MRN: [id number] Virtual Visit via Video Note  I connected with Kelly Byrd on 05/26/2023 at 1:42 PM by a video enabled telemedicine application and verified that I am speaking with the correct person using two identifiers.  Location: Patient: is sitting in her parked car outside of her house Provider: Office   I discussed the limitations of evaluation and management by telemedicine and the availability of in person appointments. The patient expressed understanding and agreed to proceed.  History of Present Illness: 48 y.o. year old female with  a history of hypertension, asthma, migraine, anemia (secondary to menorrhagia from fibroids), chronic low back pain from lumbar degenerative disc disease, slipped capital femoral epiphysis, type 2 diabetes mellitus.  PCP is Dr. Alvis Lemmings.  This is an UC visit.  Discussed the use of AI scribe software for clinical note transcription with the patient, who gave verbal consent to proceed.  History of Present Illness   The patient, with a history of asthma, presents with flu-like symptoms after close contact with her grandchildren who tested positive for the flu 6 days ago. She reports onset of symptoms on Monday 4 days ago, including coughing, chest congestion, fever, chills, and body aches. The coughing is so severe that it causes rib pain and urinary incontinence. She has been self-medicating with Theraflu, Coricidin, Desinex cough syrup, Tylenol, and ibuprofen. Despite using her albuterol inhaler frequently, she still feels chest congestion and experiences shortness of breath when moving around. She also reports a sore throat earlier in the week, which has since improved with Ricola lozenges. She did not receive a flu shot this year.      Request refill on Trulicity which she takes for diabetes.  Outpatient Encounter Medications as of 05/26/2023  Medication Sig   albuterol  (PROVENTIL) (2.5 MG/3ML) 0.083% nebulizer solution Take 3 mLs (2.5 mg total) by nebulization every 6 (six) hours as needed for wheezing or shortness of breath (wheezing).   allopurinol (ZYLOPRIM) 100 MG tablet Take 3 tablets (300 mg total) by mouth daily. Start with 100 mg daily for one week then increase to 200 mg (two tablets daily for one week) and then increase to 300 mg( 3 tablets) daily and continue this dose daily as tolerated   amLODipine (NORVASC) 10 MG tablet Take 1 tablet (10 mg total) by mouth daily.   atorvastatin (LIPITOR) 20 MG tablet Take 1 tablet (20 mg total) by mouth daily.   baclofen (LIORESAL) 10 MG tablet Take 10 mg by mouth in the morning and at bedtime.    Blood Glucose Monitoring Suppl (TRUE METRIX METER) DEVI 1 each by Does not apply route 3 (three) times daily.   cetirizine (ZYRTEC) 10 MG tablet Take 1 tablet by mouth once daily   clonazePAM (KLONOPIN) 0.5 MG tablet Take 0.25 mg by mouth daily.   dapagliflozin propanediol (FARXIGA) 5 MG TABS tablet TAKE 1 TABLET BY MOUTH ONCE DAILY BEFORE BREAKFAST. DISCONTINUE METFORMIN.   diclofenac Sodium (VOLTAREN) 1 % GEL Apply 1 application topically 4 (four) times daily as needed (pain.).    doxycycline (VIBRA-TABS) 100 MG tablet Take 1 tablet (100 mg total) by mouth 2 (two) times daily.   Dulaglutide (TRULICITY) 3 MG/0.5ML SOAJ Inject 3 mg as directed once a week.   EPINEPHrine 0.3 mg/0.3 mL IJ SOAJ injection Inject 0.3 mg into the muscle as needed for anaphylaxis.   famotidine (PEPCID) 20 MG tablet Take 1 tablet (20 mg  total) by mouth 2 (two) times daily.   fluticasone (FLONASE) 50 MCG/ACT nasal spray Use 2 spray(s) in each nostril once daily   glucose blood (TRUE METRIX BLOOD GLUCOSE TEST) test strip 1 each by Other route 3 (three) times daily.   Misc. Devices MISC Rolling walker with seat   mometasone-formoterol (DULERA) 200-5 MCG/ACT AERO INHALE 2 PUFFS BY MOUTH IN THE MORNING AND AT BEDTIME   oxyCODONE-acetaminophen  (PERCOCET) 10-325 MG tablet Take 1 tablet by mouth 3 (three) times daily as needed.   Potassium Chloride ER 20 MEQ TBCR Take 1 tablet (20 mEq total) by mouth daily.   sertraline (ZOLOFT) 100 MG tablet Take 100 mg by mouth daily.   topiramate (TOPAMAX) 50 MG tablet Take 1 tablet (50 mg total) by mouth daily.   traZODone (DESYREL) 100 MG tablet Take 1 tablet (100 mg total) by mouth at bedtime.   TRUEplus Lancets 28G MISC 1 each by Does not apply route 3 (three) times daily.   VENTOLIN HFA 108 (90 Base) MCG/ACT inhaler INHALE 2 PUFFS BY MOUTH EVERY 6 HOURS AS NEEDED FOR WHEEZING OR SHORTNESS OF BREATH   No facility-administered encounter medications on file as of 05/26/2023.      Observations/Objective: Middle-age obese African-American female sitting in her car in NAD.  She has audible congestion.  She is able to speak in complete sentences.  Assessment and Plan: 1. Acute respiratory infection (Primary) Patient presenting with acute respiratory infection most likely due to the flu as she has had exposure to her grandchildren who were recently diagnosed.  It is causing flare of her asthma.  I recommend putting her on Tamiflu for 5 days and short course of prednisone.  Refill given on albuterol inhaler and Dulera which she has been out of.  Continue Tylenol as needed for any fever.  Given Tessalon Perles to use as needed for the cough.  Advised patient to be seen in the emergency room if symptoms do not improve or get worse. - oseltamivir (TAMIFLU) 75 MG capsule; Take 1 capsule (75 mg total) by mouth 2 (two) times daily.  Dispense: 10 capsule; Refill: 0 - benzonatate (TESSALON PERLES) 100 MG capsule; Take 1 capsule (100 mg total) by mouth 3 (three) times daily as needed.  Dispense: 20 capsule; Refill: 0  2. Exposure to influenza See #1 above. - mometasone-formoterol (DULERA) 200-5 MCG/ACT AERO; INHALE 2 PUFFS BY MOUTH IN THE MORNING AND AT BEDTIME  Dispense: 13 g; Refill: 2  3. Mild persistent  asthma with exacerbation See #1 above. - mometasone-formoterol (DULERA) 200-5 MCG/ACT AERO; INHALE 2 PUFFS BY MOUTH IN THE MORNING AND AT BEDTIME  Dispense: 13 g; Refill: 2 - VENTOLIN HFA 108 (90 Base) MCG/ACT inhaler; INHALE 2 PUFFS BY MOUTH EVERY 6 HOURS AS NEEDED FOR WHEEZING OR SHORTNESS OF BREATH  Dispense: 18 g; Refill: 1 - predniSONE (DELTASONE) 20 MG tablet; 1 tab PO daily x 4 days then 1/2 tab x 2 days  Dispense: 5 tablet; Refill: 0  4. Medication refill Refill given on Trulicity.  Advised to follow-up with her PCP. - Dulaglutide (TRULICITY) 3 MG/0.5ML SOAJ; Inject 3 mg as directed once a week.  Dispense: 2 mL; Refill: 1   Follow Up Instructions: Follow-up with her PCP in 4 to 6 weeks for chronic disease management.   I discussed the assessment and treatment plan with the patient. The patient was provided an opportunity to ask questions and all were answered. The patient agreed with the plan and demonstrated an  understanding of the instructions.   The patient was advised to call back or seek an in-person evaluation if the symptoms worsen or if the condition fails to improve as anticipated.  I spent 13 minutes dedicated to the care of this patient on the date of this encounter to include previsit review of of her chart, face-to-face time with patient discussing diagnosis and management and post visit entering of orders.  This note has been created with Education officer, environmental. Any transcriptional errors are unintentional.  Jonah Blue, MD

## 2023-05-26 NOTE — Telephone Encounter (Signed)
 Copied from CRM [id number]. Topic: Clinical - Red Word Triage >> May 26, 2023 11:25 AM Kelly Byrd wrote: Red Word that prompted transfer to Nurse Triage: hurts to breathe/chest hurts/ coughing/raspy breathing-like a whistle blowing Grand kids had flu  Chief Complaint: cough- flu exposure Symptoms: chest soreness, runny nose, wheezing, SOB Frequency: Mon Pertinent Negatives: Patient denies fever Disposition: [] ED /[] Urgent Care (no appt availability in office) / [x] Appointment(In office/virtual)/ []  [facility]/ [] Home Care/ [] Refused Recommended Disposition /[] [place] [facility]/ []  Follow-up with PCP Additional Notes:   Reason for Disposition  Patient is HIGH RISK (e.g., age > 64 years, pregnant, HIV+, or chronic medical condition)  Answer Assessment - Initial Assessment Questions 1. WORST SYMPTOM: "What is your worst symptom?" (e.g., cough, runny nose, muscle aches, headache, sore throat, fever)      Cough, runny nose, barky cough  Phlegm yellow to white color 2. ONSET: "When did your flu symptoms start?"      Monday  3. COUGH: "How bad is the cough?"       Losing breath and peeing on self  4. RESPIRATORY DISTRESS: "Describe your breathing."      moderate 5. FEVER: "Do you have a fever?" If Yes, ask: "What is your temperature, how was it measured, and when did it start?"     no 6. EXPOSURE: "Were you exposed to someone with influenza?"       Hand children  7. FLU VACCINE: "Did you get a flu shot this year?"     N/a 8. HIGH RISK DISEASE: "Do you have any chronic medical problems?" (e.g., heart or lung disease, asthma, weak immune system, or other HIGH RISK conditions)     Asthma diabetes 9. PREGNANCY: "Is there any chance you are pregnant?" "When was your last menstrual period?"     N/a 10. OTHER SYMPTOMS: "Do you have any other symptoms?"  (e.g., runny nose, muscle aches, headache, sore throat)       Chest sore, raspy, headache  Protocols used: Influenza (Flu) -  [facility]

## 2023-06-06 ENCOUNTER — Other Ambulatory Visit: Payer: Self-pay

## 2023-09-22 ENCOUNTER — Emergency Department (HOSPITAL_COMMUNITY): Payer: MEDICAID

## 2023-09-22 ENCOUNTER — Emergency Department (HOSPITAL_COMMUNITY)
Admission: EM | Admit: 2023-09-22 | Discharge: 2023-09-22 | Disposition: A | Discharge status: Home / Self Care | Payer: MEDICAID | Attending: Student | Admitting: Student

## 2023-09-22 ENCOUNTER — Other Ambulatory Visit: Payer: Self-pay

## 2023-09-22 ENCOUNTER — Encounter (HOSPITAL_COMMUNITY): Payer: Self-pay | Admitting: Emergency Medicine

## 2023-09-22 DIAGNOSIS — N182 Chronic kidney disease, stage 2 (mild): Secondary | ICD-10-CM | POA: Diagnosis not present

## 2023-09-22 DIAGNOSIS — D72829 Elevated white blood cell count, unspecified: Secondary | ICD-10-CM | POA: Insufficient documentation

## 2023-09-22 DIAGNOSIS — Z79899 Other long term (current) drug therapy: Secondary | ICD-10-CM | POA: Insufficient documentation

## 2023-09-22 DIAGNOSIS — Z91010 Allergy to peanuts: Secondary | ICD-10-CM | POA: Insufficient documentation

## 2023-09-22 DIAGNOSIS — J45909 Unspecified asthma, uncomplicated: Secondary | ICD-10-CM | POA: Insufficient documentation

## 2023-09-22 DIAGNOSIS — R718 Other abnormality of red blood cells: Secondary | ICD-10-CM | POA: Insufficient documentation

## 2023-09-22 DIAGNOSIS — R110 Nausea: Secondary | ICD-10-CM | POA: Insufficient documentation

## 2023-09-22 DIAGNOSIS — E1122 Type 2 diabetes mellitus with diabetic chronic kidney disease: Secondary | ICD-10-CM | POA: Insufficient documentation

## 2023-09-22 DIAGNOSIS — R109 Unspecified abdominal pain: Secondary | ICD-10-CM | POA: Diagnosis present

## 2023-09-22 DIAGNOSIS — I129 Hypertensive chronic kidney disease with stage 1 through stage 4 chronic kidney disease, or unspecified chronic kidney disease: Secondary | ICD-10-CM | POA: Insufficient documentation

## 2023-09-22 DIAGNOSIS — E876 Hypokalemia: Secondary | ICD-10-CM | POA: Insufficient documentation

## 2023-09-22 DIAGNOSIS — F17210 Nicotine dependence, cigarettes, uncomplicated: Secondary | ICD-10-CM | POA: Insufficient documentation

## 2023-09-22 LAB — CBC
HCT: 50.5 % — ABNORMAL HIGH (ref 36.0–46.0)
Hemoglobin: 17 g/dL — ABNORMAL HIGH (ref 12.0–15.0)
MCH: 27.1 pg (ref 26.0–34.0)
MCHC: 33.7 g/dL (ref 30.0–36.0)
MCV: 80.4 fL (ref 80.0–100.0)
Platelets: 300 10*3/uL (ref 150–400)
RBC: 6.28 MIL/uL — ABNORMAL HIGH (ref 3.87–5.11)
RDW: 15.2 % (ref 11.5–15.5)
WBC: 14 10*3/uL — ABNORMAL HIGH (ref 4.0–10.5)
nRBC: 0 % (ref 0.0–0.2)

## 2023-09-22 LAB — URINALYSIS, ROUTINE W REFLEX MICROSCOPIC
Bacteria, UA: NONE SEEN
Bilirubin Urine: NEGATIVE
Glucose, UA: NEGATIVE mg/dL
Ketones, ur: NEGATIVE mg/dL
Leukocytes,Ua: NEGATIVE
Nitrite: NEGATIVE
Protein, ur: 100 mg/dL — AB
Specific Gravity, Urine: 1.018 (ref 1.005–1.030)
pH: 5 (ref 5.0–8.0)

## 2023-09-22 LAB — BASIC METABOLIC PANEL WITH GFR
Anion gap: 11 (ref 5–15)
BUN: 10 mg/dL (ref 6–20)
CO2: 23 mmol/L (ref 22–32)
Calcium: 9.2 mg/dL (ref 8.9–10.3)
Chloride: 104 mmol/L (ref 98–111)
Creatinine, Ser: 0.62 mg/dL (ref 0.44–1.00)
GFR, Estimated: >60 mL/min (ref >60)
Glucose, Bld: 149 mg/dL — ABNORMAL HIGH (ref 70–99)
Potassium: 3.2 mmol/L — ABNORMAL LOW (ref 3.5–5.1)
Sodium: 138 mmol/L (ref 135–145)

## 2023-09-22 MED ORDER — ONDANSETRON HCL 4 MG/2ML IJ SOLN
4.0000 mg | Freq: Once | INTRAMUSCULAR | Status: AC
  Administered 2023-09-22: 4 mg via INTRAVENOUS
  Filled 2023-09-22: qty 2

## 2023-09-22 MED ORDER — OXYCODONE HCL 5 MG PO TABS: 5.0000 mg | ORAL_TABLET | Freq: Four times a day (QID) | ORAL | 0 refills | Status: AC | PRN

## 2023-09-22 MED ORDER — ACETAMINOPHEN 500 MG PO TABS: 1000.0000 mg | ORAL_TABLET | Freq: Three times a day (TID) | ORAL | 0 refills | Status: AC

## 2023-09-22 MED ORDER — HYDROCODONE-ACETAMINOPHEN 5-325 MG PO TABS
2.0000 | ORAL_TABLET | Freq: Once | ORAL | Status: AC
  Administered 2023-09-22: 2 via ORAL
  Filled 2023-09-22: qty 2

## 2023-09-22 MED ORDER — NAPROXEN 500 MG PO TABS: 500.0000 mg | ORAL_TABLET | Freq: Two times a day (BID) | ORAL | 0 refills | Status: AC

## 2023-09-22 MED ORDER — KETOROLAC TROMETHAMINE 15 MG/ML IJ SOLN
15.0000 mg | Freq: Once | INTRAMUSCULAR | Status: AC
  Administered 2023-09-22: 15 mg via INTRAVENOUS
  Filled 2023-09-22: qty 1

## 2023-09-22 MED ORDER — MORPHINE SULFATE (PF) 4 MG/ML IV SOLN
4.0000 mg | Freq: Once | INTRAVENOUS | Status: AC
  Administered 2023-09-22: 4 mg via INTRAVENOUS
  Filled 2023-09-22: qty 1

## 2023-09-22 MED ORDER — MORPHINE SULFATE (PF) 4 MG/ML IV SOLN
6.0000 mg | Freq: Once | INTRAVENOUS | Status: AC
  Administered 2023-09-22: 6 mg via INTRAVENOUS
  Filled 2023-09-22: qty 2

## 2023-09-22 NOTE — ED Notes (Signed)
 ED Provider at bedside.

## 2023-09-22 NOTE — ED Triage Notes (Signed)
 C/o right side flank pain that started yesterday that has progressively gotten worse. Pt unable to sleep d/t pain. Denies any urinary symptoms pt denies fever/vomiting. Does endorse nausea

## 2023-09-22 NOTE — Discharge Instructions (Addendum)
 For pain:  - Acetaminophen 1000 mg three times daily (every 8 hours) - Naproxen 2 times daily (every 12 hours) - oxycodone for breakthrough pain only

## 2023-09-22 NOTE — ED Notes (Signed)
 Patient transported to CT

## 2023-09-23 NOTE — ED Provider Notes (Signed)
  EMERGENCY DEPARTMENT AT [facility] Provider Note  CSN: [id number] Arrival date & time: 09/22/23 1849  Chief Complaint(s) Flank Pain  HPI Kelly Byrd is a 48 y.o. female with PMH asthma, depression, HTN, sickle cell trait who presents emergency room for evaluation of flank pain.  States that right-sided flank pain started yesterday and progressively worsened over the last 24 hours.  Denies associated fever, chest pain, shortness of breath, dysuria, vaginal discharge headache or other systemic symptoms.  Does endorse nausea.   Past Medical History Past Medical History:  Diagnosis Date   Asthma    Back pain    Chronic kidney disease    frequent infrections   Depression    Dislocation of hip (HCC)    right hip   GERD (gastroesophageal reflux disease)    Hypertension    Migraines    Pre-diabetes    Sickle cell trait (HCC)    Patient Active Problem List   Diagnosis Date Noted   Anaphylactic reaction 12/29/2021   Obesity, Class III, BMI 40-49.9 (morbid obesity) 12/29/2021   Uncontrolled type 2 diabetes mellitus with hyperglycemia, without long-term current use of insulin  (HCC) 12/29/2021   CKD (chronic kidney disease) stage 2, GFR 60-89 ml/min    Rhabdomyolysis 10/16/2020   Syncope and collapse 10/16/2020   S/P abdominal supracervical subtotal hysterectomy 10/23/2019   Abdominal adhesions    Abnormal uterine bleeding (AUB) 10/11/2019   Intramural leiomyoma of uterus 10/11/2019   Angioedema 06/26/2019   Facial swelling    Allergic reaction 06/25/2019   Hypokalemia 06/25/2019   Abdominal pain 06/25/2019   Major depressive disorder, 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 Authorizing Provider  acetaminophen  (TYLENOL ) 500 MG tablet Take 2 tablets (1,000 mg total) by mouth every 8 (eight) hours. 09/22/23 10/22/23 Yes Kunio Cummiskey, MD  albuterol  (PROVENTIL ) (2.5 MG/3ML) 0.083% nebulizer solution Take 3 mLs (2.5 mg total) by nebulization every 6 (six) hours as needed for wheezing or shortness of breath (wheezing). 08/25/22  Yes Danton Jon HERO, PA-C  allopurinol  (ZYLOPRIM ) 100 MG tablet Take 3 tablets (300 mg total) by mouth daily. Start with 100 mg daily for one week then increase to 200 mg (two tablets daily for one week) and then increase to 300 mg( 3 tablets) daily and continue this dose daily as tolerated 11/25/22  Yes Newlin, Enobong, MD  amLODipine  (NORVASC ) 10 MG tablet Take 1 tablet (10 mg total) by mouth daily. 11/25/22  Yes Newlin, Enobong, MD  atorvastatin  (LIPITOR) 20 MG tablet Take 1 tablet (20 mg total) by mouth daily. 11/25/22  Yes Newlin, Enobong, MD  baclofen  (LIORESAL ) 10 MG tablet Take 10 mg by mouth in the morning and at bedtime.  07/16/19  Yes [provider]  cetirizine  (ZYRTEC ) 10 MG tablet Take 1 tablet by mouth once daily 04/17/21  Yes Newlin, Enobong, MD  dapagliflozin  propanediol (FARXIGA ) 5 MG TABS tablet TAKE 1 TABLET BY MOUTH ONCE DAILY BEFORE BREAKFAST. DISCONTINUE METFORMIN . 11/25/22  Yes Newlin, Enobong, MD  diclofenac  Sodium (VOLTAREN ) 1 % GEL Apply  1 application topically 4 (four) times daily as needed (pain.).  07/16/19  Yes [provider]  Dulaglutide  (TRULICITY ) 3 MG/0.5ML SOAJ Inject 3 mg as directed once a week. 05/26/23  Yes Vicci Barnie NOVAK, MD  EPINEPHrine  0.3 mg/0.3 mL IJ SOAJ injection Inject 0.3 mg into the muscle as needed for anaphylaxis. 11/25/22  Yes Newlin, Enobong, MD  famotidine   (PEPCID ) 20 MG tablet Take 1 tablet (20 mg total) by mouth 2 (two) times daily. 03/31/22 09/22/23 Yes Kennyth Domino, FNP  fluticasone  (FLONASE ) 50 MCG/ACT nasal spray Use 2 spray(s) in each nostril once daily Patient taking differently: Place 2 sprays into both nostrils daily. Use 2 spray(s) in each nostril once daily 02/02/21  Yes Newlin, Enobong, MD  mometasone -formoterol  (DULERA ) 200-5 MCG/ACT AERO INHALE 2 PUFFS BY MOUTH IN THE MORNING AND AT BEDTIME 05/26/23  Yes Vicci Barnie NOVAK, MD  naproxen  (NAPROSYN ) 500 MG tablet Take 1 tablet (500 mg total) by mouth 2 (two) times daily. 09/22/23  Yes Tedra Coppernoll, MD  oxyCODONE  (ROXICODONE ) 5 MG immediate release tablet Take 1 tablet (5 mg total) by mouth every 6 (six) hours as needed for breakthrough pain. 09/22/23  Yes Yittel Emrich, MD  Potassium Chloride  ER 20 MEQ TBCR Take 1 tablet (20 mEq total) by mouth daily. 11/25/22  Yes Newlin, Enobong, MD  sertraline  (ZOLOFT ) 100 MG tablet Take 100 mg by mouth daily. 10/08/19  Yes [provider]  topiramate  (TOPAMAX ) 50 MG tablet Take 1 tablet (50 mg total) by mouth daily. 11/25/22  Yes Newlin, Enobong, MD  traZODone  (DESYREL ) 100 MG tablet Take 1 tablet (100 mg total) by mouth at bedtime. 10/14/17  Yes Wilkie Majel RAMAN, FNP  VENTOLIN  HFA 108 (90 Base) MCG/ACT inhaler INHALE 2 PUFFS BY MOUTH EVERY 6 HOURS AS NEEDED FOR WHEEZING OR SHORTNESS OF BREATH 05/26/23  Yes Vicci Barnie NOVAK, MD  Blood Glucose Monitoring Suppl (TRUE METRIX METER) DEVI 1 each by Does not apply route 3 (three) times daily. 04/15/21   Newlin, Enobong, MD  glucose blood (TRUE METRIX BLOOD GLUCOSE TEST) test strip 1 each by Other route 3 (three) times daily. 04/15/21   Newlin, Enobong, MD  Misc. Devices MISC Rolling walker with seat 02/02/21   Delbert Clam, MD  oxyCODONE -acetaminophen  (PERCOCET) 10-325 MG tablet Take 1 tablet by mouth 3 (three) times daily as needed for pain. 10/15/21   [provider]  TRUEplus Lancets 28G  MISC 1 each by Does not apply route 3 (three) times daily. 04/15/21   Delbert Clam, MD                                                                                                                                    Past Surgical History Past Surgical History:  Procedure Laterality Date   CESAREAN SECTION     2   CYSTOSCOPY N/A 10/23/2019   Procedure: CYSTOSCOPY;  Surgeon: Lake Read, MD;  Location: [facility]  ORS;  Service: Gynecology;  Laterality: N/A;   TOTAL LAPAROSCOPIC HYSTERECTOMY WITH SALPINGECTOMY Bilateral 10/23/2019   Procedure: TOTAL LAPAROSCOPIC HYSTERECTOMY CONVERTED TO OPEN SUPRACERVICAL HYSTERECTOMY;  Surgeon: Lake Read, MD;  Location: [facility] ORS;  Service: Gynecology;  Laterality: Bilateral;   TUBAL LIGATION     upper teeth     Family History Family History  Problem Relation Age of Onset   Hypertension Mother    Hypertension Father    Breast cancer Maternal Grandmother    Diabetes Mellitus II Maternal Grandmother    Breast cancer Paternal Grandmother    Lupus Other     Social History Social History   Tobacco Use   Smoking status: Every Day    Current packs/day: 0.25    Types: Cigarettes   Smokeless tobacco: Never  Vaping Use   Vaping status: Every Day  Substance Use Topics   Alcohol use: Yes    Alcohol/week: 1.0 standard drink of alcohol    Types: 1 Cans of beer per week   Drug use: Not Currently    Types: Cocaine    Comment: + cocaine 09/2017   Denies use on 7/21 2021   Allergies Ace inhibitors, Angiotensin receptor blockers, Lisinopril , Losartan , and Peanut oil  Review of Systems Review of Systems  Genitourinary:  Positive for flank pain.    Physical Exam Vital Signs  I have reviewed the triage vital signs BP (!) 175/92 (BP Location: Right Arm)   Pulse 87   Temp 98 F (36.7 C) (Oral)   Resp 19   Ht 5' 4 (1.626 m)   Wt 95.3 kg   LMP 09/27/2019   SpO2 97%   BMI 36.05 kg/m   Physical Exam Vitals and nursing note reviewed.   Constitutional:      General: She is not in acute distress.    Appearance: She is well-developed.  HENT:     Head: Normocephalic and atraumatic.   Eyes:     Conjunctiva/sclera: Conjunctivae normal.    Cardiovascular:     Rate and Rhythm: Normal rate and regular rhythm.     Heart sounds: No murmur heard. Pulmonary:     Effort: Pulmonary effort is normal. No respiratory distress.     Breath sounds: Normal breath sounds.  Abdominal:     Palpations: Abdomen is soft.     Tenderness: There is no abdominal tenderness. There is right CVA tenderness.   Musculoskeletal:        General: No swelling.     Cervical back: Neck supple.   Skin:    General: Skin is warm and dry.     Capillary Refill: Capillary refill takes less than 2 seconds.   Neurological:     Mental Status: She is alert.   Psychiatric:        Mood and Affect: Mood normal.     ED Results and Treatments Labs (all labs ordered are listed, but only abnormal results are displayed) Labs Reviewed  URINALYSIS, ROUTINE W REFLEX MICROSCOPIC - Abnormal; Notable for the following components:      Result Value   Color, Urine AMBER (*)    APPearance HAZY (*)    Hgb urine dipstick SMALL (*)    Protein, ur 100 (*)    All other components within normal limits  BASIC METABOLIC PANEL WITH GFR - Abnormal; Notable for the following components:   Potassium 3.2 (*)    Glucose, Bld 149 (*)    All other components within normal limits  CBC - Abnormal;  Notable for the following components:   WBC 14.0 (*)    RBC 6.28 (*)    Hemoglobin 17.0 (*)    HCT 50.5 (*)    All other components within normal limits                                                                                                                          Radiology CT Renal Stone Study Result Date: 09/22/2023 CLINICAL DATA:  Right flank pain. EXAM: CT ABDOMEN AND PELVIS WITHOUT CONTRAST TECHNIQUE: Multidetector CT imaging of the abdomen and pelvis was performed  following the standard protocol without IV contrast. RADIATION DOSE REDUCTION: This exam was performed according to the departmental dose-optimization program which includes automated exposure control, adjustment of the mA and/or kV according to patient size and/or use of iterative reconstruction technique. COMPARISON:  CT abdomen/pelvis dated 06/25/2019. FINDINGS: Lower chest: No acute abnormality. Hepatobiliary: No focal liver abnormality is seen. No gallstones, gallbladder wall thickening, or biliary dilatation. Pancreas: Unremarkable. No pancreatic ductal dilatation or surrounding inflammatory changes. Spleen: Normal in size without focal abnormality. Adrenals/Urinary Tract: Adrenal glands are unremarkable. Punctate nonobstructive bilateral renal calculi measuring up to 2 mm. No ureteral calculi. No hydronephrosis. Bladder is partially distended and otherwise grossly unremarkable. Stomach/Bowel: Stomach is within normal limits. Appendix appears normal. No evidence of bowel wall thickening, distention, or inflammatory changes. Mild sigmoid colonic diverticulosis without evidence of acute diverticulitis. Vascular/Lymphatic: Abdominal aorta is normal in caliber with atherosclerotic calcification. No enlarged abdominopelvic lymph nodes. Reproductive: Status post hysterectomy. No adnexal masses. Other: No abdominopelvic ascites. No intraperitoneal free air. Postoperative changes along the ventral abdominal wall. No abdominal wall hernia. Musculoskeletal: No acute osseous abnormality. Chronic deformity of the right proximal femur. Degenerative disc disease at L5-S1. IMPRESSION: 1. No acute localizing findings in the abdomen or pelvis. 2. Punctate nonobstructive bilateral renal calculi. No hydronephrosis. 3. Mild sigmoid colonic diverticulosis without evidence of acute diverticulitis. 4.  Aortic Atherosclerosis (ICD10-I70.0). Electronically Signed   By: Harrietta Sherry M.D.   On: 09/22/2023 21:10    Pertinent labs  & imaging results that were available during my care of the patient were reviewed by me and considered in my medical decision making (see MDM for details).  Medications Ordered in ED Medications  morphine  (PF) 4 MG/ML injection 4 mg (4 mg Intravenous Given 09/22/23 2055)  ondansetron  (ZOFRAN ) injection 4 mg (4 mg Intravenous Given 09/22/23 2055)  morphine  (PF) 4 MG/ML injection 6 mg (6 mg Intravenous Given 09/22/23 2129)  ketorolac  (TORADOL ) 15 MG/ML injection 15 mg (15 mg Intravenous Given 09/22/23 2128)  HYDROcodone -acetaminophen  (NORCO/VICODIN) 5-325 MG per tablet 2 tablet (2 tablets Oral Given 09/22/23 2344)  ketorolac  (TORADOL ) 15 MG/ML injection 15 mg (15 mg Intravenous Given 09/22/23 2343)  Procedures Procedures  (including critical care time)  Medical Decision Making / ED Course   This patient presents to the ED for concern of flank pain, this involves an extensive number of treatment options, and is a complaint that carries with it a high risk of complications and morbidity.  The differential diagnosis includes nephrolithiasis, pyelonephritis, obstruction, musculoskeletal strain, vertebral fracture, intra-abdominal abscess, diverticulitis  MDM: Patient seen emergency room for evaluation of flank pain.  Physical exam reveals an uncomfortable appearing patient with right-sided CVA tenderness to outpatient but otherwise unremarkable.  Laboratory evaluation with leukocytosis to 14.0, hemoglobin 17.0, potassium 3.2, urinalysis negative for infection.  CT stone study largely unremarkable.  Patient pain controlled here in the emergency department and on reevaluation symptoms are improving.  At this time she does not meet inpatient criteria for admission and will be discharged with outpatient follow-up.  Strict return precautions given of which she and her husband  voiced understanding.   Additional history obtained: -Additional history obtained from husband -External records from outside source obtained and reviewed including: Chart review including previous notes, labs, imaging, consultation notes   Lab Tests: -I ordered, reviewed, and interpreted labs.   The pertinent results include:   Labs Reviewed  URINALYSIS, ROUTINE W REFLEX MICROSCOPIC - Abnormal; Notable for the following components:      Result Value   Color, Urine AMBER (*)    APPearance HAZY (*)    Hgb urine dipstick SMALL (*)    Protein, ur 100 (*)    All other components within normal limits  BASIC METABOLIC PANEL WITH GFR - Abnormal; Notable for the following components:   Potassium 3.2 (*)    Glucose, Bld 149 (*)    All other components within normal limits  CBC - Abnormal; Notable for the following components:   WBC 14.0 (*)    RBC 6.28 (*)    Hemoglobin 17.0 (*)    HCT 50.5 (*)    All other components within normal limits      Imaging Studies ordered: I ordered imaging studies including CT stone study I independently visualized and interpreted imaging. I agree with the radiologist interpretation   Medicines ordered and prescription drug management: Meds ordered this encounter  Medications   morphine  (PF) 4 MG/ML injection 4 mg    Refill:  0   ondansetron  (ZOFRAN ) injection 4 mg   morphine  (PF) 4 MG/ML injection 6 mg    Refill:  0   ketorolac  (TORADOL ) 15 MG/ML injection 15 mg   HYDROcodone -acetaminophen  (NORCO/VICODIN) 5-325 MG per tablet 2 tablet    Refill:  0   ketorolac  (TORADOL ) 15 MG/ML injection 15 mg   naproxen  (NAPROSYN ) 500 MG tablet    Sig: Take 1 tablet (500 mg total) by mouth 2 (two) times daily.    Dispense:  30 tablet    Refill:  0   acetaminophen  (TYLENOL ) 500 MG tablet    Sig: Take 2 tablets (1,000 mg total) by mouth every 8 (eight) hours.    Dispense:  180 tablet    Refill:  0   oxyCODONE  (ROXICODONE ) 5 MG immediate release tablet     Sig: Take 1 tablet (5 mg total) by mouth every 6 (six) hours as needed for breakthrough pain.    Dispense:  10 tablet    Refill:  0    -I have reviewed the patients home medicines and have made adjustments as needed  Critical interventions none    Cardiac Monitoring: The patient was maintained on  a cardiac monitor.  I personally viewed and interpreted the cardiac monitored which showed an underlying rhythm of: NSr  Social Determinants of Health:  Factors impacting patients care include: none   Reevaluation: After the interventions noted above, I reevaluated the patient and found that they have :improved  Co morbidities that complicate the patient evaluation  Past Medical History:  Diagnosis Date   Asthma    Back pain    Chronic kidney disease    frequent infrections   Depression    Dislocation of hip (HCC)    right hip   GERD (gastroesophageal reflux disease)    Hypertension    Migraines    Pre-diabetes    Sickle cell trait (HCC)       Dispostion: I considered admission for this patient, but at this time she does not meet inpatient criteria for admission and will be discharged with outpatient follow-up     Final Clinical Impression(s) / ED Diagnoses Final diagnoses:  Flank pain     @PCDICTATION @    Ebon Ketchum, Lum, MD 09/23/23 [phone]

## 2023-11-15 ENCOUNTER — Other Ambulatory Visit: Payer: Self-pay | Admitting: Family Medicine

## 2023-11-15 DIAGNOSIS — E7800 Pure hypercholesterolemia, unspecified: Secondary | ICD-10-CM

## 2023-11-15 DIAGNOSIS — I10 Essential (primary) hypertension: Secondary | ICD-10-CM

## 2023-11-15 DIAGNOSIS — M1A9XX0 Chronic gout, unspecified, without tophus (tophi): Secondary | ICD-10-CM

## 2023-11-15 DIAGNOSIS — Z20828 Contact with and (suspected) exposure to other viral communicable diseases: Secondary | ICD-10-CM

## 2023-11-15 DIAGNOSIS — J4531 Mild persistent asthma with (acute) exacerbation: Secondary | ICD-10-CM

## 2023-12-13 ENCOUNTER — Other Ambulatory Visit: Payer: Self-pay | Admitting: Family Medicine

## 2023-12-13 ENCOUNTER — Other Ambulatory Visit: Payer: Self-pay

## 2023-12-13 DIAGNOSIS — Z20828 Contact with and (suspected) exposure to other viral communicable diseases: Secondary | ICD-10-CM

## 2023-12-13 DIAGNOSIS — I10 Essential (primary) hypertension: Secondary | ICD-10-CM

## 2023-12-13 DIAGNOSIS — J4531 Mild persistent asthma with (acute) exacerbation: Secondary | ICD-10-CM

## 2023-12-13 DIAGNOSIS — E7800 Pure hypercholesterolemia, unspecified: Secondary | ICD-10-CM

## 2023-12-13 DIAGNOSIS — M1A9XX0 Chronic gout, unspecified, without tophus (tophi): Secondary | ICD-10-CM

## 2023-12-14 ENCOUNTER — Other Ambulatory Visit: Payer: Self-pay

## 2023-12-19 ENCOUNTER — Other Ambulatory Visit: Payer: Self-pay

## 2023-12-29 ENCOUNTER — Ambulatory Visit: Payer: Self-pay

## 2023-12-29 NOTE — Telephone Encounter (Signed)
 FYI Only or Action Required?: FYI only for provider.  Patient was last seen in primary care on 05/26/2023 by Vicci Barnie NOVAK, MD.  Called Nurse Triage reporting Back Pain.  Symptoms began several days ago.  Interventions attempted: Nothing.  Symptoms are: rapidly worsening.  Triage Disposition: Go to ED Now (Notify PCP)  Patient/caregiver understands and will follow disposition?: Yes     Copied from CRM [id number]. Topic: Clinical - Red Word Triage >> Dec 29, 2023  2:49 PM Shardie S wrote: [facility] that prompted transfer to Nurse Triage: pain in legs and back Reason for Disposition  [1] SEVERE back pain (e.g., excruciating) AND [2] sudden onset AND [3] age > 60 years  Answer Assessment - Initial Assessment Questions 1. ONSET: When did the pain begin? (e.g., minutes, hours, days)     Years ago 2. LOCATION: Where does it hurt? (upper, mid or lower back)     Lower back,  pain in legs hx blood clots states was seen in [place]  and was told she has blood clots in her legs, states not on a blood thinner - states pain is severe - instructed to go to ER now. 3. SEVERITY: How bad is the pain?  (e.g., Scale 1-10; mild, moderate, or severe) severe 4. PATTERN: Is the pain constant? (e.g., yes, no; constant, intermittent)      constant 5. RADIATION: Does the pain shoot into your legs or somewhere else?     na 6. CAUSE:  What do you think is causing the back pain?      na 7. BACK OVERUSE:  Any recent lifting of heavy objects, strenuous work or exercise?     na 8. MEDICINES: What have you taken so far for the pain? (e.g., nothing, acetaminophen , NSAIDS)     na 9. NEUROLOGIC SYMPTOMS: Do you have any weakness, numbness, or problems with bowel/bladder control?     na 10. OTHER SYMPTOMS: Do you have any other symptoms? (e.g., fever, abdomen pain, burning with urination, blood in urine)       na 11. PREGNANCY: Is there any chance you are pregnant? When was your  last menstrual period?       na  Protocols used: Back Pain-A-AH

## 2023-12-29 NOTE — Telephone Encounter (Signed)
 Noted

## 2024-01-09 ENCOUNTER — Other Ambulatory Visit: Payer: Self-pay | Admitting: Family Medicine

## 2024-01-09 DIAGNOSIS — Z20828 Contact with and (suspected) exposure to other viral communicable diseases: Secondary | ICD-10-CM

## 2024-01-09 DIAGNOSIS — E7800 Pure hypercholesterolemia, unspecified: Secondary | ICD-10-CM

## 2024-01-09 DIAGNOSIS — M1A9XX0 Chronic gout, unspecified, without tophus (tophi): Secondary | ICD-10-CM

## 2024-01-09 DIAGNOSIS — J4531 Mild persistent asthma with (acute) exacerbation: Secondary | ICD-10-CM

## 2024-01-09 DIAGNOSIS — I10 Essential (primary) hypertension: Secondary | ICD-10-CM

## 2024-02-06 ENCOUNTER — Other Ambulatory Visit: Payer: Self-pay | Admitting: Family Medicine

## 2024-02-15 ENCOUNTER — Ambulatory Visit: Payer: Self-pay

## 2024-02-15 NOTE — Telephone Encounter (Signed)
 FYI Only or Action Required?: FYI only for provider: ED advised.- states she will go tomorrow.   Patient was last seen in primary care on 05/26/2023 by Vicci Barnie NOVAK, MD.  Called Nurse Triage reporting Back Pain.  Symptoms began several years ago.  Interventions attempted: Nothing.  Symptoms are: gradually worsening.  Triage Disposition: Go to ED Now (or PCP Triage)  Patient/caregiver understands and will follow disposition?: Unsure    Copied from CRM #[id number]. Topic: Clinical - Red Word Triage >> Feb 15, 2024 12:23 PM Shanda MATSU wrote: Red Word that prompted transfer to Nurse Triage: Patient is reporting hip pain to the point where some days she is not able to walk. Reason for Disposition  Weakness of a leg or foot (e.g., unable to bear weight, dragging foot)  Answer Assessment - Initial Assessment Questions Pt states she used to see pain management but she was separating from her husband and had to save up for a car. So she couldn't go to pain management because she didn't have transportation. She states that they got rid of her pain contract. She states that since then her lower back pain has been radiating to her right hip and now into her left hip. She states she now has to use a walker or a cane to make it even short distances because of the pain. RN asked about weakness she said yes she has weakness in both legs and feet, which is getting progressively worse over the last 2 months. States she was in the ER about a month ago because they thought she had a blood clot but ruled it out. RN advised due to the weakness in both legs to go to the ER to rule out spinal cord issues. Pt states I'll go tomorrow.     1. ONSET: When did the pain begin? (e.g., minutes, hours, days)     Ongoing  2. LOCATION: Where does it hurt? (upper, mid or lower back)     Lower back radiating to both hips 3. SEVERITY: How bad is the pain?  (e.g., Scale 1-10; mild, moderate, or severe)     10  (then states over a ten) 4. PATTERN: Is the pain constant? (e.g., yes, no; constant, intermittent)      constant 5. RADIATION: Does the pain shoot into your legs or somewhere else?     Both hips 6. CAUSE:  What do you think is causing the back pain?      States she was told she needs surgery.  7. NEUROLOGIC SYMPTOMS: Do you have any weakness, numbness, or problems with bowel/bladder control?     Weakness down both legs. Still able to control bowel and bladder 8. OTHER SYMPTOMS: Do you have any other symptoms? (e.g., fever, abdomen pain, burning with urination, blood in urine)       Denies any other symptoms.  Protocols used: Back Pain-A-AH

## 2024-02-20 ENCOUNTER — Telehealth: Payer: Self-pay | Admitting: Family Medicine

## 2024-02-20 ENCOUNTER — Ambulatory Visit: Payer: MEDICAID | Admitting: Internal Medicine

## 2024-02-20 NOTE — Telephone Encounter (Signed)
 Contacted patient, No answer. Unable to leave VM.

## 2024-04-03 ENCOUNTER — Telehealth: Payer: Self-pay | Admitting: Family Medicine

## 2024-04-03 DIAGNOSIS — Z760 Encounter for issue of repeat prescription: Secondary | ICD-10-CM

## 2024-04-03 DIAGNOSIS — J4531 Mild persistent asthma with (acute) exacerbation: Secondary | ICD-10-CM

## 2024-04-03 DIAGNOSIS — I10 Essential (primary) hypertension: Secondary | ICD-10-CM

## 2024-04-03 DIAGNOSIS — Z20828 Contact with and (suspected) exposure to other viral communicable diseases: Secondary | ICD-10-CM

## 2024-04-03 DIAGNOSIS — M1A9XX0 Chronic gout, unspecified, without tophus (tophi): Secondary | ICD-10-CM

## 2024-04-03 DIAGNOSIS — E7800 Pure hypercholesterolemia, unspecified: Secondary | ICD-10-CM

## 2024-04-03 NOTE — Telephone Encounter (Signed)
 Routing to PCP for review.

## 2024-04-03 NOTE — Telephone Encounter (Unsigned)
 Copied from CRM [id number]. Topic: Clinical - Medication Refill >> Apr 03, 2024  2:03 PM Antony RAMAN wrote: Medication: asking for refills for all her meds until her refill appointment, did not say specific names just kept saying all from dr newlin  Has the patient contacted their pharmacy? Yes (Agent: If no, request that the patient contact the pharmacy for the refill. If patient does not wish to contact the pharmacy document the reason why and proceed with request.) (Agent: If yes, when and what did the pharmacy advise?)  This is the patient's preferred pharmacy:   [facility] [address] [place], [place] - [address], [address] [place] [address] Phone: [phone] Fax: [phone]  Is this the correct pharmacy for this prescription? Yes If no, delete pharmacy and type the correct one.   Has the prescription been filled recently? No  Is the patient out of the medication? Yes  Has the patient been seen for an appointment in the last year OR does the patient have an upcoming appointment? Yes  Can we respond through MyChart? Yes  Agent: Please be advised that Rx refills may take up to 3 business days. We ask that you follow-up with your pharmacy.

## 2024-04-04 MED ORDER — TRULICITY 3 MG/0.5ML ~~LOC~~ SOAJ: 3.0000 mg | SUBCUTANEOUS | 1 refills | Status: AC

## 2024-04-04 MED ORDER — ALLOPURINOL 100 MG PO TABS: 300.0000 mg | ORAL_TABLET | Freq: Every day | ORAL | 0 refills | Status: DC

## 2024-04-04 MED ORDER — POTASSIUM CHLORIDE CRYS ER 20 MEQ PO TBCR: 20.0000 meq | EXTENDED_RELEASE_TABLET | Freq: Every day | ORAL | 0 refills | Status: AC

## 2024-04-04 MED ORDER — ALBUTEROL SULFATE HFA 108 (90 BASE) MCG/ACT IN AERS: INHALATION_SPRAY | RESPIRATORY_TRACT | 0 refills | Status: DC

## 2024-04-04 MED ORDER — DULERA 200-5 MCG/ACT IN AERO: INHALATION_SPRAY | RESPIRATORY_TRACT | 0 refills | Status: DC

## 2024-04-04 MED ORDER — ATORVASTATIN CALCIUM 20 MG PO TABS: 20.0000 mg | ORAL_TABLET | Freq: Every day | ORAL | 0 refills | Status: AC

## 2024-04-04 MED ORDER — TOPIRAMATE 50 MG PO TABS: 50.0000 mg | ORAL_TABLET | Freq: Every day | ORAL | 1 refills | Status: AC

## 2024-04-04 MED ORDER — DAPAGLIFLOZIN PROPANEDIOL 5 MG PO TABS: 5.0000 mg | ORAL_TABLET | Freq: Every day | ORAL | 0 refills | Status: AC

## 2024-04-04 MED ORDER — AMLODIPINE BESYLATE 10 MG PO TABS: 10.0000 mg | ORAL_TABLET | Freq: Every day | ORAL | 0 refills | Status: AC

## 2024-04-04 NOTE — Telephone Encounter (Signed)
 Refills have been sent to pharmacy

## 2024-04-05 NOTE — Telephone Encounter (Signed)
 Patient was called and informed that medications has been sent to pharmacy.

## 2024-05-04 ENCOUNTER — Other Ambulatory Visit: Payer: Self-pay | Admitting: Family Medicine

## 2024-05-04 DIAGNOSIS — J4531 Mild persistent asthma with (acute) exacerbation: Secondary | ICD-10-CM

## 2024-05-04 DIAGNOSIS — M1A9XX0 Chronic gout, unspecified, without tophus (tophi): Secondary | ICD-10-CM

## 2024-05-04 DIAGNOSIS — Z20828 Contact with and (suspected) exposure to other viral communicable diseases: Secondary | ICD-10-CM

## 2024-05-16 ENCOUNTER — Ambulatory Visit: Payer: Self-pay | Admitting: Family Medicine
